# Patient Record
Sex: Male | Born: 1935 | Race: White | Hispanic: No | Marital: Married | Smoking: Never smoker
Health system: Southern US, Community
[De-identification: ages and names within clinical notes are randomized; demographics above are authoritative.]

## PROBLEM LIST (undated history)

## (undated) DIAGNOSIS — E162 Hypoglycemia, unspecified: Secondary | ICD-10-CM

## (undated) DIAGNOSIS — D649 Anemia, unspecified: Secondary | ICD-10-CM

## (undated) DIAGNOSIS — I639 Cerebral infarction, unspecified: Secondary | ICD-10-CM

## (undated) DIAGNOSIS — I313 Pericardial effusion (noninflammatory): Secondary | ICD-10-CM

## (undated) DIAGNOSIS — I739 Peripheral vascular disease, unspecified: Secondary | ICD-10-CM

## (undated) DIAGNOSIS — I10 Essential (primary) hypertension: Secondary | ICD-10-CM

## (undated) DIAGNOSIS — N183 Chronic kidney disease, stage 3 (moderate): Secondary | ICD-10-CM

## (undated) DIAGNOSIS — I3139 Other pericardial effusion (noninflammatory): Secondary | ICD-10-CM

## (undated) DIAGNOSIS — E119 Type 2 diabetes mellitus without complications: Secondary | ICD-10-CM

## (undated) HISTORY — DX: Pericardial effusion (noninflammatory): I31.3

## (undated) HISTORY — PX: LEG SURGERY: SHX1003

## (undated) HISTORY — DX: Peripheral vascular disease, unspecified: I73.9

## (undated) HISTORY — DX: Other pericardial effusion (noninflammatory): I31.39

## (undated) HISTORY — PX: FOOT SURGERY: SHX648

## (undated) SURGERY — Surgical Case
Anesthesia: *Unknown

---

## 2007-05-30 ENCOUNTER — Emergency Department (HOSPITAL_COMMUNITY): Admission: EM | Admit: 2007-05-30 | Discharge: 2007-05-30 | Payer: Self-pay | Admitting: Emergency Medicine

## 2010-02-04 ENCOUNTER — Encounter: Admission: RE | Admit: 2010-02-04 | Discharge: 2010-02-04 | Payer: Self-pay | Admitting: Cardiovascular Disease

## 2010-02-10 ENCOUNTER — Inpatient Hospital Stay (HOSPITAL_COMMUNITY): Admission: RE | Admit: 2010-02-10 | Discharge: 2010-02-11 | Payer: Self-pay | Admitting: Cardiovascular Disease

## 2010-06-14 LAB — CBC
MCHC: 33.5 g/dL (ref 30.0–36.0)
Platelets: 142 10*3/uL — ABNORMAL LOW (ref 150–400)
RDW: 14 % (ref 11.5–15.5)
WBC: 7.8 10*3/uL (ref 4.0–10.5)

## 2010-06-14 LAB — BASIC METABOLIC PANEL
BUN: 9 mg/dL (ref 6–23)
Calcium: 8.4 mg/dL (ref 8.4–10.5)
Creatinine, Ser: 1.12 mg/dL (ref 0.4–1.5)
GFR calc non Af Amer: >60 mL/min (ref >60)
Glucose, Bld: 122 mg/dL — ABNORMAL HIGH (ref 70–99)

## 2010-06-14 LAB — GLUCOSE, CAPILLARY
Glucose-Capillary: 114 mg/dL — ABNORMAL HIGH (ref 70–99)
Glucose-Capillary: 122 mg/dL — ABNORMAL HIGH (ref 70–99)
Glucose-Capillary: 130 mg/dL — ABNORMAL HIGH (ref 70–99)
Glucose-Capillary: 133 mg/dL — ABNORMAL HIGH (ref 70–99)
Glucose-Capillary: 158 mg/dL — ABNORMAL HIGH (ref 70–99)

## 2010-06-23 ENCOUNTER — Other Ambulatory Visit: Payer: Self-pay | Admitting: Podiatry

## 2010-06-23 DIAGNOSIS — M869 Osteomyelitis, unspecified: Secondary | ICD-10-CM

## 2010-06-29 ENCOUNTER — Encounter (HOSPITAL_COMMUNITY)
Admission: RE | Admit: 2010-06-29 | Discharge: 2010-06-29 | Disposition: A | Discharge status: Home / Self Care | Payer: Medicare Other | Source: Ambulatory Visit | Attending: Podiatry | Admitting: Podiatry

## 2010-06-29 ENCOUNTER — Ambulatory Visit (HOSPITAL_COMMUNITY): Payer: Medicare Other

## 2010-06-29 DIAGNOSIS — M869 Osteomyelitis, unspecified: Secondary | ICD-10-CM

## 2010-06-30 ENCOUNTER — Emergency Department (HOSPITAL_COMMUNITY): Payer: Medicare Other

## 2010-06-30 ENCOUNTER — Inpatient Hospital Stay (HOSPITAL_COMMUNITY)
Admission: EM | Admit: 2010-06-30 | Discharge: 2010-07-10 | DRG: 982 | Disposition: A | Discharge status: Home Health | Payer: Medicare Other | Attending: Family Medicine | Admitting: Family Medicine

## 2010-06-30 DIAGNOSIS — Z8673 Personal history of transient ischemic attack (TIA), and cerebral infarction without residual deficits: Secondary | ICD-10-CM

## 2010-06-30 DIAGNOSIS — Q211 Atrial septal defect: Secondary | ICD-10-CM

## 2010-06-30 DIAGNOSIS — E669 Obesity, unspecified: Secondary | ICD-10-CM | POA: Diagnosis present

## 2010-06-30 DIAGNOSIS — I728 Aneurysm of other specified arteries: Secondary | ICD-10-CM | POA: Diagnosis present

## 2010-06-30 DIAGNOSIS — I771 Stricture of artery: Secondary | ICD-10-CM | POA: Diagnosis present

## 2010-06-30 DIAGNOSIS — D509 Iron deficiency anemia, unspecified: Secondary | ICD-10-CM | POA: Diagnosis present

## 2010-06-30 DIAGNOSIS — S92009A Unspecified fracture of unspecified calcaneus, initial encounter for closed fracture: Secondary | ICD-10-CM | POA: Diagnosis present

## 2010-06-30 DIAGNOSIS — I129 Hypertensive chronic kidney disease with stage 1 through stage 4 chronic kidney disease, or unspecified chronic kidney disease: Secondary | ICD-10-CM | POA: Diagnosis present

## 2010-06-30 DIAGNOSIS — E1149 Type 2 diabetes mellitus with other diabetic neurological complication: Secondary | ICD-10-CM | POA: Diagnosis present

## 2010-06-30 DIAGNOSIS — L97409 Non-pressure chronic ulcer of unspecified heel and midfoot with unspecified severity: Secondary | ICD-10-CM | POA: Diagnosis present

## 2010-06-30 DIAGNOSIS — E785 Hyperlipidemia, unspecified: Secondary | ICD-10-CM | POA: Diagnosis present

## 2010-06-30 DIAGNOSIS — E7800 Pure hypercholesterolemia, unspecified: Secondary | ICD-10-CM | POA: Diagnosis present

## 2010-06-30 DIAGNOSIS — I6340 Cerebral infarction due to embolism of unspecified cerebral artery: Principal | ICD-10-CM | POA: Diagnosis present

## 2010-06-30 DIAGNOSIS — N181 Chronic kidney disease, stage 1: Secondary | ICD-10-CM | POA: Diagnosis present

## 2010-06-30 DIAGNOSIS — Z87891 Personal history of nicotine dependence: Secondary | ICD-10-CM

## 2010-06-30 DIAGNOSIS — Z7982 Long term (current) use of aspirin: Secondary | ICD-10-CM

## 2010-06-30 DIAGNOSIS — E1142 Type 2 diabetes mellitus with diabetic polyneuropathy: Secondary | ICD-10-CM | POA: Diagnosis present

## 2010-06-30 DIAGNOSIS — E1169 Type 2 diabetes mellitus with other specified complication: Secondary | ICD-10-CM | POA: Diagnosis not present

## 2010-06-30 DIAGNOSIS — E1159 Type 2 diabetes mellitus with other circulatory complications: Secondary | ICD-10-CM | POA: Diagnosis present

## 2010-06-30 DIAGNOSIS — L02619 Cutaneous abscess of unspecified foot: Secondary | ICD-10-CM | POA: Diagnosis present

## 2010-06-30 DIAGNOSIS — Q2111 Secundum atrial septal defect: Secondary | ICD-10-CM

## 2010-06-30 DIAGNOSIS — X58XXXA Exposure to other specified factors, initial encounter: Secondary | ICD-10-CM | POA: Diagnosis present

## 2010-06-30 DIAGNOSIS — F1011 Alcohol abuse, in remission: Secondary | ICD-10-CM | POA: Diagnosis present

## 2010-06-30 LAB — COMPREHENSIVE METABOLIC PANEL
ALT: 25 U/L (ref 0–53)
AST: 30 U/L (ref 0–37)
Albumin: 2.6 g/dL — ABNORMAL LOW (ref 3.5–5.2)
Calcium: 9.4 mg/dL (ref 8.4–10.5)
GFR calc Af Amer: 51 mL/min — ABNORMAL LOW (ref >60)
Glucose, Bld: 147 mg/dL — ABNORMAL HIGH (ref 70–99)
Potassium: 4.3 mEq/L (ref 3.5–5.1)
Sodium: 135 mEq/L (ref 135–145)
Total Protein: 7.7 g/dL (ref 6.0–8.3)

## 2010-06-30 LAB — DIFFERENTIAL
Basophils Absolute: 0 10*3/uL (ref 0.0–0.1)
Eosinophils Absolute: 0.1 10*3/uL (ref 0.0–0.7)
Eosinophils Relative: 1 % (ref 0–5)
Eosinophils Relative: 1 % (ref 0–5)
Lymphocytes Relative: 15 % (ref 12–46)
Lymphocytes Relative: 14 % (ref 12–46)
Lymphs Abs: 1.4 10*3/uL (ref 0.7–4.0)
Monocytes Absolute: 0.7 10*3/uL (ref 0.1–1.0)
Monocytes Absolute: 0.9 10*3/uL (ref 0.1–1.0)
Monocytes Relative: 7 % (ref 3–12)
Monocytes Relative: 8 % (ref 3–12)
Neutro Abs: 8.2 10*3/uL — ABNORMAL HIGH (ref 1.7–7.7)

## 2010-06-30 LAB — CBC
HCT: 32.7 % — ABNORMAL LOW (ref 39.0–52.0)
HCT: 32.6 % — ABNORMAL LOW (ref 39.0–52.0)
Hemoglobin: 11.2 g/dL — ABNORMAL LOW (ref 13.0–17.0)
MCH: 27.5 pg (ref 26.0–34.0)
MCHC: 32.4 g/dL (ref 30.0–36.0)
MCHC: 34.4 g/dL (ref 30.0–36.0)
MCV: 80.1 fL (ref 78.0–100.0)
MCV: 79.9 fL (ref 78.0–100.0)
Platelets: 280 10*3/uL (ref 150–400)
Platelets: 288 10*3/uL (ref 150–400)
RDW: 13.5 % (ref 11.5–15.5)
RDW: 13.5 % (ref 11.5–15.5)
WBC: 9.6 10*3/uL (ref 4.0–10.5)

## 2010-06-30 LAB — URINALYSIS, ROUTINE W REFLEX MICROSCOPIC
Bilirubin Urine: NEGATIVE
Hgb urine dipstick: NEGATIVE
Nitrite: NEGATIVE
Specific Gravity, Urine: 1.016 (ref 1.005–1.030)
pH: 5 (ref 5.0–8.0)

## 2010-06-30 LAB — POCT CARDIAC MARKERS: CKMB, poc: <1 ng/mL — ABNORMAL LOW (ref 1.0–8.0)

## 2010-06-30 LAB — GLUCOSE, CAPILLARY: Glucose-Capillary: 124 mg/dL — ABNORMAL HIGH (ref 70–99)

## 2010-06-30 LAB — CK TOTAL AND CKMB (NOT AT ARMC)
CK, MB: 0.8 ng/mL (ref 0.3–4.0)
Relative Index: INVALID (ref 0.0–2.5)
Total CK: 15 U/L (ref 7–232)

## 2010-06-30 LAB — TROPONIN I: Troponin I: 0.01 ng/mL (ref 0.00–0.06)

## 2010-07-01 ENCOUNTER — Inpatient Hospital Stay (HOSPITAL_COMMUNITY): Payer: Medicare Other

## 2010-07-01 DIAGNOSIS — M7989 Other specified soft tissue disorders: Secondary | ICD-10-CM

## 2010-07-01 LAB — BASIC METABOLIC PANEL
CO2: 25 mEq/L (ref 19–32)
Calcium: 8.9 mg/dL (ref 8.4–10.5)
GFR calc Af Amer: >60 mL/min (ref >60)
GFR calc non Af Amer: 50 mL/min — ABNORMAL LOW (ref >60)
Sodium: 138 mEq/L (ref 135–145)

## 2010-07-01 LAB — SEDIMENTATION RATE: Sed Rate: 109 mm/hr — ABNORMAL HIGH (ref 0–16)

## 2010-07-01 LAB — URINE CULTURE: Culture: NO GROWTH

## 2010-07-01 LAB — GLUCOSE, CAPILLARY
Glucose-Capillary: 125 mg/dL — ABNORMAL HIGH (ref 70–99)
Glucose-Capillary: 103 mg/dL — ABNORMAL HIGH (ref 70–99)
Glucose-Capillary: 116 mg/dL — ABNORMAL HIGH (ref 70–99)

## 2010-07-01 LAB — CBC
HCT: 28.7 % — ABNORMAL LOW (ref 39.0–52.0)
Platelets: 252 10*3/uL (ref 150–400)
RDW: 13.7 % (ref 11.5–15.5)
WBC: 8.5 10*3/uL (ref 4.0–10.5)

## 2010-07-01 LAB — HEMOGLOBIN A1C: Mean Plasma Glucose: 128 mg/dL — ABNORMAL HIGH (ref <117)

## 2010-07-01 LAB — SURGICAL PCR SCREEN: Staphylococcus aureus: NEGATIVE

## 2010-07-01 NOTE — H&P (Signed)
NAME:  JARRIUS, HUARACHA NO.:  [id number]  MEDICAL RECORD NO.:  [id number]           PATIENT TYPE:  E  LOCATION:  [facility]                         FACILITY:  [facility]  PHYSICIAN:  Zannie Cove, MD     DATE OF BIRTH:  1933-10-03  DATE OF ADMISSION:  06/30/2010 DATE OF DISCHARGE:                             HISTORY & PHYSICAL   PRIMARY CARE PHYSICIAN:  Unknown at this point, unable to recall.  CHIEF COMPLAINT:  Sent by podiatrist office for admission due to worsening of his foot ulcer and swelling.  HISTORY OF PRESENT ILLNESS:  Mr. Mott is a 77 year old diabetic male with peripheral vascular disease who has had a stent in his SFA in November 2011 by Dr. Allyson Sabal who was followed by his podiatrist, Dr. Wynelle Cleveland.  For his left foot heel ulcer, went to see his podiatrist today for regular followup, and he was found to have worsening of the swelling as well as erythema and edema in his foot as well as skin tears and peeling of the overlying skin and hence sent to the ER for further evaluation and management.  In the ER, he apparently got a dose of fentanyl 25 mcg, then shortly soon after was noted to be acutely confused which prompted the ER to call the code stroke.  He also had a CT head which was unremarkable and CBG which was normal as well.  He was seen by Dr. Thad Ranger from Neurology who did not feel that he was having a stroke and rather than it was more of a global confusion.  I do feel that it is most likely related to his IV fentanyl since his mentation has continued to improve with time that he has spent in the ER now.  The patient does report he is still little confused but able to provide some history at this point.  He reports that he has had increasing swelling and pain in his left foot for the last 2 days.  He denies fevers, chills.  He denies history of trauma.  He denies any other complaints at this point in time.  He says that he has been treated with  antibiotics off and on for his left foot by his podiatrist.  He was supposed to have an x-ray done couple of days ago, but due to some technical difficulty with the machine this had to be postponed.  Past medical history is significant for: 1. Type 2 diabetes. 2. Peripheral vascular disease status post two SFA stents in November     2011. 3. Hypertension. 4. History of TIA in the past. 5. History of nonischemic Myoview in October 2011. 6. History of chronic left foot ulcer.  Medications include metformin.  Rest of his medications currently unknown.  Allergies include CODEINE.  SOCIAL HISTORY:  He is married, lives at home with his wife.  Used to drink alcohol regularly up until 15 years ago and smoked one pack per day until 15 years ago.  FAMILY HISTORY:  The patient unable to recall any significant family history at this point.  REVIEW OF SYSTEMS:  Negative except per HPI.  PHYSICAL EXAMINATION:  VITAL SIGNS:  Temperature is 98.1, pulse 95, blood pressure 152/75, respirations 20, sating 100% on 2 L. GENERAL:  He is an elderly Caucasian gentleman laying in the stretcher in no acute distress. HEENT:  Pupils 4 mm bilaterally reactive to light.  Oral mucosa is moist and pink. NECK:  No JVD or lymphadenopathy. CARDIOVASCULAR:  S1 and S2.  Regular rate and rhythm. LUNGS:  Poor air entry bilaterally and decreased breath sounds at bases. ABDOMEN:  Obese, soft, nontender.  Positive bowel sounds.  No organomegaly. EXTREMITIES:  Left lower extremity 2+ edema.  There is erythema overlying the dorsum of the foot.  There is a small less than 1 cm round ulcer on his heel without overt signs of purulence and there is peeling of his skin posterior aspect of his heels.  Dorsalis pedis pulsations are decreased.  I am unable to appreciate this due to excessive overlying edema.  Posterior tibial pulses are present but decreased.  On his right foot, dorsalis pedis pulsations are decreased as  well including posterior tibials. NEUROLOGIC:  Currently, cranial nerves II through XII grossly intact. Motor strength 4/5 in all extremities.  Light touch intact.  Plantars are 2+ in biceps as well as knee, and reflexes are downgoing. Coordination and gait is not assessed.  ASSESSMENT AND PLAN:  Mr. Behney is a 77 year old gentleman with: 1. Worsening cellulitis of the left foot with swelling, rule out     osteomyelitis. 2. Altered mental status and confusion likely related to IV narcotic     administered in the emergency department, currently resolving. 3. Type 2 diabetes. 4. History of peripheral vascular disease status post superficial     femoral artery stents x2.  PLAN: 1. We will start him on IV vancomycin due to the fact that he is     diabetic.  We will also put him on Zosyn to cover for Gram-     negatives and anaerobes.  We will get an MRI of his left foot to     rule out osteomyelitis, and due to the profound edema in his left     leg I will also check Dopplers to rule out DVT. 2. For his diabetes, we will put him on a sliding scale insulin, check     hemoglobin A1c.  We would be rather conservative with his pain     medicines due to his severe reaction in the ED today.  Further     management as condition evolves.     Zannie Cove, MD     PJ/MEDQ  D:  06/30/2010  T:  06/30/2010  Job:  [id number]  cc:   Tinnie Gens A. Petrinitz, D.P.M.  Electronically Signed by Zannie Cove  on 07/01/2010 03:38:07 PM

## 2010-07-02 ENCOUNTER — Inpatient Hospital Stay (HOSPITAL_COMMUNITY): Payer: Medicare Other

## 2010-07-02 LAB — CBC
HCT: 28.6 % — ABNORMAL LOW (ref 39.0–52.0)
MCV: 80.6 fL (ref 78.0–100.0)
Platelets: 249 10*3/uL (ref 150–400)
RBC: 3.55 MIL/uL — ABNORMAL LOW (ref 4.22–5.81)
WBC: 7.4 10*3/uL (ref 4.0–10.5)

## 2010-07-02 LAB — GLUCOSE, CAPILLARY
Glucose-Capillary: 135 mg/dL — ABNORMAL HIGH (ref 70–99)
Glucose-Capillary: 60 mg/dL — ABNORMAL LOW (ref 70–99)
Glucose-Capillary: 96 mg/dL (ref 70–99)

## 2010-07-02 LAB — DIFFERENTIAL
Basophils Absolute: 0 10*3/uL (ref 0.0–0.1)
Lymphocytes Relative: 22 % (ref 12–46)
Lymphs Abs: 1.6 10*3/uL (ref 0.7–4.0)
Neutrophils Relative %: 68 % (ref 43–77)

## 2010-07-02 LAB — LIPID PANEL: VLDL: 12 mg/dL (ref 0–40)

## 2010-07-02 LAB — BASIC METABOLIC PANEL
Calcium: 8.9 mg/dL (ref 8.4–10.5)
GFR calc Af Amer: 56 mL/min — ABNORMAL LOW (ref >60)
GFR calc non Af Amer: 46 mL/min — ABNORMAL LOW (ref >60)
Sodium: 137 mEq/L (ref 135–145)

## 2010-07-02 MED ORDER — GADOBENATE DIMEGLUMINE 529 MG/ML IV SOLN
20.0000 mL | Freq: Once | INTRAVENOUS | Status: AC
  Administered 2010-07-02: 20 mL via INTRAVENOUS

## 2010-07-03 ENCOUNTER — Inpatient Hospital Stay (HOSPITAL_COMMUNITY): Payer: Medicare Other

## 2010-07-03 ENCOUNTER — Other Ambulatory Visit (HOSPITAL_COMMUNITY): Payer: Medicare Other

## 2010-07-03 LAB — DIFFERENTIAL
Eosinophils Absolute: 0.2 10*3/uL (ref 0.0–0.7)
Eosinophils Relative: 4 % (ref 0–5)
Lymphs Abs: 1.5 10*3/uL (ref 0.7–4.0)
Monocytes Relative: 8 % (ref 3–12)

## 2010-07-03 LAB — BASIC METABOLIC PANEL
BUN: 18 mg/dL (ref 6–23)
Chloride: 104 mEq/L (ref 96–112)
Creatinine, Ser: 1.48 mg/dL (ref 0.4–1.5)

## 2010-07-03 LAB — CBC
MCH: 26.7 pg (ref 26.0–34.0)
MCV: 81.1 fL (ref 78.0–100.0)
Platelets: 223 10*3/uL (ref 150–400)
RBC: 3.6 MIL/uL — ABNORMAL LOW (ref 4.22–5.81)

## 2010-07-03 LAB — GLUCOSE, CAPILLARY
Glucose-Capillary: 85 mg/dL (ref 70–99)
Glucose-Capillary: 149 mg/dL — ABNORMAL HIGH (ref 70–99)
Glucose-Capillary: 131 mg/dL — ABNORMAL HIGH (ref 70–99)
Glucose-Capillary: 109 mg/dL — ABNORMAL HIGH (ref 70–99)

## 2010-07-03 LAB — VANCOMYCIN, TROUGH: Vancomycin Tr: 18.7 ug/mL (ref 10.0–20.0)

## 2010-07-03 MED ORDER — GADOBENATE DIMEGLUMINE 529 MG/ML IV SOLN
20.0000 mL | Freq: Once | INTRAVENOUS | Status: AC
  Administered 2010-07-03: 20 mL via INTRAVENOUS

## 2010-07-04 ENCOUNTER — Inpatient Hospital Stay (HOSPITAL_COMMUNITY): Payer: Medicare Other

## 2010-07-04 DIAGNOSIS — I6350 Cerebral infarction due to unspecified occlusion or stenosis of unspecified cerebral artery: Secondary | ICD-10-CM

## 2010-07-04 LAB — GLUCOSE, CAPILLARY
Glucose-Capillary: 118 mg/dL — ABNORMAL HIGH (ref 70–99)
Glucose-Capillary: 135 mg/dL — ABNORMAL HIGH (ref 70–99)

## 2010-07-04 MED ORDER — GADOBENATE DIMEGLUMINE 529 MG/ML IV SOLN
20.0000 mL | Freq: Once | INTRAVENOUS | Status: AC
  Administered 2010-07-04: 20 mL via INTRAVENOUS

## 2010-07-05 LAB — BASIC METABOLIC PANEL
BUN: 18 mg/dL (ref 6–23)
Calcium: 8.5 mg/dL (ref 8.4–10.5)
Chloride: 104 mEq/L (ref 96–112)
Creatinine, Ser: 1.42 mg/dL (ref 0.4–1.5)
GFR calc Af Amer: 59 mL/min — ABNORMAL LOW (ref >60)

## 2010-07-05 LAB — GLUCOSE, CAPILLARY

## 2010-07-05 LAB — CBC
MCH: 27 pg (ref 26.0–34.0)
MCHC: 33.2 g/dL (ref 30.0–36.0)
MCV: 81.4 fL (ref 78.0–100.0)
Platelets: 230 10*3/uL (ref 150–400)
RBC: 3.66 MIL/uL — ABNORMAL LOW (ref 4.22–5.81)

## 2010-07-05 NOTE — Consult Note (Signed)
NAME:  [name], [name] NO.:  [id number]  MEDICAL RECORD NO.:  [id number]           PATIENT TYPE:  I  LOCATION:  3017                         FACILITY:  [facility]  PHYSICIAN:  Thana Farr, MD    DATE OF BIRTH:  [birth year]  DATE OF CONSULTATION:  06/30/2010 DATE OF DISCHARGE:                                CONSULTATION   HISTORY:  Mr. [name] is a 77 year old male with a history of stroke in the past who was triaged to the ED earlier today secondary to some wound issues.  Was last seen normal at approximately 5 o'clock.  On next evaluation in the ED, he was noted to have mental status changes.  The patient was confused.  Code stroke was called at that time.  PAST MEDICAL HISTORY:  Diabetes.  MEDICATIONS:  Metformin, questionable Plavix, and aspirin.  ALLERGIES:  CODEINE.  SOCIAL HISTORY:  Unknown.  PHYSICAL EXAMINATION:  GENERAL:  Blood pressure 152/75, heart rate 110, respiratory rate 22, temperature 98.1, O2 sat 100% on room air. NEUROLOGIC:  On mental status testing, the patient is alert.  He is lying in the bed with his eyes closed.  He does not follow commands.  He has one to two word answers to questions.  On many occasions, his answers are not appropriate to the question being asked. On cranial nerve testing II, the patient would not allow his disks to be visualized.  III, IV, VI:  Extraocular movements grossly intact.  V and VII, face symmetric.  VIII grossly intact.  IX and X, positive gag.  XI unable to test.  XII unable to test. On motor testing, the patient moves all extremities although not to command.  He is able to move all extremities against gravity. On sensory testing, the patient responds to noxious stimuli in all extremities.  Deep tendon reflexes are 1+ in the upper extremities and absent in the lower extremities.  Plantars are mute on the left and upgoing on the right.  LABORATORY TESTING:  White blood cell count of 9.6, platelet  count of 260, hemoglobin and hematocrit 10.6 and 32.7 respectively.  Glucose 156.  CT shows old lacunar changes.  No evidence of acute changes.  These films have been reviewed.  ASSESSMENT:  Mr. [name] is a 77 year old male admitted with acute mental status changes.  Although stroke is on the differential, I am not convinced at this time that this patient has had a stroke.  There is no focality on his exam.  Due to some question whether the patient has actually had a stroke, he will not be administered tissue plasminogen activator.  His NIH stroke scale is a 5.  Further workup is indicated.  PLAN: 1. MRI of the brain without contrast. 2. Echo and carotids. 3. It is unclear if the patient is on an anticoagulant.  There is some     information that the patient may be on aspirin and Plavix.  If it     is determined that he is on aspirin and Plavix, we will continue     that at this time.  If he is not on anticoagulant,  we will start     aspirin prophylaxis for now.  Unable to get any accurate     information from the patient at this time.          ______________________________ Thana Farr, MD  Patient is critically ill and requires high level decision making for patient care.     LR/MEDQ  D:  06/30/2010  T:  07/01/2010  Job:  [id number]  Electronically Signed by Thana Farr MD on 07/05/2010 07:00:31 PM

## 2010-07-06 DIAGNOSIS — M79609 Pain in unspecified limb: Secondary | ICD-10-CM

## 2010-07-06 LAB — COMPREHENSIVE METABOLIC PANEL
ALT: 34 U/L (ref 0–53)
AST: 28 U/L (ref 0–37)
Albumin: 2.5 g/dL — ABNORMAL LOW (ref 3.5–5.2)
Alkaline Phosphatase: 67 U/L (ref 39–117)
Potassium: 3.9 mEq/L (ref 3.5–5.1)
Sodium: 140 mEq/L (ref 135–145)
Total Protein: 7 g/dL (ref 6.0–8.3)

## 2010-07-06 LAB — CBC
Platelets: 236 10*3/uL (ref 150–400)
RBC: 3.83 MIL/uL — ABNORMAL LOW (ref 4.22–5.81)
RDW: 14.3 % (ref 11.5–15.5)
WBC: 6.3 10*3/uL (ref 4.0–10.5)

## 2010-07-06 LAB — POCT I-STAT 4, (NA,K, GLUC, HGB,HCT)
Glucose, Bld: 110 mg/dL — ABNORMAL HIGH (ref 70–99)
HCT: 30 % — ABNORMAL LOW (ref 39.0–52.0)
Hemoglobin: 10.2 g/dL — ABNORMAL LOW (ref 13.0–17.0)

## 2010-07-06 LAB — GLUCOSE, CAPILLARY
Glucose-Capillary: 106 mg/dL — ABNORMAL HIGH (ref 70–99)
Glucose-Capillary: 137 mg/dL — ABNORMAL HIGH (ref 70–99)

## 2010-07-06 LAB — HOMOCYSTEINE: Homocysteine: 13.8 umol/L (ref 4.0–15.4)

## 2010-07-07 LAB — CBC
HCT: 30.4 % — ABNORMAL LOW (ref 39.0–52.0)
MCHC: 32.6 g/dL (ref 30.0–36.0)
MCV: 81.1 fL (ref 78.0–100.0)
RDW: 14.4 % (ref 11.5–15.5)
WBC: 6.1 10*3/uL (ref 4.0–10.5)

## 2010-07-07 LAB — BASIC METABOLIC PANEL
BUN: 17 mg/dL (ref 6–23)
GFR calc non Af Amer: 39 mL/min — ABNORMAL LOW (ref >60)
Glucose, Bld: 110 mg/dL — ABNORMAL HIGH (ref 70–99)
Potassium: 3.8 mEq/L (ref 3.5–5.1)

## 2010-07-07 LAB — PROTEIN S ACTIVITY: Protein S Activity: 114 % (ref 69–129)

## 2010-07-07 LAB — PROTEIN C, TOTAL: Protein C, Total: 118 % (ref 72–160)

## 2010-07-07 LAB — PROTHROMBIN GENE MUTATION

## 2010-07-07 LAB — GLUCOSE, CAPILLARY: Glucose-Capillary: 162 mg/dL — ABNORMAL HIGH (ref 70–99)

## 2010-07-07 LAB — PROTEIN C ACTIVITY: Protein C Activity: 146 % — ABNORMAL HIGH (ref 75–133)

## 2010-07-07 LAB — PROTEIN S, TOTAL: Protein S Ag, Total: 139 % (ref 60–150)

## 2010-07-07 LAB — ABO/RH: ABO/RH(D): O POS

## 2010-07-07 LAB — LUPUS ANTICOAGULANT PANEL: Lupus Anticoagulant: NOT DETECTED

## 2010-07-08 LAB — COMPREHENSIVE METABOLIC PANEL
Albumin: 2.4 g/dL — ABNORMAL LOW (ref 3.5–5.2)
BUN: 20 mg/dL (ref 6–23)
Creatinine, Ser: 1.64 mg/dL — ABNORMAL HIGH (ref 0.4–1.5)
Glucose, Bld: 122 mg/dL — ABNORMAL HIGH (ref 70–99)
Total Protein: 6.2 g/dL (ref 6.0–8.3)

## 2010-07-08 LAB — BETA-2-GLYCOPROTEIN I ABS, IGG/M/A
Beta-2-Glycoprotein I IgA: 7 A Units (ref <20)
Beta-2-Glycoprotein I IgM: 5 M Units (ref <20)

## 2010-07-08 LAB — CBC
HCT: 29.9 % — ABNORMAL LOW (ref 39.0–52.0)
MCH: 26.6 pg (ref 26.0–34.0)
MCHC: 32.4 g/dL (ref 30.0–36.0)
MCV: 82.1 fL (ref 78.0–100.0)
Platelets: 217 10*3/uL (ref 150–400)
RDW: 14.6 % (ref 11.5–15.5)
WBC: 6.1 10*3/uL (ref 4.0–10.5)

## 2010-07-08 LAB — IRON AND TIBC
Iron: 45 ug/dL (ref 42–135)
Saturation Ratios: 23 % (ref 20–55)
TIBC: 193 ug/dL — ABNORMAL LOW (ref 215–435)

## 2010-07-08 LAB — GLUCOSE, CAPILLARY: Glucose-Capillary: 172 mg/dL — ABNORMAL HIGH (ref 70–99)

## 2010-07-08 LAB — CARDIOLIPIN ANTIBODIES, IGG, IGM, IGA
Anticardiolipin IgA: 12 APL U/mL — ABNORMAL LOW (ref <22)
Anticardiolipin IgM: 3 MPL U/mL — ABNORMAL LOW (ref <11)

## 2010-07-08 LAB — VITAMIN B12: Vitamin B-12: 385 pg/mL (ref 211–911)

## 2010-07-08 LAB — FERRITIN: Ferritin: 228 ng/mL (ref 22–322)

## 2010-07-09 LAB — CBC
HCT: 29.1 % — ABNORMAL LOW (ref 39.0–52.0)
Hemoglobin: 9.6 g/dL — ABNORMAL LOW (ref 13.0–17.0)
MCH: 27 pg (ref 26.0–34.0)
MCHC: 33 g/dL (ref 30.0–36.0)
RDW: 14.7 % (ref 11.5–15.5)

## 2010-07-09 LAB — GLUCOSE, CAPILLARY
Glucose-Capillary: 126 mg/dL — ABNORMAL HIGH (ref 70–99)
Glucose-Capillary: 114 mg/dL — ABNORMAL HIGH (ref 70–99)

## 2010-07-09 LAB — HEMOCCULT GUIAC POC 1CARD (OFFICE)
Fecal Occult Bld: NEGATIVE
Fecal Occult Bld: NEGATIVE

## 2010-07-10 LAB — CROSSMATCH: Unit division: 0

## 2010-07-10 LAB — CULTURE, BLOOD (ROUTINE X 2)
Culture  Setup Time: 201204022347
Culture: NO GROWTH

## 2010-07-10 LAB — GLUCOSE, CAPILLARY: Glucose-Capillary: 119 mg/dL — ABNORMAL HIGH (ref 70–99)

## 2010-07-10 NOTE — Op Note (Signed)
  NAME:  [name], [name]             ACCOUNT NO.:  [id number]  MEDICAL RECORD NO.:  [id number]           PATIENT TYPE:  I  LOCATION:  3034                         FACILITY:  [facility]  PHYSICIAN:  Nadara Mustard, MD     DATE OF BIRTH:  Jun 13, 1933  DATE OF PROCEDURE:  07/08/2010 DATE OF DISCHARGE:                              OPERATIVE REPORT   PREOPERATIVE DIAGNOSIS:  Left calcaneus fracture.  POSTOPERATIVE DIAGNOSIS:  Left calcaneus fracture.  PROCEDURE:  Open reduction and internal fixation of left calcaneus.  SURGEON:  Nadara Mustard, MD  ANESTHESIA:  Popliteal block.  ESTIMATED BLOOD LOSS:  Minimal.  ANTIBIOTICS:  Vancomycin and Zosyn preoperatively.  DRAINS:  None.  COMPLICATIONS:  None.  TOURNIQUET TIME:  None.  DISPOSITION:  To PACU in stable condition.  INDICATION FOR PROCEDURE:  The patient is a 77 year old gentleman, diabetic insensate neuropathy, status post a tongue-type fracture with avulsion of the tibial tuberosity with elevation.  The patient has redness with pending skin ulceration.  The fracture was not reduced. The patient was scheduled for surgery last week.  He was unresponsive. He has undergone prolonged medical workup and is felt to be cleared for surgery at this time by the medical team.  The patient was not allowed to be operated on prior to this time.  Risks and benefits were discussed including infection, neurovascular injury, ischemia of the skin over the os calcis, potential for amputation.  The patient states he understands and wished to proceed at this time.  DESCRIPTION OF PROCEDURE:  The patient was brought to OR room #4 and underwent a popliteal block.  After adequate level of anesthesia was obtained, the patient was placed in the right lateral decubitus position with the left side up and left lower extremity was prepped using DuraPrep and draped into sterile field.  A percutaneous hole was made superior to the os calcis using a ball-tip  reduction tool.  The elevated os calcis was reduced.  Two crossing K-wires were then used to stabilize the reduction.  C-arm fluoroscopy verified the reduction at this time. Over the K-wires, two 7.3 cannulated screws were then used to stabilize reduction.  The posterior facet was reduced.  The wounds were closed using 2-0 nylon.  There was no tension on the skin.  The wound was covered with Adaptic orthopedic sponges, Kerlix, and Coban.  The patient was then taken to PACU in stable condition.     Nadara Mustard, MD     MVD/MEDQ  D:  07/08/2010  T:  07/09/2010  Job:  [id number]  Electronically Signed by Aldean Baker MD on 07/10/2010 08:13:05 PM

## 2010-07-11 NOTE — Discharge Summary (Signed)
NAME:  [name], [name]             ACCOUNT NO.:  [id number]  MEDICAL RECORD NO.:  [id number]           PATIENT TYPE:  I  LOCATION:  3034                         FACILITY:  [facility]  PHYSICIAN:  Pleas Koch, MD        DATE OF BIRTH:  May 08, 1933  DATE OF ADMISSION:  06/30/2010 DATE OF DISCHARGE:  07/10/2010                              DISCHARGE SUMMARY   Please see admission H&P [id number] dated June 30, 2010.  DISCHARGE DIAGNOSES: 1. Calcaneal fracture. 2. 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 3. Left heel ulcer. 4. Impaired fasting glucose of A1c 6.1. 5. History of peripheral vascular disease. 6. Microcytic anemia. 7. Hyperlipidemia. 8. CKD stage I.  DISCHARGE MEDICATIONS:  Are as follows. 1. The patient is to continue Diovan/HCTZ 160/12.5 daily. 2. Metformin 500 one tablets b.i.d. 3. Amlodipine 10 mg daily as new medication. 4. Simvastatin 10 mg at bedtime new medications. 5. Hydrocodone/APAP 5/325 1 tablet q. 6 p.r.n. limited prescription 5     days given with 20 tablets. 6. Aspirin 325 mg daily lifelong. 7. Clopidogrel 75 mg 1 tablet daily with meals lifelong. 8. Amaryl 4 mg 1 tablet daily.  PERTINENT CONSULTS:  While in hospital: 1. Neurology, Dr. Thad Ranger and Dr. Pearlean Brownie 2. Dr. Lynnea Ferrier. 3. Dr. Aldean Baker of Orthopedics.  PERTINENT IMAGING STUDIES: 1. Two-view chest x-ray initially on day of admission showed no acute     cardiopulmonary process. 2. CT head, June 30, 2010, showed atrophic and chronic vascular     ischemia.  No acute abnormality. 3. CT head without contrast, July 02, 2010, showed stable exam of     atrophy and chronic small vessel ischemic change.  No acute     intracranial abnormalities found. 4. Foot x-ray, July 01, 2010, showed posterior calcaneal fractures     intraarticular extension posterior subtalar joint. 5. Dorsal mid and forefoot soft tissue swelling without evidence of     fracture or osteomyelitis in  the region. 6. MRI brain with and without contrast on July 02, 2010, showed     ectatic distal cervical segment and petrous segment of the right     vertebral artery. 7. Proximal left internal carotid artery with 48% diameter stenosis,     small outpouching representing ulcerations, left proximal internal     carotid artery, right internal carotid with long segment stenosis     with 56% narrowing with 2 areas which may represent focal     ulcerations. 8. Moderate-to-marked focal narrowing proximal aspect of vertebral     arteries bilaterally.  This is common for artifactual loss of     signal although this may be true stenosis in the region which may     be overestimated by present exam.  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 9. MRI of the brain showed spinal stenosis C3-C4 partially, empty     sella, remote hemispheric infarction with involvement of portions     of the frontal lobes bilaterally,  left parietal lobe, remote small     thalamic infarct. 10.Several remote small bilateral cerebellum infarct, one of which is     partially hemorrhagic right cerebellum.  No other intracranial     hemorrhage. 11.Scattered small acute nonhemorrhagic infarcts involving portions of     the occipital lobes bilaterally and mid-to-superior cerebellums     bilaterally. 12.MRI of foot July 03, 2010 showed calcaneal fracture with     considerable edema and enhancements through calcaneus.     Enhancements noting some geographical lack of enhancement along the     fracture joints could be due to healing response, but atypical     feature such as fluid from the fracture apparently extended the     scan, punctate low signal foci within the core enhanced portion of     calcaneus raised my suspicion for superimposed osteomyelitis and     possible open fracture. 13.MRI of tibia-fibular July 03, 2010, showed fatty replacement     muscular of the posterior compartment of left lower leg. 14.Subcutaneous edema in the calves may be incidental given the lack     of associated significant __________ and does not entirely reflects     cellulitis. 15.A 2-D echocardiogram July 04, 2010 showed EF 65% with no regional     wall motion abnormalities.  Doppler parameters consistent with     abnormal left ventricular aspiration grade 1 diastolic dysfunction.     Transesophageal ultrasound showed left ventricular systolic     function 65%.  No regional wall motion abnormalities.  Left atrium,     no evidence of thrombus and atrial cavity or appendage.  Right     atrium, no evidence of thrombus in the atrial cavity or appendage,     atrial septum.  Agitated saline contrast study showed a small right-     to-left shunt through a patent foramen ovale in the baseline study.  HOSPITAL COURSE:  According to history, please see full dictation as dictated.  Briefly this is a 77 year old diabetic male peripheral vascular disease and is SFA by Dr. Audie Pinto, followed by doctor podiatrist, Dr. Wynelle Cleveland.  Per his referral, he went to see podiatrist for regular follow up, found to have worsening swelling and erythema as well as skin tears and pealing of the overlying skin and came to the ED.  The patient was found to be confused subsequent to being in the ED although the patient had received fentanyl.  The patient was seen by Neurology who did not feel he was having stroke and thought it was more global confusion.  The patient does report he is little bit more confused, but was able to provide some history and states he had increasing swelling left foot over the past 2 days.  No fever, no chills, and no trauma.  He has been treated on and off antibiotics by his podiatrist.  He was supposed to have x-ray done couple days ago, but due to technical difficulty he had to have this postponed.  The  patient does have a TIA history in the past with peripheral vascular disease.  ADMISSION VITAL SIGNS:  Temperature 98.1, pulse 95, blood pressure 150/75, respirations 20, satting 100% on room air. HEENT:  Pupils 4-mm, bilaterally reactive to light. RESPIRATORY:  Air entry was bilaterally, decreased with decreased breath sounds. EXTREMITIES:  There is a small less than 1 cm round ulcer on heel without oversize purulence and there is peeling of skin posterior aspect of heel.  Dorsalis pedis are decreased.  It was difficult to appreciate initially pulse because of excessive overlying edema.  Right foot dorsalis pedis are decreased as well including posterior tibialis. Cranial nerves initially grossly intact.  Muscle strength 4/5 in all extremities.  Light touch was intact.  Plantars are 2+ in biceps and other deep tendon reflexes are normal.  Coordination and gait are not assessed.  HOSPITAL COURSE ACCORDING TO ISSUE: 1. Possible cellulitis.  The patient was initially placed on     vancomycin and Zosyn for empiric coverage and subsequently was     reviewed by orthopedics.  It was felt that the patient likely may     have a calcaneal fracture with overlying cellulitis and hence he     was kept on antibiotics as per imaging findings above.  The patient     went to OR on two episodes, but developed some intermittent     confusion.  This had been cancelled twice.  Subsequently on July 08, 2010, Dr. Lajoyce Corners was able to do an ORIF of left calcaneus which     the patient tolerated well.  The patient will follow up with Dr.     Lajoyce Corners in about 1 week in the outpatient setting. 2. Possible history of embolic CVA.  Dr. Pearlean Brownie was consulted regarding     this.  He recommended and various studies were done for his     intermittent confusion.  He had an EEG that showed diffuse gray     matter disturbance that is etiologically nonspecific, but may     include dementia and other possibilities.  For the  PFO, according     to his assessment and plan as the patient did not have any lower     extremity deep vein thrombosis which may explain paradoxical     emboli.  He recommended full strength aspirin and the patient will     also be on Plavix.  The patient does also carry a history of     peripheral vascular disease and this is felt to be appropriate on     discharge.  Dr. Pearlean Brownie did also state that the patient should follow     up with Dr. Allyson Sabal and may benefit from possible subclavian     angioplasty and stenting in the future secondary to subclavian     stenosis as dictated above. 3. Diabetes mellitus.  The patient's A1c was well-controlled at 6.1.     He was not kept on insulin while in hospital, the patient will     continue his Amaryl and metformin at above doses as p.r.n. on     discharge. 4. History of peripheral vascular disease.  The patient is status post     stenting, status post two SFA stents, November 2011 and will need     to follow up with Dr. Allyson Sabal once again for this. 5. Anemia.  The patient had an microcytic anemia during     hospitalization which remained stable.  Fecal occult blood was     done, which was negative.  He had an anemia panel which showed a     decreased total iron binding capacity only.  This is likely anemia,     most probably blood loss. 6. Hypercholesterolemia.  The patient was placed on a statin.  His LDL     was 82.  His HDL was 25.  The patient may benefit from an exercise  program. 7. Possible hypercoagulable state causing cerebrovascular accident.     The patient had a relatively high protein C level, but on     discussion with Dr. Pearlean Brownie, this was not something that would likely     be the causative factor for the stroke. 8. Questionable cellulitis.  I discuss the patient personally with Dr.     Lupe Carney who did not think that the patient required empiric     oral antibiotic coverage and in fact stated that this was more     likely  secondary to fracture overlying edema rather than actual     cellulitis.  As such, I have discontinued all antibiotics and the     patient will go home without antibiotic coverage as this is more     likely secondary to the fracture than cellulitis.  DISPOSITION:  The patient will be discharged home with home health PT/OT as the patient was able to ambulate and toe touch weightbearing per orthopedics.  The patient was able to ambulate well without difficulty and go to bathroom and toilet himself.  The patient will need follow up with his primary care physician probably in about 1 week and let him know what went on in the hospital.  His podiatrist is Dr. Wynelle Cleveland who sent him over here.  The patient was reviewed on day of discharge.  The patient was discharged in a stable state.  Temperature 97.7, pulse 71, respirations 16, blood pressure 99-133/61-68.  The patient was satting 98% on room air.  It was pleasure taking care of this patient in the hospital.          ______________________________ Pleas Koch, MD     JS/MEDQ  D:  07/10/2010  T:  07/10/2010  Job:  [id number]  cc:   Pramod P. Pearlean Brownie, MD Ritta Slot, MD Nadara Mustard, MD Eugenio Hoes. Petrinitz, D.P.M. Audie Pinto, M.D.  Electronically Signed by Pleas Koch MD on 07/11/2010 06:15:32 AM

## 2010-07-13 NOTE — Discharge Summary (Signed)
  NAME:  CURTEZ, BRALLIER NO.:  [id number]  MEDICAL RECORD NO.:  [id number]           PATIENT TYPE:  LOCATION:                                 FACILITY:  PHYSICIAN:  Pleas Koch, MD        DATE OF BIRTH:  Jun 22, 1933  DATE OF ADMISSION: DATE OF DISCHARGE:                              DISCHARGE SUMMARY   ADDENDUM:  The patient was discharged home in med records on Plavix 75 mg one daily with meals, and it should be noted that the patient is allergic Plavix, and did experience some itching and discomfort with the same, hence he was discontinued off, and discharged home only on 325 mg of aspirin.          ______________________________ Pleas Koch, MD     JS/MEDQ  D:  07/10/2010  T:  07/10/2010  Job:  [id number]  Electronically Signed by Pleas Koch MD on 07/13/2010 07:31:12 AM

## 2010-07-26 NOTE — Consult Note (Signed)
NAME:  [name], [name]             ACCOUNT NO.:  [id number]  MEDICAL RECORD NO.:  [id number]           PATIENT TYPE:  LOCATION:                                 FACILITY:  PHYSICIAN:  Pramod P. Pearlean Brownie, MD    DATE OF BIRTH:  18-Jul-1933  DATE OF CONSULTATION: DATE OF DISCHARGE:                                CONSULTATION   REFERRING PHYSICIAN:  [facility].  REASON FOR REFERRAL:  Stroke.  HISTORY OF PRESENT ILLNESS:  [name] is a 77 year old Caucasian gentleman who was admitted on June 30, 2010, with sudden onset of confusion while he was for a routine followup visit with his podiatrist when he was seen for left foot pain, swelling, and cellulitis following surgery.  The patient was seen in the emergency room and a code stroke by Dr. Thana Farr, neurologist on-call, who thought the patient's symptoms were nonfocal and probably related to medication like fentanyl and he was not given thrombolysis with TPA.  The patient's condition has improved somewhat.  Admission CT scan of the head showed small bilateral lacunar infarcts in cerebellum with changes of small vessel disease without acute abnormality; however, an MRI scan was obtained on July 02, 2010, which shows multiple tiny scattered nonhemorrhagic infarcts involving both occipital lobes and mid to superior cerebellum bilaterally as well as several small remote age cerebellar infarcts and old right frontal left parietal infarcts as well.  MRA of the brain shows mild ectasia of the carotid arteries without high-grade stenosis. 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  The patient has had no further recurrent confusion and near stroke like symptoms.  On inquiry today states that a week ago he had an episode of sudden onset of dizziness, mild  vertigo, as well as right upper extremity numbness and weakness which lasted only for 10 minutes and cleared completely, did not seek medical help at that time.  He himself denies any prior history of strokes even though his CT and MRI reveal multiple small lacunar infarcts bilaterally.  His past medical history is significant for severe peripheral vascular disease status post femoral stents in November 2011 by Dr. Allyson Sabal. Diabetes, hypertension, history of TIAs in the past, chronic left foot ulcer.  MEDICATIONS:  At home, metformin.  In the hospital, Lovenox, hydrochlorothiazide, Benicar, Zosyn, Zocor.  SOCIAL HISTORY:  The patient is married, lives with his wife at home. He used to drink alcohol, quit 15 years ago.  No smoking.  REVIEW OF SYSTEMS:  Documented above in history present illness.  PHYSICAL EXAMINATION:  GENERAL:  Obese middle-aged Caucasian gentleman currently not in distress who is lying comfortably in the bed.  He has swelling in the left foot with left foot brace that he is wearing. VITAL SIGNS:  He is afebrile, temperature 97.9, blood pressure 150/68, pulse rate 75 per minute and regular, respiratory rate 16 per minute, distal pulses are well felt. HEENT:  Head is nontraumatic.  Hearing appears normal. NECK:  Supple without bruit.  CARDIAC:  No murmur or gallop. LUNGS:  Clear to auscultation. ABDOMEN:  Soft, nontender. NEUROLOGIC:  He is awake, alert, oriented to time, place, and person. He has intact speech and language but diminished recall, 0/3.  He is able to name only five animals.  Speech is, however, fluent.  Eye movements are full range without nystagmus.  He blinks to threat bilaterally.  There is no facial weakness.  Tongue is midline.  Motor system exam reveals no upper or lower extremity drift, symmetric and equal strength in all four extremities.  Deep tendon reflexes are 2+ in the upper extremities, 1+ in the knees, absent in the ankles.   Both plantars are downgoing.  Touch pinprick sensation appears preserved except distally in both feet.  Coordination is accurate.  Gait was not tested.  NIH stroke scale now is zero.  MRI scan of the brain as stated above shows multiple tiny small nonhemorrhagic infarcts involving both superior cerebellum and occipital lobes.  Lipid profile shows total cholesterol of 119, triglycerides 62, HDL 25, LDL 82.  Hemoglobin A1c is 6.1.  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  Etiology to be determined, but given the fact that he has chronic nonhealing foot ulcer endocarditis is a possibility.  He also has ulcerative disease in the left subclavian artery with poor stenotic dilatation that could also possibly serve as a source of embolization to the posterior circulation and will need further evaluation.  PLAN:  The patient has a chronic nonhealing ulcer with possible osteomyelitis in the left foot for which he needs emergent surgery.  He may undergo the surgery, but there is an increased risk of having hemorrhagic transformation and extending the stroke during the surgery.I would recommend avoiding hypertension if blood pressure ranging 120- 140 systolic range and the possibly avoid general anesthesia and do surgery under spinal or local anesthesia.  Start aspirin after surgery for secondary stroke prevention.  Transesophageal echocardiogram for endocarditis.  No cardiac source of embolism and subclavian catheter angiogram by Dr. Allyson Sabal to consider subclavian angioplasty and stenting in the future.  Kindly call for questions.     Pramod P. Pearlean Brownie, MD     PPS/MEDQ  D:  07/04/2010  T:  07/05/2010  Job:  [id number]  Electronically Signed by Delia Heady MD on 07/26/2010 11:25:10 AM
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
## 2012-07-25 ENCOUNTER — Emergency Department (HOSPITAL_COMMUNITY): Payer: Medicare Other

## 2012-07-25 ENCOUNTER — Encounter (HOSPITAL_COMMUNITY): Payer: Self-pay | Admitting: Emergency Medicine

## 2012-07-25 ENCOUNTER — Inpatient Hospital Stay (HOSPITAL_COMMUNITY)
Admission: EM | Admit: 2012-07-25 | Discharge: 2012-07-27 | DRG: 639 | Disposition: A | Discharge status: Home / Self Care | Payer: Medicare Other | Attending: Family Medicine | Admitting: Family Medicine

## 2012-07-25 DIAGNOSIS — R404 Transient alteration of awareness: Secondary | ICD-10-CM | POA: Diagnosis present

## 2012-07-25 DIAGNOSIS — Z8673 Personal history of transient ischemic attack (TIA), and cerebral infarction without residual deficits: Secondary | ICD-10-CM

## 2012-07-25 DIAGNOSIS — Z7982 Long term (current) use of aspirin: Secondary | ICD-10-CM

## 2012-07-25 DIAGNOSIS — Z9181 History of falling: Secondary | ICD-10-CM

## 2012-07-25 DIAGNOSIS — E1169 Type 2 diabetes mellitus with other specified complication: Principal | ICD-10-CM | POA: Diagnosis present

## 2012-07-25 DIAGNOSIS — E119 Type 2 diabetes mellitus without complications: Secondary | ICD-10-CM | POA: Diagnosis present

## 2012-07-25 DIAGNOSIS — I129 Hypertensive chronic kidney disease with stage 1 through stage 4 chronic kidney disease, or unspecified chronic kidney disease: Secondary | ICD-10-CM | POA: Diagnosis present

## 2012-07-25 DIAGNOSIS — N179 Acute kidney failure, unspecified: Secondary | ICD-10-CM | POA: Diagnosis present

## 2012-07-25 DIAGNOSIS — R4189 Other symptoms and signs involving cognitive functions and awareness: Secondary | ICD-10-CM | POA: Diagnosis present

## 2012-07-25 DIAGNOSIS — R748 Abnormal levels of other serum enzymes: Secondary | ICD-10-CM | POA: Diagnosis present

## 2012-07-25 DIAGNOSIS — I10 Essential (primary) hypertension: Secondary | ICD-10-CM | POA: Diagnosis present

## 2012-07-25 DIAGNOSIS — Z79899 Other long term (current) drug therapy: Secondary | ICD-10-CM

## 2012-07-25 DIAGNOSIS — E162 Hypoglycemia, unspecified: Secondary | ICD-10-CM | POA: Diagnosis present

## 2012-07-25 DIAGNOSIS — N189 Chronic kidney disease, unspecified: Secondary | ICD-10-CM

## 2012-07-25 DIAGNOSIS — R7989 Other specified abnormal findings of blood chemistry: Secondary | ICD-10-CM | POA: Diagnosis present

## 2012-07-25 HISTORY — DX: Essential (primary) hypertension: I10

## 2012-07-25 HISTORY — DX: Type 2 diabetes mellitus without complications: E11.9

## 2012-07-25 LAB — POCT I-STAT TROPONIN I

## 2012-07-25 LAB — CBC WITH DIFFERENTIAL/PLATELET
Basophils Relative: 0 % (ref 0–1)
Hemoglobin: 10.7 g/dL — ABNORMAL LOW (ref 13.0–17.0)
Lymphs Abs: 0.8 10*3/uL (ref 0.7–4.0)
MCHC: 35.7 g/dL (ref 30.0–36.0)
Monocytes Relative: 6 % (ref 3–12)
Neutro Abs: 5 10*3/uL (ref 1.7–7.7)
Neutrophils Relative %: 81 % — ABNORMAL HIGH (ref 43–77)
Platelets: 134 10*3/uL — ABNORMAL LOW (ref 150–400)
RBC: 3.63 MIL/uL — ABNORMAL LOW (ref 4.22–5.81)

## 2012-07-25 LAB — COMPREHENSIVE METABOLIC PANEL
ALT: 21 U/L (ref 0–53)
Albumin: 2.9 g/dL — ABNORMAL LOW (ref 3.5–5.2)
Alkaline Phosphatase: 88 U/L (ref 39–117)
BUN: 40 mg/dL — ABNORMAL HIGH (ref 6–23)
Chloride: 106 mEq/L (ref 96–112)
Potassium: 3.9 mEq/L (ref 3.5–5.1)
Sodium: 136 mEq/L (ref 135–145)
Total Bilirubin: 0.3 mg/dL (ref 0.3–1.2)

## 2012-07-25 LAB — URINE MICROSCOPIC-ADD ON

## 2012-07-25 LAB — GLUCOSE, CAPILLARY
Glucose-Capillary: 20 mg/dL — CL (ref 70–99)
Glucose-Capillary: 56 mg/dL — ABNORMAL LOW (ref 70–99)

## 2012-07-25 LAB — URINALYSIS, ROUTINE W REFLEX MICROSCOPIC
Glucose, UA: NEGATIVE mg/dL
Specific Gravity, Urine: 1.013 (ref 1.005–1.030)
pH: 5 (ref 5.0–8.0)

## 2012-07-25 MED ORDER — DEXTROSE 50 % IV SOLN
50.0000 mL | Freq: Once | INTRAVENOUS | Status: AC
  Administered 2012-07-25: 50 mL via INTRAVENOUS

## 2012-07-25 MED ORDER — DEXTROSE 50 % IV SOLN
INTRAVENOUS | Status: AC
  Filled 2012-07-25: qty 50

## 2012-07-25 MED ORDER — DEXTROSE 10 % IV SOLN
INTRAVENOUS | Status: DC
  Administered 2012-07-25: 21:00:00 via INTRAVENOUS

## 2012-07-25 MED ORDER — DEXTROSE 50 % IV SOLN
INTRAVENOUS | Status: AC
  Administered 2012-07-25: 50 mL via INTRAVENOUS
  Filled 2012-07-25: qty 50

## 2012-07-25 MED ORDER — ONDANSETRON 4 MG PO TBDP
4.0000 mg | ORAL_TABLET | Freq: Once | ORAL | Status: AC
  Administered 2012-07-25: 4 mg via ORAL
  Filled 2012-07-25: qty 1

## 2012-07-25 MED ORDER — DEXTROSE 10 % NICU IV FLUID BOLUS: 2.0000 mL/kg | INJECTION | Freq: Once | INTRAVENOUS | Status: DC

## 2012-07-25 MED ORDER — DEXTROSE 50 % IV SOLN
1.0000 | Freq: Once | INTRAVENOUS | Status: AC
  Administered 2012-07-25: 50 mL via INTRAVENOUS

## 2012-07-25 NOTE — ED Notes (Signed)
Patient's CBG 56, amp of D50 Given, meal given.  Patient a&ox4, denies pain.  Will continue to monitor.

## 2012-07-25 NOTE — ED Notes (Signed)
Patient from home via [facility] with hypoglycemia and a fall.  Per EMS patient fell and blood sugar was initially 37.  Per paramedic family aided patient to ground and alert and oriented but lethargic on arrival. Patient has a laceration to right elbow.   EMS started D10 in the field, patient received a total of 250 mL en route and CBG is currently 108.   Patient is stable at this time and A&Ox4.  Will continue to monitor.
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
## 2012-07-25 NOTE — ED Provider Notes (Signed)
8:16 PM  Date: 07/25/2012  Rate: 105  Rhythm: sinus tachycardia  QRS Axis: normal  Intervals: normal  ST/T Wave abnormalities: ST depressions laterally  Conduction Disutrbances:none  Narrative Interpretation: Borderline EKG  Old EKG Reviewed: none available    Carleene Cooper III, MD 07/25/12 2017

## 2012-07-26 DIAGNOSIS — E162 Hypoglycemia, unspecified: Secondary | ICD-10-CM

## 2012-07-26 DIAGNOSIS — R404 Transient alteration of awareness: Secondary | ICD-10-CM | POA: Diagnosis present

## 2012-07-26 DIAGNOSIS — E119 Type 2 diabetes mellitus without complications: Secondary | ICD-10-CM

## 2012-07-26 DIAGNOSIS — R4189 Other symptoms and signs involving cognitive functions and awareness: Secondary | ICD-10-CM | POA: Diagnosis present

## 2012-07-26 DIAGNOSIS — N189 Chronic kidney disease, unspecified: Secondary | ICD-10-CM

## 2012-07-26 DIAGNOSIS — R7989 Other specified abnormal findings of blood chemistry: Secondary | ICD-10-CM

## 2012-07-26 DIAGNOSIS — Z9181 History of falling: Secondary | ICD-10-CM

## 2012-07-26 DIAGNOSIS — I10 Essential (primary) hypertension: Secondary | ICD-10-CM

## 2012-07-26 DIAGNOSIS — I319 Disease of pericardium, unspecified: Secondary | ICD-10-CM

## 2012-07-26 HISTORY — DX: Hypoglycemia, unspecified: E16.2

## 2012-07-26 LAB — CBC
HCT: 29.8 % — ABNORMAL LOW (ref 39.0–52.0)
MCHC: 34.9 g/dL (ref 30.0–36.0)
Platelets: 115 10*3/uL — ABNORMAL LOW (ref 150–400)
RDW: 13.8 % (ref 11.5–15.5)
WBC: 6.5 10*3/uL (ref 4.0–10.5)

## 2012-07-26 LAB — GLUCOSE, CAPILLARY
Glucose-Capillary: 104 mg/dL — ABNORMAL HIGH (ref 70–99)
Glucose-Capillary: 50 mg/dL — ABNORMAL LOW (ref 70–99)
Glucose-Capillary: 53 mg/dL — ABNORMAL LOW (ref 70–99)
Glucose-Capillary: 65 mg/dL — ABNORMAL LOW (ref 70–99)
Glucose-Capillary: 44 mg/dL — CL (ref 70–99)
Glucose-Capillary: 71 mg/dL (ref 70–99)
Glucose-Capillary: 62 mg/dL — ABNORMAL LOW (ref 70–99)
Glucose-Capillary: 89 mg/dL (ref 70–99)
Glucose-Capillary: 126 mg/dL — ABNORMAL HIGH (ref 70–99)

## 2012-07-26 LAB — TROPONIN I
Troponin I: 0.61 ng/mL (ref <0.30)
Troponin I: 0.6 ng/mL (ref <0.30)
Troponin I: 0.34 ng/mL (ref <0.30)

## 2012-07-26 LAB — BASIC METABOLIC PANEL
BUN: 37 mg/dL — ABNORMAL HIGH (ref 6–23)
Calcium: 8.5 mg/dL (ref 8.4–10.5)
Creatinine, Ser: 1.87 mg/dL — ABNORMAL HIGH (ref 0.50–1.35)
GFR calc Af Amer: 39 mL/min — ABNORMAL LOW (ref >90)
GFR calc non Af Amer: 33 mL/min — ABNORMAL LOW (ref >90)

## 2012-07-26 MED ORDER — ONDANSETRON HCL 4 MG/2ML IJ SOLN: 4.0000 mg | Freq: Once | INTRAMUSCULAR | Status: AC

## 2012-07-26 MED ORDER — ONDANSETRON HCL 4 MG/2ML IJ SOLN
INTRAMUSCULAR | Status: AC
  Administered 2012-07-26: 4 mg via INTRAVENOUS
  Filled 2012-07-26: qty 2

## 2012-07-26 MED ORDER — DEXTROSE 50 % IV SOLN
1.0000 | Freq: Once | INTRAVENOUS | Status: AC
  Administered 2012-07-26: 50 mL via INTRAVENOUS
  Filled 2012-07-26: qty 50

## 2012-07-26 MED ORDER — ADULT MULTIVITAMIN W/MINERALS CH
1.0000 | ORAL_TABLET | Freq: Every day | ORAL | Status: DC
  Administered 2012-07-26 – 2012-07-27 (×2): 1 via ORAL
  Filled 2012-07-26 (×2): qty 1

## 2012-07-26 MED ORDER — DEXTROSE 10 % IV SOLN
INTRAVENOUS | Status: DC
  Administered 2012-07-26 (×2): via INTRAVENOUS
  Administered 2012-07-27: 1000 mL via INTRAVENOUS

## 2012-07-26 MED ORDER — ZOLPIDEM TARTRATE 5 MG PO TABS
5.0000 mg | ORAL_TABLET | Freq: Once | ORAL | Status: AC
  Administered 2012-07-27: 5 mg via ORAL
  Filled 2012-07-26: qty 1

## 2012-07-26 MED ORDER — DEXTROSE 10 % IV SOLN: INTRAVENOUS | Status: DC

## 2012-07-26 MED ORDER — DEXTROSE 50 % IV SOLN
INTRAVENOUS | Status: AC
  Administered 2012-07-26: 50 mL via INTRAVENOUS
  Filled 2012-07-26: qty 50

## 2012-07-26 MED ORDER — HYDRALAZINE HCL 20 MG/ML IJ SOLN
10.0000 mg | Freq: Four times a day (QID) | INTRAMUSCULAR | Status: DC | PRN
  Administered 2012-07-26: 10 mg via INTRAVENOUS
  Filled 2012-07-26: qty 1

## 2012-07-26 MED ORDER — DEXTROSE 50 % IV SOLN
INTRAVENOUS | Status: AC
  Filled 2012-07-26: qty 50

## 2012-07-26 MED ORDER — ASPIRIN EC 81 MG PO TBEC
81.0000 mg | DELAYED_RELEASE_TABLET | Freq: Every day | ORAL | Status: DC
  Administered 2012-07-26 – 2012-07-27 (×2): 81 mg via ORAL
  Filled 2012-07-26 (×3): qty 1

## 2012-07-26 MED ORDER — DEXTROSE 50 % IV SOLN
1.0000 | Freq: Once | INTRAVENOUS | Status: AC
  Administered 2012-07-26: 50 mL via INTRAVENOUS

## 2012-07-26 MED ORDER — DEXTROSE 50 % IV SOLN
50.0000 mL | Freq: Once | INTRAVENOUS | Status: AC
  Administered 2012-07-26: 50 mL via INTRAVENOUS
  Filled 2012-07-26: qty 100

## 2012-07-26 MED ORDER — DEXTROSE 50 % IV SOLN
50.0000 mL | Freq: Once | INTRAVENOUS | Status: AC
  Administered 2012-07-26: 50 mL via INTRAVENOUS

## 2012-07-26 MED ORDER — DEXTROSE 10 % IV SOLN
INTRAVENOUS | Status: DC
  Administered 2012-07-26: 05:00:00 via INTRAVENOUS

## 2012-07-26 MED ORDER — HYDRALAZINE HCL 20 MG/ML IJ SOLN
INTRAMUSCULAR | Status: AC
  Filled 2012-07-26: qty 1

## 2012-07-26 MED ORDER — SIMVASTATIN 10 MG PO TABS
10.0000 mg | ORAL_TABLET | Freq: Every day | ORAL | Status: DC
  Administered 2012-07-26 – 2012-07-27 (×2): 10 mg via ORAL
  Filled 2012-07-26 (×2): qty 1

## 2012-07-26 MED ORDER — SODIUM CHLORIDE 0.9 % IJ SOLN
3.0000 mL | Freq: Two times a day (BID) | INTRAMUSCULAR | Status: DC
  Administered 2012-07-26 – 2012-07-27 (×4): 3 mL via INTRAVENOUS
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
## 2012-07-26 NOTE — Progress Notes (Signed)
Inpatient Diabetes Program Recommendations  AACE/ADA: New Consensus Statement on Inpatient Glycemic Control (2013)  Target Ranges:  Prepandial:   less than 140 mg/dL      Peak postprandial:   less than 180 mg/dL (1-2 hours)      Critically ill patients:  140 - 180 mg/dL   Reason for Visit: Hypoglycemia  Inpatient Diabetes Program Recommendations HgbA1C: Please consider ordeirng an A1C to determine glycemic control over the past 2-3 months.  Note: Patient has a history of diabetes and takes Amaryl 4mg  daily with lunch at home for diabetes management.  Currently, patient is not ordered to receive any medication for inpatient glycemic control due to initial presentation of hypoglycemia.  However, blood glucose is being monitored frequently .  Noted that patient reported having a GI illness for the past 2 days prior to admission and reports having diarrhea.  Also noted that BUN and Creatinine elevated on labs.  Question if patient needs oral diabetic medication as an outpatient or if acute episode of hypoglycemia related to combination of GI illness and renal function.  Last A1C in the chart was 6.1% on 06/30/2010.  Please consider ordering and A1C to determine glycemic control over the past 2-3 months.  Will continue to follow.  Thanks, Orlando Penner, RN, BSN, CCRN Diabetes Coordinator Inpatient Diabetes Program [phone]

## 2012-07-26 NOTE — Progress Notes (Signed)
Echocardiogram 2D Echocardiogram has been performed.  Seabron Iannello 07/26/2012, 1:48 PM

## 2012-07-26 NOTE — Progress Notes (Signed)
79 yo man brought in unresponsive with glucose of 20.  He is on glimiperide.  Lab workup showed elevated BUN and creatinine.  Troponin I was unexpectedly elevated at 0.28.  He has no chest pain.  Blood glucose up and down despite boluses of D50W and IV D10W.  Call to Dr. Onalee Hua who advised admission to a telemetry unit to [facility].

## 2012-07-26 NOTE — Progress Notes (Addendum)
[facility] Progress Note [place] TEAM 1 - Stepdown/ICU TEAM   Kendel Bessey ZOX:[id number] DOB: 04-Dec-1933 DOA: 07/25/2012 PCP: No primary provider on file.  Brief narrative: 79 yo male niddm, prev cva, htn comes in after having an episode of getting very weak and falling to the ground at home, was very confused. ems called by wife earlier today and pt was found to be with low sugar, do not know how low was given some juice and improved. Then hours later another episode occurred, ems was called again to the home his glucose was 20. Pt is now in ED, and feeling much better, son is present and he is back to his baseline. He has been having a gi illness the last 2 days with some diarrhea which has finally resolved. No n/v. Eating ok. On oral hyperglycemic agents. No fevers. No rashes. No focal neurological deficits. No cp. No sob. No trauma to his chest wall when he fell and got weak today. No pain in any extremities at this time.   Assessment/Plan: Principal Problem:   Hypoglycemia - last dose of Amaryl at 11 AM on 4/24 - cont to monitor sugars- now Q2 hrs and adjust D10 infusion as sugars rise - change from carb modified diet to regular diet  Active Problems:   Diabetes mellitus without complication - pt states once about 2 wks ago, his sugar dropped too low - he admits he did not eat well that day - I have explained that he should hold his Amaryl if he feels that he is unable to eat his meals    Hypertension - holding Diovan due to ARF - holding HCTZ due to hyponatremia    ARF on CKD (chronic kidney disease) - Cr slilghtly elevated from baseline likely due to recent vomiting and diarrhea - cont to hold Diovan and hydrate    Troponin I above reference range - no complaints of chest pain or dyspnea at rest or with exertion - suspect demand ischemia due to hypoglycemia - will obtain ECHO for wall motion abnormalities    At high risk for falls - pt states he only fell when  he was hypoglycemic - will obtain PT eval for AM    Code Status: full Family Communication: none Disposition Plan: follow on med surg  Consultants: none  Procedures: none  Antibiotics: none  DVT prophylaxis: SCDs  HPI/Subjective: Pt feels well, appetite is still poor however. States nausea/vomiting and diarrhea resolved about 2 days ago but appetite continues to be poor. No abd pain.    Objective: Blood pressure 157/70, pulse 86, temperature 98 F (36.7 C), temperature source Oral, resp. rate 20, SpO2 100.00%.  Intake/Output Summary (Last 24 hours) at 07/26/12 1400 Last data filed at 07/26/12 1300  Gross per 24 hour  Intake   1280 ml  Output   2090 ml  Net   -810 ml     Exam: General: No acute respiratory distress Lungs: Clear to auscultation bilaterally without wheezes or crackles Cardiovascular: Regular rate and rhythm without murmur gallop or rub normal S1 and S2 Abdomen: Nontender, nondistended, soft, bowel sounds positive, no rebound, no ascites, no appreciable mass Extremities: No significant cyanosis, clubbing, or edema bilateral lower extremities  Data Reviewed: Basic Metabolic Panel:  Recent Labs Lab 07/25/12 2121 07/26/12 0635  NA 136 133*  K 3.9 4.0  CL 106 105  CO2 21 18*  GLUCOSE 85 40*  BUN 40* 37*  CREATININE 1.95* 1.87*  CALCIUM 8.5 8.5  Liver Function Tests:  Recent Labs Lab 07/25/12 2121  AST 26  ALT 21  ALKPHOS 88  BILITOT 0.3  PROT 6.7  ALBUMIN 2.9*   No results found for this basename: LIPASE, AMYLASE,  in the last 168 hours No results found for this basename: AMMONIA,  in the last 168 hours CBC:  Recent Labs Lab 07/25/12 2121 07/26/12 0635  WBC 6.2 6.5  NEUTROABS 5.0  --   HGB 10.7* 10.4*  HCT 30.0* 29.8*  MCV 82.6 83.7  PLT 134* 115*   Cardiac Enzymes:  Recent Labs Lab 07/26/12 0303 07/26/12 0635 07/26/12 0837  TROPONINI 0.61* 0.60* 0.45*   BNP (last 3 results) No results found for this basename:  PROBNP,  in the last 8760 hours CBG:  Recent Labs Lab 07/26/12 0930 07/26/12 1029 07/26/12 1134 07/26/12 1205 07/26/12 1304  GLUCAP 131* 89 107* 110* 134*    Recent Results (from the past 240 hour(s))  MRSA PCR SCREENING     Status: None   Collection Time    07/26/12  6:07 AM      Result Value Range Status   MRSA by PCR NEGATIVE  NEGATIVE Final   Comment:            The GeneXpert MRSA Assay (FDA     approved for NASAL specimens     only), is one component of a     comprehensive MRSA colonization     surveillance program. It is not     intended to diagnose MRSA     infection nor to guide or     monitor treatment for     MRSA infections.     Studies:  Recent x-ray studies have been reviewed in detail by the Attending Physician  Scheduled Meds:  Scheduled Meds: . aspirin EC  81 mg Oral QHS  . multivitamin with minerals  1 tablet Oral Daily  . simvastatin  10 mg Oral Q lunch  . sodium chloride  3 mL Intravenous Q12H   Continuous Infusions: . dextrose    . dextrose 100 mL/hr at 07/26/12 0743  . dextrose 150 mL/hr at 07/26/12 1610    Time spent on care of this patient: 35 min   [facility]  [facility] Office  [phone] Pager - Text Page per Loretha Stapler as per below:  On-Call/Text Page:      [email or web address]      password [email or web address]  If 7PM-7AM, please contact night-coverage [email or web address] Password [email or web address] 07/26/2012, 2:00 PM   LOS: 1 day

## 2012-07-26 NOTE — ED Provider Notes (Signed)
Medical screening examination/treatment/procedure(s) were conducted as a shared visit with non-physician practitioner(s) and myself.  I personally evaluated the patient during the encounter 79 yo man brought in unresponsive with glucose of 20. He is on glimiperide. Lab workup showed elevated BUN and creatinine. Troponin I was unexpectedly elevated at 0.28. He has no chest pain. Blood glucose up and down despite boluses of D50W and IV D10W. Call to Dr. Onalee Hua who advised admission to a telemetry unit to [facility].    Carleene Cooper III, MD 07/26/12 [phone]

## 2012-07-26 NOTE — ED Notes (Signed)
PT son [name] [phone]

## 2012-07-27 ENCOUNTER — Encounter (HOSPITAL_COMMUNITY): Payer: Self-pay

## 2012-07-27 LAB — GLUCOSE, CAPILLARY
Glucose-Capillary: 125 mg/dL — ABNORMAL HIGH (ref 70–99)
Glucose-Capillary: 157 mg/dL — ABNORMAL HIGH (ref 70–99)
Glucose-Capillary: 159 mg/dL — ABNORMAL HIGH (ref 70–99)
Glucose-Capillary: 146 mg/dL — ABNORMAL HIGH (ref 70–99)

## 2012-07-27 LAB — BASIC METABOLIC PANEL
BUN: 35 mg/dL — ABNORMAL HIGH (ref 6–23)
CO2: 17 mEq/L — ABNORMAL LOW (ref 19–32)
CO2: 21 mEq/L (ref 19–32)
Chloride: 101 mEq/L (ref 96–112)
Chloride: 100 mEq/L (ref 96–112)
Creatinine, Ser: 2.03 mg/dL — ABNORMAL HIGH (ref 0.50–1.35)
Glucose, Bld: 147 mg/dL — ABNORMAL HIGH (ref 70–99)
Sodium: 127 mEq/L — ABNORMAL LOW (ref 135–145)

## 2012-07-27 LAB — URINE CULTURE
Colony Count: NO GROWTH
Culture: NO GROWTH

## 2012-07-27 MED ORDER — POLYETHYLENE GLYCOL 3350 17 G PO PACK
17.0000 g | PACK | Freq: Every day | ORAL | Status: DC
  Administered 2012-07-27: 17 g via ORAL
  Filled 2012-07-27: qty 1

## 2012-07-27 NOTE — Discharge Summary (Signed)
Discharge instruction given and patient verbalized understanding.  IV have been DC, sign and symptoms of hypoglycemia explain. Pt discharge via wheelchair accompanied by nurse and son.

## 2012-07-27 NOTE — Progress Notes (Signed)
Abnormal K+ 6.4, blood was hemolysed CBC have been reorder. Incoming nurse is aware, msg sent to Dr. Butler Denmark.   [name]

## 2012-07-27 NOTE — Discharge Summary (Addendum)
Physician Discharge Summary  [name]:[id number] DOB: January 06, 1934 DOA: 07/25/2012  PCP: [name]  Admit date: 07/25/2012 Discharge date: 07/27/2012  Time spent: 45 minutes  Discharge Diagnoses:  Principal Problem:   Hypoglycemia Active Problems:   Diabetes mellitus without complication   Hypertension   Unresponsive episode   CKD (chronic kidney disease)   Troponin I above reference range   At high risk for falls   Discharge Condition: stable   Diet recommendation: diabetic and heart healthy  Filed Weights   07/27/12 0417 07/27/12 1100  Weight: 89.5 kg (197 lb 5 oz) 92 kg (202 lb 13.2 oz)    History of present illness:  79 yo male niddm, prev cva, htn comes in after having an episode of getting very weak and falling to the ground at home, was very confused. ems called by wife earlier today and pt was found to be with low sugar, do not know how low was given some juice and improved. Then hours later another episode occurred, ems was called again to the home his glucose was 20. Pt is now in ED, and feeling much better, son is present and he is back to his baseline. He has been having a gi illness the last 2 days with some diarrhea which has finally resolved. No n/v. Eating ok. On oral hyperglycemic agents. No fevers. No rashes. No focal neurological deficits. No cp. No sob. No trauma to his chest wall when he fell and got weak today. No pain in any extremities at this time.   Hospital Course:  Principal Problem:  Hypoglycemia  - last dose of Amaryl at 11 AM on 4/24  - Pt advised that if his by mouth intake is poor, he should not be taking the Amaryl.  Active Problems:  Diabetes mellitus without complication  - pt states once about 2 wks ago, his sugar dropped too low - he admits he did not eat well that day  - I have explained that he should hold his Amaryl if he feels that he is unable to eat his meals  Hypertension  - holding Diovan due to ARF  - holding HCTZ due to  hyponatremia   ARF on CKD (chronic kidney disease)  - Cr slilghtly elevated from baseline likely due to recent vomiting and diarrhea - cont to hold Diovan and hydrate   Troponin I above reference range  - no complaints of chest pain or dyspnea at rest or with exertion  - suspect demand ischemia due to hypoglycemia  -  ECHO obtained for wall motion abnormalities  Study Conclusions:  - Left ventricle: The cavity size was normal. Wall thickness was normal. Systolic function was normal. The estimated ejection fraction was in the range of 55% to 60%. Wall motion was normal; there were no regional wall motion abnormalities. Doppler parameters are consistent with abnormal left ventricular relaxation (grade 1 diastolic dysfunction). - Left atrium: The atrium was mildly dilated. - Pericardium, extracardiac: A small pericardial effusion was identified anterior to the heart.   At high risk for falls  - pt states he only fell when he was hypoglycemic   Discharge Exam: Filed Vitals:   07/27/12 1100 07/27/12 1200 07/27/12 1300 07/27/12 1400  BP: 121/51 147/63 142/69 120/54  Pulse: 73 77 74 78  Temp: 98.2 F (36.8 C)     TempSrc: Oral     Resp: 18 17 18 22   Height: 5\' 7"  (1.702 m)     Weight: 92 kg (202 lb 13.2 oz)  SpO2: 96% 97% 99% 96%    General: Awake alert oriented x3, in no distress Cardiovascular: Regular rate and rhythm no murmurs rubs or gallops Respiratory: Clear to auscultation bilaterally  Discharge Instructions     Medication List    TAKE these medications       aspirin EC 81 MG tablet  Take 81 mg by mouth at bedtime.     glimepiride 4 MG tablet  Commonly known as:  AMARYL  Take 4 mg by mouth daily with lunch.     multivitamin with minerals Tabs  Take 1 tablet by mouth daily. [employer] brand eye vitamins     simvastatin 10 MG tablet  Commonly known as:  ZOCOR  Take 10 mg by mouth daily with lunch.     valsartan-hydrochlorothiazide 160-12.5 MG per  tablet  Commonly known as:  DIOVAN-HCT  Take 1 tablet by mouth daily with lunch.          The results of significant diagnostics from this hospitalization (including imaging, microbiology, ancillary and laboratory) are listed below for reference.    Significant Diagnostic Studies: Ct Head Wo Contrast  07/25/2012  *RADIOLOGY REPORT*  Clinical Data: Larey Seat.  Hit head.  CT HEAD WITHOUT CONTRAST  Technique:  Contiguous axial images were obtained from the base of the skull through the vertex without contrast.  Comparison: 07/02/2010.  Findings: Progressive age related cerebral atrophy, ventriculomegaly and fairly significant periventricular white matter disease.  No extra-axial fluid collections are identified. No CT findings for acute hemispheric infarction or intracranial hemorrhage.  No mass lesions are identified.  The brainstem and cerebellum are grossly normal and stable.  The bony structures are intact.  The paranasal sinuses and mastoid air cells are clear except for a small mucous retention cyst or polyp in the right maxillary sinus and scattered ethmoid disease. The globes are intact  IMPRESSION: Progressive age related periventricular white matter changes. No acute intracranial findings or mass lesions.   Original Report Authenticated By: Rudie Meyer, M.D.    Dg Chest Port 1 View  07/26/2012  *RADIOLOGY REPORT*  Clinical Data: Elevated troponin  PORTABLE CHEST - 1 VIEW  Comparison: 06/30/2010  Findings: Prominent cardiomediastinal contours, similar to prior. Mild lingular opacity, favor atelectasis or scarring.  Otherwise, no consolidation, pleural effusion, or pneumothorax.  No acute osseous finding.  IMPRESSION: Prominent cardiomediastinal contours, similar to prior.  Mild lingular opacity, favor atelectasis or scarring.   Original Report Authenticated By: Jearld Lesch, M.D.     Microbiology: Recent Results (from the past 240 hour(s))  URINE CULTURE     Status: None   Collection Time     07/25/12  7:44 PM      Result Value Range Status   Specimen Description URINE, CLEAN CATCH   Final   Special Requests ADDED [id number]   Final   Culture  Setup Time 07/25/2012 23:05   Final   Colony Count NO GROWTH   Final   Culture NO GROWTH   Final   Report Status 07/27/2012 FINAL   Final  MRSA PCR SCREENING     Status: None   Collection Time    07/26/12  6:07 AM      Result Value Range Status   MRSA by PCR NEGATIVE  NEGATIVE Final   Comment:            The GeneXpert MRSA Assay (FDA     approved for NASAL specimens     only), is one component of a  comprehensive MRSA colonization     surveillance program. It is not     intended to diagnose MRSA     infection nor to guide or     monitor treatment for     MRSA infections.     Labs: Basic Metabolic Panel:  Recent Labs Lab 07/25/12 2121 07/26/12 0635 07/27/12 0530  NA 136 133* 127*  K 3.9 4.0 6.4*  CL 106 105 101  CO2 21 18* 17*  GLUCOSE 85 40* 171*  BUN 40* 37* 34*  CREATININE 1.95* 1.87* 1.96*  CALCIUM 8.5 8.5 8.2*   Liver Function Tests:  Recent Labs Lab 07/25/12 2121  AST 26  ALT 21  ALKPHOS 88  BILITOT 0.3  PROT 6.7  ALBUMIN 2.9*   No results found for this basename: LIPASE, AMYLASE,  in the last 168 hours No results found for this basename: AMMONIA,  in the last 168 hours CBC:  Recent Labs Lab 07/25/12 2121 07/26/12 0635  WBC 6.2 6.5  NEUTROABS 5.0  --   HGB 10.7* 10.4*  HCT 30.0* 29.8*  MCV 82.6 83.7  PLT 134* 115*   Cardiac Enzymes:  Recent Labs Lab 07/26/12 0303 07/26/12 0635 07/26/12 0837 07/26/12 1355 07/26/12 2025  TROPONINI 0.61* 0.60* 0.45* 0.39* 0.34*   BNP: BNP (last 3 results) No results found for this basename: PROBNP,  in the last 8760 hours CBG:  Recent Labs Lab 07/27/12 0416 07/27/12 0602 07/27/12 0750 07/27/12 1001 07/27/12 1151  GLUCAP 157* 159* 166* 135* 144*       Signed:  Ezabella Teska  [facility] 07/27/2012, 4:49 PM

## 2012-07-27 NOTE — Progress Notes (Signed)
Son, Loraine Leriche is patients ride home - he was called and notified of patients discharge, stating that he cannot pick patient up until 9p.

## 2012-07-27 NOTE — Evaluation (Signed)
Physical Therapy Evaluation Patient Details Name: [name] MRN: [id number] DOB: October 19, 1933 Today's Date: 07/27/2012 Time: 4098-1191 PT Time Calculation (min): 12 min  PT Assessment / Plan / Recommendation Clinical Impression  Patient is a 79 yo male admitted following hypoglycemic events.  Patient with decreased balance impacting mobility.  Encouraged use of RW for safety.  Recommend HHPT at discharge for continued therapy.    PT Assessment  Patient needs continued PT services    Follow Up Recommendations  Home health PT;Supervision/Assistance - 24 hour    Does the patient have the potential to tolerate intense rehabilitation      Barriers to Discharge None      Equipment Recommendations  None recommended by PT    Recommendations for Other Services     Frequency Min 3X/week    Precautions / Restrictions Precautions Precautions: Fall Restrictions Weight Bearing Restrictions: No   Pertinent Vitals/Pain       Mobility  Bed Mobility Bed Mobility: Supine to Sit Supine to Sit: 6: Modified independent (Device/Increase time);HOB flat;With rails Transfers Transfers: Sit to Stand;Stand to Sit Sit to Stand: 4: Min guard;With upper extremity assist;From bed Stand to Sit: 4: Min guard;With upper extremity assist;With armrests;To chair/3-in-1 Details for Transfer Assistance: verbal cues for hand placement and to stand for several seconds prior to ambulation. Ambulation/Gait Ambulation/Gait Assistance: 4: Min assist Ambulation Distance (Feet): 84 Feet Assistive device: Rolling walker Ambulation/Gait Assistance Details: Attempted ambulation without assistive device - required +2 hand hold assist.  Used RW - improved balance.  Verbal cues for safe use of RW - to stay close to RW. Gait Pattern: Step-through pattern;Decreased stride length;Trunk flexed    Exercises     PT Diagnosis: Abnormality of gait  PT Problem List: Decreased activity tolerance;Decreased  balance;Decreased mobility;Decreased knowledge of use of DME PT Treatment Interventions: DME instruction;Gait training;Functional mobility training;Balance training;Patient/family education   PT Goals Acute Rehab PT Goals PT Goal Formulation: With patient Time For Goal Achievement: 08/03/12 Potential to Achieve Goals: Good Pt will go Sit to Stand: with modified independence;with upper extremity assist PT Goal: Sit to Stand - Progress: Goal set today Pt will Ambulate: >150 feet;with modified independence;with rolling walker PT Goal: Ambulate - Progress: Goal set today Pt will Go Up / Down Stairs: 3-5 stairs;with supervision;with rail(s);with least restrictive assistive device PT Goal: Up/Down Stairs - Progress: Goal set today  Visit Information  Last PT Received On: 07/27/12 Assistance Needed: +1    Subjective Data  Subjective: My wife takes care of meals and cleaning.  She drives. Patient Stated Goal: To go home soon   Prior Functioning  Home Living Lives With: Spouse Available Help at Discharge: Family;Available 24 hours/day Type of Home: House Home Access: Stairs to enter [employer] of Steps: 2 Entrance Stairs-Rails: None Home Layout: One level Home Adaptive Equipment: Walker - rolling;Shower chair with back Prior Function Level of Independence: Independent;Needs assistance Needs Assistance: Light Housekeeping;Meal Prep Meal Prep: Total Light Housekeeping: Total Able to Take Stairs?: Yes Driving: No Vocation: Retired Musician: No difficulties    Copywriter, advertising Arousal/Alertness: Awake/alert Behavior During Therapy: WFL for tasks assessed/performed Overall Cognitive Status: Within Functional Limits for tasks assessed    Extremity/Trunk Assessment Right Upper Extremity Assessment RUE ROM/Strength/Tone: [facility] for tasks assessed Left Upper Extremity Assessment LUE ROM/Strength/Tone: WFL for tasks assessed Right Lower Extremity  Assessment RLE ROM/Strength/Tone: [facility] for tasks assessed Left Lower Extremity Assessment LLE ROM/Strength/Tone: [facility] for tasks assessed   Balance    End of Session  PT - End of Session Equipment Utilized During Treatment: Gait belt Activity Tolerance: Patient limited by fatigue Patient left: in chair;with call bell/phone within reach Nurse Communication: Mobility status  GP     Vena Austria 07/27/2012, 5:03 PM Durenda Hurt. Renaldo Fiddler, [facility] Acute Rehab Services Pager [phone]

## 2012-08-01 ENCOUNTER — Inpatient Hospital Stay (HOSPITAL_COMMUNITY)
Admission: EM | Admit: 2012-08-01 | Discharge: 2012-08-02 | DRG: 639 | Disposition: A | Discharge status: Home Health | Payer: Medicare Other | Attending: Internal Medicine | Admitting: Internal Medicine

## 2012-08-01 ENCOUNTER — Encounter (HOSPITAL_COMMUNITY): Payer: Self-pay | Admitting: Emergency Medicine

## 2012-08-01 DIAGNOSIS — Z7982 Long term (current) use of aspirin: Secondary | ICD-10-CM

## 2012-08-01 DIAGNOSIS — E119 Type 2 diabetes mellitus without complications: Secondary | ICD-10-CM

## 2012-08-01 DIAGNOSIS — M7989 Other specified soft tissue disorders: Secondary | ICD-10-CM

## 2012-08-01 DIAGNOSIS — D638 Anemia in other chronic diseases classified elsewhere: Secondary | ICD-10-CM | POA: Diagnosis present

## 2012-08-01 DIAGNOSIS — I129 Hypertensive chronic kidney disease with stage 1 through stage 4 chronic kidney disease, or unspecified chronic kidney disease: Secondary | ICD-10-CM | POA: Diagnosis present

## 2012-08-01 DIAGNOSIS — N189 Chronic kidney disease, unspecified: Secondary | ICD-10-CM

## 2012-08-01 DIAGNOSIS — E1169 Type 2 diabetes mellitus with other specified complication: Principal | ICD-10-CM | POA: Diagnosis present

## 2012-08-01 DIAGNOSIS — E669 Obesity, unspecified: Secondary | ICD-10-CM | POA: Diagnosis present

## 2012-08-01 DIAGNOSIS — E162 Hypoglycemia, unspecified: Secondary | ICD-10-CM

## 2012-08-01 DIAGNOSIS — I10 Essential (primary) hypertension: Secondary | ICD-10-CM | POA: Diagnosis present

## 2012-08-01 DIAGNOSIS — Z9181 History of falling: Secondary | ICD-10-CM

## 2012-08-01 DIAGNOSIS — R4189 Other symptoms and signs involving cognitive functions and awareness: Secondary | ICD-10-CM

## 2012-08-01 DIAGNOSIS — Z794 Long term (current) use of insulin: Secondary | ICD-10-CM

## 2012-08-01 DIAGNOSIS — R600 Localized edema: Secondary | ICD-10-CM | POA: Diagnosis present

## 2012-08-01 DIAGNOSIS — Z79899 Other long term (current) drug therapy: Secondary | ICD-10-CM

## 2012-08-01 DIAGNOSIS — D649 Anemia, unspecified: Secondary | ICD-10-CM

## 2012-08-01 DIAGNOSIS — Z6830 Body mass index (BMI) 30.0-30.9, adult: Secondary | ICD-10-CM

## 2012-08-01 DIAGNOSIS — N183 Chronic kidney disease, stage 3 unspecified: Secondary | ICD-10-CM | POA: Diagnosis present

## 2012-08-01 HISTORY — DX: Chronic kidney disease, stage 3 (moderate): N18.3

## 2012-08-01 HISTORY — DX: Anemia, unspecified: D64.9

## 2012-08-01 HISTORY — DX: Chronic kidney disease, stage 3 unspecified: N18.30

## 2012-08-01 HISTORY — DX: Hypoglycemia, unspecified: E16.2

## 2012-08-01 LAB — GLUCOSE, CAPILLARY
Glucose-Capillary: 112 mg/dL — ABNORMAL HIGH (ref 70–99)
Glucose-Capillary: 40 mg/dL — CL (ref 70–99)
Glucose-Capillary: 86 mg/dL (ref 70–99)
Glucose-Capillary: 24 mg/dL — CL (ref 70–99)
Glucose-Capillary: 26 mg/dL — CL (ref 70–99)
Glucose-Capillary: 49 mg/dL — ABNORMAL LOW (ref 70–99)
Glucose-Capillary: 92 mg/dL (ref 70–99)
Glucose-Capillary: 57 mg/dL — ABNORMAL LOW (ref 70–99)
Glucose-Capillary: 47 mg/dL — ABNORMAL LOW (ref 70–99)
Glucose-Capillary: 50 mg/dL — ABNORMAL LOW (ref 70–99)
Glucose-Capillary: 72 mg/dL (ref 70–99)
Glucose-Capillary: 59 mg/dL — ABNORMAL LOW (ref 70–99)
Glucose-Capillary: 53 mg/dL — ABNORMAL LOW (ref 70–99)

## 2012-08-01 LAB — POCT I-STAT, CHEM 8
BUN: 45 mg/dL — ABNORMAL HIGH (ref 6–23)
Creatinine, Ser: 2.1 mg/dL — ABNORMAL HIGH (ref 0.50–1.35)
Hemoglobin: 10.5 g/dL — ABNORMAL LOW (ref 13.0–17.0)
Potassium: 4.9 mEq/L (ref 3.5–5.1)
Sodium: 137 mEq/L (ref 135–145)
TCO2: 23 mmol/L (ref 0–100)

## 2012-08-01 LAB — COMPREHENSIVE METABOLIC PANEL
ALT: 17 U/L (ref 0–53)
AST: 17 U/L (ref 0–37)
Alkaline Phosphatase: 82 U/L (ref 39–117)
CO2: 24 mEq/L (ref 19–32)
Calcium: 8.9 mg/dL (ref 8.4–10.5)
Chloride: 103 mEq/L (ref 96–112)
GFR calc non Af Amer: 27 mL/min — ABNORMAL LOW (ref >90)
Potassium: 4.9 mEq/L (ref 3.5–5.1)
Sodium: 137 mEq/L (ref 135–145)

## 2012-08-01 LAB — CBC WITH DIFFERENTIAL/PLATELET
Eosinophils Absolute: 0.1 10*3/uL (ref 0.0–0.7)
Eosinophils Relative: 1 % (ref 0–5)
Lymphs Abs: 1.2 10*3/uL (ref 0.7–4.0)
MCH: 29.1 pg (ref 26.0–34.0)
MCV: 82.9 fL (ref 78.0–100.0)
Monocytes Relative: 7 % (ref 3–12)
Platelets: 168 10*3/uL (ref 150–400)
RBC: 3.68 MIL/uL — ABNORMAL LOW (ref 4.22–5.81)

## 2012-08-01 LAB — HEMOGLOBIN A1C: Hgb A1c MFr Bld: 5.2 % (ref <5.7)

## 2012-08-01 MED ORDER — ADULT MULTIVITAMIN W/MINERALS CH
1.0000 | ORAL_TABLET | Freq: Every day | ORAL | Status: DC
  Administered 2012-08-02: 1 via ORAL
  Filled 2012-08-01 (×2): qty 1

## 2012-08-01 MED ORDER — IRBESARTAN 150 MG PO TABS
150.0000 mg | ORAL_TABLET | Freq: Every day | ORAL | Status: DC
  Administered 2012-08-02: 150 mg via ORAL
  Filled 2012-08-01 (×2): qty 1

## 2012-08-01 MED ORDER — GLUCAGON HCL (RDNA) 1 MG IJ SOLR
1.0000 mg | INTRAMUSCULAR | Status: AC
  Administered 2012-08-01: 1 mg via INTRAVENOUS
  Filled 2012-08-01 (×2): qty 1

## 2012-08-01 MED ORDER — ASPIRIN EC 81 MG PO TBEC
81.0000 mg | DELAYED_RELEASE_TABLET | Freq: Every day | ORAL | Status: DC
  Administered 2012-08-01: 81 mg via ORAL
  Filled 2012-08-01 (×3): qty 1

## 2012-08-01 MED ORDER — GLUCOSE-VITAMIN C 4-6 GM-MG PO CHEW
4.0000 | CHEWABLE_TABLET | ORAL | Status: DC | PRN
  Administered 2012-08-01 (×2): 4 via ORAL

## 2012-08-01 MED ORDER — DEXTROSE 50 % IV SOLN
25.0000 mL | Freq: Once | INTRAVENOUS | Status: AC | PRN
  Administered 2012-08-01: 25 mL via INTRAVENOUS
  Filled 2012-08-01: qty 50

## 2012-08-01 MED ORDER — DEXTROSE 50 % IV SOLN
1.0000 | Freq: Once | INTRAVENOUS | Status: AC
  Administered 2012-08-01: 50 mL via INTRAVENOUS

## 2012-08-01 MED ORDER — DEXTROSE-NACL 5-0.9 % IV SOLN
INTRAVENOUS | Status: DC
  Administered 2012-08-01: 03:00:00 via INTRAVENOUS

## 2012-08-01 MED ORDER — DEXTROSE 10 % IV SOLN
INTRAVENOUS | Status: DC
  Administered 2012-08-01: 11:00:00 via INTRAVENOUS
  Filled 2012-08-01 (×3): qty 1000

## 2012-08-01 MED ORDER — DEXTROSE 50 % IV SOLN
1.0000 | INTRAVENOUS | Status: AC
  Administered 2012-08-01: 50 mL via INTRAVENOUS
  Filled 2012-08-01: qty 50

## 2012-08-01 MED ORDER — VALSARTAN-HYDROCHLOROTHIAZIDE 160-12.5 MG PO TABS: 1.0000 | ORAL_TABLET | Freq: Every day | ORAL | Status: DC

## 2012-08-01 MED ORDER — DEXTROSE 50 % IV SOLN
INTRAVENOUS | Status: AC
  Administered 2012-08-01: 50 mL
  Filled 2012-08-01: qty 50

## 2012-08-01 MED ORDER — GLUCOSE 40 % PO GEL: 1.0000 | ORAL | Status: DC | PRN

## 2012-08-01 MED ORDER — SIMVASTATIN 10 MG PO TABS
10.0000 mg | ORAL_TABLET | Freq: Every day | ORAL | Status: DC
  Administered 2012-08-02: 10 mg via ORAL
  Filled 2012-08-01 (×2): qty 1

## 2012-08-01 MED ORDER — DEXTROSE 50 % IV SOLN
1.0000 | Freq: Once | INTRAVENOUS | Status: AC
  Administered 2012-08-01: 50 mL via INTRAVENOUS
  Filled 2012-08-01: qty 50

## 2012-08-01 MED ORDER — DEXTROSE 50 % IV SOLN: 50.0000 mL | Freq: Once | INTRAVENOUS | Status: AC | PRN

## 2012-08-01 MED ORDER — GLUCOSE-VITAMIN C 4-6 GM-MG PO CHEW
4.0000 | CHEWABLE_TABLET | ORAL | Status: DC | PRN
  Filled 2012-08-01: qty 1

## 2012-08-01 MED ORDER — HYDROCHLOROTHIAZIDE 12.5 MG PO CAPS
12.5000 mg | ORAL_CAPSULE | Freq: Every day | ORAL | Status: DC
  Administered 2012-08-02: 12.5 mg via ORAL
  Filled 2012-08-01 (×2): qty 1

## 2012-08-01 MED ORDER — LABETALOL HCL 5 MG/ML IV SOLN
10.0000 mg | INTRAVENOUS | Status: DC | PRN
  Administered 2012-08-01 – 2012-08-02 (×2): 10 mg via INTRAVENOUS
  Filled 2012-08-01: qty 4

## 2012-08-01 MED ORDER — DEXTROSE 50 % IV SOLN
50.0000 mL | Freq: Once | INTRAVENOUS | Status: AC | PRN
  Administered 2012-08-01: 50 mL via INTRAVENOUS
  Filled 2012-08-01: qty 50

## 2012-08-01 MED ORDER — GLUCAGON HCL (RDNA) 1 MG IJ SOLR
1.0000 mg | Freq: Once | INTRAMUSCULAR | Status: AC
  Administered 2012-08-01: 1 mg via INTRAVENOUS
  Filled 2012-08-01 (×2): qty 1

## 2012-08-01 MED ORDER — DEXTROSE 50 % IV SOLN
INTRAVENOUS | Status: AC
  Filled 2012-08-01: qty 50

## 2012-08-01 NOTE — Progress Notes (Signed)
Utilization review completed.  

## 2012-08-01 NOTE — ED Notes (Signed)
MD at bedside.otter  

## 2012-08-01 NOTE — ED Notes (Signed)
MD notified of patient Blood sugar

## 2012-08-01 NOTE — Progress Notes (Signed)
Hypoglycemic Event  CBG: 59  Treatment: Glucagon IV 1mg    Symptoms: None  Follow-up CBG: Time:1129 CBG Result:60  Possible Reasons for Event: Unknown  Comments/MD notified:Dr. Vernell Morgans, Wynona Meals  Remember to initiate Hypoglycemia Order Set & complete

## 2012-08-01 NOTE — Progress Notes (Signed)
Hypoglycemic Event  CBG: 50  Treatment: 3 glucose tabs and D50 IV 25 mL  Symptoms: None  Follow-up CBG: Time:0959 CBG Result:38  Possible Reasons for Event: Unknown  Comments/MD notified:Dr. Vernell Morgans, Wynona Meals  Remember to initiate Hypoglycemia Order Set & complete

## 2012-08-01 NOTE — Progress Notes (Signed)
Hypoglycemic Event  CBG: 38  Treatment: D50 IV 25 mL  Symptoms: None  Follow-up CBG: Time:1032 CBG Result:59  Possible Reasons for Event: Unknown  Comments/MD notified:Dr. Vernell Morgans, Wynona Meals  Remember to initiate Hypoglycemia Order Set & complete

## 2012-08-01 NOTE — Progress Notes (Signed)
Hypoglycemic Event  CBG: 60  Treatment: D50 IV 50 mL  Symptoms: None  Follow-up CBG: Time:1238 CBG Result:72  Possible Reasons for Event: Unknown  Comments/MD notified:Dr. Vernell Morgans, Wynona Meals  Remember to initiate Hypoglycemia Order Set & complete

## 2012-08-01 NOTE — Progress Notes (Signed)
Hypoglycemic Event  CBG: 57  Treatment: 15 GM carbohydrate snack  Symptoms: None  Follow-up CBG: ZOXW:[id number] CBG Result:47  Possible Reasons for Event: Unknown  Comments/MD notified:Dr. Vernell Morgans, Wynona Meals  Remember to initiate Hypoglycemia Order Set & complete

## 2012-08-01 NOTE — Progress Notes (Signed)
Hypoglycemic Event  CBG: 47  Treatment: 3 glucose tabs  Symptoms: None  Follow-up CBG: Time: 0936 CBG Result:50  Possible Reasons for Event: Unknown  Comments/MD notified: Dr. Vernell Morgans, Wynona Meals  Remember to initiate Hypoglycemia Order Set & complete

## 2012-08-01 NOTE — ED Notes (Signed)
Pt states he is feeling lightheaded and complaining of a headache. Pt looks pale but is alert and oriented.

## 2012-08-01 NOTE — ED Notes (Signed)
MD at bedside. Consult MD at bedside

## 2012-08-01 NOTE — ED Notes (Signed)
Pt given a cup of orange juice. Pt drinking with problems

## 2012-08-01 NOTE — Progress Notes (Addendum)
Hypoglycemic Event  CBG: 59  Treatment: 15 GM carbohydrate snack  Symptoms: None  Follow-up CBG: Time:1431 CBG Result:53  Possible Reasons for Event: Unknown  Comments/MD notified:Dr. Vernell Morgans, Wynona Meals  Remember to initiate Hypoglycemia Order Set & complete
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
## 2012-08-01 NOTE — Progress Notes (Addendum)
Hypoglycemic Event  CBG: 49  Treatment: 15 GM carbohydrate snack  Symptoms: None  Follow-up CBG: Time:0744 CBG Result:57  Possible Reasons for Event: Unknown  Comments/MD notified: Dr. Vernell Morgans, Wynona Meals  Remember to initiate Hypoglycemia Order Set & complete

## 2012-08-01 NOTE — Progress Notes (Signed)
VASCULAR LAB PRELIMINARY  PRELIMINARY  PRELIMINARY  PRELIMINARY  Left lower extremity venous duplex completed.    Preliminary report:  Left:  No evidence of DVT or superficial thrombosis.  There is an area of mixed echoes in the popliteal fossa.  Wilmont Olund, RVT 08/01/2012, 11:51 AM

## 2012-08-01 NOTE — Progress Notes (Signed)
Hypoglycemic Event  CBG: 53  Treatment: D50 IV 50 mL  Symptoms: None  Follow-up CBG: Time:1514 CBG Result:84  Possible Reasons for Event: Unknown  Comments/MD notified:Dr. Vernell Morgans, Wynona Meals  Remember to initiate Hypoglycemia Order Set & complete

## 2012-08-01 NOTE — ED Notes (Signed)
Patient from home via [facility], per EMS patient has hx of hypoglycemia was just discharged for same issue.  On arrival patient's CBG was 35, paramedic gave patient 25mL of D10 and patient's CBG went up to 148.  Patient is lethargic at this time CBG 112.  Will continue to monitor.

## 2012-08-01 NOTE — ED Notes (Signed)
Pt states he is feeling hot and nauseated. Blood sugar will check.

## 2012-08-01 NOTE — Progress Notes (Addendum)
Subjective: Patient is sitting up in bed, alert, and has no complaints.   Objective: Vital signs in last 24 hours: Filed Vitals:   08/01/12 0530 08/01/12 0600 08/01/12 0615 08/01/12 0710  BP: 137/60 132/64 184/74 165/65  Pulse: 75 77 99 84  Temp:    98 F (36.7 C)  TempSrc:    Oral  Resp: 18 17 21 17   Height:    5\' 7"  (1.702 m)  Weight:    89.3 kg (196 lb 13.9 oz)  SpO2: 100% 100% 100% 98%    Intake/Output Summary (Last 24 hours) at 08/01/12 1013 Last data filed at 08/01/12 0745  Gross per 24 hour  Intake    795 ml  Output    125 ml  Net    670 ml    Weight change:   Physical exam:  General: Obese 79 year old Caucasian man sitting up in bed, alert, in no acute distress. Lungs: Clear to auscultation bilaterally. Heart: S1, S2, with a soft systolic murmur. Abdomen: Obese, positive bowel sounds, soft, nontender, nondistended. Extremities: 1+ pitting edema on the left leg and no edema on the right. Neurologic: He is alert and oriented x2. Cranial nerves II through XII are intact. No signs of diaphoresis or tremulousness.   Lab Results: Basic Metabolic Panel:  Recent Labs  45/40/98 0249 08/01/12 0300  NA 137 137  K 4.9 4.9  CL 103 104  CO2 24  --   GLUCOSE 52* 52*  BUN 47* 45*  CREATININE 2.23* 2.10*  CALCIUM 8.9  --    Liver Function Tests:  Recent Labs  08/01/12 0249  AST 17  ALT 17  ALKPHOS 82  BILITOT 0.3  PROT 6.8  ALBUMIN 3.0*   No results found for this basename: LIPASE, AMYLASE,  in the last 72 hours No results found for this basename: AMMONIA,  in the last 72 hours CBC:  Recent Labs  08/01/12 0249 08/01/12 0300  WBC 7.8  --   NEUTROABS 6.0  --   HGB 10.7* 10.5*  HCT 30.5* 31.0*  MCV 82.9  --   PLT 168  --    Cardiac Enzymes:  Recent Labs  08/01/12 0250  TROPONINI <0.30   BNP: No results found for this basename: PROBNP,  in the last 72 hours D-Dimer: No results found for this basename: DDIMER,  in the last 72  hours CBG:  Recent Labs  08/01/12 0638 08/01/12 0706 08/01/12 0744 08/01/12 0844 08/01/12 0936 08/01/12 0959  GLUCAP 92 49* 57* 47* 50* 38*   Hemoglobin A1C: No results found for this basename: HGBA1C,  in the last 72 hours Fasting Lipid Panel: No results found for this basename: CHOL, HDL, LDLCALC, TRIG, CHOLHDL, LDLDIRECT,  in the last 72 hours Thyroid Function Tests: No results found for this basename: TSH, T4TOTAL, FREET4, T3FREE, THYROIDAB,  in the last 72 hours Anemia Panel: No results found for this basename: VITAMINB12, FOLATE, FERRITIN, TIBC, IRON, RETICCTPCT,  in the last 72 hours Coagulation: No results found for this basename: LABPROT, INR,  in the last 72 hours Urine Drug Screen: Drugs of Abuse  No results found for this basename: labopia, cocainscrnur, labbenz, amphetmu, thcu, labbarb    Alcohol Level: No results found for this basename: ETH,  in the last 72 hours Urinalysis: No results found for this basename: COLORURINE, APPERANCEUR, LABSPEC, PHURINE, GLUCOSEU, HGBUR, BILIRUBINUR, KETONESUR, PROTEINUR, UROBILINOGEN, NITRITE, LEUKOCYTESUR,  in the last 72 hours Misc. Labs:   Micro: Recent Results (from the past 240 hour(s))  URINE CULTURE     Status: None   Collection Time    07/25/12  7:44 PM      Result Value Range Status   Specimen Description URINE, CLEAN CATCH   Final   Special Requests ADDED [id number]   Final   Culture  Setup Time 07/25/2012 23:05   Final   Colony Count NO GROWTH   Final   Culture NO GROWTH   Final   Report Status 07/27/2012 FINAL   Final  MRSA PCR SCREENING     Status: None   Collection Time    07/26/12  6:07 AM      Result Value Range Status   MRSA by PCR NEGATIVE  NEGATIVE Final   Comment:            The GeneXpert MRSA Assay (FDA     approved for NASAL specimens     only), is one component of a     comprehensive MRSA colonization     surveillance program. It is not     intended to diagnose MRSA     infection nor to  guide or     monitor treatment for     MRSA infections.    Studies/Results: No results found.  Medications:  Scheduled: . aspirin EC  81 mg Oral QHS  . irbesartan  150 mg Oral Q lunch   And  . hydrochlorothiazide  12.5 mg Oral Q lunch  . multivitamin with minerals  1 tablet Oral Daily  . simvastatin  10 mg Oral Q lunch   Continuous: . dextrose 5 % and 0.9% NaCl 75 mL/hr at 08/01/12 0319   EAV:[id number], glucose-Vitamin C, glucose-Vitamin C  Assessment: Principal Problem:   Hypoglycemia Active Problems:   Diabetes mellitus without complication   Hypertension   CKD (chronic kidney disease) stage 3, GFR 30-59 ml/min   Anemia   1. Recurrent hypoglycemia in the setting of type 2 diabetes mellitus. This is likely secondary to oral hypoglycemic agent, Amaryl. Amaryl will be discontinued indefinitely. Will be a bit more aggressive in treating his hypoglycemia as his blood sugars are still far below normal.  Type 2 diabetes mellitus. We'll order hemoglobin A1c. He says that he has a "diabetes doctor" but cannot remember his name. He has been treated with insulin in the past. Insulin may be a better treatment option long-term.  Chronic kidney disease. His baseline creatinine is 1.8-2.0. He is about at baseline.  Anemia. 4 weeks ago, his total iron was 45, vitamin B12 285, and ferritin to 28. It is likely he has anemia of chronic disease.  Hypertension. Not optimal, but stable. We'll continue ARB/HCTZ.  Morbid obesity.  Left leg edema. The patient says that his left leg is always swollen secondary to previous surgery.  Plan:  1. We'll give a dose of glucagon and change IV fluids to D10. Continue to monitor his capillary blood glucose every hour. 2. We'll order hemoglobin A1c and TSH for further evaluation. 3. We'll check an ultrasound of his left leg to rule out DVT.   LOS: 0 days   [name] 08/01/2012, 10:13 AM

## 2012-08-01 NOTE — Progress Notes (Signed)
Hypoglycemic Event  CBG: 48  Treatment: D50 IV 50 mL  Symptoms: None  Follow-up CBG: Time:1653 CBG Result:104  Possible Reasons for Event: Unknown  Comments/MD notified:Dr. Vernell Morgans, Wynona Meals  Remember to initiate Hypoglycemia Order Set & complete
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
## 2012-08-02 LAB — BASIC METABOLIC PANEL
Calcium: 8.9 mg/dL (ref 8.4–10.5)
GFR calc Af Amer: 38 mL/min — ABNORMAL LOW (ref >90)
GFR calc non Af Amer: 32 mL/min — ABNORMAL LOW (ref >90)
Glucose, Bld: 161 mg/dL — ABNORMAL HIGH (ref 70–99)
Sodium: 133 mEq/L — ABNORMAL LOW (ref 135–145)

## 2012-08-02 LAB — GLUCOSE, CAPILLARY
Glucose-Capillary: 111 mg/dL — ABNORMAL HIGH (ref 70–99)
Glucose-Capillary: 119 mg/dL — ABNORMAL HIGH (ref 70–99)
Glucose-Capillary: 129 mg/dL — ABNORMAL HIGH (ref 70–99)
Glucose-Capillary: 139 mg/dL — ABNORMAL HIGH (ref 70–99)
Glucose-Capillary: 148 mg/dL — ABNORMAL HIGH (ref 70–99)
Glucose-Capillary: 175 mg/dL — ABNORMAL HIGH (ref 70–99)
Glucose-Capillary: 84 mg/dL (ref 70–99)

## 2012-08-02 LAB — CBC
Hemoglobin: 10.7 g/dL — ABNORMAL LOW (ref 13.0–17.0)
MCH: 28.8 pg (ref 26.0–34.0)
MCHC: 35.1 g/dL (ref 30.0–36.0)
Platelets: 163 10*3/uL (ref 150–400)
RDW: 13.6 % (ref 11.5–15.5)

## 2012-08-02 MED ORDER — ENOXAPARIN SODIUM 40 MG/0.4ML ~~LOC~~ SOLN
40.0000 mg | SUBCUTANEOUS | Status: DC
  Filled 2012-08-02: qty 0.4

## 2012-08-02 MED ORDER — SODIUM CHLORIDE 0.9 % IV SOLN
INTRAVENOUS | Status: DC
  Administered 2012-08-02: 09:00:00 via INTRAVENOUS

## 2012-08-02 MED ORDER — INSULIN ASPART 100 UNIT/ML ~~LOC~~ SOLN: 0.0000 [IU] | SUBCUTANEOUS | Status: DC

## 2012-08-02 MED ORDER — INSULIN ASPART 100 UNIT/ML ~~LOC~~ SOLN: SUBCUTANEOUS | Status: DC

## 2012-08-02 MED ORDER — SODIUM CHLORIDE 0.9 % IJ SOLN
INTRAMUSCULAR | Status: AC
  Filled 2012-08-02: qty 30

## 2012-08-02 NOTE — Care Management Note (Signed)
    Page 1 of 2   08/02/2012     3:06:52 PM   CARE MANAGEMENT NOTE 08/02/2012  Patient:  [name], [name]   Account Number:  [id number]  Date Initiated:  08/02/2012  Documentation initiated by:  Donn Pierini  Subjective/Objective Assessment:   Pt admitted with hypoglycemia     Action/Plan:   PTA pt lived at home with wife/daughter   Anticipated DC Date:  08/02/2012   Anticipated DC Plan:  HOME W HOME HEALTH SERVICES      DC Planning Services  CM consult      [facility] Choice  HOME HEALTH   Choice offered to / List presented to:  C-1 Patient        HH arranged  HH-1 RN      [facility] agency  [facility]   Status of service:  Completed, signed off Medicare Important Message given?   (If response is "NO", the following Medicare IM given date fields will be blank) Date Medicare IM given:   Date Additional Medicare IM given:    Discharge Disposition:  HOME W HOME HEALTH SERVICES  Per UR Regulation:  Reviewed for med. necessity/level of care/duration of stay  If discussed at Long Length of Stay Meetings, dates discussed:    Comments:  08/02/12- 1100- Donn Pierini RN, BSN [phone] Pt for possible d/c today- spoke with pt at bedside- per conversation pt states that he lives at home with wife and daughter who also has young children. Pt reports that he has a RW at home and that he has had HH in past and it was helpful. Pt is agreeale to having HH again for medication management- per pt he would like to use [facility] for [facility] services- referral call made to Lupita Leash with [facility] to - awaiting MD order for HH-RN-  services will begin 24-48 post discharge.
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
## 2012-08-02 NOTE — Progress Notes (Signed)
Discussed discharge instructions and medications with patient and daughter. Both verbalized understanding with all questions answered. VSS. Pt discharged home with daughter. Home health RN set up to visit.  [facility]

## 2012-08-02 NOTE — Progress Notes (Signed)
Pt's CBG's have been within range since 1630 yesterday.  Will continue to monitor.  Vivi Martens RN

## 2012-11-08 ENCOUNTER — Other Ambulatory Visit: Payer: Self-pay | Admitting: Internal Medicine

## 2012-11-08 DIAGNOSIS — N882 Stricture and stenosis of cervix uteri: Secondary | ICD-10-CM

## 2012-11-08 DIAGNOSIS — R0989 Other specified symptoms and signs involving the circulatory and respiratory systems: Secondary | ICD-10-CM

## 2012-11-21 ENCOUNTER — Ambulatory Visit
Admission: RE | Admit: 2012-11-21 | Discharge: 2012-11-21 | Disposition: A | Discharge status: Home / Self Care | Payer: Medicare Other | Source: Ambulatory Visit | Attending: Internal Medicine | Admitting: Internal Medicine

## 2012-11-21 DIAGNOSIS — R0989 Other specified symptoms and signs involving the circulatory and respiratory systems: Secondary | ICD-10-CM

## 2012-12-06 ENCOUNTER — Ambulatory Visit: Payer: Medicare Other | Admitting: Cardiovascular Disease

## 2013-09-23 ENCOUNTER — Emergency Department (HOSPITAL_COMMUNITY)
Admission: EM | Admit: 2013-09-23 | Discharge: 2013-09-24 | Disposition: A | Discharge status: Home / Self Care | Payer: Medicare Other | Attending: Emergency Medicine | Admitting: Emergency Medicine

## 2013-09-23 ENCOUNTER — Encounter (HOSPITAL_COMMUNITY): Payer: Self-pay | Admitting: Emergency Medicine

## 2013-09-23 DIAGNOSIS — Y939 Activity, unspecified: Secondary | ICD-10-CM | POA: Diagnosis not present

## 2013-09-23 DIAGNOSIS — IMO0002 Reserved for concepts with insufficient information to code with codable children: Secondary | ICD-10-CM | POA: Diagnosis not present

## 2013-09-23 DIAGNOSIS — Z8673 Personal history of transient ischemic attack (TIA), and cerebral infarction without residual deficits: Secondary | ICD-10-CM | POA: Insufficient documentation

## 2013-09-23 DIAGNOSIS — R55 Syncope and collapse: Secondary | ICD-10-CM | POA: Diagnosis not present

## 2013-09-23 DIAGNOSIS — Y929 Unspecified place or not applicable: Secondary | ICD-10-CM | POA: Insufficient documentation

## 2013-09-23 DIAGNOSIS — X30XXXA Exposure to excessive natural heat, initial encounter: Secondary | ICD-10-CM | POA: Insufficient documentation

## 2013-09-23 DIAGNOSIS — Z862 Personal history of diseases of the blood and blood-forming organs and certain disorders involving the immune mechanism: Secondary | ICD-10-CM | POA: Insufficient documentation

## 2013-09-23 DIAGNOSIS — N183 Chronic kidney disease, stage 3 unspecified: Secondary | ICD-10-CM | POA: Insufficient documentation

## 2013-09-23 DIAGNOSIS — T678XXA Other effects of heat and light, initial encounter: Secondary | ICD-10-CM | POA: Insufficient documentation

## 2013-09-23 DIAGNOSIS — Z794 Long term (current) use of insulin: Secondary | ICD-10-CM | POA: Diagnosis not present

## 2013-09-23 DIAGNOSIS — I129 Hypertensive chronic kidney disease with stage 1 through stage 4 chronic kidney disease, or unspecified chronic kidney disease: Secondary | ICD-10-CM | POA: Diagnosis not present

## 2013-09-23 DIAGNOSIS — E860 Dehydration: Secondary | ICD-10-CM

## 2013-09-23 DIAGNOSIS — Z79899 Other long term (current) drug therapy: Secondary | ICD-10-CM | POA: Diagnosis not present

## 2013-09-23 DIAGNOSIS — Z7982 Long term (current) use of aspirin: Secondary | ICD-10-CM | POA: Insufficient documentation

## 2013-09-23 DIAGNOSIS — T679XXA Effect of heat and light, unspecified, initial encounter: Secondary | ICD-10-CM

## 2013-09-23 DIAGNOSIS — R404 Transient alteration of awareness: Secondary | ICD-10-CM | POA: Diagnosis present

## 2013-09-23 DIAGNOSIS — E119 Type 2 diabetes mellitus without complications: Secondary | ICD-10-CM | POA: Diagnosis not present

## 2013-09-23 HISTORY — DX: Cerebral infarction, unspecified: I63.9

## 2013-09-23 LAB — COMPREHENSIVE METABOLIC PANEL
ALK PHOS: 87 U/L (ref 39–117)
ALT: 15 U/L (ref 0–53)
AST: 16 U/L (ref 0–37)
Albumin: 3.6 g/dL (ref 3.5–5.2)
BILIRUBIN TOTAL: 0.7 mg/dL (ref 0.3–1.2)
BUN: 67 mg/dL — ABNORMAL HIGH (ref 6–23)
CHLORIDE: 103 meq/L (ref 96–112)
CO2: 24 meq/L (ref 19–32)
CREATININE: 2.39 mg/dL — AB (ref 0.50–1.35)
Calcium: 9.5 mg/dL (ref 8.4–10.5)
GFR calc Af Amer: 28 mL/min — ABNORMAL LOW (ref >90)
GFR, EST NON AFRICAN AMERICAN: 24 mL/min — AB (ref >90)
Glucose, Bld: 78 mg/dL (ref 70–99)
Potassium: 4.7 mEq/L (ref 3.7–5.3)
SODIUM: 142 meq/L (ref 137–147)
Total Protein: 7.6 g/dL (ref 6.0–8.3)

## 2013-09-23 LAB — CBC
HCT: 31.8 % — ABNORMAL LOW (ref 39.0–52.0)
Hemoglobin: 10.3 g/dL — ABNORMAL LOW (ref 13.0–17.0)
MCH: 26.8 pg (ref 26.0–34.0)
MCHC: 32.4 g/dL (ref 30.0–36.0)
MCV: 82.8 fL (ref 78.0–100.0)
PLATELETS: 134 10*3/uL — AB (ref 150–400)
RBC: 3.84 MIL/uL — AB (ref 4.22–5.81)
RDW: 14.8 % (ref 11.5–15.5)
WBC: 7.5 10*3/uL (ref 4.0–10.5)

## 2013-09-23 LAB — CBG MONITORING, ED: Glucose-Capillary: 73 mg/dL (ref 70–99)

## 2013-09-23 LAB — I-STAT TROPONIN, ED: Troponin i, poc: 0.02 ng/mL (ref 0.00–0.08)
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
## 2013-09-23 NOTE — ED Notes (Addendum)
Per EMS - pt from home, pt w/ syncopal episode earlier today, per pt's family pt w/ AMS - pt slow to respond and unable to recall year. Pt admits to fall today d/t "a sugar spell." EMS reports the residence was hot in temperature and pt was diaphoretic on EMS arrival. Pt denies any complaints at present, pt admits to nausea just prior to syncopal episode. Pt alert and oriented to person, place, and situation however unable to recall today's date. Per pt's family, pt normally doesn't remember the date after "an event like today," pt w/ hx of CVA and admits to rt leg weakness x1 year. Pt's family at bedside reports the last time pt experienced symptoms similar to this pt was experiencing CVA.

## 2013-09-24 ENCOUNTER — Encounter (HOSPITAL_COMMUNITY): Payer: Self-pay | Admitting: Radiology

## 2013-09-24 ENCOUNTER — Emergency Department (HOSPITAL_COMMUNITY): Payer: Medicare Other

## 2013-09-24 DIAGNOSIS — R55 Syncope and collapse: Secondary | ICD-10-CM | POA: Diagnosis not present

## 2013-09-24 LAB — URINALYSIS, ROUTINE W REFLEX MICROSCOPIC
BILIRUBIN URINE: NEGATIVE
Glucose, UA: NEGATIVE mg/dL
HGB URINE DIPSTICK: NEGATIVE
KETONES UR: NEGATIVE mg/dL
Leukocytes, UA: NEGATIVE
Nitrite: NEGATIVE
PROTEIN: NEGATIVE mg/dL
Specific Gravity, Urine: 1.021 (ref 1.005–1.030)
UROBILINOGEN UA: 0.2 mg/dL (ref 0.0–1.0)
pH: 5 (ref 5.0–8.0)

## 2013-09-24 MED ORDER — SODIUM CHLORIDE 0.9 % IV BOLUS (SEPSIS)
500.0000 mL | Freq: Once | INTRAVENOUS | Status: AC
  Administered 2013-09-24: 500 mL via INTRAVENOUS

## 2013-09-24 NOTE — ED Notes (Signed)
D/c instructions reviewed w/ pt and family - pt and family deny any further questions or concerns at present. Pt ambulating w/ assistanced, d/c'd via wheelchair, pt's son to drive pt home. Pt is A&Ox4 and in no acute distress upon discharge.

## 2013-09-24 NOTE — Discharge Instructions (Signed)
Make appointment to followup with your primary MD. Drink plenty of fluids. Stay in a cool place during the day. Change positions slowly. Return immediately for dizziness, chest pain, weakness or for any concerns.  Dehydration, Adult Dehydration means your body does not have as much fluid as it needs. Your kidneys, brain, and heart will not work properly without the right amount of fluids and salt.  HOME CARE  Ask your doctor how to replace body fluid losses (rehydrate).  Drink enough fluids to keep your pee (urine) clear or pale yellow.  Drink small amounts of fluids often if you feel sick to your stomach (nauseous) or throw up (vomit).  Eat like you normally do.  Avoid:  Foods or drinks high in sugar.  Bubbly (carbonated) drinks.  Juice.  Very hot or cold fluids.  Drinks with caffeine.  Fatty, greasy foods.  Alcohol.  Tobacco.  Eating too much.  Gelatin desserts.  Wash your hands to avoid spreading germs (bacteria, viruses).  Only take medicine as told by your doctor.  Keep all doctor visits as told. GET HELP RIGHT AWAY IF:   You cannot drink something without throwing up.  You get worse even with treatment.  Your vomit has blood in it or looks greenish.  Your poop (stool) has blood in it or looks black and tarry.  You have not peed in 6 to 8 hours.  You pee a small amount of very dark pee.  You have a fever.  You pass out (faint).  You have belly (abdominal) pain that gets worse or stays in one spot (localizes).  You have a rash, stiff neck, or bad headache.  You get easily annoyed, sleepy, or are hard to wake up.  You feel weak, dizzy, or very thirsty. MAKE SURE YOU:   Understand these instructions.  Will watch your condition.  Will get help right away if you are not doing well or get worse. Document Released: 01/14/2009 Document Revised: 06/12/2011 Document Reviewed: 11/07/2010 [facility] Patient Information 2015 [place], [place]. This  information is not intended to replace advice given to you by your health care provider. Make sure you discuss any questions you have with your health care provider.  Heat Stress in the Elderly  Elderly people (people aged 53 years and older) are more prone to heat stress than younger people for several reasons:   Elderly people do not adjust as well as young people to sudden changes in temperature.  They are more likely to have a chronic medical condition that upsets normal body responses to heat.  They are more likely to take prescription medicines that impair the body's ability to regulate its temperature or that inhibit perspiration. HEAT STROKE  Heat stroke is the most serious heat-related illness. It occurs when the body becomes unable to control its temperature. The body temperature rises rapidly. Then the body loses its ability to sweat and is unable to cool down. The body temperature rises to 105 F (40.6 C) or higher within 10 to 15 minutes. Heat stroke can cause death or permanent disability if emergency treatment is not provided. SYMPTOMS  Warning signs vary but may include the following:  An extremely high body temperature (above 103 F (39.4 C)).  Nausea.  Red, hot, and dry skin (no sweating).  Rapid, strong pulse.  Throbbing headache.  Dizziness. HEAT EXHAUSTION  Heat exhaustion is a milder form of heat-related illness. It can develop after several days of exposure to high temperatures and not enough fluids. SYMPTOMS  Warning signs vary but may include the following:   Heavy sweating. Paleness.  Muscle cramps.  Tiredness. Weakness.  Dizziness.  Headache. Nausea or vomiting.  Fainting.  Skin: may be cool and moist.  Pulse rate: fast and weak.  Breathing: fast and shallow. WHAT YOU CAN DO TO PROTECT YOURSELF  You can follow these prevention tips to protect yourself from heat-related stress:   Drink cool, nonalcoholic, non-caffeinated beverages. If  your caregiver generally limits the amount of fluid you drink or has you on water pills, ask how much you should drink when the weather is hot. Avoid extremely cold liquids. They can cause cramps.  Rest.  Take a cool shower, bath, or sponge bath.  If possible, seek an air-conditioned environment. If you do not have air conditioning, visit an air-conditioned shopping mall or public library to cool off.  Financial risk analyst.  If possible, remain indoors in the heat of the day.  Do not engage in strenuous activities. WHAT YOU CAN DO TO HELP PROTECT ELDERLY RELATIVES AND NEIGHBORS  If you have elderly relatives or neighbors, help them protect themselves from heat-related stress.   Visit older adults at risk at least twice a day. Watch them for signs of heat exhaustion or heat stroke.  Take them to air-conditioned locations if they have transportation problems.  Make sure older adults have access to an electric fan whenever possible. WHAT YOU CAN DO FOR SOMEONE WITH HEAT STRESS   If you see any signs of severe heat stress, you may be dealing with a life-threatening emergency. Have someone call for immediate medical assistance while you begin cooling the affected person. Do the following:  Get the person to a shady area.  Cool the person rapidly, using whatever methods you can. For example, immerse the person in a tub of cool water or place the person in a cool shower. Spray the person with cool water from a garden hose or sponge the person with cool water. If the humidity is low, wrap the person in a cool, wet sheet. Fan him/her quickly.  Monitor body temperature. Continue cooling efforts until the body temperature drops to 101 - 102F (38.3  C - 38.9  C).  If emergency medical personnel are delayed, call the hospital emergency room for further instructions.  Do not give the person alcohol to drink.  Get medical care as soon as possible. Document Released: 03/08/2009 Document  Revised: 06/12/2011 Document Reviewed: 03/08/2009 [facility] Patient Information 2015 [place], [place]. This information is not intended to replace advice given to you by your health care provider. Make sure you discuss any questions you have with your health care provider.  Syncope Syncope is a medical term for fainting or passing out. This means you lose consciousness and drop to the ground. People are generally unconscious for less than 5 minutes. You may have some muscle twitches for up to 15 seconds before waking up and returning to normal. Syncope occurs more often in older adults, but it can happen to anyone. While most causes of syncope are not dangerous, syncope can be a sign of a serious medical problem. It is important to seek medical care.  CAUSES  Syncope is caused by a sudden drop in blood flow to the brain. The specific cause is often not determined. Factors that can bring on syncope include:  Taking medicines that lower blood pressure.  Sudden changes in posture, such as standing up quickly.  Taking more medicine than prescribed.  Standing in one place  for too long.  Seizure disorders.  Dehydration and excessive exposure to heat.  Low blood sugar (hypoglycemia).  Straining to have a bowel movement.  Heart disease, irregular heartbeat, or other circulatory problems.  Fear, emotional distress, seeing blood, or severe pain. SYMPTOMS  Right before fainting, you may:  Feel dizzy or light-headed.  Feel nauseous.  See all white or all black in your field of vision.  Have cold, clammy skin. DIAGNOSIS  Your health care provider will ask about your symptoms, perform a physical exam, and perform an electrocardiogram (ECG) to record the electrical activity of your heart. Your health care provider may also perform other heart or blood tests to determine the cause of your syncope which may include:  Transthoracic echocardiogram (TTE). During echocardiography, sound waves are  used to evaluate how blood flows through your heart.  Transesophageal echocardiogram (TEE).  Cardiac monitoring. This allows your health care provider to monitor your heart rate and rhythm in real time.  Holter monitor. This is a portable device that records your heartbeat and can help diagnose heart arrhythmias. It allows your health care provider to track your heart activity for several days, if needed.  Stress tests by exercise or by giving medicine that makes the heart beat faster. TREATMENT  In most cases, no treatment is needed. Depending on the cause of your syncope, your health care provider may recommend changing or stopping some of your medicines. HOME CARE INSTRUCTIONS  Have someone stay with you until you feel stable.  Do not drive, use machinery, or play sports until your health care provider says it is okay.  Keep all follow-up appointments as directed by your health care provider.  Lie down right away if you start feeling like you might faint. Breathe deeply and steadily. Wait until all the symptoms have passed.  Drink enough fluids to keep your urine clear or pale yellow.  If you are taking blood pressure or heart medicine, get up slowly and take several minutes to sit and then stand. This can reduce dizziness. SEEK IMMEDIATE MEDICAL CARE IF:   You have a severe headache.  You have unusual pain in the chest, abdomen, or back.  You are bleeding from your mouth or rectum, or you have black or tarry stool.  You have an irregular or very fast heartbeat.  You have pain with breathing.  You have repeated fainting or seizure-like jerking during an episode.  You faint when sitting or lying down.  You have confusion.  You have trouble walking.  You have severe weakness.  You have vision problems. If you fainted, call your local emergency services (911 in [place] Do not drive yourself to the hospital.  MAKE SURE YOU:  Understand these instructions.  Will watch  your condition.  Will get help right away if you are not doing well or get worse. Document Released: 03/20/2005 Document Revised: 03/25/2013 Document Reviewed: 05/19/2011 [facility] Patient Information 2015 [place]. This information is not intended to replace advice given to you by your health care provider. Make sure you discuss any questions you have with your health care provider.

## 2013-09-30 ENCOUNTER — Encounter (HOSPITAL_COMMUNITY): Payer: Self-pay | Admitting: Emergency Medicine

## 2013-09-30 ENCOUNTER — Inpatient Hospital Stay (HOSPITAL_COMMUNITY)
Admission: AD | Admit: 2013-09-30 | Discharge: 2013-10-03 | DRG: 314 | Disposition: A | Discharge status: Home Health | Payer: Medicare Other | Attending: Internal Medicine | Admitting: Internal Medicine

## 2013-09-30 DIAGNOSIS — R42 Dizziness and giddiness: Secondary | ICD-10-CM | POA: Diagnosis present

## 2013-09-30 DIAGNOSIS — Z889 Allergy status to unspecified drugs, medicaments and biological substances status: Secondary | ICD-10-CM

## 2013-09-30 DIAGNOSIS — R931 Abnormal findings on diagnostic imaging of heart and coronary circulation: Secondary | ICD-10-CM

## 2013-09-30 DIAGNOSIS — I129 Hypertensive chronic kidney disease with stage 1 through stage 4 chronic kidney disease, or unspecified chronic kidney disease: Secondary | ICD-10-CM | POA: Diagnosis present

## 2013-09-30 DIAGNOSIS — N183 Chronic kidney disease, stage 3 unspecified: Secondary | ICD-10-CM | POA: Diagnosis not present

## 2013-09-30 DIAGNOSIS — I314 Cardiac tamponade: Secondary | ICD-10-CM | POA: Diagnosis present

## 2013-09-30 DIAGNOSIS — E119 Type 2 diabetes mellitus without complications: Secondary | ICD-10-CM | POA: Diagnosis present

## 2013-09-30 DIAGNOSIS — Z9181 History of falling: Secondary | ICD-10-CM

## 2013-09-30 DIAGNOSIS — I951 Orthostatic hypotension: Secondary | ICD-10-CM | POA: Diagnosis present

## 2013-09-30 DIAGNOSIS — D638 Anemia in other chronic diseases classified elsewhere: Secondary | ICD-10-CM | POA: Diagnosis not present

## 2013-09-30 DIAGNOSIS — F0390 Unspecified dementia without behavioral disturbance: Secondary | ICD-10-CM | POA: Diagnosis present

## 2013-09-30 DIAGNOSIS — L97509 Non-pressure chronic ulcer of other part of unspecified foot with unspecified severity: Secondary | ICD-10-CM | POA: Diagnosis present

## 2013-09-30 DIAGNOSIS — Z833 Family history of diabetes mellitus: Secondary | ICD-10-CM

## 2013-09-30 DIAGNOSIS — I6350 Cerebral infarction due to unspecified occlusion or stenosis of unspecified cerebral artery: Secondary | ICD-10-CM | POA: Diagnosis present

## 2013-09-30 DIAGNOSIS — E1129 Type 2 diabetes mellitus with other diabetic kidney complication: Secondary | ICD-10-CM

## 2013-09-30 DIAGNOSIS — R55 Syncope and collapse: Secondary | ICD-10-CM | POA: Diagnosis not present

## 2013-09-30 DIAGNOSIS — I10 Essential (primary) hypertension: Secondary | ICD-10-CM

## 2013-09-30 DIAGNOSIS — Z8249 Family history of ischemic heart disease and other diseases of the circulatory system: Secondary | ICD-10-CM

## 2013-09-30 DIAGNOSIS — E860 Dehydration: Secondary | ICD-10-CM | POA: Diagnosis present

## 2013-09-30 DIAGNOSIS — K5909 Other constipation: Secondary | ICD-10-CM | POA: Diagnosis present

## 2013-09-30 DIAGNOSIS — Z8673 Personal history of transient ischemic attack (TIA), and cerebral infarction without residual deficits: Secondary | ICD-10-CM

## 2013-09-30 DIAGNOSIS — I313 Pericardial effusion (noninflammatory): Secondary | ICD-10-CM

## 2013-09-30 DIAGNOSIS — I739 Peripheral vascular disease, unspecified: Secondary | ICD-10-CM | POA: Diagnosis present

## 2013-09-30 DIAGNOSIS — Z794 Long term (current) use of insulin: Secondary | ICD-10-CM

## 2013-09-30 DIAGNOSIS — I6529 Occlusion and stenosis of unspecified carotid artery: Secondary | ICD-10-CM | POA: Diagnosis present

## 2013-09-30 DIAGNOSIS — M7989 Other specified soft tissue disorders: Secondary | ICD-10-CM | POA: Diagnosis present

## 2013-09-30 DIAGNOSIS — E785 Hyperlipidemia, unspecified: Secondary | ICD-10-CM | POA: Diagnosis present

## 2013-09-30 DIAGNOSIS — Z79899 Other long term (current) drug therapy: Secondary | ICD-10-CM

## 2013-09-30 DIAGNOSIS — I319 Disease of pericardium, unspecified: Principal | ICD-10-CM | POA: Diagnosis present

## 2013-09-30 DIAGNOSIS — Z888 Allergy status to other drugs, medicaments and biological substances status: Secondary | ICD-10-CM

## 2013-09-30 DIAGNOSIS — E1169 Type 2 diabetes mellitus with other specified complication: Secondary | ICD-10-CM | POA: Diagnosis present

## 2013-09-30 DIAGNOSIS — R4189 Other symptoms and signs involving cognitive functions and awareness: Secondary | ICD-10-CM

## 2013-09-30 DIAGNOSIS — I639 Cerebral infarction, unspecified: Secondary | ICD-10-CM | POA: Diagnosis present

## 2013-09-30 DIAGNOSIS — I3139 Other pericardial effusion (noninflammatory): Secondary | ICD-10-CM

## 2013-09-30 DIAGNOSIS — T4275XA Adverse effect of unspecified antiepileptic and sedative-hypnotic drugs, initial encounter: Secondary | ICD-10-CM | POA: Diagnosis present

## 2013-09-30 LAB — CBC WITH DIFFERENTIAL/PLATELET
BASOS PCT: 0 % (ref 0–1)
Basophils Absolute: 0 10*3/uL (ref 0.0–0.1)
EOS ABS: 0.1 10*3/uL (ref 0.0–0.7)
Eosinophils Relative: 1 % (ref 0–5)
HEMATOCRIT: 28.8 % — AB (ref 39.0–52.0)
HEMOGLOBIN: 9.6 g/dL — AB (ref 13.0–17.0)
LYMPHS ABS: 0.7 10*3/uL (ref 0.7–4.0)
Lymphocytes Relative: 12 % (ref 12–46)
MCH: 27.1 pg (ref 26.0–34.0)
MCHC: 33.3 g/dL (ref 30.0–36.0)
MCV: 81.4 fL (ref 78.0–100.0)
MONO ABS: 0.5 10*3/uL (ref 0.1–1.0)
MONOS PCT: 9 % (ref 3–12)
Neutro Abs: 4.7 10*3/uL (ref 1.7–7.7)
Neutrophils Relative %: 78 % — ABNORMAL HIGH (ref 43–77)
Platelets: 138 10*3/uL — ABNORMAL LOW (ref 150–400)
RBC: 3.54 MIL/uL — ABNORMAL LOW (ref 4.22–5.81)
RDW: 14.8 % (ref 11.5–15.5)
WBC: 6 10*3/uL (ref 4.0–10.5)

## 2013-09-30 LAB — BASIC METABOLIC PANEL
BUN: 59 mg/dL — AB (ref 6–23)
CHLORIDE: 98 meq/L (ref 96–112)
CO2: 23 mEq/L (ref 19–32)
CREATININE: 2.01 mg/dL — AB (ref 0.50–1.35)
Calcium: 8.9 mg/dL (ref 8.4–10.5)
GFR, EST AFRICAN AMERICAN: 35 mL/min — AB (ref >90)
GFR, EST NON AFRICAN AMERICAN: 30 mL/min — AB (ref >90)
GLUCOSE: 122 mg/dL — AB (ref 70–99)
Potassium: 4.7 mEq/L (ref 3.7–5.3)
Sodium: 133 mEq/L — ABNORMAL LOW (ref 137–147)

## 2013-09-30 MED ORDER — ONDANSETRON HCL 4 MG PO TABS: 4.0000 mg | ORAL_TABLET | Freq: Four times a day (QID) | ORAL | Status: DC | PRN

## 2013-09-30 MED ORDER — SODIUM CHLORIDE 0.9 % IV SOLN
INTRAVENOUS | Status: DC
  Administered 2013-09-30 – 2013-10-02 (×3): via INTRAVENOUS

## 2013-09-30 MED ORDER — INSULIN ASPART 100 UNIT/ML ~~LOC~~ SOLN
0.0000 [IU] | Freq: Three times a day (TID) | SUBCUTANEOUS | Status: DC
  Administered 2013-10-01 – 2013-10-02 (×3): 1 [IU] via SUBCUTANEOUS

## 2013-09-30 MED ORDER — SIMVASTATIN 10 MG PO TABS
10.0000 mg | ORAL_TABLET | Freq: Every day | ORAL | Status: DC
  Administered 2013-10-01 – 2013-10-03 (×3): 10 mg via ORAL
  Filled 2013-09-30 (×3): qty 1

## 2013-09-30 MED ORDER — ACETAMINOPHEN 650 MG RE SUPP: 650.0000 mg | Freq: Four times a day (QID) | RECTAL | Status: DC | PRN

## 2013-09-30 MED ORDER — SODIUM CHLORIDE 0.9 % IV BOLUS (SEPSIS)
1000.0000 mL | Freq: Once | INTRAVENOUS | Status: AC
  Administered 2013-09-30: 1000 mL via INTRAVENOUS

## 2013-09-30 MED ORDER — HEPARIN SODIUM (PORCINE) 5000 UNIT/ML IJ SOLN
5000.0000 [IU] | Freq: Three times a day (TID) | INTRAMUSCULAR | Status: DC
  Administered 2013-10-01 – 2013-10-03 (×8): 5000 [IU] via SUBCUTANEOUS
  Filled 2013-09-30 (×11): qty 1

## 2013-09-30 MED ORDER — ACETAMINOPHEN 325 MG PO TABS: 650.0000 mg | ORAL_TABLET | Freq: Four times a day (QID) | ORAL | Status: DC | PRN

## 2013-09-30 MED ORDER — HYDROCODONE-ACETAMINOPHEN 5-325 MG PO TABS: 1.0000 | ORAL_TABLET | Freq: Four times a day (QID) | ORAL | Status: DC | PRN

## 2013-09-30 MED ORDER — ONDANSETRON HCL 4 MG/2ML IJ SOLN: 4.0000 mg | Freq: Four times a day (QID) | INTRAMUSCULAR | Status: DC | PRN

## 2013-09-30 NOTE — ED Notes (Signed)
Bed: WA16 Expected date:  Expected time:  Means of arrival:  Comments: EMS-dizziness 

## 2013-09-30 NOTE — Progress Notes (Signed)
  CARE MANAGEMENT ED NOTE 09/30/2013  Patient:  [name], [name]   Account Number:  [id number]  Date Initiated:  09/30/2013  Documentation initiated by:  Radford Pax  Subjective/Objective Assessment:   Patient presents to Ed with dizziness and unsteady gait when walking     Subjective/Objective Assessment Detail:   Patient with pmhx of hypertension, chronic kidney disease stage III, diabetes mellitus type 2 (on insulin therapy), hyperlipidemia and history of CVA (with mild residual numbness/weakness on right lower extremity.     Action/Plan:   Action/Plan Detail:   Anticipated DC Date:       Status Recommendation to Physician:   Result of Recommendation:    Other ED Services  Consult Working Plan    DC Planning Services  Other  PCP issues    Choice offered to / List presented to:            Status of service:  Completed, signed off  ED Comments:   ED Comments Detail:  EDCM spoke to patient at bedside.  Patient reports he ives at home with his wife.  Patient confirms he has had [facility] in the past, but doe snot have them anymore.  Patient reports he has a walker at home, "But it doesn't work for me."  Family member at bedside reports they tried to get the patient a walker with a seat on it but patient's insurance would not approve of it.  [facility] provided patient with list of pcps who accept Medicare insurance within a ten mile radius of patient's zip code [address].  Patient reports he has been having difficulty making a follow up appointment with his pcp.  EDCM also provided patient with list of home health agencies in [facility].  Patient thankful for resources.  No further EDCM needs at this time.

## 2013-09-30 NOTE — ED Notes (Signed)
Dr. Nanavati at bedside 

## 2013-09-30 NOTE — ED Notes (Signed)
Pt c/o of increased dizziness over the past couple of days. Also c/o of unsteady gait when walking. Few falls recently. Not orthostatic. Recent hospitalization for dehydration. AAO.
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
## 2013-10-01 ENCOUNTER — Observation Stay (HOSPITAL_COMMUNITY): Payer: Medicare Other

## 2013-10-01 DIAGNOSIS — R55 Syncope and collapse: Secondary | ICD-10-CM

## 2013-10-01 DIAGNOSIS — I639 Cerebral infarction, unspecified: Secondary | ICD-10-CM | POA: Diagnosis present

## 2013-10-01 DIAGNOSIS — I6529 Occlusion and stenosis of unspecified carotid artery: Secondary | ICD-10-CM

## 2013-10-01 LAB — IRON AND TIBC
IRON: 18 ug/dL — AB (ref 42–135)
SATURATION RATIOS: 11 % — AB (ref 20–55)
TIBC: 160 ug/dL — ABNORMAL LOW (ref 215–435)
UIBC: 142 ug/dL (ref 125–400)

## 2013-10-01 LAB — FOLATE

## 2013-10-01 LAB — CREATININE, SERUM
CREATININE: 1.82 mg/dL — AB (ref 0.50–1.35)
GFR calc Af Amer: 39 mL/min — ABNORMAL LOW (ref >90)
GFR calc non Af Amer: 34 mL/min — ABNORMAL LOW (ref >90)

## 2013-10-01 LAB — CBC
HEMATOCRIT: 30.4 % — AB (ref 39.0–52.0)
HEMOGLOBIN: 10.1 g/dL — AB (ref 13.0–17.0)
MCH: 27.1 pg (ref 26.0–34.0)
MCHC: 33.2 g/dL (ref 30.0–36.0)
MCV: 81.5 fL (ref 78.0–100.0)
Platelets: 151 10*3/uL (ref 150–400)
RBC: 3.73 MIL/uL — ABNORMAL LOW (ref 4.22–5.81)
RDW: 14.9 % (ref 11.5–15.5)
WBC: 5.8 10*3/uL (ref 4.0–10.5)

## 2013-10-01 LAB — VITAMIN B12: Vitamin B-12: 208 pg/mL — ABNORMAL LOW (ref 211–911)

## 2013-10-01 LAB — TSH: TSH: 1.28 u[IU]/mL (ref 0.350–4.500)

## 2013-10-01 LAB — GLUCOSE, CAPILLARY
GLUCOSE-CAPILLARY: 144 mg/dL — AB (ref 70–99)
Glucose-Capillary: 135 mg/dL — ABNORMAL HIGH (ref 70–99)
Glucose-Capillary: 110 mg/dL — ABNORMAL HIGH (ref 70–99)
Glucose-Capillary: 114 mg/dL — ABNORMAL HIGH (ref 70–99)

## 2013-10-01 LAB — LIPID PANEL
CHOLESTEROL: 83 mg/dL (ref 0–200)
HDL: 29 mg/dL — ABNORMAL LOW (ref >39)
LDL Cholesterol: 42 mg/dL (ref 0–99)
TRIGLYCERIDES: 58 mg/dL (ref <150)
Total CHOL/HDL Ratio: 2.9 RATIO
VLDL: 12 mg/dL (ref 0–40)

## 2013-10-01 LAB — CORTISOL: Cortisol, Plasma: 11.7 ug/dL

## 2013-10-01 LAB — FERRITIN: Ferritin: 260 ng/mL (ref 22–322)

## 2013-10-01 LAB — HEMOGLOBIN A1C
Hgb A1c MFr Bld: 6 % — ABNORMAL HIGH (ref <5.7)
MEAN PLASMA GLUCOSE: 126 mg/dL — AB (ref <117)

## 2013-10-01 LAB — PHOSPHORUS: Phosphorus: 2.7 mg/dL (ref 2.3–4.6)

## 2013-10-01 LAB — MAGNESIUM: Magnesium: 2.1 mg/dL (ref 1.5–2.5)

## 2013-10-01 MED ORDER — ZOLPIDEM TARTRATE 5 MG PO TABS
5.0000 mg | ORAL_TABLET | Freq: Every evening | ORAL | Status: DC | PRN
  Administered 2013-10-01 (×2): 5 mg via ORAL
  Filled 2013-10-01 (×2): qty 1

## 2013-10-01 MED ORDER — SENNOSIDES-DOCUSATE SODIUM 8.6-50 MG PO TABS
2.0000 | ORAL_TABLET | Freq: Every day | ORAL | Status: DC
  Administered 2013-10-01 – 2013-10-02 (×2): 2 via ORAL
  Filled 2013-10-01: qty 1
  Filled 2013-10-01: qty 2
  Filled 2013-10-01: qty 1
  Filled 2013-10-01: qty 2

## 2013-10-01 MED ORDER — POLYETHYLENE GLYCOL 3350 17 G PO PACK
17.0000 g | PACK | Freq: Every day | ORAL | Status: DC | PRN
Start: 2013-10-01 — End: 2013-10-03
  Administered 2013-10-01 (×2): 17 g via ORAL
  Filled 2013-10-01 (×3): qty 1

## 2013-10-01 MED ORDER — ASPIRIN EC 81 MG PO TBEC
81.0000 mg | DELAYED_RELEASE_TABLET | Freq: Every day | ORAL | Status: DC
  Administered 2013-10-01 – 2013-10-03 (×3): 81 mg via ORAL
  Filled 2013-10-01 (×3): qty 1

## 2013-10-01 NOTE — Progress Notes (Addendum)
PROGRESS NOTE  [name] FUX:[id number] DOB: 1934/03/09 DOA: 09/30/2013 PCP: Dorrene German, MD  HPI: [name] is a 80 y.o. male with past medical history significant for hypertension, chronic kidney disease stage III, diabetes mellitus type 2 (on insulin therapy), hyperlipidemia and history of CVA (with mild residual numbness/weakness on right lower extremity); came to the hospital complaining of dizziness and near syncope.  Assessment/Plan: Orthostatic dizziness and near syncope:- improving with hydration, will d/c diuretic on discharge  - obtain carotid doppler, MRI given history and CT findings a week ago.  - cortisol, TSH within normal limits.   Addendum: CVA - MRI obtained today showed several scattered punctate acute infarctions within the left middle cerebral artery distribution in the left hemisphere. Carotid doppler done last year showed greater than 70% stenosis on right; patient thinks he was told at one point to see someone but it appears that he never saw vascular surgery. Repeat carotid here with again with likely > 70% stenosis on right per preliminary read.  - neurology and vascular consulted, appreciate input.  - order a 2D echo - restart Aspirin, he was on it up until last week when he had a fall and right forearm superficial bleeding.   Diabetes mellitus without complication: A1C 6.0  Hypertension: Currently stable.   CKD (chronic kidney disease) stage 3, GFR 30-59 ml/min: Currently at baseline.   Hyperlipidemia: Will check lipid profile. Continue Zocor   Left foot diabetic ulcer: Without signs of acute infection. Patient actively following as an outpatient with Dr. Lajoyce Corners.   Constipation: Most likely secondary to the use of chronic narcotics.  -Will start MiraLAX   Diet: heart healthy/carb modified Fluids: NS 75 cc/h DVT Prophylaxis: heparin  Code Status: Full Family Communication: d/w patient  Disposition Plan: home when ready    Consultants:  None   Procedures:  None    Antibiotics  HPI/Subjective: No complaints, feels much better this morning.   Objective: Filed Vitals:   09/30/13 1538 09/30/13 1917 09/30/13 2253 10/01/13 0504  BP: 151/64 152/58 166/75 132/54  Pulse:  72 83   Temp: 98.3 F (36.8 C) 98.3 F (36.8 C) 97.7 F (36.5 C) 98 F (36.7 C)  TempSrc: Oral Oral Oral Oral  Resp: 20 10 16 16   Height:   5\' 7"  (1.702 m)   Weight:   78.9 kg (173 lb 15.1 oz)   SpO2: 100% 100% 100% 100%    Intake/Output Summary (Last 24 hours) at 10/01/13 1124 Last data filed at 10/01/13 1035  Gross per 24 hour  Intake 921.25 ml  Output   1250 ml  Net -328.75 ml   Filed Weights   09/30/13 2253  Weight: 78.9 kg (173 lb 15.1 oz)    Exam:   General:  NAD  Cardiovascular: regular rate and rhythm, without MRG  Respiratory: good air movement, clear to auscultation throughout, no wheezing, ronchi or rales  Abdomen: soft, not tender to palpation, positive bowel sounds  MSK: no peripheral edema  Neuro: CN 2-12 grossly intact, MS 5/5 in all 4  Data Reviewed: Basic Metabolic Panel:  Recent Labs Lab 09/30/13 1641 09/30/13 2340  NA 133*  --   K 4.7  --   CL 98  --   CO2 23  --   GLUCOSE 122*  --   BUN 59*  --   CREATININE 2.01* 1.82*  CALCIUM 8.9  --   MG  --  2.1  PHOS  --  2.7   Liver Function Tests:  No results found for this basename: AST, ALT, ALKPHOS, BILITOT, PROT, ALBUMIN,  in the last 168 hours No results found for this basename: LIPASE, AMYLASE,  in the last 168 hours No results found for this basename: AMMONIA,  in the last 168 hours CBC:  Recent Labs Lab 09/30/13 1641 09/30/13 2340  WBC 6.0 5.8  NEUTROABS 4.7  --   HGB 9.6* 10.1*  HCT 28.8* 30.4*  MCV 81.4 81.5  PLT 138* 151   Cardiac Enzymes: No results found for this basename: CKTOTAL, CKMB, CKMBINDEX, TROPONINI,  in the last 168 hours BNP (last 3 results) No results found for this basename: PROBNP,  in the  last 8760 hours CBG:  Recent Labs Lab 10/01/13 0747  GLUCAP 135*    No results found for this or any previous visit (from the past 240 hour(s)).   Studies: No results found.  Scheduled Meds: . heparin  5,000 Units Subcutaneous 3 times per day  . insulin aspart  0-9 Units Subcutaneous TID WC  . simvastatin  10 mg Oral Q lunch   Continuous Infusions: . sodium chloride 75 mL/hr at 09/30/13 2331    Principal Problem:   Orthostatic dizziness Active Problems:   Diabetes mellitus without complication   Hypertension   At high risk for falls   CKD (chronic kidney disease) stage 3, GFR 30-59 ml/min   Anemia of chronic disease   Time spent: 35  This note has been created with Education officer, environmentalDragon speech recognition software and smart phrase technology. Any transcriptional errors are unintentional.   Pamella Pertostin Gherghe, MD [facility] Pager [phone]. If 7 PM - 7 AM, please contact night-coverage at [email or web address], password [facility] 10/01/2013, 11:24 AM  LOS: 1 day

## 2013-10-01 NOTE — Progress Notes (Addendum)
Bilateral carotid artery duplex completed.  Bilateral:  40-59% ICA stenosis.  Vertebral artery flow is antegrade.  Duplex imaging of the brachial artery demonstrates triphasic waveforms.  Right:  40-59% ICA stenosis by end diastolic velocities and ICA/CCA ratio of 2.33.  60-79% stenosis by peak systolic velocities.  Left:  1-39% stenosis by end diastolic velocities, 40-59% stenosis by peak systolic velocities, and 60-79% stenosis by ICA/CCA ratio of 2.52.
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
## 2013-10-01 NOTE — Evaluation (Signed)
Physical Therapy Evaluation Patient Details Name: [name] MRN: [id number] DOB: 23-Jul-1933 Today's Date: 10/01/2013   History of Present Illness  [name] is a 80 y.o. male with past medical history significant for hypertension, chronic kidney disease stage III, diabetes mellitus type 2 (on insulin therapy), hyperlipidemia and history of CVA (with mild residual numbness/weakness on right lower extremity); came to the hospital complaining of dizziness and near syncope. He reports having L foot surgery 2 weeks ago. He stated he is WBAT.  Clinical Impression  *Pt admitted with orthostatic hypotension**. Pt currently with functional limitations due to the deficits listed below (see PT Problem List).  Pt will benefit from skilled PT to increase their independence and safety with mobility to allow discharge to the venue listed below.   Pt walked 100' with RW and supervision today. At baseline he does not use an assistive device, however he has mild balance deficit and would benefit from the support of a RW for ambulation. HHPT also recommended.  He denied dizziness with position changes. BP supine 146/50, sitting 132/51, standing 108/40 **    Follow Up Recommendations Home health PT    Equipment Recommendations  Rolling walker with 5" wheels    Recommendations for Other Services       Precautions / Restrictions Precautions Precaution Comments: monitor for dizziness 2* orthostatic hypotension Restrictions Weight Bearing Restrictions: No      Mobility  Bed Mobility Overal bed mobility: Modified Independent             General bed mobility comments: HOB elevated  Transfers Overall transfer level: Needs assistance Equipment used: Rolling walker (2 wheeled) Transfers: Sit to/from Stand Sit to Stand: Min guard         General transfer comment: min/guard for balance/safety  Ambulation/Gait Ambulation/Gait assistance: Supervision Ambulation Distance (Feet): 100  Feet Assistive device: Rolling walker (2 wheeled) Gait Pattern/deviations: WFL(Within Functional Limits)   Gait velocity interpretation: at or above normal speed for age/gender General Gait Details: supervision for safety, pt denied dizziness with walking, no LOB  Stairs            Wheelchair Mobility    Modified Rankin (Stroke Patients Only)       Balance Overall balance assessment: Needs assistance   Sitting balance-Leahy Scale: Good       Standing balance-Leahy Scale: Fair                               Pertinent Vitals/Pain *BP supine 146/50; sitting 132/51; standing 108/40 See doc flowsheets for HR Pt denied dizziness with position changes 0/10 pain **    Home Living Family/patient expects to be discharged to:: Private residence Living Arrangements: Spouse/significant other Available Help at Discharge: Available 24 hours/day Type of Home: House Home Access: Stairs to enter Entrance Stairs-Rails: Right Entrance Stairs-Number of Steps: 4 Home Layout: One level Home Equipment: None Additional Comments: would like a cane or RW    Prior Function Level of Independence: Independent               Hand Dominance        Extremity/Trunk Assessment   Upper Extremity Assessment: Overall WFL for tasks assessed           Lower Extremity Assessment: Overall WFL for tasks assessed      Cervical / Trunk Assessment: Normal  Communication   Communication: HOH  Cognition Arousal/Alertness: Awake/alert Behavior During Therapy: WFL for tasks assessed/performed  Overall Cognitive Status: Within Functional Limits for tasks assessed                      General Comments      Exercises        Assessment/Plan    PT Assessment Patient needs continued PT services  PT Diagnosis Generalized weakness   PT Problem List Decreased mobility;Decreased balance;Decreased activity tolerance  PT Treatment Interventions DME  instruction;Gait training;Functional mobility training;Stair training;Therapeutic activities;Patient/family education;Therapeutic exercise   PT Goals (Current goals can be found in the Care Plan section) Acute Rehab PT Goals Patient Stated Goal: to go home PT Goal Formulation: With patient Time For Goal Achievement: 10/15/13 Potential to Achieve Goals: Good    Frequency Min 3X/week   Barriers to discharge        Co-evaluation               End of Session Equipment Utilized During Treatment: Gait belt Activity Tolerance: Patient tolerated treatment well Patient left: in chair;with call bell/phone within reach Nurse Communication: Mobility status    Functional Assessment Tool Used: clinical judgement Functional Limitation: Mobility: Walking and moving around Mobility: Walking and Moving Around Current Status [phone]([id number]): At least 1 percent but less than 20 percent impaired, limited or restricted Mobility: Walking and Moving Around Goal Status [phone](G8979): 0 percent impaired, limited or restricted    Time: 1016-1035 PT Time Calculation (min): 19 min   Charges:   PT Evaluation $Initial PT Evaluation Tier I: 1 Procedure PT Treatments $Gait Training: 8-22 mins   PT G Codes:   Functional Assessment Tool Used: clinical judgement Functional Limitation: Mobility: Walking and moving around    [place], Pilgrim's PrideJennifer Kistler 10/01/2013, 10:49 AM [phone]
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
## 2013-10-02 ENCOUNTER — Encounter (HOSPITAL_COMMUNITY): Payer: Self-pay | Admitting: Neurology

## 2013-10-02 ENCOUNTER — Inpatient Hospital Stay (HOSPITAL_COMMUNITY): Payer: Medicare Other

## 2013-10-02 DIAGNOSIS — I319 Disease of pericardium, unspecified: Principal | ICD-10-CM

## 2013-10-02 DIAGNOSIS — R55 Syncope and collapse: Secondary | ICD-10-CM | POA: Diagnosis not present

## 2013-10-02 DIAGNOSIS — Z889 Allergy status to unspecified drugs, medicaments and biological substances status: Secondary | ICD-10-CM

## 2013-10-02 DIAGNOSIS — I739 Peripheral vascular disease, unspecified: Secondary | ICD-10-CM | POA: Diagnosis present

## 2013-10-02 DIAGNOSIS — I6350 Cerebral infarction due to unspecified occlusion or stenosis of unspecified cerebral artery: Secondary | ICD-10-CM

## 2013-10-02 DIAGNOSIS — R931 Abnormal findings on diagnostic imaging of heart and coronary circulation: Secondary | ICD-10-CM

## 2013-10-02 DIAGNOSIS — I313 Pericardial effusion (noninflammatory): Secondary | ICD-10-CM

## 2013-10-02 DIAGNOSIS — I3139 Other pericardial effusion (noninflammatory): Secondary | ICD-10-CM

## 2013-10-02 LAB — GLUCOSE, CAPILLARY
GLUCOSE-CAPILLARY: 119 mg/dL — AB (ref 70–99)
GLUCOSE-CAPILLARY: 119 mg/dL — AB (ref 70–99)
Glucose-Capillary: 127 mg/dL — ABNORMAL HIGH (ref 70–99)
Glucose-Capillary: 98 mg/dL (ref 70–99)

## 2013-10-02 LAB — SEDIMENTATION RATE: Sed Rate: 52 mm/hr — ABNORMAL HIGH (ref 0–16)

## 2013-10-02 LAB — C-REACTIVE PROTEIN: CRP: 7.7 mg/dL — ABNORMAL HIGH (ref <0.60)
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
## 2013-10-02 NOTE — Progress Notes (Signed)
Echo Lab  2D Echocardiogram completed.  Afifa Truax L Tuesday Terlecki, RDCS 10/02/2013 2:31 PM

## 2013-10-02 NOTE — Progress Notes (Signed)
PROGRESS NOTE  Siddhartha Dorsey EFE:[id number] DOB: 1933/11/03 DOA: 09/30/2013 PCP: Dorrene German, MD  HPI: Kristina Schriver is a 80 y.o. male with past medical history significant for hypertension, chronic kidney disease stage III, diabetes mellitus type 2 (on insulin therapy), hyperlipidemia and history of CVA (with mild residual numbness/weakness on right lower extremity); came to the hospital complaining of dizziness and near syncope.  Assessment/Plan: Orthostatic dizziness and near syncope:- improved with IVF, now asymptomatic Large pericardial effusion with moderate tamponade physiology - patient seems to be asymptomatic now, but probably contributing to #1. Cardiology consulted today, appreciate input.  - ?etiology CVA - MRI positive for CVA.  Possible carotid stenosis - appreciate vascular surgery input.  Diabetes mellitus without complication: A1C 6.0 Hypertension: Currently stable.  CKD (chronic kidney disease) stage 3, GFR 30-59 ml/min: Currently at baseline.  Hyperlipidemia: Continue Zocor  Left foot diabetic ulcer: Without signs of acute infection. Patient actively following as an outpatient with Dr. Lajoyce Corners.  Constipation: Most likely secondary to the use of chronic narcotics.  -Will start MiraLAX   Diet: heart healthy/carb modified Fluids: NS 75 cc/h DVT Prophylaxis: heparin  Code Status: Full Family Communication: d/w patient and son Disposition Plan: home when ready   Consultants:  None   Procedures:  None    Antibiotics  HPI/Subjective: No complaints, feels much better this morning, asking to go home  Objective: Filed Vitals:   10/01/13 1353 10/01/13 2147 10/02/13 0500 10/02/13 1353  BP: 138/52 102/80  147/54  Pulse: 64 67  66  Temp: 97.5 F (36.4 C) 97.6 F (36.4 C) 97.9 F (36.6 C) 97.9 F (36.6 C)  TempSrc: Oral Oral  Oral  Resp: 18 20  20   Height:      Weight:      SpO2: 100% 100% 100% 100%    Intake/Output Summary (Last 24 hours) at  10/02/13 1546 Last data filed at 10/02/13 1320  Gross per 24 hour  Intake   1605 ml  Output   1275 ml  Net    330 ml   Filed Weights   09/30/13 2253  Weight: 78.9 kg (173 lb 15.1 oz)    Exam:   General:  NAD  Cardiovascular: regular rate and rhythm, without MRG  Respiratory: good air movement, clear to auscultation throughout, no wheezing, ronchi or rales  Abdomen: soft, not tender to palpation, positive bowel sounds  MSK: no peripheral edema  Neuro: CN 2-12 grossly intact, MS 5/5 in all 4  Data Reviewed: Basic Metabolic Panel:  Recent Labs Lab 09/30/13 1641 09/30/13 2340  NA 133*  --   K 4.7  --   CL 98  --   CO2 23  --   GLUCOSE 122*  --   BUN 59*  --   CREATININE 2.01* 1.82*  CALCIUM 8.9  --   MG  --  2.1  PHOS  --  2.7   CBC:  Recent Labs Lab 09/30/13 1641 09/30/13 2340  WBC 6.0 5.8  NEUTROABS 4.7  --   HGB 9.6* 10.1*  HCT 28.8* 30.4*  MCV 81.4 81.5  PLT 138* 151   CBG:  Recent Labs Lab 10/01/13 1213 10/01/13 1708 10/01/13 2204 10/02/13 0739 10/02/13 1201  GLUCAP 110* 144* 114* 119* 119*   Studies: Mr Maxine Glenn Head Wo Contrast  10/02/2013   CLINICAL DATA:  80 year old male with small acute left MCA territory infarcts. Altered mental status. Initial encounter.  EXAM: MRA NECK WITHOUT CONTRAST  MRA HEAD WITHOUT CONTRAST  TECHNIQUE:  Multiplanar and multiecho pulse sequences of the neck were obtained without intravenous contrast. Angiographic images of the Circle of Willis were obtained using MRA technique without intravenous contrast.  COMPARISON:  Brain MRI 10/01/2013 MRA head and neck 07/02/2010.  FINDINGS: MRA NECK FINDINGS  Time-of-flight neck MRA. Antegrade flow signal in both vertebral and carotid arteries appears similar to that in 2012. The left vertebral artery is dominant. Motion artifact at the carotid bifurcations, but no loss of antegrade flow signal at the carotid bifurcations or in the cervical ICAs.  MRA HEAD FINDINGS  Stable antegrade  flow in the posterior circulation supplied by a dominant distal left vertebral artery. The non dominant right vertebral artery terminates in PICA. Left PICA origin remains patent. Basilar artery is stable without stenosis. SCA and left PCA origins remain normal. Fetal type right PCA origin is stable and within normal limits. Bilateral PCA branches are within normal limits.  Stable antegrade flow in both ICA siphons, with a dominant dolichoectatic right ICA siphon demonstrating tortuosity and mild irregularity just below the skullbase, stable. Non dominant left ICA siphon is patent with moderate to severe irregularity in the vertical petrous segment which has mildly progressed (series 303, image 9 today versus series 905, image 7 previously). Moderate stenosis occurs at that site. Otherwise no ICA siphon stenosis identified.  Ophthalmic and posterior communicating artery origins are within normal limits. Carotid termini are patent.  New stenosis and decreased flow in the left ACA A1 segment. Stenosis at its origin appears moderate to severe (series 303, image 12). Previously the A1 segments were codominant. Anterior communicating artery Stable and within normal limits. Other visualized ACA branches remain normal.  MCA origins are stable and within normal limits. Right MCA branches are stable and within normal limits.  Left MCA M1 segment is stable and within normal limits. Visualized left MCA branches are stable and within normal limits.  IMPRESSION: 1. Grossly stable non contrast neck MRA, without evidence of hemodynamically significant stenosis in the neck. 2. Atherosclerosis and moderate stenosis of the left ICA petrous segment have progressed since 2012. 3. New hemodynamically significant stenosis at the left ACA origin with decreased left A1 segment flow. Anterior communicating artery in remaining ACA branches remain normal. 4. Remaining anterior circulation, including the left MCA branches, are stable since  2012 and within normal limits. 5. Stable and negative posterior circulation.   Electronically Signed   By: Augusto GambleLee  Hall M.D.   On: 10/02/2013 08:54   Mr Angiogram Neck Wo Contrast  10/02/2013   CLINICAL DATA:  80 year old male with small acute left MCA territory infarcts. Altered mental status. Initial encounter.  EXAM: MRA NECK WITHOUT CONTRAST  MRA HEAD WITHOUT CONTRAST  TECHNIQUE: Multiplanar and multiecho pulse sequences of the neck were obtained without intravenous contrast. Angiographic images of the Circle of Willis were obtained using MRA technique without intravenous contrast.  COMPARISON:  Brain MRI 10/01/2013 MRA head and neck 07/02/2010.  FINDINGS: MRA NECK FINDINGS  Time-of-flight neck MRA. Antegrade flow signal in both vertebral and carotid arteries appears similar to that in 2012. The left vertebral artery is dominant. Motion artifact at the carotid bifurcations, but no loss of antegrade flow signal at the carotid bifurcations or in the cervical ICAs.  MRA HEAD FINDINGS  Stable antegrade flow in the posterior circulation supplied by a dominant distal left vertebral artery. The non dominant right vertebral artery terminates in PICA. Left PICA origin remains patent. Basilar artery is stable without stenosis. SCA and left PCA origins remain  normal. Fetal type right PCA origin is stable and within normal limits. Bilateral PCA branches are within normal limits.  Stable antegrade flow in both ICA siphons, with a dominant dolichoectatic right ICA siphon demonstrating tortuosity and mild irregularity just below the skullbase, stable. Non dominant left ICA siphon is patent with moderate to severe irregularity in the vertical petrous segment which has mildly progressed (series 303, image 9 today versus series 905, image 7 previously). Moderate stenosis occurs at that site. Otherwise no ICA siphon stenosis identified.  Ophthalmic and posterior communicating artery origins are within normal limits. Carotid termini  are patent.  New stenosis and decreased flow in the left ACA A1 segment. Stenosis at its origin appears moderate to severe (series 303, image 12). Previously the A1 segments were codominant. Anterior communicating artery Stable and within normal limits. Other visualized ACA branches remain normal.  MCA origins are stable and within normal limits. Right MCA branches are stable and within normal limits.  Left MCA M1 segment is stable and within normal limits. Visualized left MCA branches are stable and within normal limits.  IMPRESSION: 1. Grossly stable non contrast neck MRA, without evidence of hemodynamically significant stenosis in the neck. 2. Atherosclerosis and moderate stenosis of the left ICA petrous segment have progressed since 2012. 3. New hemodynamically significant stenosis at the left ACA origin with decreased left A1 segment flow. Anterior communicating artery in remaining ACA branches remain normal. 4. Remaining anterior circulation, including the left MCA branches, are stable since 2012 and within normal limits. 5. Stable and negative posterior circulation.   Electronically Signed   By: Augusto Gamble M.D.   On: 10/02/2013 08:54   Mr Brain Wo Contrast  10/01/2013   CLINICAL DATA:  Altered mental status.  Loss of consciousness.  EXAM: MRI HEAD WITHOUT CONTRAST  TECHNIQUE: Multiplanar, multiecho pulse sequences of the brain and surrounding structures were obtained without intravenous contrast.  COMPARISON:  Head CT 09/24/2013, MRI 07/02/2010  FINDINGS: Diffusion imaging shows multiple punctate foci of acute infarction in the left middle cerebral artery distribution of the left hemisphere consistent with multiple micro embolic infarctions. No branch vessel or large vessel territory infarction.  There chronic small vessel ischemic changes affecting the pons. There are multiple old small vessel cerebellar infarctions bilaterally. The cerebral hemispheres show a background pattern of confluent chronic small  vessel disease throughout the brain and multiple cortical and subcortical infarcts scattered about both hemispheres. No evidence of neoplastic mass lesion, hemorrhage, hydrocephalus or extra-axial collection. No pituitary mass. No inflammatory sinus disease. No skull or skullbase lesion.  IMPRESSION: Background pattern of chronic small vessel disease throughout the brain. Multiple cortical and subcortical infarctions throughout cerebellum and both cerebral hemispheres.  Several scattered punctate acute infarctions within the left middle cerebral artery distribution in the left hemisphere consistent with micro embolic infarctions.   Electronically Signed   By: Paulina Fusi M.D.   On: 10/01/2013 16:03    Scheduled Meds: . aspirin EC  81 mg Oral Daily  . heparin  5,000 Units Subcutaneous 3 times per day  . insulin aspart  0-9 Units Subcutaneous TID WC  . senna-docusate  2 tablet Oral QHS  . simvastatin  10 mg Oral Q lunch   Continuous Infusions: . sodium chloride 75 mL/hr at 10/02/13 0254    Principal Problem:   Orthostatic dizziness Active Problems:   Diabetes mellitus without complication   Hypertension   At high risk for falls   CKD (chronic kidney disease) stage 3, GFR  30-59 ml/min   Anemia of chronic disease   CVA (cerebral infarction)  Time spent: 35  This note has been created with Education officer, environmental. Any transcriptional errors are unintentional.   Pamella Pert, MD [facility] Pager [phone]. If 7 PM - 7 AM, please contact night-coverage at [email or web address], password [facility] 10/02/2013, 3:46 PM  LOS: 2 days
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
## 2013-10-02 NOTE — Consult Note (Signed)
MRA reviewed.  No significant cervical carotid stenosis suggestive that duplex is toward lower end of findings.  Has some carotid plaque bifurcation but no real benefit to CEA with this amount of stenosis.  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  He needs office visit and repeat carotid duplex in 6 months.  I will arrange this.  Fabienne Bruns, MD [facility] Office: [phone] Pager: [phone]

## 2013-10-03 DIAGNOSIS — R42 Dizziness and giddiness: Secondary | ICD-10-CM

## 2013-10-03 DIAGNOSIS — N183 Chronic kidney disease, stage 3 unspecified: Secondary | ICD-10-CM

## 2013-10-03 DIAGNOSIS — R55 Syncope and collapse: Secondary | ICD-10-CM | POA: Diagnosis not present

## 2013-10-03 DIAGNOSIS — I319 Disease of pericardium, unspecified: Secondary | ICD-10-CM

## 2013-10-03 LAB — BASIC METABOLIC PANEL
ANION GAP: 10 (ref 5–15)
BUN: 35 mg/dL — ABNORMAL HIGH (ref 6–23)
CO2: 23 mEq/L (ref 19–32)
Calcium: 8.7 mg/dL (ref 8.4–10.5)
Chloride: 107 mEq/L (ref 96–112)
Creatinine, Ser: 1.62 mg/dL — ABNORMAL HIGH (ref 0.50–1.35)
GFR calc Af Amer: 45 mL/min — ABNORMAL LOW (ref >90)
GFR calc non Af Amer: 39 mL/min — ABNORMAL LOW (ref >90)
Glucose, Bld: 104 mg/dL — ABNORMAL HIGH (ref 70–99)
POTASSIUM: 4.8 meq/L (ref 3.7–5.3)
Sodium: 140 mEq/L (ref 137–147)

## 2013-10-03 LAB — HIV ANTIBODY (ROUTINE TESTING W REFLEX): HIV: NONREACTIVE

## 2013-10-03 LAB — CBC
HCT: 26.6 % — ABNORMAL LOW (ref 39.0–52.0)
HEMOGLOBIN: 8.9 g/dL — AB (ref 13.0–17.0)
MCH: 27.6 pg (ref 26.0–34.0)
MCHC: 33.5 g/dL (ref 30.0–36.0)
MCV: 82.4 fL (ref 78.0–100.0)
Platelets: 132 10*3/uL — ABNORMAL LOW (ref 150–400)
RBC: 3.23 MIL/uL — ABNORMAL LOW (ref 4.22–5.81)
RDW: 14.8 % (ref 11.5–15.5)
WBC: 4.1 10*3/uL (ref 4.0–10.5)

## 2013-10-03 LAB — GLUCOSE, CAPILLARY
GLUCOSE-CAPILLARY: 91 mg/dL (ref 70–99)
Glucose-Capillary: 115 mg/dL — ABNORMAL HIGH (ref 70–99)

## 2013-10-03 LAB — RHEUMATOID FACTOR: Rhuematoid fact SerPl-aCnc: <10 IU/mL (ref <=14)

## 2013-10-03 LAB — VITAMIN D 1,25 DIHYDROXY
Vitamin D 1, 25 (OH)2 Total: 27 pg/mL (ref 18–72)
Vitamin D3 1, 25 (OH)2: 27 pg/mL

## 2013-10-03 MED ORDER — ASPIRIN 81 MG PO TBEC: 81.0000 mg | DELAYED_RELEASE_TABLET | Freq: Every day | ORAL | Status: DC

## 2013-10-03 MED ORDER — FERROUS SULFATE 325 (65 FE) MG PO TABS
325.0000 mg | ORAL_TABLET | Freq: Three times a day (TID) | ORAL | Status: DC
  Administered 2013-10-03: 325 mg via ORAL
  Filled 2013-10-03 (×3): qty 1

## 2013-10-03 MED ORDER — COLCHICINE 0.6 MG PO TABS: 0.6000 mg | ORAL_TABLET | Freq: Every day | ORAL | Status: DC

## 2013-10-03 MED ORDER — VALSARTAN 80 MG PO TABS: 80.0000 mg | ORAL_TABLET | Freq: Every day | ORAL | Status: DC

## 2013-10-03 MED ORDER — COLCHICINE 0.6 MG PO TABS
0.6000 mg | ORAL_TABLET | Freq: Two times a day (BID) | ORAL | Status: DC
  Administered 2013-10-03: 0.6 mg via ORAL
  Filled 2013-10-03 (×2): qty 1

## 2013-10-03 MED ORDER — FERROUS SULFATE 325 (65 FE) MG PO TABS: 325.0000 mg | ORAL_TABLET | Freq: Three times a day (TID) | ORAL | Status: DC
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
## 2013-10-03 NOTE — Progress Notes (Signed)
Pt states that he was with [facility] and who like to continue with them.  Referral called to in house rep.

## 2013-10-03 NOTE — Progress Notes (Signed)
Physical Therapy Treatment Patient Details Name: Aashir Portwood MRN: [id number] DOB: 02-04-1934 Today's Date: 10/03/2013    History of Present Illness Kahleel Fertitta is a 80 y.o. male with past medical history significant for hypertension, chronic kidney disease stage III, diabetes mellitus type 2 (on insulin therapy), hyperlipidemia and history of CVA (with mild residual numbness/weakness on right lower extremity); came to the hospital complaining of dizziness and near syncope. He reports having L foot surgery 2 weeks ago.     PT Comments    Assisted pt OOB to amb in hallway using RW which pt preferred over SW that was in his room.  Pt tolerated session well with NO c/o's. Assisted to BR to void and wash hands all at Supervision level.  Pt performed turns and side steps with NO LOB. Assisted back to bed per pt request.    Follow Up Recommendations  Home health PT     Equipment Recommendations  Rolling walker with 5" wheels    Recommendations for Other Services       Precautions / Restrictions Precautions Precautions: None Restrictions Weight Bearing Restrictions: Yes    Mobility  Bed Mobility Overal bed mobility: Modified Independent             General bed mobility comments: HOB elevated and increased time   Transfers Overall transfer level: Needs assistance Equipment used: Rolling walker (2 wheeled) Transfers: Sit to/from Stand Sit to Stand: Supervision         General transfer comment: <25% VC's safety with turns  Ambulation/Gait Ambulation/Gait assistance: Supervision Ambulation Distance (Feet): 500 Feet (250 feet x 2) Assistive device: Rolling walker (2 wheeled) Gait Pattern/deviations: Step-through pattern Gait velocity: WFL   General Gait Details: Pt prefers using RW vs SW that was in his room.  No c/o chest pains.  No dizziness.  Avg HR 70.   Stairs            Wheelchair Mobility    Modified Rankin (Stroke Patients Only)        Balance                                    Cognition                            Exercises      General Comments        Pertinent Vitals/Pain HR 70 No c/o's    Home Living                      Prior Function            PT Goals (current goals can now be found in the care plan section) Progress towards PT goals: Progressing toward goals    Frequency  Min 3X/week    PT Plan      Co-evaluation             End of Session Equipment Utilized During Treatment: Gait belt Activity Tolerance: Patient tolerated treatment well Patient left: in bed;with call bell/phone within reach     Time: 1050-1118 PT Time Calculation (min): 28 min  Charges:  $Gait Training: 8-22 mins $Therapeutic Activity: 8-22 mins                    G Codes:      Kadeja Granada  PTA [employer]  Acute  Rehab Pager      [phone]

## 2013-10-03 NOTE — Discharge Instructions (Signed)
You were cared for by White hospitalist during your hospital stay. If you have any questions about your discharge medications or the care you received while you were in the hospital after you are discharged, you can call the unit and asked to speak with the hospitalist on call if the hospitalist that took care of you is not available. Once you are discharged, your primary care physician will handle any further medical issues. Please note that NO REFILLS for any discharge medications will be authorized once you are discharged, as it is imperative that you return to your primary care physician (or establish White relationship with White primary care physician if you do not have one) for your aftercare needs so that they can reassess your need for medications and monitor your lab values.     If you do not have White primary care physician, you can call [phone] for White physician referral.  Follow with Primary [name] Barry Contras White, [name] in 5-7 days   Get CBC, CMP checked by your doctor and again as further instructed.  Get White 2 view Chest X ray done next visit if you had Pneumonia of Lung problems at the Hospital.  Get Medicines reviewed and adjusted.  Please request your Prim.[name] to go over all Hospital Tests and Procedure/Radiological results at the follow up, please get all Hospital records sent to your Prim [name] by signing hospital release before you go home.  Activity: As tolerated with Full fall precautions use walker/cane & assistance as needed  Diet: heart healthy, diabetic  For Heart failure patients - Check your Weight same time everyday, if you gain over 2 pounds, or you develop in leg swelling, experience more shortness of breath or chest pain, call your Primary [name] immediately. Follow Cardiac Low Salt Diet and 1.8 lit/day fluid restriction.  Disposition Home  If you experience worsening of your admission symptoms, develop shortness of breath, life threatening emergency, suicidal or homicidal thoughts you must  seek medical attention immediately by calling 911 or calling your [name] immediately  if symptoms less severe.  You Must read complete instructions/literature along with all the possible adverse reactions/side effects for all the Medicines you take and that have been prescribed to you. Take any new Medicines after you have completely understood and accpet all the possible adverse reactions/side effects.   Do not drive and provide baby sitting services if your were admitted for syncope or siezures until you have seen by Primary [name] or White Neurologist and advised to do so again.  Do not drive when taking Pain medications.   Do not take more than prescribed Pain, Sleep and Anxiety Medications  Special Instructions: If you have smoked or chewed Tobacco  in the last 2 yrs please stop smoking, stop any regular Alcohol  and or any Recreational drug use.  Wear Seat belts while driving.

## 2013-10-04 LAB — QUANTIFERON TB GOLD ASSAY (BLOOD)
Interferon Gamma Release Assay: NEGATIVE
Mitogen value: 2.35 IU/mL
Quantiferon Nil Value: 0.01 IU/mL
TB AG VALUE: 0.01 [IU]/mL
TB ANTIGEN MINUS NIL VALUE: 0 [IU]/mL

## 2013-10-04 NOTE — Discharge Summary (Signed)
Physician Discharge Summary  [name] DXI:[id number] DOB: [birth year] DOA: 09/30/2013  PCP: Philis Fendt, MD  Admit date: 09/30/2013 Discharge date: 10/04/2013  Time spent: 35 minutes  Recommendations for Outpatient Follow-up:  1. Follow up with Dr. Jeanie Cooks in 1 week 2. Follow up with Dr. Ron Parker in 2 weeks 3. Follow up with Dr. Oneida Alar in 6 months 4. Follow up with Dr. Leonie Man in 2 months.    Recommendations for primary care physician for things to follow:  Follow up on autoimmune labs Repeat CBC, BMP  Discharge Diagnoses:  Principal Problem:   Orthostatic dizziness Active Problems:   Diabetes mellitus without complication   Hypertension   At high risk for falls   CKD (chronic kidney disease) stage 3, GFR 30-59 ml/min   Anemia of chronic disease   CVA (cerebral infarction)   PAD (peripheral artery disease)   Pericardial effusion   Decreased cardiac ejection fraction   Drug allergy  Discharge Condition: stable  Diet recommendation: hear healthy  Filed Weights   09/30/13 2253  Weight: 78.9 kg (173 lb 15.1 oz)   History of present illness:  [name] is a 80 y.o. male with past medical history significant for hypertension, chronic kidney disease stage III, diabetes mellitus type 2 (on insulin therapy), hyperlipidemia and history of CVA (with mild residual numbness/weakness on right lower extremity); came to the hospital complaining of dizziness and near syncope. Patient reports that he symptoms has been present for the last 2 weeks intermittently and that nothing appears to help him. Patient was seen in the ED approximately 8-9 days ago and at that time he was diagnosed with orthostatic hypotension, received fluid resuscitation and was discharged home with instructions to follow with PCP. Patient reports that he has not been able to see his PCP due to lack of appointment availability; he endorses eating and drinking as instructed at his last visit and despite this  continued to have symptoms. Patient denies shortness of breath, chills, fever, dysuria, nausea, vomiting, chest pain, palpitations, or any other acute complaints. In the ED patient was found to be orthostatic and despite administration of NS (1 L of fluid) given, he continued to be symptomatic. [facility] has been called to admit the patient for further evaluation and treatment. Of note, CT scan of the head was done and demonstrated no acute intracranial abnormalities.   Hospital Course:  Orthostatic dizziness and near syncope - patient was felt to be mildly dehydrated on admission> he was started on gentle IVF and his symptoms have completely resolved by hospital day 2.  CVA - due to CT scan findings a week prior to admission when he had a fall, MRI was ordered which showed several punctate acute infarctions within the left MCA distribution. Neurology has been consulted, and carotid ultrasound as well as 2-D echo have been ordered. Patient came off of aspirin following his fall a week prior to presentation, and his aspirin was restarted per neurology recommendations. Carotid stenosis - vascular surgery has been consulted given previous carotid ultrasound showing stenosis greater than 70%, they recommended obtaining an MRA, and per Dr. Oneida Alar evaluation there is no real benefit to CEA at this point and he will arrange a followup visit as an outpatient in about 6 months for repeat imaging . Large pericardial effusion with moderate tamponade physiology - 2-D echo obtained for evaluation for CVA incidentally found a large pericardial effusion with moderate tamponade physiology, however clinically he is without evidence of pericardial tamponade. Cardiology has been  consulted, and patient has been started on colchicine and Dr. Ron Parker will follow with him in clinic in about couple weeks. It is not clear at this point the etiology for his precordial effusion, patient underwent a CT scan of the chest to evaluate  for underlying malignancy and that was not evident for cancer (full review below). He does have elevated inflammatory markers (ESR and CRP) and several labs to investigate autoimmune conditions have been sent. Results are pending at the time of discharge, and I will call his primary care provider to ask for followup. Clinically patient has no symptoms related to his pericardial effusion, is able to walk in the halls without any chest pain, dyspnea, palpitations. He is eager to go home. CKD (chronic kidney disease) stage 3, GFR 30-59 ml/min: Currently at baseline, recommend outpatient followup for repeat BMP. Hyperlipidemia: Continue Zocor  Left foot diabetic ulcer: Without signs of acute infection. Patient actively following as an outpatient with Dr. Sharol Given.  Constipation: Most likely secondary to the use of chronic narcotics.  Probable underlying dementia - patient with intermittent cognitive problems, progressive in the past few weeks, I suspect that he is developing a degree of vascular dementia. Of note, most recent CT head noted extensive bilateral supra-and infratentorial infarct with severe white matter changes suggesting chronic small vessel ischemic disease. This can be followup and further evaluated as an outpatient.  Procedures:  2D echo   Consultations:  Neurology  Vascular Surgery   Cardiology   Discharge Exam: Filed Vitals:   10/02/13 1353 10/02/13 2205 10/03/13 0526 10/03/13 1410  BP: 147/54 157/58 146/61 175/66  Pulse: 66 65 63 70  Temp: 97.9 F (36.6 C) 97.6 F (36.4 C) 97.6 F (36.4 C) 98 F (36.7 C)  TempSrc: Oral Oral Oral Oral  Resp: 20 18 18 18   Height:      Weight:      SpO2: 100% 100% 100% 100%    General: NAD Cardiovascular: RRR Respiratory: CTA biL  Discharge Instructions     Medication List    STOP taking these medications       valsartan-hydrochlorothiazide 160-12.5 MG per tablet  Commonly known as:  DIOVAN-HCT      TAKE these medications         aspirin 81 MG EC tablet  Take 1 tablet (81 mg total) by mouth daily.     colchicine 0.6 MG tablet  Take 1 tablet (0.6 mg total) by mouth daily.     ferrous sulfate 325 (65 FE) MG tablet  Take 1 tablet (325 mg total) by mouth 3 (three) times daily with meals.     glucose 4 GM chewable tablet  Chew 1 tablet by mouth as needed for low blood sugar.     HYDROcodone-acetaminophen 5-325 MG per tablet  Commonly known as:  NORCO/VICODIN  Take 1 tablet by mouth every 4 (four) hours as needed for moderate pain (foot pain).     insulin aspart 100 UNIT/ML injection  Commonly known as:  novoLOG  Inject 10 Units into the skin 2 (two) times daily.     simvastatin 10 MG tablet  Commonly known as:  ZOCOR  Take 10 mg by mouth daily with lunch.     valsartan 80 MG tablet  Commonly known as:  DIOVAN  Take 1 tablet (80 mg total) by mouth daily.           Follow-up Information   Follow up with Nolene Ebbs A, MD. Schedule an appointment as soon as possible  for a visit in 1 week.   Specialty:  Internal Medicine   Contact information:   [address] [place] [address] [phone]       Follow up with Elam Dutch, MD. Schedule an appointment as soon as possible for a visit in 6 months.   Specialty:  Vascular Surgery   Contact information:   [address] [place] [address] [phone]       Follow up with Forbes Cellar, MD. Schedule an appointment as soon as possible for a visit in 2 months.   Specialties:  Neurology, Radiology   Contact information:   [address] [place]  [address] [phone]       Follow up with Dola Argyle, MD. Schedule an appointment as soon as possible for a visit in 2 weeks.   Specialty:  Cardiology   Contact information:   [address] [place] [address] [phone]      The results of significant diagnostics from this hospitalization (including imaging, microbiology, ancillary and  laboratory) are listed below for reference.    Significant Diagnostic Studies: Dg Chest 2 View  10/02/2013   CLINICAL DATA:  Shortness of breath  EXAM: CHEST  2 VIEW  COMPARISON:  07/25/2012  FINDINGS: Trace bilateral pleural effusions. No focal consolidation or pneumothorax. Enlarged cardiac silhouette which may reflect cardiomegaly versus pericardial effusion.  The osseous structures are unremarkable.  IMPRESSION: Enlarged cardiac silhouette which may reflect cardiomegaly versus pericardial effusion.  Trace bilateral pleural effusions.   Electronically Signed   By: Kathreen Devoid   On: 10/02/2013 17:53   Ct Head Wo Contrast  09/24/2013   CLINICAL DATA:  Altered mental status, loss of consciousness.  EXAM: CT HEAD WITHOUT CONTRAST  TECHNIQUE: Contiguous axial images were obtained from the base of the skull through the vertex without intravenous contrast.  COMPARISON:  CT of the head July 25, 2012  FINDINGS: The ventricles and sulci are normal for age. No intraparenchymal hemorrhage, mass effect nor midline shift. Confluent supratentorial white matter hypodensities are non-specific suggest sequelae of chronic small vessel ischemic disease. No acute large vascular territory infarcts. Similar left temporal parietal, right temporal and bifrontal cortical to subcortical encephalomalacia with dystrophic calcification and left temporal parietal lobe. Remote bilateral cerebellar infarcts.  No abnormal extra-axial fluid collections. Basal cisterns are patent. Moderate to severe calcific atherosclerosis of the carotid siphons.  No skull fracture. The included ocular globes and orbital contents are non-suspicious. Status post bilateral ocular lens implants. The mastoid aircells and included paranasal sinuses are well-aerated.  IMPRESSION: No acute intracranial process.  Similar extensive bilateral supra and infratentorial infarcts. Severe white matter changes suggest chronic small vessel ischemic disease.    Electronically Signed   By: Elon Alas   On: 09/24/2013 00:23   Ct Chest Wo Contrast  10/02/2013   CLINICAL DATA:  Weakness, shortness of breath, evaluate for malignancy  EXAM: CT CHEST WITHOUT CONTRAST  TECHNIQUE: Multidetector CT imaging of the chest was performed following the standard protocol without IV contrast.  COMPARISON:  Chest radiographs dated 10/02/2013  FINDINGS: Two ground-glass nodular opacities in the right middle lobe measuring 4-6 mm (series 5/images 35-36), possibly reflecting infectious bronchiolitis. 3 mm subpleural nodule in the right upper lobe (series 5/image 19), nonspecific but likely benign.  No focal consolidation. Small left and trace right pleural effusions. No pneumothorax.  Visualized thyroid is unremarkable.  Cardiomegaly. Moderate pericardial effusion. Coronary atherosclerosis. Atherosclerotic calcifications of the aortic arch.  No suspicious  mediastinal or axillary lymphadenopathy.  Visualized upper abdomen is grossly unremarkable.  Degenerative changes of the visualized thoracolumbar spine.  IMPRESSION: Two ground-glass nodular opacities in the right middle lobe measuring 4-6 mm, likely infectious/inflammatory, possibly reflecting infectious bronchiolitis.  3 mm subpleural nodule in the right upper lobe, nonspecific but likely benign. If this patient is high risk for primary bronchogenic neoplasm, a single follow-up CT chest is suggested in 12 months. If low risk, no dedicated follow-up imaging is required per Fleischner Society guidelines.  Moderate pericardial effusion. Correlate for tamponade physiology as clinically warranted.  This recommendation follows the consensus statement: Guidelines for Management of Small Pulmonary Nodules Detected on CT Scans: A Statement from the [place] as published in Radiology 2005; 237:395-400.   Electronically Signed   By: Julian Hy M.D.   On: 10/02/2013 17:59   Mr Virgel Paling Wo Contrast  10/02/2013   CLINICAL DATA:   80 year old male with small acute left MCA territory infarcts. Altered mental status. Initial encounter.  EXAM: MRA NECK WITHOUT CONTRAST  MRA HEAD WITHOUT CONTRAST  TECHNIQUE: Multiplanar and multiecho pulse sequences of the neck were obtained without intravenous contrast. Angiographic images of the Circle of Willis were obtained using MRA technique without intravenous contrast.  COMPARISON:  Brain MRI 10/01/2013 MRA head and neck 07/02/2010.  FINDINGS: MRA NECK FINDINGS  Time-of-flight neck MRA. Antegrade flow signal in both vertebral and carotid arteries appears similar to that in 2012. The left vertebral artery is dominant. Motion artifact at the carotid bifurcations, but no loss of antegrade flow signal at the carotid bifurcations or in the cervical ICAs.  MRA HEAD FINDINGS  Stable antegrade flow in the posterior circulation supplied by a dominant distal left vertebral artery. The non dominant right vertebral artery terminates in PICA. Left PICA origin remains patent. Basilar artery is stable without stenosis. SCA and left PCA origins remain normal. Fetal type right PCA origin is stable and within normal limits. Bilateral PCA branches are within normal limits.  Stable antegrade flow in both ICA siphons, with a dominant dolichoectatic right ICA siphon demonstrating tortuosity and mild irregularity just below the skullbase, stable. Non dominant left ICA siphon is patent with moderate to severe irregularity in the vertical petrous segment which has mildly progressed (series 303, image 9 today versus series 905, image 7 previously). Moderate stenosis occurs at that site. Otherwise no ICA siphon stenosis identified.  Ophthalmic and posterior communicating artery origins are within normal limits. Carotid termini are patent.  New stenosis and decreased flow in the left ACA A1 segment. Stenosis at its origin appears moderate to severe (series 303, image 12). Previously the A1 segments were codominant. Anterior  communicating artery Stable and within normal limits. Other visualized ACA branches remain normal.  MCA origins are stable and within normal limits. Right MCA branches are stable and within normal limits.  Left MCA M1 segment is stable and within normal limits. Visualized left MCA branches are stable and within normal limits.  IMPRESSION: 1. Grossly stable non contrast neck MRA, without evidence of hemodynamically significant stenosis in the neck. 2. Atherosclerosis and moderate stenosis of the left ICA petrous segment have progressed since 2012. 3. New hemodynamically significant stenosis at the left ACA origin with decreased left A1 segment flow. Anterior communicating artery in remaining ACA branches remain normal. 4. Remaining anterior circulation, including the left MCA branches, are stable since 2012 and within normal limits. 5. Stable and negative posterior circulation.   Electronically Signed   By: Lars Pinks  M.D.   On: 10/02/2013 08:54   Mr Angiogram Neck Wo Contrast  10/02/2013   CLINICAL DATA:  80 year old male with small acute left MCA territory infarcts. Altered mental status. Initial encounter.  EXAM: MRA NECK WITHOUT CONTRAST  MRA HEAD WITHOUT CONTRAST  TECHNIQUE: Multiplanar and multiecho pulse sequences of the neck were obtained without intravenous contrast. Angiographic images of the Circle of Willis were obtained using MRA technique without intravenous contrast.  COMPARISON:  Brain MRI 10/01/2013 MRA head and neck 07/02/2010.  FINDINGS: MRA NECK FINDINGS  Time-of-flight neck MRA. Antegrade flow signal in both vertebral and carotid arteries appears similar to that in 2012. The left vertebral artery is dominant. Motion artifact at the carotid bifurcations, but no loss of antegrade flow signal at the carotid bifurcations or in the cervical ICAs.  MRA HEAD FINDINGS  Stable antegrade flow in the posterior circulation supplied by a dominant distal left vertebral artery. The non dominant right vertebral  artery terminates in PICA. Left PICA origin remains patent. Basilar artery is stable without stenosis. SCA and left PCA origins remain normal. Fetal type right PCA origin is stable and within normal limits. Bilateral PCA branches are within normal limits.  Stable antegrade flow in both ICA siphons, with a dominant dolichoectatic right ICA siphon demonstrating tortuosity and mild irregularity just below the skullbase, stable. Non dominant left ICA siphon is patent with moderate to severe irregularity in the vertical petrous segment which has mildly progressed (series 303, image 9 today versus series 905, image 7 previously). Moderate stenosis occurs at that site. Otherwise no ICA siphon stenosis identified.  Ophthalmic and posterior communicating artery origins are within normal limits. Carotid termini are patent.  New stenosis and decreased flow in the left ACA A1 segment. Stenosis at its origin appears moderate to severe (series 303, image 12). Previously the A1 segments were codominant. Anterior communicating artery Stable and within normal limits. Other visualized ACA branches remain normal.  MCA origins are stable and within normal limits. Right MCA branches are stable and within normal limits.  Left MCA M1 segment is stable and within normal limits. Visualized left MCA branches are stable and within normal limits.  IMPRESSION: 1. Grossly stable non contrast neck MRA, without evidence of hemodynamically significant stenosis in the neck. 2. Atherosclerosis and moderate stenosis of the left ICA petrous segment have progressed since 2012. 3. New hemodynamically significant stenosis at the left ACA origin with decreased left A1 segment flow. Anterior communicating artery in remaining ACA branches remain normal. 4. Remaining anterior circulation, including the left MCA branches, are stable since 2012 and within normal limits. 5. Stable and negative posterior circulation.   Electronically Signed   By: Lars Pinks M.D.    On: 10/02/2013 08:54   Mr Brain Wo Contrast  10/01/2013   CLINICAL DATA:  Altered mental status.  Loss of consciousness.  EXAM: MRI HEAD WITHOUT CONTRAST  TECHNIQUE: Multiplanar, multiecho pulse sequences of the brain and surrounding structures were obtained without intravenous contrast.  COMPARISON:  Head CT 09/24/2013, MRI 07/02/2010  FINDINGS: Diffusion imaging shows multiple punctate foci of acute infarction in the left middle cerebral artery distribution of the left hemisphere consistent with multiple micro embolic infarctions. No branch vessel or large vessel territory infarction.  There chronic small vessel ischemic changes affecting the pons. There are multiple old small vessel cerebellar infarctions bilaterally. The cerebral hemispheres show a background pattern of confluent chronic small vessel disease throughout the brain and multiple cortical and subcortical infarcts scattered about both  hemispheres. No evidence of neoplastic mass lesion, hemorrhage, hydrocephalus or extra-axial collection. No pituitary mass. No inflammatory sinus disease. No skull or skullbase lesion.  IMPRESSION: Background pattern of chronic small vessel disease throughout the brain. Multiple cortical and subcortical infarctions throughout cerebellum and both cerebral hemispheres.  Several scattered punctate acute infarctions within the left middle cerebral artery distribution in the left hemisphere consistent with micro embolic infarctions.   Electronically Signed   By: Nelson Chimes M.D.   On: 10/01/2013 16:03   Labs: Basic Metabolic Panel:  Recent Labs Lab 09/30/13 1641 09/30/13 2340 10/03/13 0453  NA 133*  --  140  K 4.7  --  4.8  CL 98  --  107  CO2 23  --  23  GLUCOSE 122*  --  104*  BUN 59*  --  35*  CREATININE 2.01* 1.82* 1.62*  CALCIUM 8.9  --  8.7  MG  --  2.1  --   PHOS  --  2.7  --    CBC:  Recent Labs Lab 09/30/13 1641 09/30/13 2340 10/03/13 0453  WBC 6.0 5.8 4.1  NEUTROABS 4.7  --   --     HGB 9.6* 10.1* 8.9*  HCT 28.8* 30.4* 26.6*  MCV 81.4 81.5 82.4  PLT 138* 151 132*   CBG:  Recent Labs Lab 10/02/13 1201 10/02/13 1700 10/02/13 2216 10/03/13 0728 10/03/13 1138  GLUCAP 119* 127* 98 115* 91       Signed:  GHERGHE, COSTIN  [facility] 10/04/2013, 12:03 PM

## 2013-10-06 ENCOUNTER — Telehealth: Payer: Self-pay | Admitting: Vascular Surgery

## 2013-10-06 ENCOUNTER — Other Ambulatory Visit: Payer: Self-pay

## 2013-10-06 DIAGNOSIS — I6529 Occlusion and stenosis of unspecified carotid artery: Secondary | ICD-10-CM

## 2013-10-06 LAB — ANCA SCREEN W REFLEX TITER
ATYPICAL P-ANCA SCREEN: NEGATIVE
c-ANCA Screen: POSITIVE — AB
p-ANCA Screen: NEGATIVE

## 2013-10-06 LAB — MPO/PR-3 (ANCA) ANTIBODIES: Serine Protease 3: <1

## 2013-10-06 LAB — ANCA TITERS: C-ANCA: 1:40 {titer} — ABNORMAL HIGH

## 2013-10-06 LAB — EXTRACTABLE NUCLEAR ANTIGEN ANTIBODY
ENA SM AB SER-ACNC: NEGATIVE
SCLERODERMA (SCL-70) (ENA) ANTIBODY, IGG: NEGATIVE
SSA (RO) (ENA) ANTIBODY, IGG: NEGATIVE
SSB (LA) (ENA) ANTIBODY, IGG: NEGATIVE
Sm/rnp: <1
ds DNA Ab: <1 IU/mL

## 2013-10-06 LAB — ANA: ANA: NEGATIVE

## 2013-10-06 LAB — JO-1 ANTIBODY-IGG: Jo-1 Antibody, IgG: <1

## 2013-10-06 NOTE — Telephone Encounter (Addendum)
Message copied by Fredrich Birks on Mon Oct 06, 2013  2:26 PM ------      Message from: Phillips Odor      Created: Mon Oct 06, 2013 10:28 AM      Regarding: vasc lab and office appt. 6 mos.                   ----- Message -----         From: Sherren Kerns, MD         Sent: 10/02/2013   5:14 PM           To: [facility] Charge Pool            Pt needs office visit and carotid duplex 6 months            Leonette Most ------  10/06/13: patient could not understand me over the phone, asked that I speak with wife. She understood that he needed to be seen in 6 months. Mailed appointment letter to home address, dpm

## 2013-10-08 LAB — COMPLEMENT, TOTAL: Compl, Total (CH50): 58 U/mL (ref 31–60)

## 2013-10-13 ENCOUNTER — Telehealth: Payer: Self-pay | Admitting: Cardiovascular Disease

## 2013-10-13 MED ORDER — PREDNISONE 20 MG PO TABS: ORAL_TABLET | ORAL | Status: DC

## 2013-10-13 NOTE — Telephone Encounter (Signed)
The colchicine was for his pericardial effusion/ pericarditis. There are not alternative meds for this

## 2013-10-13 NOTE — Telephone Encounter (Signed)
Please call,wants to know if he can change his Colchicine. She will give you the details when you call.

## 2013-10-13 NOTE — Telephone Encounter (Signed)
I spoke with Dr Herbie Baltimore.  We can add Prednisone for 2 weeks in place of the colchicine.  Prednisone starting at 60mg  daily.    Patient notified and RX sent to pharmacy.  Also, [facility] notified of med change

## 2013-10-13 NOTE — Telephone Encounter (Signed)
Returned call to [employer] Social Worker  Patient was Rx'ed colchicine 0.6mg  QD post hospital by Dr. Elvera Lennox (hospitalist) - for off label use for heart issues (she mentioned fluid?). Patient has not taken as it cost $161/month and she was asking for alternatives and how detrimental it is that he has not taken this medication.   RN informed Child psychotherapist and patient that this medication is not generally prescribed by a cardiologist and since one of Dr. Hazle Coca partners did not prescribe, we would have to defer to him on alteratives and/or refilling it (or deferring to PCP)  Message routed to Dr. Rockie Neighbours, RN

## 2013-10-15 ENCOUNTER — Telehealth: Payer: Self-pay | Admitting: Cardiology

## 2013-10-15 NOTE — Telephone Encounter (Signed)
Just a FYI----Patient has been seen for the past couple of weeks for in home PT.  Patient's blood pressure has been running low and he has stopped taking his blood pressure medications on the advice of his primary care physician and he is feeling fine.  Dr. Herbie Baltimore prescribed Prednisone for this patient and he is taking that.  The patient's primary care physician has not called back with any further instructions regarding his blood pressure medication.  You may call Lupita Leash if you have any questions or advice.[name]

## 2013-10-28 ENCOUNTER — Encounter: Payer: Self-pay | Admitting: Cardiovascular Disease

## 2013-10-28 ENCOUNTER — Ambulatory Visit (INDEPENDENT_AMBULATORY_CARE_PROVIDER_SITE_OTHER): Payer: Medicare Other | Admitting: Cardiovascular Disease

## 2013-10-28 VITALS — BP 122/70 | HR 71 | Ht 67.25 in | Wt 173.1 lb

## 2013-10-28 DIAGNOSIS — I319 Disease of pericardium, unspecified: Secondary | ICD-10-CM

## 2013-10-28 DIAGNOSIS — I6529 Occlusion and stenosis of unspecified carotid artery: Secondary | ICD-10-CM

## 2013-10-28 DIAGNOSIS — I10 Essential (primary) hypertension: Secondary | ICD-10-CM

## 2013-10-28 DIAGNOSIS — I313 Pericardial effusion (noninflammatory): Secondary | ICD-10-CM

## 2013-10-28 DIAGNOSIS — I739 Peripheral vascular disease, unspecified: Secondary | ICD-10-CM

## 2013-10-28 DIAGNOSIS — I3139 Other pericardial effusion (noninflammatory): Secondary | ICD-10-CM

## 2013-10-28 NOTE — Assessment & Plan Note (Signed)
Incidentally noted at the time of recent 2-D echo performed 10/02/13. It was large and free flowing. LV function was normal. He was put on colchicine. We will recheck a 2-D echocardiogram

## 2013-10-28 NOTE — Assessment & Plan Note (Signed)
I last saw the patient back several years ago. He was written urgently sent to me by Dr. Forest Becker , his podiatrist for a nonhealing ulcer on his left heel. I performed angiography on him 02/10/10 revealing a diffusely diseased left SFA which I stented.

## 2013-10-28 NOTE — Patient Instructions (Signed)
Your physician has requested that you have an echocardiogram. Echocardiography is a painless test that uses sound waves to create images of your heart. It provides your doctor with information about the size and shape of your heart and how well your heart's chambers and valves are working. This procedure takes approximately one hour. There are no restrictions for this procedure.   Your physician recommends that you schedule a follow-up appointment in: 3 MONTHS WITH A MIDLEVEL AND 12 MONTHS WITH DR. Allyson Sabal.

## 2013-10-28 NOTE — Assessment & Plan Note (Signed)
Controlled on current medications 

## 2013-10-28 NOTE — Progress Notes (Signed)
10/28/2013 Barry LawmanRobert White   05/06/1933  [id number]  Primary Physician [name],Barry A, MD Primary Cardiologist: Barry GessJonathan J. Berry MD Barry RenoFACP,FACC,FAHA, FSCAI  HPI:  The patient is White very pleasant 80 year old mildly overweight married Caucasian male, father of 2, grandfather to 499 grandchildren, who is accompanied by his wife today. I saw him 04/14/11. He is White retired Education administratorpainter. He was initially referred to me by Dr. Geradine GirtJeffrey White for peripheral vascular evaluation because of White nonhealing heel ulcer.   His history is remarkable for White 50-pack-year history of tobacco abuse, having quit 10 years ago. He has non-insulin-requiring diabetes and hypertension as well as hyperlipidemia. Dopplers in our office revealed White right ABI of 0.6 and initially White left ABI at 0.76. I performed angiography on him, February 10, 2010, revealing White 60% right renal artery stenosis, short segment occlusion of the mid right SFA with 1-vessel runoff near the peroneal, and White long, diffusely diseased left SFA with White 90% popliteal lesion and 3-vessel runoff. I atherectomized his popliteal from 90% to 30% with White 4 x 2 mm angioscope cutting balloon and I stented his entire left SFA with 3 overlapping nitinol self-expanding stents. His followup Dopplers revealed White left ABI of greater than 1. His left leg felt better and he also ultimately healed. His most recent Dopplers, performed February 27, 2011, revealed White left ABI of 1.1 with patent stents, patent popliteal. He did have White high-frequency signal in his distal left common femoral artery, though there was really no significant disease there on angiography. He did have White normal 2D echocardiogram and Persantine Myoview stress test in October of 2011.  I have not seen him back for over 20 years. He was recently admitted to the hospital for orthostasis. White 2-D echo was performed and showed normal LV systolic function with White large pericardial effusion. He was begun on colchicine  empirically.    Current Outpatient Prescriptions  Medication Sig Dispense Refill  . aspirin EC 81 MG EC tablet Take 1 tablet (81 mg total) by mouth daily.  90 tablet  1  . BD INSULIN SYRINGE ULTRAFINE 31G X 5/16" 0.3 ML MISC       . ferrous sulfate 325 (65 FE) MG tablet Take 1 tablet (325 mg total) by mouth 3 (three) times daily with meals.  90 tablet  2  . glucose 4 GM chewable tablet Chew 1 tablet by mouth as needed for low blood sugar.      . HYDROcodone-acetaminophen (NORCO/VICODIN) 5-325 MG per tablet Take 1 tablet by mouth every 4 (four) hours as needed for moderate pain (foot pain).       . insulin aspart (NOVOLOG) 100 UNIT/ML injection Inject 10 Units into the skin 2 (two) times daily.      . predniSONE (DELTASONE) 20 MG tablet Take 60mg  (3 tablets) for 3 days by mouth. Decrease to 40mg  (2 tablets) for 3 days, then 1 tablet (20mg ) for 3 days then 1/2 tablet (10mg ) for 5 days. Total 14 days.  21 tablet  0  . simvastatin (ZOCOR) 10 MG tablet Take 10 mg by mouth daily with lunch.      . valsartan (DIOVAN) 80 MG tablet Take 1 tablet (80 mg total) by mouth daily.  30 tablet  1   No current facility-administered medications for this visit.    No Known Allergies  History   Social History  . Marital Status: Married    Spouse Name: N/White    Number of Children: N/White  .  Years of Education: N/White   Occupational History  . Not on file.   Social History Main Topics  . Smoking status: Never Smoker   . Smokeless tobacco: Never Used  . Alcohol Use: No  . Drug Use: No  . Sexual Activity: Not on file   Other Topics Concern  . Not on file   Social History Narrative  . No narrative on file     Review of Systems: General: negative for chills, fever, night sweats or weight changes.  Cardiovascular: negative for chest pain, dyspnea on exertion, edema, orthopnea, palpitations, paroxysmal nocturnal dyspnea or shortness of breath Dermatological: negative for rash Respiratory: negative for  cough or wheezing Urologic: negative for hematuria Abdominal: negative for nausea, vomiting, diarrhea, bright red blood per rectum, melena, or hematemesis Neurologic: negative for visual changes, syncope, or dizziness All other systems reviewed and are otherwise negative except as noted above.    Blood pressure 122/70, pulse 71, height 5' 7.25" (1.708 m), weight 173 lb 1.6 oz (78.518 kg).  General appearance: alert and no distress Neck: no adenopathy, no carotid bruit, no JVD, supple, symmetrical, trachea midline and thyroid not enlarged, symmetric, no tenderness/mass/nodules Lungs: clear to auscultation bilaterally Heart: regular rate and rhythm, S1, S2 normal, no murmur, click, rub or gallop Extremities: extremities normal, atraumatic, no cyanosis or edema  EKG /71 with lower limb voltage  ASSESSMENT AND PLAN:   PAD (peripheral artery disease) I last saw the patient back several years ago. He was written urgently sent to me by Dr. Forest Becker , his podiatrist for White nonhealing ulcer on his left heel. I performed angiography on him 02/10/10 revealing White diffusely diseased left SFA which I stented.  Pericardial effusion Incidentally noted at the time of recent 2-D echo performed 10/02/13. It was large and free flowing. LV function was normal. He was put on colchicine. We will recheck White 2-D echocardiogram  Hypertension Controlled on current medications      Barry Gess MD [facility] [place], [facility] 10/28/2013 3:57 PM

## 2013-10-29 ENCOUNTER — Other Ambulatory Visit: Payer: Self-pay | Admitting: *Deleted

## 2013-10-29 ENCOUNTER — Telehealth: Payer: Self-pay | Admitting: *Deleted

## 2013-10-29 DIAGNOSIS — I739 Peripheral vascular disease, unspecified: Secondary | ICD-10-CM

## 2013-10-29 NOTE — Telephone Encounter (Signed)
Left a message for patient to return a call to me.

## 2013-10-30 ENCOUNTER — Telehealth (HOSPITAL_COMMUNITY): Payer: Self-pay | Admitting: *Deleted

## 2013-11-01 ENCOUNTER — Emergency Department (HOSPITAL_COMMUNITY): Payer: Medicare Other

## 2013-11-01 ENCOUNTER — Inpatient Hospital Stay (HOSPITAL_COMMUNITY)
Admission: EM | Admit: 2013-11-01 | Discharge: 2013-11-13 | DRG: 238 | Disposition: A | Discharge status: Rehabilitation Facility | Payer: Medicare Other | Attending: Internal Medicine | Admitting: Internal Medicine

## 2013-11-01 ENCOUNTER — Encounter (HOSPITAL_COMMUNITY): Payer: Self-pay | Admitting: Emergency Medicine

## 2013-11-01 DIAGNOSIS — N183 Chronic kidney disease, stage 3 unspecified: Secondary | ICD-10-CM | POA: Diagnosis present

## 2013-11-01 DIAGNOSIS — R079 Chest pain, unspecified: Secondary | ICD-10-CM | POA: Diagnosis present

## 2013-11-01 DIAGNOSIS — Z794 Long term (current) use of insulin: Secondary | ICD-10-CM

## 2013-11-01 DIAGNOSIS — I319 Disease of pericardium, unspecified: Secondary | ICD-10-CM | POA: Diagnosis not present

## 2013-11-01 DIAGNOSIS — E1142 Type 2 diabetes mellitus with diabetic polyneuropathy: Secondary | ICD-10-CM | POA: Diagnosis present

## 2013-11-01 DIAGNOSIS — Z7982 Long term (current) use of aspirin: Secondary | ICD-10-CM

## 2013-11-01 DIAGNOSIS — E119 Type 2 diabetes mellitus without complications: Secondary | ICD-10-CM | POA: Diagnosis present

## 2013-11-01 DIAGNOSIS — N5089 Other specified disorders of the male genital organs: Secondary | ICD-10-CM | POA: Diagnosis present

## 2013-11-01 DIAGNOSIS — I4891 Unspecified atrial fibrillation: Secondary | ICD-10-CM | POA: Diagnosis present

## 2013-11-01 DIAGNOSIS — R600 Localized edema: Secondary | ICD-10-CM

## 2013-11-01 DIAGNOSIS — I314 Cardiac tamponade: Secondary | ICD-10-CM | POA: Diagnosis present

## 2013-11-01 DIAGNOSIS — IMO0002 Reserved for concepts with insufficient information to code with codable children: Secondary | ICD-10-CM | POA: Diagnosis present

## 2013-11-01 DIAGNOSIS — E1149 Type 2 diabetes mellitus with other diabetic neurological complication: Secondary | ICD-10-CM | POA: Diagnosis present

## 2013-11-01 DIAGNOSIS — L97509 Non-pressure chronic ulcer of other part of unspecified foot with unspecified severity: Secondary | ICD-10-CM | POA: Diagnosis present

## 2013-11-01 DIAGNOSIS — E1165 Type 2 diabetes mellitus with hyperglycemia: Secondary | ICD-10-CM | POA: Diagnosis present

## 2013-11-01 DIAGNOSIS — I129 Hypertensive chronic kidney disease with stage 1 through stage 4 chronic kidney disease, or unspecified chronic kidney disease: Secondary | ICD-10-CM | POA: Diagnosis present

## 2013-11-01 DIAGNOSIS — D696 Thrombocytopenia, unspecified: Secondary | ICD-10-CM | POA: Diagnosis present

## 2013-11-01 DIAGNOSIS — R55 Syncope and collapse: Secondary | ICD-10-CM | POA: Diagnosis present

## 2013-11-01 DIAGNOSIS — I739 Peripheral vascular disease, unspecified: Secondary | ICD-10-CM | POA: Diagnosis present

## 2013-11-01 DIAGNOSIS — Z7901 Long term (current) use of anticoagulants: Secondary | ICD-10-CM

## 2013-11-01 DIAGNOSIS — D638 Anemia in other chronic diseases classified elsewhere: Secondary | ICD-10-CM | POA: Diagnosis present

## 2013-11-01 DIAGNOSIS — R197 Diarrhea, unspecified: Secondary | ICD-10-CM | POA: Diagnosis present

## 2013-11-01 DIAGNOSIS — K4090 Unilateral inguinal hernia, without obstruction or gangrene, not specified as recurrent: Secondary | ICD-10-CM | POA: Diagnosis present

## 2013-11-01 DIAGNOSIS — I10 Essential (primary) hypertension: Secondary | ICD-10-CM | POA: Diagnosis present

## 2013-11-01 DIAGNOSIS — I3139 Other pericardial effusion (noninflammatory): Secondary | ICD-10-CM | POA: Diagnosis present

## 2013-11-01 DIAGNOSIS — E1169 Type 2 diabetes mellitus with other specified complication: Secondary | ICD-10-CM

## 2013-11-01 DIAGNOSIS — Z8673 Personal history of transient ischemic attack (TIA), and cerebral infarction without residual deficits: Secondary | ICD-10-CM

## 2013-11-01 DIAGNOSIS — I5189 Other ill-defined heart diseases: Secondary | ICD-10-CM | POA: Diagnosis present

## 2013-11-01 DIAGNOSIS — I313 Pericardial effusion (noninflammatory): Secondary | ICD-10-CM

## 2013-11-01 DIAGNOSIS — L97529 Non-pressure chronic ulcer of other part of left foot with unspecified severity: Secondary | ICD-10-CM

## 2013-11-01 LAB — I-STAT TROPONIN, ED: Troponin i, poc: 0.04 ng/mL (ref 0.00–0.08)

## 2013-11-01 LAB — BASIC METABOLIC PANEL
Anion gap: 15 (ref 5–15)
BUN: 43 mg/dL — ABNORMAL HIGH (ref 6–23)
CO2: 24 mEq/L (ref 19–32)
CREATININE: 1.49 mg/dL — AB (ref 0.50–1.35)
Calcium: 9 mg/dL (ref 8.4–10.5)
Chloride: 95 mEq/L — ABNORMAL LOW (ref 96–112)
GFR, EST AFRICAN AMERICAN: 50 mL/min — AB (ref >90)
GFR, EST NON AFRICAN AMERICAN: 43 mL/min — AB (ref >90)
Glucose, Bld: 146 mg/dL — ABNORMAL HIGH (ref 70–99)
Potassium: 4.3 mEq/L (ref 3.7–5.3)
Sodium: 134 mEq/L — ABNORMAL LOW (ref 137–147)

## 2013-11-01 LAB — CBC
HCT: 31.1 % — ABNORMAL LOW (ref 39.0–52.0)
Hemoglobin: 10.4 g/dL — ABNORMAL LOW (ref 13.0–17.0)
MCH: 28.4 pg (ref 26.0–34.0)
MCHC: 33.4 g/dL (ref 30.0–36.0)
MCV: 85 fL (ref 78.0–100.0)
Platelets: 126 10*3/uL — ABNORMAL LOW (ref 150–400)
RBC: 3.66 MIL/uL — ABNORMAL LOW (ref 4.22–5.81)
RDW: 15.6 % — AB (ref 11.5–15.5)
WBC: 7.7 10*3/uL (ref 4.0–10.5)

## 2013-11-01 NOTE — ED Notes (Signed)
Per EMS, pt coming from home today for intermittent CP and palpitations starting at 1700 today. Pt report short periods of CP that came and went. Pt is CP free at this time. Pt reported that he was having palpitations until EMS arrival but is not having them now. NAD at this time. EMS gave 324 of aspirin in route. 20 gauge left hand.

## 2013-11-01 NOTE — ED Notes (Signed)
MD at bedside. 

## 2013-11-02 ENCOUNTER — Observation Stay (HOSPITAL_COMMUNITY): Payer: Medicare Other

## 2013-11-02 ENCOUNTER — Encounter (HOSPITAL_COMMUNITY): Payer: Self-pay | Admitting: *Deleted

## 2013-11-02 DIAGNOSIS — R197 Diarrhea, unspecified: Secondary | ICD-10-CM

## 2013-11-02 DIAGNOSIS — N183 Chronic kidney disease, stage 3 unspecified: Secondary | ICD-10-CM | POA: Diagnosis not present

## 2013-11-02 DIAGNOSIS — I319 Disease of pericardium, unspecified: Secondary | ICD-10-CM

## 2013-11-02 DIAGNOSIS — R079 Chest pain, unspecified: Secondary | ICD-10-CM | POA: Diagnosis present

## 2013-11-02 DIAGNOSIS — R609 Edema, unspecified: Secondary | ICD-10-CM

## 2013-11-02 DIAGNOSIS — I10 Essential (primary) hypertension: Secondary | ICD-10-CM

## 2013-11-02 DIAGNOSIS — L97509 Non-pressure chronic ulcer of other part of unspecified foot with unspecified severity: Secondary | ICD-10-CM | POA: Diagnosis present

## 2013-11-02 DIAGNOSIS — D638 Anemia in other chronic diseases classified elsewhere: Secondary | ICD-10-CM | POA: Diagnosis not present

## 2013-11-02 DIAGNOSIS — E119 Type 2 diabetes mellitus without complications: Secondary | ICD-10-CM | POA: Diagnosis not present

## 2013-11-02 DIAGNOSIS — D696 Thrombocytopenia, unspecified: Secondary | ICD-10-CM | POA: Diagnosis present

## 2013-11-02 DIAGNOSIS — N5089 Other specified disorders of the male genital organs: Secondary | ICD-10-CM | POA: Diagnosis present

## 2013-11-02 LAB — URINALYSIS, ROUTINE W REFLEX MICROSCOPIC
BILIRUBIN URINE: NEGATIVE
Glucose, UA: NEGATIVE mg/dL
KETONES UR: NEGATIVE mg/dL
LEUKOCYTES UA: NEGATIVE
Nitrite: NEGATIVE
PH: 5 (ref 5.0–8.0)
Protein, ur: 100 mg/dL — AB
Specific Gravity, Urine: 1.023 (ref 1.005–1.030)
Urobilinogen, UA: 1 mg/dL (ref 0.0–1.0)

## 2013-11-02 LAB — BASIC METABOLIC PANEL
Anion gap: 11 (ref 5–15)
BUN: 42 mg/dL — AB (ref 6–23)
CHLORIDE: 100 meq/L (ref 96–112)
CO2: 24 mEq/L (ref 19–32)
Calcium: 8.6 mg/dL (ref 8.4–10.5)
Creatinine, Ser: 1.63 mg/dL — ABNORMAL HIGH (ref 0.50–1.35)
GFR calc Af Amer: 45 mL/min — ABNORMAL LOW (ref >90)
GFR calc non Af Amer: 39 mL/min — ABNORMAL LOW (ref >90)
Glucose, Bld: 180 mg/dL — ABNORMAL HIGH (ref 70–99)
POTASSIUM: 4 meq/L (ref 3.7–5.3)
Sodium: 135 mEq/L — ABNORMAL LOW (ref 137–147)

## 2013-11-02 LAB — URINE MICROSCOPIC-ADD ON

## 2013-11-02 LAB — SEDIMENTATION RATE: SED RATE: 61 mm/h — AB (ref 0–16)

## 2013-11-02 LAB — TROPONIN I
Troponin I: <0.3 ng/mL (ref <0.30)
Troponin I: <0.3 ng/mL (ref <0.30)

## 2013-11-02 LAB — GLUCOSE, CAPILLARY
GLUCOSE-CAPILLARY: 58 mg/dL — AB (ref 70–99)
GLUCOSE-CAPILLARY: 90 mg/dL (ref 70–99)
Glucose-Capillary: 200 mg/dL — ABNORMAL HIGH (ref 70–99)
Glucose-Capillary: 126 mg/dL — ABNORMAL HIGH (ref 70–99)
Glucose-Capillary: 107 mg/dL — ABNORMAL HIGH (ref 70–99)
Glucose-Capillary: 149 mg/dL — ABNORMAL HIGH (ref 70–99)

## 2013-11-02 LAB — CBC
HEMATOCRIT: 28.5 % — AB (ref 39.0–52.0)
HEMOGLOBIN: 9.4 g/dL — AB (ref 13.0–17.0)
MCH: 28.6 pg (ref 26.0–34.0)
MCHC: 33 g/dL (ref 30.0–36.0)
MCV: 86.6 fL (ref 78.0–100.0)
Platelets: DECREASED 10*3/uL (ref 150–400)
RBC: 3.29 MIL/uL — AB (ref 4.22–5.81)
RDW: 15.6 % — ABNORMAL HIGH (ref 11.5–15.5)
WBC: 7.2 10*3/uL (ref 4.0–10.5)

## 2013-11-02 LAB — HEMOGLOBIN A1C
Hgb A1c MFr Bld: 5.9 % — ABNORMAL HIGH (ref <5.7)
MEAN PLASMA GLUCOSE: 123 mg/dL — AB (ref <117)

## 2013-11-02 LAB — CLOSTRIDIUM DIFFICILE BY PCR: Toxigenic C. Difficile by PCR: NEGATIVE

## 2013-11-02 LAB — MAGNESIUM: MAGNESIUM: 2 mg/dL (ref 1.5–2.5)

## 2013-11-02 LAB — CBG MONITORING, ED: Glucose-Capillary: 148 mg/dL — ABNORMAL HIGH (ref 70–99)

## 2013-11-02 MED ORDER — ASPIRIN EC 325 MG PO TBEC
325.0000 mg | DELAYED_RELEASE_TABLET | Freq: Every day | ORAL | Status: DC
  Administered 2013-11-02 – 2013-11-07 (×6): 325 mg via ORAL
  Filled 2013-11-02 (×7): qty 1

## 2013-11-02 MED ORDER — INSULIN ASPART 100 UNIT/ML ~~LOC~~ SOLN
10.0000 [IU] | Freq: Two times a day (BID) | SUBCUTANEOUS | Status: DC
  Administered 2013-11-02: 09:00:00 via SUBCUTANEOUS

## 2013-11-02 MED ORDER — SODIUM CHLORIDE 0.9 % IV SOLN
INTRAVENOUS | Status: AC
  Administered 2013-11-02: 04:00:00 via INTRAVENOUS

## 2013-11-02 MED ORDER — SIMVASTATIN 10 MG PO TABS
10.0000 mg | ORAL_TABLET | Freq: Every day | ORAL | Status: DC
  Administered 2013-11-02 – 2013-11-13 (×10): 10 mg via ORAL
  Filled 2013-11-02 (×12): qty 1

## 2013-11-02 MED ORDER — ACETAMINOPHEN 650 MG RE SUPP: 650.0000 mg | Freq: Four times a day (QID) | RECTAL | Status: DC | PRN

## 2013-11-02 MED ORDER — OXYCODONE HCL 5 MG PO TABS: 5.0000 mg | ORAL_TABLET | ORAL | Status: DC | PRN

## 2013-11-02 MED ORDER — FERROUS SULFATE 325 (65 FE) MG PO TABS
325.0000 mg | ORAL_TABLET | Freq: Three times a day (TID) | ORAL | Status: DC
  Administered 2013-11-02 – 2013-11-13 (×29): 325 mg via ORAL
  Filled 2013-11-02 (×38): qty 1

## 2013-11-02 MED ORDER — LOPERAMIDE HCL 2 MG PO CAPS
2.0000 mg | ORAL_CAPSULE | Freq: Once | ORAL | Status: AC
  Administered 2013-11-02: 2 mg via ORAL
  Filled 2013-11-02: qty 1

## 2013-11-02 MED ORDER — HYDROMORPHONE HCL PF 1 MG/ML IJ SOLN: 0.5000 mg | INTRAMUSCULAR | Status: DC | PRN

## 2013-11-02 MED ORDER — DOXYCYCLINE HYCLATE 100 MG PO TABS
100.0000 mg | ORAL_TABLET | Freq: Two times a day (BID) | ORAL | Status: DC
  Administered 2013-11-02 – 2013-11-13 (×23): 100 mg via ORAL
  Filled 2013-11-02 (×26): qty 1

## 2013-11-02 MED ORDER — ACETAMINOPHEN 325 MG PO TABS
650.0000 mg | ORAL_TABLET | Freq: Four times a day (QID) | ORAL | Status: DC | PRN
  Administered 2013-11-06: 650 mg via ORAL
  Filled 2013-11-02: qty 2

## 2013-11-02 MED ORDER — SODIUM CHLORIDE 0.9 % IJ SOLN
3.0000 mL | Freq: Two times a day (BID) | INTRAMUSCULAR | Status: DC
  Administered 2013-11-02 – 2013-11-06 (×8): 3 mL via INTRAVENOUS

## 2013-11-02 MED ORDER — ENOXAPARIN SODIUM 30 MG/0.3ML ~~LOC~~ SOLN
30.0000 mg | SUBCUTANEOUS | Status: DC
  Filled 2013-11-02: qty 0.3

## 2013-11-02 MED ORDER — ONDANSETRON HCL 4 MG PO TABS: 4.0000 mg | ORAL_TABLET | Freq: Four times a day (QID) | ORAL | Status: DC | PRN

## 2013-11-02 MED ORDER — ONDANSETRON HCL 4 MG/2ML IJ SOLN: 4.0000 mg | Freq: Four times a day (QID) | INTRAMUSCULAR | Status: DC | PRN

## 2013-11-02 MED ORDER — INSULIN ASPART 100 UNIT/ML ~~LOC~~ SOLN
0.0000 [IU] | Freq: Three times a day (TID) | SUBCUTANEOUS | Status: DC
  Administered 2013-11-03 (×2): 1 [IU] via SUBCUTANEOUS
  Administered 2013-11-04 (×2): 2 [IU] via SUBCUTANEOUS
  Administered 2013-11-04 – 2013-11-06 (×5): 1 [IU] via SUBCUTANEOUS
  Administered 2013-11-06 – 2013-11-07 (×2): 2 [IU] via SUBCUTANEOUS
  Administered 2013-11-08: 1 [IU] via SUBCUTANEOUS
  Administered 2013-11-08: 2 [IU] via SUBCUTANEOUS
  Administered 2013-11-08 – 2013-11-13 (×8): 1 [IU] via SUBCUTANEOUS

## 2013-11-02 MED ORDER — INSULIN ASPART 100 UNIT/ML ~~LOC~~ SOLN: 5.0000 [IU] | Freq: Three times a day (TID) | SUBCUTANEOUS | Status: DC

## 2013-11-02 MED ORDER — ONDANSETRON HCL 4 MG/2ML IJ SOLN
4.0000 mg | Freq: Once | INTRAMUSCULAR | Status: DC
  Filled 2013-11-02: qty 2

## 2013-11-02 MED ORDER — INSULIN ASPART 100 UNIT/ML ~~LOC~~ SOLN: 0.0000 [IU] | Freq: Every day | SUBCUTANEOUS | Status: DC

## 2013-11-02 MED ORDER — ONDANSETRON HCL 4 MG/2ML IJ SOLN
4.0000 mg | Freq: Once | INTRAMUSCULAR | Status: AC
  Administered 2013-11-02: 4 mg via INTRAVENOUS

## 2013-11-02 MED ORDER — ALUM & MAG HYDROXIDE-SIMETH 200-200-20 MG/5ML PO SUSP
30.0000 mL | Freq: Four times a day (QID) | ORAL | Status: DC | PRN
  Filled 2013-11-02: qty 30

## 2013-11-02 NOTE — Progress Notes (Signed)
Subjective:  He came in last night with heart fluttering but does not have any shortness of breath or chest pain. Had a large pericardial effusion with early tamponade by echo criteria but no clinical tamponade noted in early July when he was in with a stroke. He was sent home on cultures and but could not afford this and was later given a course of steroids. No shortness of breath but is eating dinner at present.  Objective:  Vital Signs in the last 24 hours: BP 128/60  Pulse 78  Temp(Src) 98.2 F (36.8 C) (Oral)  Resp 16  Ht 5\' 7"  (1.702 m)  Wt 77.111 kg (170 lb)  BMI 26.62 kg/m2  SpO2 100%  Physical Exam: Pleasant elderly male in no acute distress, not short of breath or tachypnea Lungs:  Clear Cardiac:  Regular rhythm, normal S1 and S2, no S3, no rub noted, 10 mm pulsus paradox noted Abdomen:  Soft, nontender, no masses Extremities:  Left foot in cast  Intake/Output from previous day: 08/01 0701 - 08/02 0700 In: 3 [I.V.:3] Out: 101 [Urine:100; Stool:1]  Weight Filed Weights   11/01/13 2204  Weight: 77.111 kg (170 lb)    Lab Results: Basic Metabolic Panel:  Recent Labs  63/89/37 2221 11/02/13 0445  NA 134* 135*  K 4.3 4.0  CL 95* 100  CO2 24 24  GLUCOSE 146* 180*  BUN 43* 42*  CREATININE 1.49* 1.63*   CBC:  Recent Labs  11/01/13 2221 11/02/13 0445  WBC 7.7 7.2  HGB 10.4* 9.4*  HCT 31.1* 28.5*  MCV 85.0 86.6  PLT 126* PLATELET CLUMPS NOTED ON SMEAR, COUNT APPEARS DECREASED   Cardiac Enzymes:  Recent Labs  11/02/13 0100 11/02/13 1005  TROPONINI <0.30 <0.30    Telemetry: Sinus rhythm  Assessment/Plan:  1. Very large pericardial effusion with a somewhat swinging heart and moderate echo criteria for tamponade with right atrial collapse, mild right ventricular collapse, and reduction and variation of the tricuspid valve velocities 2. Stage III chronic kidney disease 3. Anemia 4. Prior fever   Recommendations:  The patient clinically looks  well and is not tachycardic. His blood pressures maintained. He has a 10 mm pulsus. The effusion is very large and although he does not have clinical tamponade does have echo criteria for tamponade. and he could be a candidate for percutaneous drainage as opposed to pericardial window. In light of his clinical status I would recommend he be kept n.p.o. after midnight for possible percutaneous drainage tomorrow by the interventional cardiologist.  Darden Palmer  MD [facility] Cardiology  11/02/2013, 1:01 PM

## 2013-11-02 NOTE — ED Notes (Signed)
Pulled patient up in bed

## 2013-11-02 NOTE — Progress Notes (Signed)
UR Completed. Camber Ninh, RN, BSN Nurse Case Manager  

## 2013-11-02 NOTE — Progress Notes (Signed)
Pts CBG 58, Dr. Isidoro Donning notified and orders received with insulin changes. Pt asymptomatic, Juice given and lunch tray. Will recheck and closely monitor

## 2013-11-02 NOTE — Progress Notes (Addendum)
Patient ID: Barry LawmanRobert White  male  FAO:[id number]    DOB: 11/30/1933    DOA: 11/01/2013  PCP: [name]  Addendum:  Echo reviewed  - EF 60-65%, grade 1diastolic dysfunction - Pericardium, extracardiac: A large pericardial effusion was identified circumferential to the heart. There was chamber collapse. There was right ventricular chamber collapse. There was right ventricular chamber collapse. Features were consistent with moderate tamponade physiology.  - Called CT surgery consult, d/w [name], will see patient  - Cardiolog following  - closely monitor, patient currently hemodynamically stable, low threshold to transfer to stepdown.    Barry M.D. [facility] 11/02/2013, 11:49 AM  Pager: [phone]        Assessment/Plan: Principal Problem:   Chest pain with pericardial effusion, no clinical evidence of tamponade - Patient ruled out for acute ACS, denies any specific chest pain, states "fluttering sensation"that brought him to ER. - Cardiology consulted, recommended 2-D echocardiogram, recently completed steroid course  Active Problems:   Diabetes mellitus without complication - Obtain hemoglobin A1c, continue sliding scale insulin, meal coverage  Chronic left foot ulcer: Follows Barry. Lajoyce White - Has Roland RackUnna White dressing, weeping, placed on doxycycline, - wound care consult     Hypertension: stable  Diarrhea - Will check C. difficile although unlikely, patient was not on any antibiotics, one dose of Imodium  Scrotal edema - Will check scrotal ultrasound to rule out any hydrocele/epididymitis or any malignancy  Thrombocytopenia - Monitor counts, DC Lovenox    CKD (chronic kidney disease) stage 3, GFR 30-59 ml/min - gentle hydration, baseline Cr 1.4-1.6  DVT Prophylaxis: SCD  Code Status: Full Code  Family Communication:  Disposition:  Consultants:  none  Procedures:  2-D  echo  Antibiotics:  Doxycycline    Subjective: Patient seen and examined, no chest pain or shortness of breath at this time, having diarrhea  Objective: Weight change:   Intake/Output Summary (Last 24 hours) at 11/02/13 0916 Last data filed at 11/02/13 0800  Gross per 24 hour  Intake    243 ml  Output    102 ml  Net    141 ml   Blood pressure 116/49, pulse 78, temperature 98.2 F (36.8 C), temperature source Oral, resp. rate 16, height 5\' 7"  (1.702 m), weight 77.111 kg (170 lb), SpO2 100.00%.  Physical Exam: General: Alert and awake, oriented x3, not in any acute distress. CVS: S1-S2 clear, no murmur rubs or gallops Chest: CTA B. Abdomen: soft nontender, nondistended, normal bowel sounds  Extremities: no cyanosis, clubbing , dressing intact on the left lower extremity Neuro: Cranial nerves II-XII intact, no focal neurological deficits  Lab Results: Basic Metabolic Panel:  Recent Labs Lab 11/01/13 2221 11/02/13 0445  NA 134* 135*  K 4.3 4.0  CL 95* 100  CO2 24 24  GLUCOSE 146* 180*  BUN 43* 42*  CREATININE 1.49* 1.63*  CALCIUM 9.0 8.6   Liver Function Tests: No results found for this basename: AST, ALT, ALKPHOS, BILITOT, PROT, ALBUMIN,  in the last 168 hours No results found for this basename: LIPASE, AMYLASE,  in the last 168 hours No results found for this basename: AMMONIA,  in the last 168 hours CBC:  Recent Labs Lab 11/01/13 2221 11/02/13 0445  WBC 7.7 7.2  HGB 10.4* 9.4*  HCT 31.1* 28.5*  MCV 85.0 86.6  PLT 126* PLATELET CLUMPS NOTED ON SMEAR, COUNT APPEARS DECREASED   Cardiac Enzymes:  Recent Labs Lab 11/02/13 0100  TROPONINI <0.30   BNP: No components  found with this basename: POCBNP,  CBG:  Recent Labs Lab 11/02/13 0106 11/02/13 0356 11/02/13 0735  GLUCAP 148* 200* 126*     Micro Results: No results found for this or any previous visit (from the past 240 hour(s)).  Studies/Results: Dg Chest Port 1 View  11/01/2013    CLINICAL DATA:  Chest pain and palpitations.  EXAM: PORTABLE CHEST - 1 VIEW  COMPARISON:  None.  FINDINGS: Shallow inspiration. Cardiac enlargement. Pulmonary vascularity is normal. No consolidation or edema. No blunting of costophrenic angles. No pneumothorax. Calcification of aorta. Degenerative changes in the shoulders.  IMPRESSION: Cardiac enlargement.  No evidence of active pulmonary disease.   Electronically Signed   By: Burman Nieves M.D.   On: 11/01/2013 22:45    Medications: Scheduled Meds: . aspirin EC  325 mg Oral Daily  . doxycycline  100 mg Oral Q12H  . enoxaparin (LOVENOX) injection  30 mg Subcutaneous Q24H  . ferrous sulfate  325 mg Oral TID WC  . insulin aspart  10 Units Subcutaneous BID WC  . loperamide  2 mg Oral Once  . simvastatin  10 mg Oral Q lunch  . sodium chloride  3 mL Intravenous Q12H      LOS: 1 day   Barry M.D. [facility] 11/02/2013, 9:16 AM Pager: [phone]  If 7PM-7AM, please contact night-coverage [email or web address] Password [email or web address]  **Disclaimer: This note was dictated with voice recognition software. Similar sounding words can inadvertently be transcribed and this note may contain transcription errors which may not have been corrected upon publication of note.**
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
## 2013-11-02 NOTE — ED Notes (Signed)
Phlebotomy at bedside.
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
## 2013-11-02 NOTE — ED Notes (Signed)
Transporting patient to new room assignment. 

## 2013-11-02 NOTE — Progress Notes (Signed)
  Echocardiogram 2D Echocardiogram has been performed.  [place] 11/02/2013, 11:30 AM

## 2013-11-03 ENCOUNTER — Encounter (HOSPITAL_COMMUNITY)
Admission: EM | Disposition: A | Discharge status: Rehabilitation Facility | Payer: Self-pay | Source: Home / Self Care | Attending: Internal Medicine

## 2013-11-03 DIAGNOSIS — R609 Edema, unspecified: Secondary | ICD-10-CM | POA: Diagnosis not present

## 2013-11-03 DIAGNOSIS — Z794 Long term (current) use of insulin: Secondary | ICD-10-CM | POA: Diagnosis not present

## 2013-11-03 DIAGNOSIS — J90 Pleural effusion, not elsewhere classified: Secondary | ICD-10-CM | POA: Diagnosis not present

## 2013-11-03 DIAGNOSIS — I319 Disease of pericardium, unspecified: Secondary | ICD-10-CM | POA: Diagnosis present

## 2013-11-03 DIAGNOSIS — R55 Syncope and collapse: Secondary | ICD-10-CM | POA: Diagnosis present

## 2013-11-03 DIAGNOSIS — E1165 Type 2 diabetes mellitus with hyperglycemia: Secondary | ICD-10-CM | POA: Diagnosis present

## 2013-11-03 DIAGNOSIS — R5381 Other malaise: Secondary | ICD-10-CM | POA: Diagnosis not present

## 2013-11-03 DIAGNOSIS — I5189 Other ill-defined heart diseases: Secondary | ICD-10-CM | POA: Diagnosis present

## 2013-11-03 DIAGNOSIS — I129 Hypertensive chronic kidney disease with stage 1 through stage 4 chronic kidney disease, or unspecified chronic kidney disease: Secondary | ICD-10-CM | POA: Diagnosis present

## 2013-11-03 DIAGNOSIS — K4090 Unilateral inguinal hernia, without obstruction or gangrene, not specified as recurrent: Secondary | ICD-10-CM | POA: Diagnosis present

## 2013-11-03 DIAGNOSIS — I314 Cardiac tamponade: Secondary | ICD-10-CM | POA: Diagnosis present

## 2013-11-03 DIAGNOSIS — N183 Chronic kidney disease, stage 3 unspecified: Secondary | ICD-10-CM

## 2013-11-03 DIAGNOSIS — D638 Anemia in other chronic diseases classified elsewhere: Secondary | ICD-10-CM | POA: Diagnosis present

## 2013-11-03 DIAGNOSIS — R079 Chest pain, unspecified: Secondary | ICD-10-CM | POA: Diagnosis not present

## 2013-11-03 DIAGNOSIS — Z5189 Encounter for other specified aftercare: Secondary | ICD-10-CM | POA: Diagnosis not present

## 2013-11-03 DIAGNOSIS — I150 Renovascular hypertension: Secondary | ICD-10-CM | POA: Diagnosis not present

## 2013-11-03 DIAGNOSIS — Z7901 Long term (current) use of anticoagulants: Secondary | ICD-10-CM | POA: Diagnosis not present

## 2013-11-03 DIAGNOSIS — N5089 Other specified disorders of the male genital organs: Secondary | ICD-10-CM | POA: Diagnosis present

## 2013-11-03 DIAGNOSIS — R197 Diarrhea, unspecified: Secondary | ICD-10-CM | POA: Diagnosis present

## 2013-11-03 DIAGNOSIS — I4891 Unspecified atrial fibrillation: Secondary | ICD-10-CM | POA: Diagnosis present

## 2013-11-03 DIAGNOSIS — E1149 Type 2 diabetes mellitus with other diabetic neurological complication: Secondary | ICD-10-CM | POA: Diagnosis present

## 2013-11-03 DIAGNOSIS — Z7982 Long term (current) use of aspirin: Secondary | ICD-10-CM | POA: Diagnosis not present

## 2013-11-03 DIAGNOSIS — D696 Thrombocytopenia, unspecified: Secondary | ICD-10-CM | POA: Diagnosis present

## 2013-11-03 DIAGNOSIS — E1142 Type 2 diabetes mellitus with diabetic polyneuropathy: Secondary | ICD-10-CM | POA: Diagnosis present

## 2013-11-03 DIAGNOSIS — E119 Type 2 diabetes mellitus without complications: Secondary | ICD-10-CM | POA: Diagnosis not present

## 2013-11-03 DIAGNOSIS — IMO0002 Reserved for concepts with insufficient information to code with codable children: Secondary | ICD-10-CM | POA: Diagnosis present

## 2013-11-03 DIAGNOSIS — I10 Essential (primary) hypertension: Secondary | ICD-10-CM | POA: Diagnosis not present

## 2013-11-03 DIAGNOSIS — Z8673 Personal history of transient ischemic attack (TIA), and cerebral infarction without residual deficits: Secondary | ICD-10-CM | POA: Diagnosis not present

## 2013-11-03 DIAGNOSIS — L97509 Non-pressure chronic ulcer of other part of unspecified foot with unspecified severity: Secondary | ICD-10-CM | POA: Diagnosis present

## 2013-11-03 DIAGNOSIS — I739 Peripheral vascular disease, unspecified: Secondary | ICD-10-CM | POA: Diagnosis present

## 2013-11-03 HISTORY — PX: PERICARDIAL TAP: SHX5486

## 2013-11-03 LAB — GLUCOSE, CAPILLARY
GLUCOSE-CAPILLARY: 161 mg/dL — AB (ref 70–99)
Glucose-Capillary: 136 mg/dL — ABNORMAL HIGH (ref 70–99)
Glucose-Capillary: 117 mg/dL — ABNORMAL HIGH (ref 70–99)
Glucose-Capillary: 131 mg/dL — ABNORMAL HIGH (ref 70–99)
Glucose-Capillary: 107 mg/dL — ABNORMAL HIGH (ref 70–99)

## 2013-11-03 LAB — CBC
HCT: 27.5 % — ABNORMAL LOW (ref 39.0–52.0)
Hemoglobin: 8.9 g/dL — ABNORMAL LOW (ref 13.0–17.0)
MCH: 27.8 pg (ref 26.0–34.0)
MCHC: 32.4 g/dL (ref 30.0–36.0)
MCV: 85.9 fL (ref 78.0–100.0)
Platelets: 92 10*3/uL — ABNORMAL LOW (ref 150–400)
RBC: 3.2 MIL/uL — ABNORMAL LOW (ref 4.22–5.81)
RDW: 15.6 % — AB (ref 11.5–15.5)
WBC: 4.9 10*3/uL (ref 4.0–10.5)

## 2013-11-03 LAB — PROTIME-INR
INR: 1.32 (ref 0.00–1.49)
PROTHROMBIN TIME: 16.4 s — AB (ref 11.6–15.2)

## 2013-11-03 LAB — BASIC METABOLIC PANEL
ANION GAP: 10 (ref 5–15)
BUN: 38 mg/dL — ABNORMAL HIGH (ref 6–23)
CO2: 24 mEq/L (ref 19–32)
Calcium: 8.4 mg/dL (ref 8.4–10.5)
Chloride: 106 mEq/L (ref 96–112)
Creatinine, Ser: 1.56 mg/dL — ABNORMAL HIGH (ref 0.50–1.35)
GFR calc non Af Amer: 41 mL/min — ABNORMAL LOW (ref >90)
GFR, EST AFRICAN AMERICAN: 47 mL/min — AB (ref >90)
Glucose, Bld: 161 mg/dL — ABNORMAL HIGH (ref 70–99)
Potassium: 4.2 mEq/L (ref 3.7–5.3)
Sodium: 140 mEq/L (ref 137–147)

## 2013-11-03 LAB — BODY FLUID CELL COUNT WITH DIFFERENTIAL
EOS FL: 0 %
Lymphs, Fluid: 3 %
Monocyte-Macrophage-Serous Fluid: 90 % (ref 50–90)
NEUTROPHIL FLUID: 7 % (ref 0–25)
WBC FLUID: 49 uL (ref 0–1000)

## 2013-11-03 LAB — GLUCOSE, SEROUS FLUID: GLUCOSE FL: 126 mg/dL

## 2013-11-03 SURGERY — PERICARDIAL TAP
Anesthesia: LOCAL

## 2013-11-03 MED ORDER — FENTANYL CITRATE 0.05 MG/ML IJ SOLN
INTRAMUSCULAR | Status: AC
  Filled 2013-11-03: qty 2

## 2013-11-03 MED ORDER — HEPARIN (PORCINE) IN NACL 2-0.9 UNIT/ML-% IJ SOLN
INTRAMUSCULAR | Status: AC
  Filled 2013-11-03: qty 1000

## 2013-11-03 MED ORDER — MIDAZOLAM HCL 2 MG/2ML IJ SOLN
INTRAMUSCULAR | Status: AC
  Filled 2013-11-03: qty 2

## 2013-11-03 MED ORDER — LIDOCAINE HCL (PF) 1 % IJ SOLN
INTRAMUSCULAR | Status: AC
  Filled 2013-11-03: qty 30

## 2013-11-03 MED ORDER — SODIUM CHLORIDE 0.9 % IV SOLN
INTRAVENOUS | Status: DC
  Administered 2013-11-03: 1000 mL via INTRAVENOUS
  Administered 2013-11-07: 14:00:00 via INTRAVENOUS

## 2013-11-03 NOTE — Progress Notes (Signed)
Case discussed with PA No role for urgent intervention Can fu as outpt  Mary Sella. Andrey Campanile, MD, FACS General, Bariatric, & Minimally Invasive Surgery [facility], [place]

## 2013-11-03 NOTE — Discharge Instructions (Addendum)
Hernia A hernia occurs when an internal organ pushes out through a weak spot in the abdominal wall. Hernias most commonly occur in the groin and around the navel. Hernias often can be pushed back into place (reduced). Most hernias tend to get worse over time. Some abdominal hernias can get stuck in the opening (irreducible or incarcerated hernia) and cannot be reduced. An irreducible abdominal hernia which is tightly squeezed into the opening is at risk for impaired blood supply (strangulated hernia). A strangulated hernia is a medical emergency. Because of the risk for an irreducible or strangulated hernia, surgery may be recommended to repair a hernia. CAUSES   Heavy lifting.  Prolonged coughing.  Straining to have a bowel movement.  A cut (incision) made during an abdominal surgery. HOME CARE INSTRUCTIONS   Bed rest is not required. You may continue your normal activities.  Avoid lifting more than 10 pounds (4.5 kg) or straining.  Cough gently. If you are a smoker it is best to stop. Even the best hernia repair can break down with the continual strain of coughing. Even if you do not have your hernia repaired, a cough will continue to aggravate the problem.  Do not wear anything tight over your hernia. Do not try to keep it in with an outside bandage or truss. These can damage abdominal contents if they are trapped within the hernia sac.  Eat a normal diet.  Avoid constipation. Straining over long periods of time will increase hernia size and encourage breakdown of repairs. If you cannot do this with diet alone, stool softeners may be used. SEEK IMMEDIATE MEDICAL CARE IF:   You have a fever.  You develop increasing abdominal pain.  You feel nauseous or vomit.  Your hernia is stuck outside the abdomen, looks discolored, feels hard, or is tender.  You have any changes in your bowel habits or in the hernia that are unusual for you.  You have increased pain or swelling around the  hernia.  You cannot push the hernia back in place by applying gentle pressure while lying down. MAKE SURE YOU:   Understand these instructions.  Will watch your condition.  Will get help right away if you are not doing well or get worse. Document Released: 03/20/2005 Document Revised: 06/12/2011 Document Reviewed: 11/07/2007 [facility] Patient Information 2015 [place], [place]. This information is not intended to replace advice given to you by your health care provider. Make sure you discuss any questions you have with your health care provider.  Warfarin: What You Need to Know Warfarin is an anticoagulant. Anticoagulants help prevent the formation of blood clots. They also help stop the growth of blood clots. Warfarin is sometimes referred to as a "blood thinner."  Normally, when body tissues are cut or damaged, the blood clots in order to prevent blood loss. Sometimes clots form inside your blood vessels and obstruct the flow of blood through your circulatory system (thrombosis). These clots may travel through your bloodstream and become lodged in smaller blood vessels in your brain, which can cause a stroke, or in your lungs (pulmonary embolism). WHO SHOULD USE WARFARIN? Warfarin is prescribed for people at risk of developing harmful blood clots:  People with surgically implanted mechanical heart valves, irregular heart rhythms called atrial fibrillation, and certain clotting disorders.  People who have developed harmful blood clotting in the past, including those who have had a stroke or a pulmonary embolism, or thrombosis in their legs (deep vein thrombosis [DVT]).  People with an existing  blood clot, such as a pulmonary embolism. WARFARIN DOSING Warfarin tablets come in different strengths. Each tablet strength is a different color, with the amount of warfarin (in milligrams) clearly printed on the tablet. If the color of your tablet is different than usual when you receive a new  prescription, report it immediately to your pharmacist or health care provider. WARFARIN MONITORING The goal of warfarin therapy is to lessen the clotting tendency of blood but not prevent clotting completely. Your health care provider will monitor the anticoagulation effect of warfarin closely and adjust your dose as needed. For your safety, blood tests called prothrombin time (PT) or international normalized ratio (INR) are used to measure the effects of warfarin. Both of these tests can be done with a finger stick or a blood draw. The longer it takes the blood to clot, the higher the PT or INR. Your health care provider will inform you of your "target" PT or INR range. If, at any time, your PT or INR is above the target range, there is a risk of bleeding. If your PT or INR is below the target range, there is a risk of clotting. Whether you are started on warfarin while you are in the hospital or in your health care provider's office, you will need to have your PT or INR checked within one week of starting the medicine. Initially, some people are asked to have their PT or INR checked as much as twice a week. Once you are on a stable maintenance dose, the PT or INR is checked less often, usually once every 2 to 4 weeks. The warfarin dose may be adjusted if the PT or INR is not within the target range. It is important to keep all laboratory and health care provider follow-up appointments. Not keeping appointments could result in a chronic or permanent injury, pain, or disability because warfarin is a medicine that requires close monitoring. WHAT ARE THE SIDE EFFECTS OF WARFARIN?  Too much warfarin can cause bleeding (hemorrhage) from any part of the body. This may include bleeding from the gums, blood in the urine, bloody or dark stools, a nosebleed that is not easily stopped, coughing up blood, or vomiting blood.  Too little warfarin can increase the risk of blood clots.  Too little or too much warfarin  can also increase the risk of a stroke.  Warfarin use may cause a skin rash or irritation, an unusual fever, continual nausea or stomach upset, or severe pain in your joints or back. SPECIAL PRECAUTIONS WHILE TAKING WARFARIN Warfarin should be taken exactly as directed. It is very important to take warfarin as directed since bleeding or blood clots could result in chronic or permanent injury, pain, or disability.  Take your medicine at the same time every day. If you forget to take your dose, you can take it if it is within 6 hours of when it was due.  Do not change the dose of warfarin on your own to make up for missed or extra doses.  If you miss more than 2 doses in a row, you should contact your health care provider for advice. Avoid situations that cause bleeding. You may have a tendency to bleed more easily than usual while taking warfarin. The following actions can limit bleeding:  Using a softer toothbrush.  Flossing with waxed floss rather than unwaxed floss.  Shaving with an Neurosurgeonelectric razor rather than a blade.  Limiting the use of sharp objects.  Avoiding potentially harmful activities, such  as contact sports. Warfarin and Pregnancy or Breastfeeding  Warfarin is not advised during the first trimester of pregnancy due to an increased risk of birth defects. In certain situations, a woman may take warfarin after her first trimester of pregnancy. A woman who becomes pregnant or plans to become pregnant while taking warfarin should notify her health care provider immediately.  Although warfarin does not pass into breast milk, a woman who wishes to breastfeed while taking warfarin should also consult with her health care provider. Alcohol, Smoking, and Illicit Drug Use  Alcohol affects how warfarin works in the body. It is best to avoid alcoholic drinks or consume very small amounts while taking warfarin. In general, alcohol intake should be limited to 1 oz (30 mL) of liquor, 6 oz  (180 mL) of wine, or 12 oz (360 mL) of beer each day. Notify your health care provider if you change your alcohol intake.  Smoking affects how warfarin works. It is best to avoid smoking while taking warfarin. Notify your health care provider if you change your smoking habits.  It is best to avoid all illicit drugs while taking warfarin since there are few studies that show how warfarin interacts with these drugs. Other Medicines and Dietary Supplements Many prescription and over-the-counter medicines can interfere with warfarin. Be sure all of your health care providers know you are taking warfarin. Notify your health care provider who prescribed warfarin for you or your pharmacist before starting or stopping any new medicines, including over-the-counter vitamins, dietary supplements, and pain medicines. Your warfarin dose may need to be adjusted. Some common over-the-counter medicines that may increase the risk of bleeding while taking warfarin include:   Acetaminophen.  Aspirin.  Nonsteroidal anti-inflammatory medicines (NSAIDs), such as ibuprofen or naproxen.  Vitamin E. Dietary Considerations  Foods that have moderate or high amounts of vitamin K can interfere with warfarin. Avoid major changes in your diet or notify your health care provider before changing your diet. Eat a consistent amount of foods that have moderate or high amounts of vitamin K. Eating less foods containing vitamin K can increase the risk of bleeding. Eating more foods containing vitamin K can increase the risk of blood clots. Additional questions about dietary considerations can be discussed with a dietitian. Foods that are very high in vitamin K:  Greens, such as Swiss chard and beet, collard, mustard, or turnip greens (fresh or frozen, cooked).  Kale (fresh or frozen, cooked).  Parsley (raw).  Spinach (cooked). Foods that are high in vitamin K:  Asparagus (frozen, cooked).  Beans, green (frozen,  cooked).  Broccoli.  Bok choy (cooked).  Brussels sprouts (fresh or frozen, cooked).  Cabbage (cooked).   Coleslaw. Foods that are moderately high in vitamin K:  Blueberries.  Black-eyed peas.  Endive (raw).  Green leaf lettuce (raw).  Green scallions (raw).  Kale (raw).  Okra (frozen, cooked).  Plantains (fried).  Romaine lettuce (raw).  Sauerkraut (canned).  Spinach (raw). CALL YOUR CLINIC OR HEALTH CARE PROVIDER IF YOU:  Plan to have any surgery or procedure.  Feel sick, especially if you have diarrhea or vomiting.  Experience or anticipate any major changes in your diet.  Start or stop a prescription or over-the-counter medicine.  Become, plan to become, or think you may be pregnant.  Are having heavier than usual menstrual periods.  Have had a fall, accident, or any symptoms of bleeding or unusual bruising.  Develop an unusual fever. CALL 911 IN THE [place] OR GO TO THE  EMERGENCY DEPARTMENT IF YOU:   Think you may be having an allergic reaction to warfarin. The signs of an allergic reaction could include itching, rash, hives, swelling, chest tightness, or trouble breathing.  See signs of blood in your urine. The signs could include reddish, pinkish, or tea-colored urine.  See signs of blood in your stools. The signs could include bright red or black stools.  Vomit or cough up blood. In these instances, the blood could have either a bright red or a "coffee-grounds" appearance.  Have bleeding that will not stop after applying pressure for 30 minutes such as cuts, nosebleeds, or other injuries.  Have severe pain in your joints or back.  Have a new and severe headache.  Have sudden weakness or numbness of your face, arm, or leg, especially on one side of your body.  Have sudden confusion or trouble understanding.  Have sudden trouble seeing in one or both eyes.  Have sudden trouble walking, dizziness, loss of balance, or coordination.  Have  trouble speaking or understanding (aphasia). Document Released: 03/20/2005 Document Revised: 08/04/2013 Document Reviewed: 09/13/2012 [facility] Patient Information 2015 [place], [place]. This information is not intended to replace advice given to you by your health care provider. Make sure you discuss any questions you have with your health care provider.

## 2013-11-03 NOTE — Progress Notes (Signed)
UR Completed Alexi Dorminey Graves-Bigelow, RN,BSN [phone]  

## 2013-11-03 NOTE — Progress Notes (Signed)
Pt son concerned that pt is confused and right eye drooping more than 8/2.  Pt alert to self and place, disoriented to year.  Pt states he is "forgetful"  at times as baseline.  Pupils PERRLA, grips equal, LE strength equal.  Pt reports tingling in right leg from diabetes.  Pt denies pain.  [facility] NP notified with new orders.  Will continue to monitor.

## 2013-11-03 NOTE — Progress Notes (Signed)
Pt had burst of sinus tach 140s now 70-80 NSR.  Pt asymptomatic with bp 139/57 with O2 saturation at 100% on room air.  RR on department rounding and updated on pt condition.  Awaiting transport to CT.  Will continue to monitor.

## 2013-11-03 NOTE — Progress Notes (Signed)
Patient ID: [name]  male  LXB:[id number]    DOB: 03-12-1934    DOA: 11/01/2013  PCP: Philis Fendt, MD    Assessment/Plan: Principal Problem:   Chest pain with pericardial effusion, no clinical evidence of tamponade - Patient ruled out for acute ACS, denies any specific chest pain, states "fluttering sensation"that brought him to ER. - Cardiology consulted, 2-D echo showed pericardial effusion with moderate tamponade - Patient undergo percutaneous drainage today in cath Lab - CT surgery also consulted, appreciate consult from Dr. Roxan Hockey - ESR 61  Active Problems:   Diabetes mellitus without complication - Obtain hemoglobin A1c, continue sliding scale insulin, meal coverage  Chronic left foot ulcer: Follows Dr. Sharol Given - Has Louretta Parma boot dressing, weeping, placed on doxycycline, - wound care consult, needs dressing change     Hypertension: stable  Diarrhea improved - C. difficile  Scrotal edema - Scrotal ultrasound showed bowel containing left inguinal hernia admitting bowel into the scrotal sac. Will d/w surgery.    Thrombocytopenia -Lovenox discontinued, patient still trending down    CKD (chronic kidney disease) stage 3, GFR 30-59 ml/min - gentle hydration, baseline Cr 1.4-1.6, cr at baseline  DVT Prophylaxis: SCD  Code Status: Full Code  Family Communication:  Disposition:  Consultants:  none  Procedures:  2-D echo    Antibiotics:  Doxycycline    Subjective: Patient seen and examined, no chest pain or SOB  Objective: Weight change:   Intake/Output Summary (Last 24 hours) at 11/03/13 0854 Last data filed at 11/03/13 0615  Gross per 24 hour  Intake      3 ml  Output   1602 ml  Net  -1599 ml   Blood pressure 144/53, pulse 69, temperature 97.9 F (36.6 C), temperature source Oral, resp. rate 16, height 5' 7"  (1.702 m), weight 77.111 kg (170 lb), SpO2 100.00%.  Physical Exam: General: Alert and awake, oriented x3, not in any acute  distress. CVS: S1-S2 clear, no murmur rubs or gallops Chest: CTA B. Abdomen: soft nontender, nondistended, normal bowel sounds  Extremities: no cyanosis, clubbing, dressing intact on the left lower extremity Neuro: Cranial nerves II-XII intact, no focal neurological deficits  Lab Results: Basic Metabolic Panel:  Recent Labs Lab 11/02/13 0445 11/03/13 0510  NA 135* 140  K 4.0 4.2  CL 100 106  CO2 24 24  GLUCOSE 180* 161*  BUN 42* 38*  CREATININE 1.63* 1.56*  CALCIUM 8.6 8.4  MG 2.0  --    Liver Function Tests: No results found for this basename: AST, ALT, ALKPHOS, BILITOT, PROT, ALBUMIN,  in the last 168 hours No results found for this basename: LIPASE, AMYLASE,  in the last 168 hours No results found for this basename: AMMONIA,  in the last 168 hours CBC:  Recent Labs Lab 11/02/13 0445 11/03/13 0510  WBC 7.2 4.9  HGB 9.4* 8.9*  HCT 28.5* 27.5*  MCV 86.6 85.9  PLT PLATELET CLUMPS NOTED ON SMEAR, COUNT APPEARS DECREASED 92*   Cardiac Enzymes:  Recent Labs Lab 11/02/13 1005 11/02/13 1504 11/02/13 2059  TROPONINI <0.30 <0.30 <0.30   BNP: No components found with this basename: POCBNP,  CBG:  Recent Labs Lab 11/02/13 1142 11/02/13 1250 11/02/13 1631 11/02/13 2053 11/03/13 0725  GLUCAP 58* 90 107* 149* 136*     Micro Results: Recent Results (from the past 240 hour(s))  CLOSTRIDIUM DIFFICILE BY PCR     Status: None   Collection Time    11/02/13  9:17 AM  Result Value Ref Range Status   C difficile by pcr NEGATIVE  NEGATIVE Final    Studies/Results: Dg Chest Port 1 View  11/01/2013   CLINICAL DATA:  Chest pain and palpitations.  EXAM: PORTABLE CHEST - 1 VIEW  COMPARISON:  None.  FINDINGS: Shallow inspiration. Cardiac enlargement. Pulmonary vascularity is normal. No consolidation or edema. No blunting of costophrenic angles. No pneumothorax. Calcification of aorta. Degenerative changes in the shoulders.  IMPRESSION: Cardiac enlargement.  No  evidence of active pulmonary disease.   Electronically Signed   By: Lucienne Capers M.D.   On: 11/01/2013 22:45    Medications: Scheduled Meds: . aspirin EC  325 mg Oral Daily  . doxycycline  100 mg Oral Q12H  . ferrous sulfate  325 mg Oral TID WC  . insulin aspart  0-5 Units Subcutaneous QHS  . insulin aspart  0-9 Units Subcutaneous TID WC  . simvastatin  10 mg Oral Q lunch  . sodium chloride  3 mL Intravenous Q12H      LOS: 2 days   Octavian Godek M.D. [facility] 11/03/2013, 8:54 AM Pager: [phone]  If 7PM-7AM, please contact night-coverage [email or web address] Password [email or web address]  **Disclaimer: This note was dictated with voice recognition software. Similar sounding words can inadvertently be transcribed and this note may contain transcription errors which may not have been corrected upon publication of note.**

## 2013-11-03 NOTE — Progress Notes (Signed)
Patient ID: [name], male   DOB: 1934/03/03, 80 y.o.   MRN: [id number] We are asked to see this patient for findings of a scrotal hernia on ultrasound. The patient is here for pericardial effusion. His scrotum is not bothering him. He has noted it to be swollen the last 2 years. It has never bothered him. He has no pain. He is eating and moving his bowels well. His hernia is soft and partially reducible but rather large in size. No surgical intervention is needed urgently. I have encouraged the patient to follow up with [place] in outpatient to discuss elective repair. The patient understands along with his granddaughter and is agreeable to followup on an as-needed basis. Please call as with any other concerns.  Jidenna Figgs E 3:08 PM 11/03/2013

## 2013-11-03 NOTE — Interval H&P Note (Signed)
History and Physical Interval Note:  11/03/2013 11:25 AM  [name]  has presented today for surgery, with the diagnosis of pericardial effusion  The various methods of treatment have been discussed with the patient and family. After consideration of risks, benefits and other options for treatment, the patient has consented to  Procedure(s): PERICARDIAL TAP (N/A) as a surgical intervention .  The patient's history has been reviewed, patient examined, no change in status, stable for surgery.  I have reviewed the patient's chart and labs.  Questions were answered to the patient's satisfaction.     Theron Arista [facility] 11/03/2013 11:25 AM

## 2013-11-03 NOTE — H&P (View-Only) (Signed)
Subjective:  No chest pain or significant dyspnea.  Objective:   Vital Signs in the last 24 hours: Temp:  [97.3 F (36.3 C)-98.5 F (36.9 C)] 97.9 F (36.6 C) (08/03 0757) Pulse Rate:  [66-74] 69 (08/03 0757) Resp:  [16-18] 16 (08/03 0757) BP: (128-158)/(53-69) 144/53 mmHg (08/03 0757) SpO2:  [98 %-100 %] 100 % (08/03 0757)  Intake/Output from previous day: 08/02 0701 - 08/03 0700 In: 243 [P.O.:240; I.V.:3] Out: 1603 [Urine:1600; Stool:3]  Medications: . aspirin EC  325 mg Oral Daily  . doxycycline  100 mg Oral Q12H  . ferrous sulfate  325 mg Oral TID WC  . insulin aspart  0-5 Units Subcutaneous QHS  . insulin aspart  0-9 Units Subcutaneous TID WC  . simvastatin  10 mg Oral Q lunch  . sodium chloride  3 mL Intravenous Q12H       Physical Exam:   General appearance: alert, cooperative and no distress Neck: no adenopathy, no carotid bruit, supple, symmetrical, trachea midline and thyroid not enlarged, symmetric, no tenderness/mass/nodules Lungs: decreased BS no wheezing Heart: regular rate and rhythm; no definitive rub ? Rare intermittent Abd: bowel sounds normal; no masses,  no organomegaly Extremities: no edema, redness or tenderness in the calves or thighs Skin: Left foot in cast Neuro: alert and orinted   Rate: 96  Rhythm: normal sinus rhythm  Lab Results:   Recent Labs  11/02/13 0445 11/03/13 0510  NA 135* 140  K 4.0 4.2  CL 100 106  CO2 24 24  GLUCOSE 180* 161*  BUN 42* 38*  CREATININE 1.63* 1.56*     Recent Labs  11/02/13 1504 11/02/13 2059  TROPONINI <0.30 <0.30    Hepatic Function Panel No results found for this basename: PROT, ALBUMIN, AST, ALT, ALKPHOS, BILITOT, BILIDIR, IBILI,  in the last 72 hours  Recent Labs  11/03/13 0615  INR 1.32   BNP (last 3 results) No results found for this basename: PROBNP,  in the last 8760 hours  Lipid Panel     Component Value Date/Time   CHOL 83 10/01/2013 0500   TRIG 58 10/01/2013 0500   HDL 29* 10/01/2013 0500   CHOLHDL 2.9 10/01/2013 0500   VLDL 12 10/01/2013 0500   LDLCALC 42 10/01/2013 0500      Imaging:  [place] Scrotum  11/02/2013   CLINICAL DATA:  Scrotal edema, swelling  EXAM: ULTRASOUND OF SCROTUM  TECHNIQUE: Complete ultrasound examination of the testicles, epididymis, and other scrotal structures was performed.  COMPARISON:  None.  FINDINGS: Right testicle  Measurements: 4.4 x 2.5 x 2.4 cm. No mass or microlithiasis visualized.  Left testicle  Measurements: 4.2 x 2.5 x 2.4 cm. No mass or microlithiasis visualized.  Right epididymis:  Normal in size and appearance.  Left epididymis:  Normal in size and appearance.  Hydrocele:  Small left hydrocele.  Varicocele:  No varicocele identified.  Loops of bowel were noted herniating into the left scrotal sac with peristalsis evident at real time imaging.  IMPRESSION: Bowel containing left inguinal hernia admitting bowel into the scrotal sac.  No intratesticular mass or other testicular abnormality.   Electronically Signed   By: Christiana PellantGretchen  Green M.D.   On: 11/02/2013 18:28   Dg Chest Port 1 View  11/01/2013   CLINICAL DATA:  Chest pain and palpitations.  EXAM: PORTABLE CHEST - 1 VIEW  COMPARISON:  None.  FINDINGS: Shallow inspiration. Cardiac enlargement. Pulmonary vascularity is normal. No consolidation or edema. No blunting of costophrenic angles. No  pneumothorax. Calcification of aorta. Degenerative changes in the shoulders.  IMPRESSION: Cardiac enlargement.  No evidence of active pulmonary disease.   Electronically Signed   By: Burman Nieves M.D.   On: 11/01/2013 22:45      Assessment/Plan:   Principal Problem:   Chest pain Active Problems:   Pericardial effusion   Diabetes mellitus without complication   Hypertension   CKD (chronic kidney disease) stage 3, GFR 30-59 ml/min   Anemia of chronic disease   PAD (peripheral artery disease)   Ulcer of foot, chronic   Thrombocytopenia   Diarrhea   Scrotal edema  Discussed with  Dr. Donnie Aho. Plan for pericardiocentesis today before noon for elective drainage of large pericardial effusion. Discussed procedure with patient. Appreciate Dr. Sunday Corn evaluation.    Lennette Bihari, MD, [facility] 11/03/2013, 8:43 AM

## 2013-11-03 NOTE — CV Procedure (Signed)
   Procedure: Percutaneous pericardiocentesis.  Indication: 80 yo WM with large pericardial effusion with Echo evidence of tamponade.  Preparation: After consent obtained, the subxiphoid area was prepped and draped in a sterile fashion. Echo was used to assess pericardial effusion prior to tap.  Procedure: The patient was sedated. 1% Lidocaine was used to anesthetize the subxiphoid space. At this point a pericardial needle was advanced into the pericardial space with return of clear yellow fluid. Needle position was confirmed by Echo with injection of agitated saline. A J wire was advanced into the pericardial space under fluoro guidance. A dilator was then used to prepare the tract. Despite this I was unable to advance the pericardial drain into the pericardial space. I was also unable to pass a pigtail catheter. I placed the dilator into the pericardial space and aspiration of the effusion was performed with removal of approximately 750 ml of fluid. Echo demonstrated complete resolution of the pericardial effusion and the dilator was removed. There was no complication and the patient was hemodynamically stable. Fluid was sent for analysis including cytology, cultures, cell count, and LDH. The patient was transported to the recovery area for further monitoring.   Impression: Successful percutaneous pericardiocentesis.   Peter Swaziland MD, [facility]  11/03/2013 12:41 PM

## 2013-11-03 NOTE — Progress Notes (Signed)
Echocardiogram 2D Echocardiogram has been performed.  Dorothey Baseman 11/03/2013, 12:48 PM

## 2013-11-03 NOTE — Consult Note (Addendum)
WOC wound consult note Reason for Consult: Compression wrap to left leg; Pt is followed by Dr Lajoyce Corners as outpatient prior to admission and Profore wraps are changed Q week. Wound type:full thickness Measurement:left plantar foot:0.2cmx.0.2cmx0.2cm; left heel: 0.1cmx0.1cmx0.1cm Wound GHW:EXHB foot:100% pink; left heel: able to palpate bone with swab to both sites; pt states Dr Lajoyce Corners removed a pin. Drainage: copious purulent serosanguinous drainage, with strong odor, soaked through 4 layers Periwound: skin intact with calloused margins  Dressing procedure/placement/frequency:  Pt tolerated the procedure fairly well. Foam dressing applied to wounds to absorb drainage, then 4 layer Profore wraps applied.  Pt can resume follow-up with Dr Lajoyce Corners after discharge or [facility] nurse will plan to change compression wraps next Monday if still in the hospital at that time. Charlesetta Garibaldi, RN, BSN, MSN- Student Please re-consult if further assistance is needed.  Thank-you,  Cammie Mcgee MSN, RN, CWOCN, [place], CNS [phone]

## 2013-11-03 NOTE — Progress Notes (Signed)
 Subjective:  No chest pain or significant dyspnea.  Objective:   Vital Signs in the last 24 hours: Temp:  [97.3 F (36.3 C)-98.5 F (36.9 C)] 97.9 F (36.6 C) (08/03 0757) Pulse Rate:  [66-74] 69 (08/03 0757) Resp:  [16-18] 16 (08/03 0757) BP: (128-158)/(53-69) 144/53 mmHg (08/03 0757) SpO2:  [98 %-100 %] 100 % (08/03 0757)  Intake/Output from previous day: 08/02 0701 - 08/03 0700 In: 243 [P.O.:240; I.V.:3] Out: 1603 [Urine:1600; Stool:3]  Medications: . aspirin EC  325 mg Oral Daily  . doxycycline  100 mg Oral Q12H  . ferrous sulfate  325 mg Oral TID WC  . insulin aspart  0-5 Units Subcutaneous QHS  . insulin aspart  0-9 Units Subcutaneous TID WC  . simvastatin  10 mg Oral Q lunch  . sodium chloride  3 mL Intravenous Q12H       Physical Exam:   General appearance: alert, cooperative and no distress Neck: no adenopathy, no carotid bruit, supple, symmetrical, trachea midline and thyroid not enlarged, symmetric, no tenderness/mass/nodules Lungs: decreased BS no wheezing Heart: regular rate and rhythm; no definitive rub ? Rare intermittent Abd: bowel sounds normal; no masses,  no organomegaly Extremities: no edema, redness or tenderness in the calves or thighs Skin: Left foot in cast Neuro: alert and orinted   Rate: 96  Rhythm: normal sinus rhythm  Lab Results:   Recent Labs  11/02/13 0445 11/03/13 0510  NA 135* 140  K 4.0 4.2  CL 100 106  CO2 24 24  GLUCOSE 180* 161*  BUN 42* 38*  CREATININE 1.63* 1.56*     Recent Labs  11/02/13 1504 11/02/13 2059  TROPONINI <0.30 <0.30    Hepatic Function Panel No results found for this basename: PROT, ALBUMIN, AST, ALT, ALKPHOS, BILITOT, BILIDIR, IBILI,  in the last 72 hours  Recent Labs  11/03/13 0615  INR 1.32   BNP (last 3 results) No results found for this basename: PROBNP,  in the last 8760 hours  Lipid Panel     Component Value Date/Time   CHOL 83 10/01/2013 0500   TRIG 58 10/01/2013 0500   HDL 29* 10/01/2013 0500   CHOLHDL 2.9 10/01/2013 0500   VLDL 12 10/01/2013 0500   LDLCALC 42 10/01/2013 0500      Imaging:  Us Scrotum  11/02/2013   CLINICAL DATA:  Scrotal edema, swelling  EXAM: ULTRASOUND OF SCROTUM  TECHNIQUE: Complete ultrasound examination of the testicles, epididymis, and other scrotal structures was performed.  COMPARISON:  None.  FINDINGS: Right testicle  Measurements: 4.4 x 2.5 x 2.4 cm. No mass or microlithiasis visualized.  Left testicle  Measurements: 4.2 x 2.5 x 2.4 cm. No mass or microlithiasis visualized.  Right epididymis:  Normal in size and appearance.  Left epididymis:  Normal in size and appearance.  Hydrocele:  Small left hydrocele.  Varicocele:  No varicocele identified.  Loops of bowel were noted herniating into the left scrotal sac with peristalsis evident at real time imaging.  IMPRESSION: Bowel containing left inguinal hernia admitting bowel into the scrotal sac.  No intratesticular mass or other testicular abnormality.   Electronically Signed   By: Gretchen  Green M.D.   On: 11/02/2013 18:28   Dg Chest Port 1 View  11/01/2013   CLINICAL DATA:  Chest pain and palpitations.  EXAM: PORTABLE CHEST - 1 VIEW  COMPARISON:  None.  FINDINGS: Shallow inspiration. Cardiac enlargement. Pulmonary vascularity is normal. No consolidation or edema. No blunting of costophrenic angles. No   pneumothorax. Calcification of aorta. Degenerative changes in the shoulders.  IMPRESSION: Cardiac enlargement.  No evidence of active pulmonary disease.   Electronically Signed   By: Burman Nieves M.D.   On: 11/01/2013 22:45      Assessment/Plan:   Principal Problem:   Chest pain Active Problems:   Pericardial effusion   Diabetes mellitus without complication   Hypertension   CKD (chronic kidney disease) stage 3, GFR 30-59 ml/min   Anemia of chronic disease   PAD (peripheral artery disease)   Ulcer of foot, chronic   Thrombocytopenia   Diarrhea   Scrotal edema  Discussed with  Dr. Donnie Aho. Plan for pericardiocentesis today before noon for elective drainage of large pericardial effusion. Discussed procedure with patient. Appreciate Dr. Sunday Corn evaluation.    Lennette Bihari, MD, [facility] 11/03/2013, 8:43 AM

## 2013-11-04 ENCOUNTER — Inpatient Hospital Stay (HOSPITAL_COMMUNITY): Payer: Medicare Other

## 2013-11-04 ENCOUNTER — Encounter (HOSPITAL_COMMUNITY): Payer: Self-pay | Admitting: Physician Assistant

## 2013-11-04 DIAGNOSIS — D696 Thrombocytopenia, unspecified: Secondary | ICD-10-CM

## 2013-11-04 DIAGNOSIS — N5089 Other specified disorders of the male genital organs: Secondary | ICD-10-CM

## 2013-11-04 DIAGNOSIS — I319 Disease of pericardium, unspecified: Secondary | ICD-10-CM

## 2013-11-04 LAB — GLUCOSE, CAPILLARY
GLUCOSE-CAPILLARY: 135 mg/dL — AB (ref 70–99)
GLUCOSE-CAPILLARY: 185 mg/dL — AB (ref 70–99)
Glucose-Capillary: 182 mg/dL — ABNORMAL HIGH (ref 70–99)
Glucose-Capillary: 164 mg/dL — ABNORMAL HIGH (ref 70–99)

## 2013-11-04 LAB — CREATININE, FLUID (PLEURAL, PERITONEAL, JP DRAINAGE): CREAT FL: 1.4 mg/dL

## 2013-11-04 LAB — LACTATE DEHYDROGENASE, PLEURAL OR PERITONEAL FLUID: LD FL: 170 U/L — AB (ref 3–23)

## 2013-11-04 LAB — PROTEIN, PERICARDIAL FLUID: PROTEIN, PERICARDIAL FLUID: 4.7 g/dL

## 2013-11-04 LAB — MISCELLANEOUS TEST

## 2013-11-04 NOTE — Care Management Note (Unsigned)
    Page 1 of 2   11/13/2013     5:04:58 PM CARE MANAGEMENT NOTE 11/13/2013  Patient:  [name], [name]   Account Number:  [id number]  Date Initiated:  11/04/2013  Documentation initiated by:  GRAVES-BIGELOW,BRENDA  Subjective/Objective Assessment:   Pt admitted for cp- pericardial effusion.     Action/Plan:   CM will continue to monitor for disposition needs.   Anticipated DC Date:  11/13/2013   Anticipated DC Plan:  IP REHAB FACILITY  In-house referral  Clinical Social Worker      DC Planning Services  CM consult      Choice offered to / List presented to:             Status of service:  Completed, signed off Medicare Important Message given?  YES (If response is "NO", the following Medicare IM given date fields will be blank) Date Medicare IM given:  11/04/2013 Medicare IM given by:  GRAVES-BIGELOW,BRENDA Date Additional Medicare IM given:  11/13/2013 Additional Medicare IM given by:  Jaramiah Bossard  Discharge Disposition:  IP REHAB FACILITY  Per UR Regulation:  Reviewed for med. necessity/level of care/duration of stay  If discussed at Long Length of Stay Meetings, dates discussed:   11/06/2013    Comments:  Contact: Tammer, Nettles Son [phone]                Boxell,Shirley Spouse [phone]  11/13/13 Sidney Ace, RN, BSN [phone] dc to [facility] IP rehab today.  11/12/13 Sidney Ace, RN, BSN [phone] Rehab c/s in progress.  Hopeful for dc to IP rehab on 8/13, pending medical stability.  11/11/13 Sidney Ace, RN, BSN [phone] PT recommending ST-SNF at dc for rehab; CSW consulted to facilitate poss dc to SNF when medically stable for dc. Will follow progress.  11-09-13 Dana Allan, RNBSN [phone] Post op pericardial effusion on 8-7  11/05/13- 1400- Donn Pierini RN, BSN [phone] Pt had afib this am and now on cardizem gtt- plan is to recheck echo in am to f/u on pericardial effusion- ? need for pericardial window.

## 2013-11-04 NOTE — Progress Notes (Signed)
Patient Name: Barry LawmanRobert White Date of Encounter: 11/04/2013  Principal Problem:   Chest pain Active Problems:   Diabetes mellitus without complication   Hypertension   CKD (chronic kidney disease) stage 3, GFR 30-59 ml/min   Anemia of chronic disease   PAD (peripheral artery disease)   Pericardial effusion   Ulcer of foot, chronic   Thrombocytopenia   Diarrhea   Scrotal edema    Patient Profile: 80 yo male w/ hx peric effusion, CVA, DM, HTN, CKD, was admitted 08/02 with chest pain. Cards saw, effusion now w/ tamponade, 750 cc drained 08/03.   SUBJECTIVE: Breathing better, no chest pain, no palpitations.  OBJECTIVE Filed Vitals:   11/03/13 2201 11/04/13 0036 11/04/13 0400 11/04/13 0731  BP: 139/57 141/59 129/50 131/66  Pulse: 78 80 74 73  Temp:   98.8 F (37.1 C) 98.3 F (36.8 C)  TempSrc:   Oral Oral  Resp:  16 18 18   Height:      Weight:   172 lb 6.4 oz (78.2 kg)   SpO2: 100% 100% 97% 98%    Intake/Output Summary (Last 24 hours) at 11/04/13 [id number] Last data filed at 11/04/13 [id number]  Gross per 24 hour  Intake  345.5 ml  Output    400 ml  Net  -54.5 ml   Filed Weights   11/01/13 2204 11/04/13 0400  Weight: 170 lb (77.111 kg) 172 lb 6.4 oz (78.2 kg)    PHYSICAL EXAM General: Well developed, well nourished, male in no acute distress. Head: Normocephalic, atraumatic.  Neck: Supple without bruits, JVD. Lungs:  Resp regular and unlabored, CTA. Heart: RRR, S1, S2, no S3, S4, or murmur; no rub. Abdomen: Soft, non-tender, non-distended, BS + x 4.  Extremities: No clubbing, cyanosis, edema.  Neuro: Alert and oriented X 3. Moves all extremities spontaneously. Psych: Normal affect.  LABS: CBC: Recent Labs  11/02/13 0445 11/03/13 0510  WBC 7.2 4.9  HGB 9.4* 8.9*  HCT 28.5* 27.5*  MCV 86.6 85.9  PLT PLATELET CLUMPS NOTED ON SMEAR, COUNT APPEARS DECREASED 92*   INR: Recent Labs  11/03/13 0615  INR 1.32   Basic Metabolic Panel: Recent Labs   54/12/8106/02/15 0445 11/03/13 0510  NA 135* 140  K 4.0 4.2  CL 100 106  CO2 24 24  GLUCOSE 180* 161*  BUN 42* 38*  CREATININE 1.63* 1.56*  CALCIUM 8.6 8.4  MG 2.0  --    Cardiac Enzymes: Recent Labs  11/02/13 1005 11/02/13 1504 11/02/13 2059  TROPONINI <0.30 <0.30 <0.30    Recent Labs  11/01/13 2227  TROPIPOC 0.04   Hemoglobin A1C: Recent Labs  11/02/13 0908  HGBA1C 5.9*    TELE:  SR, episode ST vs atrial tach, brief, no sx.    Radiology/Studies: Ct Head Wo Contrast 11/04/2013   CLINICAL DATA:  Mental status change  EXAM: CT HEAD WITHOUT CONTRAST  TECHNIQUE: Contiguous axial images were obtained from the base of the skull through the vertex without intravenous contrast.  COMPARISON:  CT 09/24/2013  FINDINGS: Advanced atrophy and advanced chronic microvascular ischemic changes. Small chronic infarct left occipital lobe  Negative for acute infarct. Negative for hemorrhage or mass. No change from the prior study.  IMPRESSION: Advanced atrophy and advanced chronic microvascular ischemia. No acute abnormality.   Electronically Signed   By: Marlan Palauharles  Clark M.D.   On: 11/04/2013 02:34   [place] Scrotum 11/02/2013   CLINICAL DATA:  Scrotal edema, swelling  EXAM: ULTRASOUND OF SCROTUM  TECHNIQUE: Complete  ultrasound examination of the testicles, epididymis, and other scrotal structures was performed.  COMPARISON:  None.  FINDINGS: Right testicle  Measurements: 4.4 x 2.5 x 2.4 cm. No mass or microlithiasis visualized.  Left testicle  Measurements: 4.2 x 2.5 x 2.4 cm. No mass or microlithiasis visualized.  Right epididymis:  Normal in size and appearance.  Left epididymis:  Normal in size and appearance.  Hydrocele:  Small left hydrocele.  Varicocele:  No varicocele identified.  Loops of bowel were noted herniating into the left scrotal sac with peristalsis evident at real time imaging.  IMPRESSION: Bowel containing left inguinal hernia admitting bowel into the scrotal sac.  No intratesticular mass or  other testicular abnormality.   Electronically Signed   By: Christiana Pellant M.D.   On: 11/02/2013 18:28     Current Medications:  . aspirin EC  325 mg Oral Daily  . doxycycline  100 mg Oral Q12H  . ferrous sulfate  325 mg Oral TID WC  . insulin aspart  0-5 Units Subcutaneous QHS  . insulin aspart  0-9 Units Subcutaneous TID WC  . simvastatin  10 mg Oral Q lunch  . sodium chloride  3 mL Intravenous Q12H   . sodium chloride 1,000 mL (11/03/13 1327)    ASSESSMENT AND PLAN: Principal Problem:   Chest pain - improved, ez negative for MI, felt secondary to peric effusion   Pericardial effusion - 750 cc drained 08/03, f/u in office arranged. Limited recheck echo pending  Active Problems:   Diabetes mellitus without complication - per IM   Hypertension - per IM   CKD (chronic kidney disease) stage 3, GFR 30-59 ml/min - per IM   Anemia of chronic disease - per IM   PAD (peripheral artery disease) - per IM   Ulcer of foot, chronic- per IM   Thrombocytopenia- per IM   Diarrhea- per IM   Scrotal edema- per IM, has hernia, f/u with [facility] recommended.   SignedTheodore Demark , PA-C 8:23 AM 11/04/2013  Patient seen and examined. Agree with assessment and plan. Breathing better; s/p pericardiocentesis of 750 cc fluid. Fluid was sent for analysis including cytology, cultures, cell count, and LDH. LDH 170, glu 126, protein 4.7, creat 1.4. For limited echo today to make certain no re-accumulation.    Lennette Bihari, MD, [facility] 11/04/2013 8:50 AM

## 2013-11-04 NOTE — Progress Notes (Signed)
  Echocardiogram 2D Echocardiogram (Limited) has been performed.  Arvil Chaco 11/04/2013, 2:12 PM

## 2013-11-04 NOTE — Clinical Documentation Improvement (Deleted)
Pt with abnormal BUN/CR/GR (see below) 43/1.19/43  Please clarify underlying diagnosis responsible for abnormal lab values and document in pn or d/c summary    Possible Clinical Conditions? ____________________  ____________________ ____________________ _______Other Condition__________________ _______Cannot Clinically Determine   Supporting Information: Risk Factors: Signs & Symptoms: Diagnostics: Component      BUN Creatinine  Latest Ref Rng      6 - 23 mg/dL 1.61 - 0.96 mg/dL  0/07/5407      43 (H) 8.11 (H)   Component      GFR calc non Af Amer  Latest Ref Rng      >90 mL/min  11/01/2013      43 (L)   Component      BUN Creatinine  Latest Ref Rng      6 - 23 mg/dL 9.14 - 7.82 mg/dL  12/07/6211     0:86 AM 42 (H) 1.63 (H)   Component      GFR calc non Af Amer  Latest Ref Rng      >90 mL/min  11/02/2013     4:45 AM 39 (L)   Component      BUN Creatinine  Latest Ref Rng      6 - 23 mg/dL 5.78 - 4.69 mg/dL  09/03/9526     4:13 AM 38 (H) 1.56 (H)   Component      GFR calc non Af Amer  Latest Ref Rng      >90 mL/min  11/03/2013     5:10 AM 41 (L)    Treatment 0.9 %  sodium chloride infusion   Monitoring    Thank You, Enis Slipper ,RN Clinical Documentation Specialist:  [phone]  [facility]- Health Information Management

## 2013-11-04 NOTE — Progress Notes (Addendum)
Patient ID: [name]  male  UEA:[id number]    DOB: [birth year]    DOA: 11/01/2013  PCP: Philis Fendt, MD    Assessment/Plan: Principal Problem:   Chest pain with pericardial effusion, no clinical evidence of tamponade: Patient was ruled out for acute ACS, denied any specific chest pain, states "fluttering sensation"that brought him to ER. - Cardiology was consulted, 2-D echo showed pericardial effusion with moderate tamponade - Status post pericardiocentesis on 8/3, studies for her pericardial fluid sent, follow studies.  - CT surgery also consulted, appreciate consult from Dr. Roxan Hockey - ESR 61 - Repeat 2-D echo today  Active Problems:   Diabetes mellitus uncontrolled complicated with diabetic neuropathy, chronic left foot ulcer  - Hemoglobin A1c 5.9, continue sliding scale insulin  Chronic diabetic left foot ulcer: Follows Dr. Sharol Given - Has Louretta Parma boot dressing, weeping, hence placed on doxycycline, - wound care consult, dressing change done on 8/3     Hypertension: stable  Diarrhea improved - C. Difficile negative  Scrotal edema with inguinal hernia - Scrotal ultrasound showed bowel containing left inguinal hernia admitting bowel into the scrotal sac.  - Surgery was consulted and recommended outpatient elective repair  Thrombocytopenia -Lovenox discontinued, patient still trending down    CKD (chronic kidney disease) stage 3, GFR 30-59 ml/min - gentle hydration, baseline Cr 1.4-1.6, cr at baseline  Anemia: Appears to be chronic, no precipitous drop, likely decrease from hemodilution  DVT Prophylaxis: SCD  Code Status: Full Code  Family Communication: called and discussed with his wife on phone. Discussed with patient's son in detail at the bedside  Disposition:  Consultants:  none  Procedures:  2-D echo  Pericardiocentesis  Antibiotics:  Doxycycline    Subjective: Patient seen and examined, no chest pain or SOB  Objective: Weight change:    Intake/Output Summary (Last 24 hours) at 11/04/13 1034 Last data filed at 11/04/13 0508  Gross per 24 hour  Intake  345.5 ml  Output    400 ml  Net  -54.5 ml   Blood pressure 131/66, pulse 73, temperature 98.3 F (36.8 C), temperature source Oral, resp. rate 18, height 5' 7"  (1.702 m), weight 78.2 kg (172 lb 6.4 oz), SpO2 98.00%.  Physical Exam: General: Alert and awake, oriented x3, NAD. CVS: S1-S2 clear, no murmur rubs or gallops Chest: CTA B. Abdomen: soft nontender, nondistended, normal bowel sounds  Extremities: no cyanosis, clubbing, dressing intact on the left lower extremity   Lab Results: Basic Metabolic Panel:  Recent Labs Lab 11/02/13 0445 11/03/13 0510  NA 135* 140  K 4.0 4.2  CL 100 106  CO2 24 24  GLUCOSE 180* 161*  BUN 42* 38*  CREATININE 1.63* 1.56*  CALCIUM 8.6 8.4  MG 2.0  --    Liver Function Tests: No results found for this basename: AST, ALT, ALKPHOS, BILITOT, PROT, ALBUMIN,  in the last 168 hours No results found for this basename: LIPASE, AMYLASE,  in the last 168 hours No results found for this basename: AMMONIA,  in the last 168 hours CBC:  Recent Labs Lab 11/02/13 0445 11/03/13 0510  WBC 7.2 4.9  HGB 9.4* 8.9*  HCT 28.5* 27.5*  MCV 86.6 85.9  PLT PLATELET CLUMPS NOTED ON SMEAR, COUNT APPEARS DECREASED 92*   Cardiac Enzymes:  Recent Labs Lab 11/02/13 1005 11/02/13 1504 11/02/13 2059  TROPONINI <0.30 <0.30 <0.30   BNP: No components found with this basename: POCBNP,  CBG:  Recent Labs Lab 11/03/13 1307 11/03/13 1710 11/03/13 1929 11/03/13  2038 11/04/13 0733  GLUCAP 117* 131* 107* 161* 135*     Micro Results: Recent Results (from the past 240 hour(s))  CLOSTRIDIUM DIFFICILE BY PCR     Status: None   Collection Time    11/02/13  9:17 AM      Result Value Ref Range Status   C difficile by pcr NEGATIVE  NEGATIVE Final  BODY FLUID CULTURE     Status: None   Collection Time    11/03/13 11:50 AM      Result  Value Ref Range Status   Specimen Description FLUID PERICARDIAL   Final   Special Requests NONE   Final   Gram Stain     Final   Value: RARE WBC PRESENT, PREDOMINANTLY MONONUCLEAR     NO ORGANISMS SEEN     Performed at [facility]   Culture PENDING   Incomplete   Report Status PENDING   Incomplete    Studies/Results: Dg Chest Port 1 View  11/01/2013   CLINICAL DATA:  Chest pain and palpitations.  EXAM: PORTABLE CHEST - 1 VIEW  COMPARISON:  None.  FINDINGS: Shallow inspiration. Cardiac enlargement. Pulmonary vascularity is normal. No consolidation or edema. No blunting of costophrenic angles. No pneumothorax. Calcification of aorta. Degenerative changes in the shoulders.  IMPRESSION: Cardiac enlargement.  No evidence of active pulmonary disease.   Electronically Signed   By: Lucienne Capers M.D.   On: 11/01/2013 22:45    Medications: Scheduled Meds: . aspirin EC  325 mg Oral Daily  . doxycycline  100 mg Oral Q12H  . ferrous sulfate  325 mg Oral TID WC  . insulin aspart  0-5 Units Subcutaneous QHS  . insulin aspart  0-9 Units Subcutaneous TID WC  . simvastatin  10 mg Oral Q lunch  . sodium chloride  3 mL Intravenous Q12H      LOS: 3 days   RAI,RIPUDEEP M.D. [facility] 11/04/2013, 10:34 AM Pager: [phone]  If 7PM-7AM, please contact night-coverage [email or web address] Password [email or web address]  **Disclaimer: This note was dictated with voice recognition software. Similar sounding words can inadvertently be transcribed and this note may contain transcription errors which may not have been corrected upon publication of note.**

## 2013-11-04 NOTE — Clinical Documentation Improvement (Signed)
Please clarify if anemia, and thrombocytopenia in setting of abnormal H/H=8.9/27.5 can be further specified as one of the diagnoses listed below and document in pn or d/c summary.   Possible Clinical Conditions?  Acute Blood Loss Anemia Aplastic anemia Precipitous drop in Hematocrit Acute on chronic blood loss anemia  Other Condition  Cannot Clinically Determine   Supporting Information: Pericardial Effusion Tamponade DM Signs and Symptoms: Diagnostics: Component      Hemoglobin HCT  Latest Ref Rng      13.0 - 17.0 g/dL 59.9 - 77.4 %  04/06/2393      10.4 (L) 31.1 (L)   Component      Hemoglobin HCT  Latest Ref Rng      13.0 - 17.0 g/dL 32.0 - 23.3 %  07/04/5684     4:45 AM 9.4 (L) 28.5 (L)   Component      Hemoglobin HCT  Latest Ref Rng      13.0 - 17.0 g/dL 16.8 - 37.2 %  9/0/2111     5:10 AM 8.9 (L) 27.5 (L)   Treatments:    ferrous sulfate tablet 325 mg  Thank You, Enis Slipper ,RN Clinical Documentation Specialist:  [phone]  [facility]- Health Information Management

## 2013-11-04 NOTE — Clinical Documentation Improvement (Signed)
Please clarify if there is a link between the following:  IDDM and foot ulcer per MD note 11/03/13  IDDM and Pt reports tingling in right leg from diabetes (please clarify underlying diagnoses) per Nurse note 11/03/13  Possible Clinical Conditions?   _______Diabetes Type   or 2 _______Controlled or uncontrolled  Manifestations:  _______DM retinopathy  _______DM PVD _______DM neuropathy   _______DM nephropathy  Associated conditions: _______DM cellulitis _______DM gangrene _______DM gastroparesis _______DM osteomyelitis _______DM skin ulcer  _______Other Condition _______Cannot Clinically determine   Supporting Information: Risk Factors: Pericardial Effusion/tamponade  Signs & Symptoms: Chronic left foot ulcer: Follows Dr. Lajoyce Corners  - Has Roland Rack boot dressing, weeping, placed on doxycycline,  - wound care consult, needs dressing change per pn 11/03/13 Diagnostics: Treatment: Monitoring  Thank You, Enis Slipper ,RN Clinical Documentation Specialist:  [phone]  [facility]- Health Information Management

## 2013-11-04 NOTE — Evaluation (Signed)
Physical Therapy Evaluation Patient Details Name: [name] Mcguinness MRN: [id number] DOB: 10/03/1933 Today's Date: 11/04/2013   History of Present Illness  [name] Perrell is a 80 y.o. male with a history of a Previous CVA without Residual Deficits,  HTN, DM2, and Stage III CKD who presents to the ED with complaints of intermittent Chest Pain and palpitations since 5 pm.    He denies having any SOB, Nausea, Vomiting or Diaphoresis associated with the pain.   He was evaluated in the ED by [facility] and referred for a cardiac rule-out.   On his last admission to the hospital he was found to have a cardiac Tamponade, and tonight he was found to have no significant pericardial effusion by cardiology.    Clinical Impression  Pt presents with decreased overall strength, activity tolerance and balance.  Tolerated ambulation in hallway, however short distance due to fatigue.  Did not attempt stairs on this visit, but will need to perform before D/C home.  PT recommends HHPT for follow up, however pt declining.  Pts son to speak with pt and motivate him to agree with HHPT.      Follow Up Recommendations Home health PT    Equipment Recommendations  None recommended by PT    Recommendations for Other Services       Precautions / Restrictions Precautions Precautions: Fall Precaution Comments: unna boot on LLE Restrictions Weight Bearing Restrictions: No Other Position/Activity Restrictions: Has boot that he wears at home      Mobility  Bed Mobility Overal bed mobility: Modified Independent Bed Mobility: Supine to Sit     Supine to sit: Modified independent (Device/Increase time)     General bed mobility comments: Requires use of bed rail to get to EOB  Transfers Overall transfer level: Needs assistance Equipment used: Rolling walker (2 wheeled) Transfers: Sit to/from Stand Sit to Stand: Supervision         General transfer comment: Pt able to transfer at S level, however does require  mod cues for hand placement.   Ambulation/Gait Ambulation/Gait assistance: Supervision;Min guard Ambulation Distance (Feet): 60 Feet Assistive device: Rolling walker (2 wheeled)       General Gait Details: Pt with forward flexed posture and short shuffled steps, however no overt LOB noted during gait or standing.  HR remainded in 70's throughout mobility, SaO2 in 90's on RA.  Note pt with decreased stance time on LLE due to unna boot, however he states no pain.   Stairs            Wheelchair Mobility    Modified Rankin (Stroke Patients Only)       Balance Overall balance assessment: Needs assistance Sitting-balance support: Feet supported Sitting balance-Leahy Scale: Good     Standing balance support: During functional activity Standing balance-Leahy Scale: Fair                               Pertinent Vitals/Pain No pain during session.     Home Living Family/patient expects to be discharged to:: Private residence Living Arrangements: Spouse/significant other;Children Available Help at Discharge: Available 24 hours/day Type of Home: House Home Access: Stairs to enter Entrance Stairs-Rails: Left Entrance Stairs-Number of Steps: 4 Home Layout: One level Home Equipment: Walker - 2 wheels Additional Comments: has shower stool     Prior Function Level of Independence: Independent with assistive device(s)  Hand Dominance        Extremity/Trunk Assessment               Lower Extremity Assessment: Generalized weakness      Cervical / Trunk Assessment: Kyphotic  Communication      Cognition Arousal/Alertness: Awake/alert Behavior During Therapy: WFL for tasks assessed/performed Overall Cognitive Status: Within Functional Limits for tasks assessed                      General Comments      Exercises        Assessment/Plan    PT Assessment Patient needs continued PT services  PT Diagnosis  Generalized weakness   PT Problem List Decreased strength;Decreased activity tolerance;Decreased balance;Decreased mobility  PT Treatment Interventions DME instruction;Gait training;Stair training;Functional mobility training;Therapeutic activities;Therapeutic exercise;Balance training;Patient/family education   PT Goals (Current goals can be found in the Care Plan section) Acute Rehab PT Goals Patient Stated Goal: to return home. PT Goal Formulation: With patient/family Time For Goal Achievement: 11/11/13 Potential to Achieve Goals: Good    Frequency Min 3X/week   Barriers to discharge        Co-evaluation               End of Session Equipment Utilized During Treatment: Gait belt Activity Tolerance: Patient tolerated treatment well Patient left: in chair;with call bell/phone within reach;with family/visitor present Nurse Communication: Mobility status         Time: 1517-1540 PT Time Calculation (min): 23 min   Charges:   PT Evaluation $Initial PT Evaluation Tier I: 1 Procedure PT Treatments $Gait Training: 8-22 mins   PT G CodesVista Deck 11/04/2013, 3:45 PM

## 2013-11-05 ENCOUNTER — Inpatient Hospital Stay (HOSPITAL_COMMUNITY): Admission: RE | Admit: 2013-11-05 | Payer: Medicare Other | Source: Ambulatory Visit

## 2013-11-05 DIAGNOSIS — I4891 Unspecified atrial fibrillation: Secondary | ICD-10-CM

## 2013-11-05 DIAGNOSIS — I319 Disease of pericardium, unspecified: Secondary | ICD-10-CM

## 2013-11-05 LAB — GLUCOSE, CAPILLARY
GLUCOSE-CAPILLARY: 140 mg/dL — AB (ref 70–99)
Glucose-Capillary: 138 mg/dL — ABNORMAL HIGH (ref 70–99)
Glucose-Capillary: 139 mg/dL — ABNORMAL HIGH (ref 70–99)
Glucose-Capillary: 157 mg/dL — ABNORMAL HIGH (ref 70–99)

## 2013-11-05 MED ORDER — SODIUM CHLORIDE 0.9 % IV SOLN
INTRAVENOUS | Status: DC
  Administered 2013-11-07: 07:00:00 via INTRAVENOUS

## 2013-11-05 MED ORDER — SODIUM CHLORIDE 0.9 % IV SOLN
Freq: Once | INTRAVENOUS | Status: AC
  Administered 2013-11-05: 250 mL via INTRAVENOUS

## 2013-11-05 MED ORDER — DILTIAZEM HCL 100 MG IV SOLR
5.0000 mg/h | INTRAVENOUS | Status: DC
  Administered 2013-11-05 (×2): 5 mg/h via INTRAVENOUS
  Filled 2013-11-05 (×2): qty 100

## 2013-11-05 NOTE — Progress Notes (Signed)
[facility] PROGRESS NOTE  [name] UQJ:[id number] DOB: 11-25-1933 DOA: 11/01/2013 PCP: Dorrene German, MD  Assessment/Plan: 80 y.o. male with a history of a Previous CVA without Residual Deficits, HTN, DM2, and Stage III CKD who presents to the ED with complaints of intermittent Chest Pain and palpitations, found to have pericardial effusion    1. Pericardial effusion -2-D echo showed pericardial effusion with moderate tamponade -s/p pericardiocentesis effusion now w/ tamponade, 750 cc drained 08/03 -per cards: recheck echo AM; if significant additional re-accumulation ? pericardial window. -CT surgery also consulted, appreciate consult from Dr. Dorris Fetch   -PAF 8/4; HR controlled with cardizem; h/o CVA likely needs [facility], defer to cardiology   2. Chest pain, Patient was ruled out for acute ACS, denied any specific chest pain 3. Diabetes mellitus uncontrolled complicated with diabetic neuropathy, chronic left foot ulcer  - Hemoglobin A1c 5.9, continue sliding scale insulin  4. Chronic diabetic left foot ulcer: Follows Dr. Lajoyce Corners  - Has Roland Rack boot dressing, weeping, hence placed on doxycycline,  - wound care consult, dressing change done on 8/3  5. Hypertension: stable  6. Diarrhea improved C. Difficile negative  7. Scrotal edema with inguinal hernia  - Scrotal ultrasound showed bowel containing left inguinal hernia admitting bowel into the scrotal sac.  - Surgery was consulted and recommended outpatient elective repair  8. Thrombocytopenia; Lovenox discontinued, patient still trending down  -recheck in AM; cont SCD 9. CKD (chronic kidney disease) stage 3, GFR 30-59 ml/min; gentle hydration, baseline Cr 1.4-1.6, cr at baseline  10. Anemia: Appears to be chronic, no precipitous drop, likely decrease from hemodilution   Code Status: full Family Communication:  D/w patient,  (indicate person spoken with, relationship, and if by phone, the number) Disposition Plan: pend clinical  improvement    Consultants:  Cardiology  Procedures:  2-D echo  Pericardiocentesis Antibiotics:  Doxycycline   HPI/Subjective: alert  Objective: Filed Vitals:   11/05/13 1100  BP: 121/50  Pulse: 91  Temp: 98.2 F (36.8 C)  Resp:     Intake/Output Summary (Last 24 hours) at 11/05/13 1216 Last data filed at 11/05/13 0911  Gross per 24 hour  Intake 310.83 ml  Output   1001 ml  Net -690.17 ml   Filed Weights   11/01/13 2204 11/04/13 0400  Weight: 77.111 kg (170 lb) 78.2 kg (172 lb 6.4 oz)    Exam:   General:  alert  Cardiovascular: s1,s2 irregular   Respiratory: CTA BL  Abdomen: soft, nt,nd   Musculoskeletal: L foot dressed   Data Reviewed: Basic Metabolic Panel:  Recent Labs Lab 11/01/13 2221 11/02/13 0445 11/03/13 0510  NA 134* 135* 140  K 4.3 4.0 4.2  CL 95* 100 106  CO2 24 24 24   GLUCOSE 146* 180* 161*  BUN 43* 42* 38*  CREATININE 1.49* 1.63* 1.56*  CALCIUM 9.0 8.6 8.4  MG  --  2.0  --    Liver Function Tests: No results found for this basename: AST, ALT, ALKPHOS, BILITOT, PROT, ALBUMIN,  in the last 168 hours No results found for this basename: LIPASE, AMYLASE,  in the last 168 hours No results found for this basename: AMMONIA,  in the last 168 hours CBC:  Recent Labs Lab 11/01/13 2221 11/02/13 0445 11/03/13 0510  WBC 7.7 7.2 4.9  HGB 10.4* 9.4* 8.9*  HCT 31.1* 28.5* 27.5*  MCV 85.0 86.6 85.9  PLT 126* PLATELET CLUMPS NOTED ON SMEAR, COUNT APPEARS DECREASED 92*   Cardiac Enzymes:  Recent Labs Lab  11/02/13 0100 11/02/13 1005 11/02/13 1504 11/02/13 2059  TROPONINI <0.30 <0.30 <0.30 <0.30   BNP (last 3 results) No results found for this basename: PROBNP,  in the last 8760 hours CBG:  Recent Labs Lab 11/04/13 1146 11/04/13 1638 11/04/13 2052 11/05/13 0729 11/05/13 1121  GLUCAP 185* 182* 164* 140* 138*    Recent Results (from the past 240 hour(s))  CLOSTRIDIUM DIFFICILE BY PCR     Status: None   Collection  Time    11/02/13  9:17 AM      Result Value Ref Range Status   C difficile by pcr NEGATIVE  NEGATIVE Final  BODY FLUID CULTURE     Status: None   Collection Time    11/03/13 11:50 AM      Result Value Ref Range Status   Specimen Description FLUID PERICARDIAL   Final   Special Requests NONE   Final   Gram Stain     Final   Value: RARE WBC PRESENT, PREDOMINANTLY MONONUCLEAR     NO ORGANISMS SEEN     Performed at [facility]   Culture     Final   Value: NO GROWTH 2 DAYS     Performed at [facility]   Report Status PENDING   Incomplete     Studies: Ct Head Wo Contrast  11/04/2013   CLINICAL DATA:  Mental status change  EXAM: CT HEAD WITHOUT CONTRAST  TECHNIQUE: Contiguous axial images were obtained from the base of the skull through the vertex without intravenous contrast.  COMPARISON:  CT 09/24/2013  FINDINGS: Advanced atrophy and advanced chronic microvascular ischemic changes. Small chronic infarct left occipital lobe  Negative for acute infarct. Negative for hemorrhage or mass. No change from the prior study.  IMPRESSION: Advanced atrophy and advanced chronic microvascular ischemia. No acute abnormality.   Electronically Signed   By: Marlan Palauharles  Clark M.D.   On: 11/04/2013 02:34    Scheduled Meds: . aspirin EC  325 mg Oral Daily  . doxycycline  100 mg Oral Q12H  . ferrous sulfate  325 mg Oral TID WC  . insulin aspart  0-5 Units Subcutaneous QHS  . insulin aspart  0-9 Units Subcutaneous TID WC  . simvastatin  10 mg Oral Q lunch  . sodium chloride  3 mL Intravenous Q12H   Continuous Infusions: . sodium chloride 1,000 mL (11/03/13 1327)  . diltiazem (CARDIZEM) infusion 5 mg/hr (11/05/13 0430)    Principal Problem:   Chest pain Active Problems:   Diabetes mellitus without complication   Hypertension   CKD (chronic kidney disease) stage 3, GFR 30-59 ml/min   Anemia of chronic disease   PAD (peripheral artery disease)   Pericardial effusion   Ulcer of foot,  chronic   Thrombocytopenia   Diarrhea   Scrotal edema   Atrial fibrillation with rapid ventricular response    Time spent: >35 minutes     Esperanza SheetsBURIEV, Shankar Silber N  [facility] Pager [phone]. If 7PM-7AM, please contact night-coverage at [email or web address], password [email or web address] 11/05/2013, 12:16 PM  LOS: 4 days

## 2013-11-05 NOTE — Progress Notes (Signed)
PT Cancellation Note  Patient Details Name: [name] MRN: [id number] DOB: September 26, 1933   Cancelled Treatment:    Reason Eval/Treat Not Completed: Medical issues which prohibited therapy. Pt having A-fib and rapid response called earlier this AM.  RN requested to hold off on PT at this time. Will continue to follow.   Linley Moxley LUBECK 11/05/2013, 9:46 AM

## 2013-11-05 NOTE — Progress Notes (Signed)
Called by Harvin Hazel, RN at [phone]  that pt had converted to Atrial Fib with RVR rate 150s.  Received order from Dr Don Broach to give cardizem bolus and drip but  SBP 90.  NS 250 mg bolus infusing per Dr Don Broach. Pt alert, oriented , denies CP or SOB. RR 22 on 2l [place].  Bilat BS clear, diminished left base.  Cardizem bolus 10 mg IV given at 0430 with  BP 115/63 and  HR 122 AFIB .Drip rate begun at 5 mg/hr after bolus.  A second NS 250 bolus was given after BP dropped to 101/60.  Afib 115. 0450  108/66  AFIB 112.  Hand off report given to [facility].  Will follow-up as needed.

## 2013-11-05 NOTE — Progress Notes (Signed)
Patient Name: Vista LawmanRobert Laible Date of Encounter: 11/05/2013  Principal Problem:   Chest pain Active Problems:   Diabetes mellitus without complication   Hypertension   CKD (chronic kidney disease) stage 3, GFR 30-59 ml/min   Anemia of chronic disease   PAD (peripheral artery disease)   Pericardial effusion   Ulcer of foot, chronic   Thrombocytopenia   Diarrhea   Scrotal edema    Patient Profile: 80 yo male w/ hx peric effusion, CVA, DM, HTN, CKD, was admitted 08/02 with chest pain. Cards saw, effusion now w/ tamponade, 750 cc drained 08/03.    SUBJECTIVE: Very weak, unaware of irreg HR. Denies chest pain, breathing better.  OBJECTIVE Filed Vitals:   11/05/13 0430 11/05/13 0439 11/05/13 0450 11/05/13 0530  BP: 115/63 101/60 108/66 101/58  Pulse: 140 108 112 106  Temp:    98.4 F (36.9 C)  TempSrc:    Oral  Resp:      Height:      Weight:      SpO2: 99%   100%    Intake/Output Summary (Last 24 hours) at 11/05/13 0903 Last data filed at 11/05/13 0640  Gross per 24 hour  Intake 310.83 ml  Output   1000 ml  Net -689.17 ml   Filed Weights   11/01/13 2204 11/04/13 0400  Weight: 170 lb (77.111 kg) 172 lb 6.4 oz (78.2 kg)    PHYSICAL EXAM General: Well developed, well nourished, male in no acute distress. Head: Normocephalic, atraumatic.  Neck: Supple without bruits, JVD not elevated. Lungs:  Resp regular and unlabored, decreased breath sounds bases, few rales. Heart: Irregular rate and rhythm, S1, S2, no S3, S4, or murmur; no rub. Abdomen: Soft, non-tender, non-distended, BS + x 4.  Extremities: No clubbing, cyanosis, no edema on right, left lower extremity bandaged and not disturbed, capillary refill delayed  Neuro: Alert and oriented X 2. Moves all extremities spontaneously. Psych: Normal affect.  LABS: CBC: Recent Labs  11/03/13 0510  WBC 4.9  HGB 8.9*  HCT 27.5*  MCV 85.9  PLT 92*   INR: Recent Labs  11/03/13 0615  INR 1.32   Basic  Metabolic Panel: Recent Labs  11/03/13 0510  NA 140  K 4.2  CL 106  CO2 24  GLUCOSE 161*  BUN 38*  CREATININE 1.56*  CALCIUM 8.4   Cardiac Enzymes: Recent Labs  11/02/13 1005 11/02/13 1504 11/02/13 2059  TROPONINI <0.30 <0.30 <0.30   Hemoglobin A1C: Recent Labs  11/02/13 0908  HGBA1C 5.9*   Lab Results  Component Value Date   TSH 1.280 09/30/2013    TELE:   Sinus rhythm, converted to atrial fibrillation RVR last p.m. rate improved on IV Cardizem    ECHO: 11/04/2013 Study Conclusions - Pericardium, extracardiac: Moderate circumferential pericardial effusion: inferiorly: 20 mm inferolaterally: 13 mm anteriorly: 33 mm. Impressions: - This was a limited study for the evaluation of the pericardial effusion post pericardiocentesis. There is decreased amount of pericardial effusion, however still moderate effusion present. There are no signs of pericardial tamponade. We would recommend to repeat limited study in 1-2 days.   Radiology/Studies: Ct Head Wo Contrast  11/04/2013   CLINICAL DATA:  Mental status change  EXAM: CT HEAD WITHOUT CONTRAST  TECHNIQUE: Contiguous axial images were obtained from the base of the skull through the vertex without intravenous contrast.  COMPARISON:  CT 09/24/2013  FINDINGS: Advanced atrophy and advanced chronic microvascular ischemic changes. Small chronic infarct left occipital lobe  Negative  for acute infarct. Negative for hemorrhage or mass. No change from the prior study.  IMPRESSION: Advanced atrophy and advanced chronic microvascular ischemia. No acute abnormality.   Electronically Signed   By: Marlan Palau M.D.   On: 11/04/2013 02:34     Current Medications:  . aspirin EC  325 mg Oral Daily  . doxycycline  100 mg Oral Q12H  . ferrous sulfate  325 mg Oral TID WC  . insulin aspart  0-5 Units Subcutaneous QHS  . insulin aspart  0-9 Units Subcutaneous TID WC  . simvastatin  10 mg Oral Q lunch  . sodium chloride  3 mL Intravenous  Q12H   . sodium chloride 1,000 mL (11/03/13 1327)  . diltiazem (CARDIZEM) infusion 5 mg/hr (11/05/13 0430)    ASSESSMENT AND PLAN: Principal Problem:   Chest pain - improved, ez negative for MI, felt secondary to peric effusion    Pericardial effusion - some reaccumulation seen on echocardiogram 08/04, repeat in one-2 days. Exudative by protein level, few WBCs and no organisms seen, reports are incomplete. Continue to follow.    Atrial fibrillation, rapid ventricular response - rate is improved on IV Cardizem. Blood pressure is stable. Can convert to by mouth Cardizem per M.D.. Time of onset is clearly documented, currently less than 24 hours.     Anticoagulation - CHADsVASC score is elevated but with pericardial effusion and low platelets, not sure he is an anticoagulation candidate so no changes for now.  Otherwise, per primary M.D. Active Problems:   Diabetes mellitus without complication   Hypertension   CKD (chronic kidney disease) stage 3, GFR 30-59 ml/min   Anemia of chronic disease   PAD (peripheral artery disease)   Ulcer of foot, chronic   Thrombocytopenia   Diarrhea   Scrotal edema   Signed, Theodore Demark , PA-C 9:03 AM 11/05/2013    Patient seen and examined. Agree with assessment and plan.Pt developed AF during the early morning; now on low dose Cardizem drip at 5 with AF rate in the 80's and BP ~95-100. Will increase fluids to 50 cc/hr. Discussed recurrent moderate effusion. Drainage pigtailcatheter was never able to be inserted into pericardium, but 750 cc fluid removed with dilator. Will recheck echo tomorrow; if significant additional re-accumulation would recommend pericardial window.   Lennette Bihari, MD, [facility] 11/05/2013 10:23 AM

## 2013-11-05 NOTE — Progress Notes (Signed)
At 0314 pt converted to Afib RVR with Hr 100's-150's. Pt asymptomatic. VSS. 12 lead EKG done confirming conversion. Dr. Donnetta Simpers made aware and orders received. Will start on drip and cont to monitor closely.

## 2013-11-05 NOTE — Progress Notes (Signed)
At 0415 Pt's BP down to 94/50 prior to starting cardizem gtt. Dr. Donnetta Simpers made aware and rapid response notified. Orders for NS bolus and if BP improved to start gtt per orders. Bolus given and SBP up to 115 so cardizem bolus given and gtt started at 5cc/hr. Will cont to monitor closely.

## 2013-11-05 NOTE — Progress Notes (Signed)
  Echocardiogram 2D Echocardiogram has been performed.  Arvil Chaco 11/05/2013, 2:13 PM

## 2013-11-06 DIAGNOSIS — I319 Disease of pericardium, unspecified: Secondary | ICD-10-CM

## 2013-11-06 LAB — GLUCOSE, CAPILLARY
GLUCOSE-CAPILLARY: 158 mg/dL — AB (ref 70–99)
GLUCOSE-CAPILLARY: 139 mg/dL — AB (ref 70–99)
Glucose-Capillary: 113 mg/dL — ABNORMAL HIGH (ref 70–99)
Glucose-Capillary: 147 mg/dL — ABNORMAL HIGH (ref 70–99)

## 2013-11-06 LAB — CBC
HCT: 26.9 % — ABNORMAL LOW (ref 39.0–52.0)
Hemoglobin: 9 g/dL — ABNORMAL LOW (ref 13.0–17.0)
MCH: 28.4 pg (ref 26.0–34.0)
MCHC: 33.5 g/dL (ref 30.0–36.0)
MCV: 84.9 fL (ref 78.0–100.0)
Platelets: 157 10*3/uL (ref 150–400)
RBC: 3.17 MIL/uL — AB (ref 4.22–5.81)
RDW: 15.4 % (ref 11.5–15.5)
WBC: 5.8 10*3/uL (ref 4.0–10.5)

## 2013-11-06 LAB — OTHER BODY FLUID CHEMISTRY

## 2013-11-06 LAB — BODY FLUID CULTURE: Culture: NO GROWTH

## 2013-11-06 MED ORDER — DOCUSATE SODIUM 100 MG PO CAPS
100.0000 mg | ORAL_CAPSULE | Freq: Two times a day (BID) | ORAL | Status: DC
  Administered 2013-11-06 – 2013-11-07 (×3): 100 mg via ORAL
  Filled 2013-11-06 (×4): qty 1

## 2013-11-06 NOTE — Progress Notes (Signed)
Patient evaluated for community based chronic disease management services with [facility] Care Management Program as a benefit of patient's [employer]. Spoke with patient at bedside to explain [facility] Care Management services.  Patient has declined services at this time but does want a prescription for a new glucometer.  Informed RNCM of request.  Left contact information and [facility] literature at bedside. Made Inpatient Case Manager aware that [facility] Care Management following. Of note, [facility] Care Management services does not replace or interfere with any services that are arranged by inpatient case management or social work.  For additional questions or referrals please contact Anibal Henderson BSN RN [facility] Liaison at [phone].

## 2013-11-06 NOTE — Progress Notes (Signed)
At 1945 pt had 6 second pause a beat 3.8 sec pause a beat and another 3 second pause and then converted to SR with HR in the 60's. Pts asymptomatic with pauses and conversion. VSS. Cardizem gtt d/ced and Dr Donnetta Simpers called and made aware of pause, conversion and gtt turned off. MD ordered for gtt to be d/ced. Will cont to monitor.

## 2013-11-06 NOTE — Progress Notes (Signed)
Physical Therapy Treatment Patient Details Name: [name] MRN: [id number] DOB: 10/08/1933 Today's Date: 11/06/2013    History of Present Illness [name] is a 80 y.o. male with a history of a Previous CVA without Residual Deficits,  HTN, DM2, and Stage III CKD who presents to the ED with complaints of intermittent Chest Pain and palpitations since 5 pm.    He denies having any SOB, Nausea, Vomiting or Diaphoresis associated with the pain.   He was evaluated in the ED by [facility] and referred for a cardiac rule-out.   On his last admission to the hospital he was found to have a cardiac Tamponade, and tonight he was found to have no significant pericardial effusion by cardiology.      PT Comments    Pt able to increase ambulation distance today.  Pt has L LE wound with wrap and noted odor in room.  No restrictions noted for L LE.  Follow Up Recommendations  Home health PT     Equipment Recommendations  None recommended by PT    Recommendations for Other Services       Precautions / Restrictions Precautions Precautions: Fall Precaution Comments: unna boot on LLE Restrictions Other Position/Activity Restrictions: Has boot that he wears at home    Mobility  Bed Mobility   Bed Mobility: Supine to Sit     Supine to sit: Min assist     General bed mobility comments: A to get trunk upright  Transfers Overall transfer level: Needs assistance Equipment used: Rolling walker (2 wheeled) Transfers: Sit to/from Stand Sit to Stand: Supervision         General transfer comment: Cues for hand placement.  Ambulation/Gait Ambulation/Gait assistance: Min guard;Supervision Ambulation Distance (Feet): 200 Feet Assistive device: Rolling walker (2 wheeled) Gait Pattern/deviations: Decreased step length - right;Decreased step length - left;Trunk flexed Gait velocity: decreased- 1 standing rest break   General Gait Details: Pt with unna boot on L LE andapplied sock to  forefoot.  No denies pain.   Stairs            Wheelchair Mobility    Modified Rankin (Stroke Patients Only)       Balance     Sitting balance-Leahy Scale: Good     Standing balance support: Bilateral upper extremity supported Standing balance-Leahy Scale: Fair Standing balance comment: at [employer]                    Cognition Arousal/Alertness: Awake/alert Behavior During Therapy: WFL for tasks assessed/performed Overall Cognitive Status: Within Functional Limits for tasks assessed                      Exercises General Exercises - Lower Extremity Hip Flexion/Marching: Strengthening;Both;10 reps;Standing Mini-Sqauts: Strengthening;Both;10 reps;Standing    General Comments General comments (skin integrity, edema, etc.): Noted odor upon entry into room from L LE wound.  It had been re-wrapped recently.  Noted during gait that it appeared drainage was starting to be visible through wrap.  Nursing notified.      Pertinent Vitals/Pain No c/o pain    Home Living                      Prior Function            PT Goals (current goals can now be found in the care plan section) Acute Rehab PT Goals Patient Stated Goal: to return home. PT Goal Formulation: With patient Time For  Goal Achievement: 11/11/13 Potential to Achieve Goals: Good Progress towards PT goals: Progressing toward goals    Frequency  Min 3X/week    PT Plan Current plan remains appropriate    Co-evaluation             End of Session Equipment Utilized During Treatment: Gait belt Activity Tolerance: Patient tolerated treatment well Patient left: in chair;with call bell/phone within reach     Time: 2706-2376 PT Time Calculation (min): 28 min  Charges:  $Gait Training: 8-22 mins $Therapeutic Exercise: 8-22 mins                    G Codes:      [name] 11/06/2013, 12:16 PM

## 2013-11-06 NOTE — Progress Notes (Addendum)
[facility] PROGRESS NOTE  Trayce Kees JDB:[id number] DOB: 01-Sep-1933 DOA: 11/01/2013 PCP: Dorrene German, MD  Assessment/Plan: 80 y.o. male with a history of a Previous CVA without Residual Deficits, HTN, DM2, and Stage III CKD who presents to the ED with complaints of intermittent Chest Pain and palpitations, found to have pericardial effusion    1. Pericardial effusion -2-D echo showed pericardial effusion with moderate tamponade -s/p pericardiocentesis effusion now w/ tamponade, 750 cc drained 08/03 -8/5: repeat echo: Moderatecircumferential effusion without tamponade. Similar to 8/4 study  -defer to cardiology ? pericardial window -CT surgery also consulted, appreciate consult from Dr. Dorris Fetch   -PAF 8/4; HR controlled with cardizem; h/o CVA likely needs [facility], defer to cardiology, appreciate th input  2. Chest pain, Patient was ruled out for acute ACS, denied any specific chest pain 3. Diabetes mellitus uncontrolled complicated with diabetic neuropathy, chronic left foot ulcer  - Hemoglobin A1c 5.9, continue sliding scale insulin  4. Chronic diabetic left foot ulcer: Follows Dr. Lajoyce Corners  - Has Roland Rack boot dressing, weeping, hence placed on doxycycline,  - wound care consult, dressing change done on 8/3  5. Hypertension: stable  6. Diarrhea improved C. Difficile negative  7. Scrotal edema with inguinal hernia  - Scrotal ultrasound showed bowel containing left inguinal hernia admitting bowel into the scrotal sac.  - Surgery was consulted and recommended outpatient elective repair  8. Thrombocytopenia; Lovenox discontinued, patient still trending down  -recheck in AM; cont SCD 9. CKD (chronic kidney disease) stage 3, GFR 30-59 ml/min; gentle hydration, baseline Cr 1.4-1.6, cr at baseline  10. Anemia: Appears to be chronic, no precipitous drop, likely decrease from hemodilution   Code Status: full Family Communication:  D/w patient, called updated Homeyer,Mark Son  [phone]   (indicate person spoken with, relationship, and if by phone, the number) Disposition Plan: pend clinical improvement    Consultants:  Cardiology  Procedures:  2-D echo  Pericardiocentesis Antibiotics:  Doxycycline   HPI/Subjective: alert  Objective: Filed Vitals:   11/06/13 0818  BP: 122/54  Pulse:   Temp: 97.9 F (36.6 C)  Resp: 18    Intake/Output Summary (Last 24 hours) at 11/06/13 0929 Last data filed at 11/06/13 0500  Gross per 24 hour  Intake   1160 ml  Output    350 ml  Net    810 ml   Filed Weights   11/01/13 2204 11/04/13 0400 11/06/13 0400  Weight: 77.111 kg (170 lb) 78.2 kg (172 lb 6.4 oz) 78 kg (171 lb 15.3 oz)    Exam:   General:  alert  Cardiovascular: s1,s2 irregular   Respiratory: CTA BL  Abdomen: soft, nt,nd   Musculoskeletal: L foot dressed   Data Reviewed: Basic Metabolic Panel:  Recent Labs Lab 11/01/13 2221 11/02/13 0445 11/03/13 0510  NA 134* 135* 140  K 4.3 4.0 4.2  CL 95* 100 106  CO2 24 24 24   GLUCOSE 146* 180* 161*  BUN 43* 42* 38*  CREATININE 1.49* 1.63* 1.56*  CALCIUM 9.0 8.6 8.4  MG  --  2.0  --    Liver Function Tests: No results found for this basename: AST, ALT, ALKPHOS, BILITOT, PROT, ALBUMIN,  in the last 168 hours No results found for this basename: LIPASE, AMYLASE,  in the last 168 hours No results found for this basename: AMMONIA,  in the last 168 hours CBC:  Recent Labs Lab 11/01/13 2221 11/02/13 0445 11/03/13 0510 11/06/13 0612  WBC 7.7 7.2 4.9 5.8  HGB 10.4* 9.4*  8.9* 9.0*  HCT 31.1* 28.5* 27.5* 26.9*  MCV 85.0 86.6 85.9 84.9  PLT 126* PLATELET CLUMPS NOTED ON SMEAR, COUNT APPEARS DECREASED 92* 157   Cardiac Enzymes:  Recent Labs Lab 11/02/13 0100 11/02/13 1005 11/02/13 1504 11/02/13 2059  TROPONINI <0.30 <0.30 <0.30 <0.30   BNP (last 3 results) No results found for this basename: PROBNP,  in the last 8760 hours CBG:  Recent Labs Lab 11/05/13 0729  11/05/13 1121 11/05/13 1626 11/05/13 2037 11/06/13 0757  GLUCAP 140* 138* 139* 157* 158*    Recent Results (from the past 240 hour(s))  CLOSTRIDIUM DIFFICILE BY PCR     Status: None   Collection Time    11/02/13  9:17 AM      Result Value Ref Range Status   C difficile by pcr NEGATIVE  NEGATIVE Final  BODY FLUID CULTURE     Status: None   Collection Time    11/03/13 11:50 AM      Result Value Ref Range Status   Specimen Description FLUID PERICARDIAL   Final   Special Requests NONE   Final   Gram Stain     Final   Value: RARE WBC PRESENT, PREDOMINANTLY MONONUCLEAR     NO ORGANISMS SEEN     Performed at [facility]   Culture     Final   Value: NO GROWTH 2 DAYS     Performed at [facility]   Report Status PENDING   Incomplete     Studies: No results found.  Scheduled Meds: . aspirin EC  325 mg Oral Daily  . doxycycline  100 mg Oral Q12H  . ferrous sulfate  325 mg Oral TID WC  . insulin aspart  0-5 Units Subcutaneous QHS  . insulin aspart  0-9 Units Subcutaneous TID WC  . simvastatin  10 mg Oral Q lunch  . sodium chloride  3 mL Intravenous Q12H   Continuous Infusions: . sodium chloride Stopped (11/05/13 1257)  . sodium chloride 500 mL (11/05/13 1300)    Principal Problem:   Chest pain Active Problems:   Diabetes mellitus without complication   Hypertension   CKD (chronic kidney disease) stage 3, GFR 30-59 ml/min   Anemia of chronic disease   PAD (peripheral artery disease)   Pericardial effusion   Ulcer of foot, chronic   Thrombocytopenia   Diarrhea   Scrotal edema   Atrial fibrillation with rapid ventricular response    Time spent: >35 minutes     Esperanza SheetsBURIEV, Royalty Domagala N  [facility] Pager [phone]. If 7PM-7AM, please contact night-coverage at [email or web address], password [email or web address] 11/06/2013, 9:29 AM  LOS: 5 days

## 2013-11-06 NOTE — Progress Notes (Signed)
  Echocardiogram 2D Echocardiogram has been performed.  Arvil Chaco 11/06/2013, 4:16 PM

## 2013-11-06 NOTE — Telephone Encounter (Signed)
Pt has been seen 

## 2013-11-06 NOTE — Consult Note (Signed)
WOC wound consult note  Reason for Consult: Drainage has leaked through compression wrap to left leg; Pt is followed by Dr Lajoyce Corners as outpatient prior to admission and Profore wraps are changed Q week.  Current wraps were applied on Monday. Refer to previous progress notes for description and measurements.  No change in appearance or size of wounds since previous admission. Wound type:full thickness in 2 locations Drainage: copious purulent serosanguinous drainage, with strong odor, soaked through 4 layers  Applied Aquacel over wounds to absorb drainage and provide antimicrobial benefits, then 4 layer Profore wraps applied. Pt can resume follow-up with Dr Lajoyce Corners after discharge or [facility] nurse will plan to change compression wraps next Thursday if still in the hospital at that time.  Cammie Mcgee MSN, RN, CWOCN, [place], CNS  [phone]

## 2013-11-06 NOTE — Progress Notes (Signed)
Patient Profile: 80 yo male w/ hx peric effusion, CVA, DM, HTN, CKD, was admitted 08/02 with chest pain. Cards saw, effusion now w/ tamponade, 750 cc drained 08/03.   Subjective: Walking in hallway with PT, no complaints  Objective: Vital signs in last 24 hours: Temp:  [97.9 F (36.6 C)-99 F (37.2 C)] 97.9 F (36.6 C) (08/06 0818) Pulse Rate:  [65-91] 74 (08/06 0400) Resp:  [18] 18 (08/06 0818) BP: (86-138)/(41-62) 122/54 mmHg (08/06 0818) SpO2:  [98 %-100 %] 100 % (08/06 0818) Weight:  [171 lb 15.3 oz (78 kg)] 171 lb 15.3 oz (78 kg) (08/06 0400) Weight change:  Last BM Date: 11/06/13 Intake/Output from previous day: +809 08/05 0701 - 08/06 0700 In: 1160 [P.O.:360; I.V.:800] Out: 351 [Urine:350; Stool:1] Intake/Output this shift: Total I/O In: 240 [P.O.:240] Out: -   PE: exam per MD, pt up in hall on my visit.   Lab Results:  Recent Labs  11/06/13 0612  WBC 5.8  HGB 9.0*  HCT 26.9*  PLT 157   Lab Results  Component Value Date   CHOL 83 10/01/2013   HDL 29* 10/01/2013   LDLCALC 42 10/01/2013   TRIG 58 10/01/2013   CHOLHDL 2.9 10/01/2013   Lab Results  Component Value Date   HGBA1C 5.9* 11/02/2013     Lab Results  Component Value Date   TSH 1.280 09/30/2013      Studies/Results: Echo 11/05/13 Left ventricle: Systolic function was mildly reduced. The estimated ejection fraction was in the range of 45% to 50%. Diffuse hypokinesis. - Mitral valve: < 25% respirophasic variation of mitral E inflow velocity. - Left atrium: The atrium was mildly dilated. - Right ventricle: The cavity size was normal. Systolic function was normal. - Systemic veins: IVC measured 2.2 cm with < 50% respirophasic variation, suggesting RA pressure 15 mmHg. - Pericardium, extracardiac: There is a moderate circumferential pericardial effusion without tamponade.  Impressions:  - Limited study to reassess pericardial effusion. Moderate   circumferential effusion without  tamponade. Similar to 8/4 study.     Medications: I have reviewed the patient's current medications. Scheduled Meds: . aspirin EC  325 mg Oral Daily  . doxycycline  100 mg Oral Q12H  . ferrous sulfate  325 mg Oral TID WC  . insulin aspart  0-5 Units Subcutaneous QHS  . insulin aspart  0-9 Units Subcutaneous TID WC  . simvastatin  10 mg Oral Q lunch  . sodium chloride  3 mL Intravenous Q12H   Continuous Infusions: . sodium chloride Stopped (11/05/13 1257)  . sodium chloride 500 mL (11/05/13 1300)   PRN Meds:.acetaminophen, acetaminophen, alum & mag hydroxide-simeth, HYDROmorphone (DILAUDID) injection, ondansetron (ZOFRAN) IV, ondansetron, oxyCODONE  Assessment/Plan: Chest pain - improved, ez negative for MI, felt secondary to peric effusion   Pericardial effusion - s/p /p pericardiocentesis effusion now w/ tamponade, 750 cc drained 08/03 some reaccumulation seen on echocardiogram 08/04, repeat in one-2 days Exudative by protein level, few WBCs and no organisms seen, reports are incomplete. Continue to follow. Echo 11/05/13 Pericardium, extracardiac: There is a moderate circumferential pericardial effusion without tamponade similar to echo 11/04/13-- if significant additional re-accumulation today would recommend pericardial window.     Atrial fibrillation, rapid ventricular response - rate is improved on IV Cardizem. Blood pressure is stable. Can convert to by mouth Cardizem per M.D.. Time of onset is clearly documented, currently less than 24 hours. --Pt converted to SR at 1945 last evening with 6 sec pause.  dilt drip stopped Maintaining SR at 74.  Anticoagulation - CHADsVASC score is elevated but with pericardial effusion and low platelets, not sure he is an anticoagulation candidate so no changes for now.  Otherwise, per primary M.D.  Active Problems:  Diabetes mellitus without complication  Hypertension  CKD (chronic kidney disease) stage 3, GFR 30-59 ml/min  Anemia of chronic  disease - stable at 9.0 PAD (peripheral artery disease)  Ulcer of foot, chronic  Thrombocytopenia  Diarrhea  Scrotal edema     LOS: 5 days   Time spent with pt. : 15 minutes. [facility] HospitalNGOLD,LAURA R  Nurse Practitioner Certified Pager [phone] or after 5pm and on weekends call [phone] 11/06/2013, 10:51 AM    Patient seen and examined. Agree with assessment and plan. Clinically pt feels better. No pleuritic cp. Converted to sinus rhythm. Reactive mesothelial cells noted on pericardial fluid cytology. Moderate re-accumulation with tamponade findings. ? Pericardial window if further accumulation.   Lennette Biharihomas A. Kelly, MD, [facility]FACC 11/06/2013 11:56 AM

## 2013-11-06 NOTE — Progress Notes (Signed)
Utilization review completed.  

## 2013-11-07 ENCOUNTER — Encounter (HOSPITAL_COMMUNITY)
Admission: EM | Disposition: A | Discharge status: Rehabilitation Facility | Payer: Self-pay | Source: Home / Self Care | Attending: Internal Medicine

## 2013-11-07 ENCOUNTER — Inpatient Hospital Stay (HOSPITAL_COMMUNITY): Payer: Medicare Other

## 2013-11-07 ENCOUNTER — Encounter (HOSPITAL_COMMUNITY): Payer: Medicare Other | Admitting: Anesthesiology

## 2013-11-07 ENCOUNTER — Telehealth (HOSPITAL_COMMUNITY): Payer: Self-pay | Admitting: *Deleted

## 2013-11-07 ENCOUNTER — Inpatient Hospital Stay (HOSPITAL_COMMUNITY): Payer: Medicare Other | Admitting: Anesthesiology

## 2013-11-07 ENCOUNTER — Encounter (HOSPITAL_COMMUNITY): Payer: Self-pay | Admitting: Anesthesiology

## 2013-11-07 DIAGNOSIS — J90 Pleural effusion, not elsewhere classified: Secondary | ICD-10-CM

## 2013-11-07 HISTORY — PX: SUBXYPHOID PERICARDIAL WINDOW: SHX5075

## 2013-11-07 LAB — POCT I-STAT, CHEM 8
BUN: 27 mg/dL — AB (ref 6–23)
CALCIUM ION: 1.29 mmol/L (ref 1.13–1.30)
Chloride: 103 mEq/L (ref 96–112)
Creatinine, Ser: 1.4 mg/dL — ABNORMAL HIGH (ref 0.50–1.35)
Glucose, Bld: 129 mg/dL — ABNORMAL HIGH (ref 70–99)
HCT: 27 % — ABNORMAL LOW (ref 39.0–52.0)
HEMOGLOBIN: 9.2 g/dL — AB (ref 13.0–17.0)
Potassium: 4.1 mEq/L (ref 3.7–5.3)
SODIUM: 139 meq/L (ref 137–147)
TCO2: 22 mmol/L (ref 0–100)

## 2013-11-07 LAB — BODY FLUID CELL COUNT WITH DIFFERENTIAL
Eos, Fluid: 0 %
LYMPHS FL: 1 %
Monocyte-Macrophage-Serous Fluid: 16 % — ABNORMAL LOW (ref 50–90)
Neutrophil Count, Fluid: 83 % — ABNORMAL HIGH (ref 0–25)
OTHER CELLS FL: 0 %
Total Nucleated Cell Count, Fluid: 247 cu mm (ref 0–1000)

## 2013-11-07 LAB — GLUCOSE, CAPILLARY
GLUCOSE-CAPILLARY: 162 mg/dL — AB (ref 70–99)
Glucose-Capillary: 118 mg/dL — ABNORMAL HIGH (ref 70–99)
Glucose-Capillary: 103 mg/dL — ABNORMAL HIGH (ref 70–99)
Glucose-Capillary: 126 mg/dL — ABNORMAL HIGH (ref 70–99)

## 2013-11-07 LAB — CBC
HCT: 26.7 % — ABNORMAL LOW (ref 39.0–52.0)
Hemoglobin: 8.9 g/dL — ABNORMAL LOW (ref 13.0–17.0)
MCH: 27.8 pg (ref 26.0–34.0)
MCHC: 33.3 g/dL (ref 30.0–36.0)
MCV: 83.4 fL (ref 78.0–100.0)
Platelets: 183 10*3/uL (ref 150–400)
RBC: 3.2 MIL/uL — AB (ref 4.22–5.81)
RDW: 15 % (ref 11.5–15.5)
WBC: 4.7 10*3/uL (ref 4.0–10.5)

## 2013-11-07 LAB — PREPARE RBC (CROSSMATCH)

## 2013-11-07 LAB — LACTATE DEHYDROGENASE, PLEURAL OR PERITONEAL FLUID: LD, Fluid: 505 U/L — ABNORMAL HIGH (ref 3–23)

## 2013-11-07 LAB — PROTEIN, BODY FLUID: Total protein, fluid: 3.7 g/dL

## 2013-11-07 SURGERY — CREATION, PERICARDIAL WINDOW, SUBXIPHOID APPROACH
Anesthesia: General | Site: Chest

## 2013-11-07 MED ORDER — FENTANYL CITRATE 0.05 MG/ML IJ SOLN
25.0000 ug | INTRAMUSCULAR | Status: DC | PRN
  Administered 2013-11-07 (×4): 25 ug via INTRAVENOUS

## 2013-11-07 MED ORDER — DEXMEDETOMIDINE HCL IN NACL 400 MCG/100ML IV SOLN
0.1000 ug/kg/h | INTRAVENOUS | Status: DC
  Filled 2013-11-07: qty 100

## 2013-11-07 MED ORDER — LABETALOL HCL 5 MG/ML IV SOLN
INTRAVENOUS | Status: AC
  Filled 2013-11-07: qty 4

## 2013-11-07 MED ORDER — OXYCODONE HCL 5 MG PO TABS: 5.0000 mg | ORAL_TABLET | Freq: Once | ORAL | Status: DC | PRN

## 2013-11-07 MED ORDER — PLASMA-LYTE 148 IV SOLN
INTRAVENOUS | Status: DC
  Filled 2013-11-07: qty 2.5

## 2013-11-07 MED ORDER — DEXTROSE 5 % IV SOLN
30.0000 ug/min | INTRAVENOUS | Status: DC
  Filled 2013-11-07: qty 2

## 2013-11-07 MED ORDER — ONDANSETRON HCL 4 MG/2ML IJ SOLN
INTRAMUSCULAR | Status: DC | PRN
  Administered 2013-11-07: 4 mg via INTRAVENOUS

## 2013-11-07 MED ORDER — FENTANYL CITRATE 0.05 MG/ML IJ SOLN
INTRAMUSCULAR | Status: AC
  Filled 2013-11-07: qty 2

## 2013-11-07 MED ORDER — OXYCODONE HCL 5 MG PO TABS
5.0000 mg | ORAL_TABLET | ORAL | Status: DC | PRN
  Administered 2013-11-09: 5 mg via ORAL
  Filled 2013-11-07 (×2): qty 1

## 2013-11-07 MED ORDER — LIDOCAINE HCL (CARDIAC) 20 MG/ML IV SOLN
INTRAVENOUS | Status: DC | PRN
  Administered 2013-11-07: 20 mg via INTRAVENOUS

## 2013-11-07 MED ORDER — LEVALBUTEROL HCL 0.63 MG/3ML IN NEBU
0.6300 mg | INHALATION_SOLUTION | Freq: Three times a day (TID) | RESPIRATORY_TRACT | Status: DC
  Administered 2013-11-07 – 2013-11-08 (×2): 0.63 mg via RESPIRATORY_TRACT
  Filled 2013-11-07 (×6): qty 3

## 2013-11-07 MED ORDER — 0.9 % SODIUM CHLORIDE (POUR BTL) OPTIME
TOPICAL | Status: DC | PRN
  Administered 2013-11-07: 2000 mL

## 2013-11-07 MED ORDER — MIDAZOLAM HCL 5 MG/5ML IJ SOLN
INTRAMUSCULAR | Status: DC | PRN
  Administered 2013-11-07: .5 mg via INTRAVENOUS

## 2013-11-07 MED ORDER — GLYCOPYRROLATE 0.2 MG/ML IJ SOLN
INTRAMUSCULAR | Status: DC | PRN
  Administered 2013-11-07: 0.6 mg via INTRAVENOUS

## 2013-11-07 MED ORDER — VANCOMYCIN HCL 10 G IV SOLR
1250.0000 mg | INTRAVENOUS | Status: DC
  Filled 2013-11-07: qty 1250

## 2013-11-07 MED ORDER — TRAMADOL HCL 50 MG PO TABS
50.0000 mg | ORAL_TABLET | Freq: Four times a day (QID) | ORAL | Status: DC | PRN
  Administered 2013-11-08 – 2013-11-09 (×2): 50 mg via ORAL
  Filled 2013-11-07 (×2): qty 1

## 2013-11-07 MED ORDER — ONDANSETRON HCL 4 MG/2ML IJ SOLN
INTRAMUSCULAR | Status: AC
  Filled 2013-11-07: qty 2

## 2013-11-07 MED ORDER — HEPARIN SODIUM (PORCINE) 1000 UNIT/ML IJ SOLN
INTRAMUSCULAR | Status: DC
  Filled 2013-11-07: qty 30

## 2013-11-07 MED ORDER — SODIUM CHLORIDE 0.9 % IV SOLN
INTRAVENOUS | Status: DC
  Filled 2013-11-07: qty 30

## 2013-11-07 MED ORDER — SODIUM CHLORIDE 0.9 % IV SOLN
INTRAVENOUS | Status: DC
  Filled 2013-11-07: qty 40

## 2013-11-07 MED ORDER — DEXTROSE 5 % IV SOLN
0.5000 ug/min | INTRAVENOUS | Status: DC
  Filled 2013-11-07: qty 4

## 2013-11-07 MED ORDER — BISACODYL 5 MG PO TBEC
10.0000 mg | DELAYED_RELEASE_TABLET | Freq: Every day | ORAL | Status: DC
  Administered 2013-11-08 – 2013-11-13 (×4): 10 mg via ORAL
  Filled 2013-11-07 (×3): qty 2

## 2013-11-07 MED ORDER — SENNOSIDES-DOCUSATE SODIUM 8.6-50 MG PO TABS
1.0000 | ORAL_TABLET | Freq: Every day | ORAL | Status: DC
  Administered 2013-11-08: 1 via ORAL
  Filled 2013-11-07 (×3): qty 1

## 2013-11-07 MED ORDER — DILTIAZEM LOAD VIA INFUSION
10.0000 mg | Freq: Once | INTRAVENOUS | Status: AC
  Administered 2013-11-07: 10 mg via INTRAVENOUS
  Filled 2013-11-07: qty 10

## 2013-11-07 MED ORDER — EPINEPHRINE HCL 1 MG/ML IJ SOLN
0.5000 ug/min | INTRAVENOUS | Status: DC
  Filled 2013-11-07: qty 4

## 2013-11-07 MED ORDER — DEXTROSE 5 % IV SOLN
750.0000 mg | INTRAVENOUS | Status: DC
  Filled 2013-11-07: qty 750

## 2013-11-07 MED ORDER — SUCCINYLCHOLINE CHLORIDE 20 MG/ML IJ SOLN
INTRAMUSCULAR | Status: AC
  Filled 2013-11-07: qty 1

## 2013-11-07 MED ORDER — MAGNESIUM SULFATE 50 % IJ SOLN
40.0000 meq | INTRAMUSCULAR | Status: DC
  Filled 2013-11-07: qty 10

## 2013-11-07 MED ORDER — SODIUM CHLORIDE 0.9 % IV SOLN
INTRAVENOUS | Status: DC
  Administered 2013-11-07 – 2013-11-09 (×3): via INTRAVENOUS

## 2013-11-07 MED ORDER — ETOMIDATE 2 MG/ML IV SOLN
INTRAVENOUS | Status: AC
  Filled 2013-11-07: qty 10

## 2013-11-07 MED ORDER — FENTANYL CITRATE 0.05 MG/ML IJ SOLN
INTRAMUSCULAR | Status: AC
  Filled 2013-11-07: qty 5

## 2013-11-07 MED ORDER — ROCURONIUM BROMIDE 50 MG/5ML IV SOLN
INTRAVENOUS | Status: AC
  Filled 2013-11-07: qty 1

## 2013-11-07 MED ORDER — NITROGLYCERIN IN D5W 200-5 MCG/ML-% IV SOLN: 2.0000 ug/min | INTRAVENOUS | Status: DC

## 2013-11-07 MED ORDER — OXYCODONE HCL 5 MG/5ML PO SOLN: 5.0000 mg | Freq: Once | ORAL | Status: DC | PRN

## 2013-11-07 MED ORDER — GLYCOPYRROLATE 0.2 MG/ML IJ SOLN
INTRAMUSCULAR | Status: AC
  Filled 2013-11-07: qty 3

## 2013-11-07 MED ORDER — NITROGLYCERIN IN D5W 200-5 MCG/ML-% IV SOLN
2.0000 ug/min | INTRAVENOUS | Status: DC
  Filled 2013-11-07: qty 250

## 2013-11-07 MED ORDER — DOPAMINE-DEXTROSE 3.2-5 MG/ML-% IV SOLN: 2.0000 ug/kg/min | INTRAVENOUS | Status: DC

## 2013-11-07 MED ORDER — CHLORHEXIDINE GLUCONATE CLOTH 2 % EX PADS
6.0000 | MEDICATED_PAD | Freq: Once | CUTANEOUS | Status: AC
  Administered 2013-11-07: 6 via TOPICAL

## 2013-11-07 MED ORDER — ROCURONIUM BROMIDE 100 MG/10ML IV SOLN
INTRAVENOUS | Status: DC | PRN
  Administered 2013-11-07: 30 mg via INTRAVENOUS

## 2013-11-07 MED ORDER — FENTANYL CITRATE 0.05 MG/ML IJ SOLN
INTRAMUSCULAR | Status: DC | PRN
  Administered 2013-11-07 (×2): 25 ug via INTRAVENOUS
  Administered 2013-11-07: 100 ug via INTRAVENOUS

## 2013-11-07 MED ORDER — DEXTROSE 5 % IV SOLN: 1.5000 g | INTRAVENOUS | Status: DC

## 2013-11-07 MED ORDER — LABETALOL HCL 5 MG/ML IV SOLN
5.0000 mg | Freq: Once | INTRAVENOUS | Status: AC
  Administered 2013-11-07: 5 mg via INTRAVENOUS

## 2013-11-07 MED ORDER — STERILE WATER FOR INJECTION IJ SOLN
INTRAMUSCULAR | Status: AC
  Filled 2013-11-07: qty 10

## 2013-11-07 MED ORDER — POTASSIUM CHLORIDE 10 MEQ/50ML IV SOLN
10.0000 meq | Freq: Every day | INTRAVENOUS | Status: DC | PRN
  Filled 2013-11-07: qty 50

## 2013-11-07 MED ORDER — ARTIFICIAL TEARS OP OINT
TOPICAL_OINTMENT | OPHTHALMIC | Status: DC | PRN
  Administered 2013-11-07: 1 via OPHTHALMIC

## 2013-11-07 MED ORDER — PHENYLEPHRINE HCL 10 MG/ML IJ SOLN
10.0000 mg | INTRAVENOUS | Status: DC | PRN
  Administered 2013-11-07: 15 ug/min via INTRAVENOUS

## 2013-11-07 MED ORDER — POTASSIUM CHLORIDE 2 MEQ/ML IV SOLN
80.0000 meq | INTRAVENOUS | Status: DC
  Filled 2013-11-07: qty 40

## 2013-11-07 MED ORDER — SODIUM CHLORIDE 0.9 % IV SOLN
500.0000 mg | INTRAVENOUS | Status: DC
  Filled 2013-11-07: qty 500

## 2013-11-07 MED ORDER — ACETAMINOPHEN 500 MG PO TABS
1000.0000 mg | ORAL_TABLET | Freq: Four times a day (QID) | ORAL | Status: AC
  Administered 2013-11-08 – 2013-11-12 (×14): 1000 mg via ORAL
  Filled 2013-11-07 (×21): qty 2

## 2013-11-07 MED ORDER — SODIUM CHLORIDE 0.9 % IV SOLN
INTRAVENOUS | Status: DC
  Filled 2013-11-07: qty 1

## 2013-11-07 MED ORDER — FENTANYL CITRATE 0.05 MG/ML IJ SOLN
25.0000 ug | INTRAMUSCULAR | Status: DC | PRN
  Administered 2013-11-07 – 2013-11-13 (×2): 25 ug via INTRAVENOUS
  Filled 2013-11-07 (×2): qty 2

## 2013-11-07 MED ORDER — DEXTROSE 5 % IV SOLN
1.5000 g | INTRAVENOUS | Status: DC
  Filled 2013-11-07: qty 1.5

## 2013-11-07 MED ORDER — CISATRACURIUM BESYLATE 10 MG/ML IV SOLN
INTRAVENOUS | Status: AC
  Filled 2013-11-07: qty 20

## 2013-11-07 MED ORDER — DOPAMINE-DEXTROSE 3.2-5 MG/ML-% IV SOLN
2.0000 ug/kg/min | INTRAVENOUS | Status: DC
  Filled 2013-11-07: qty 250

## 2013-11-07 MED ORDER — ONDANSETRON HCL 4 MG/2ML IJ SOLN
4.0000 mg | Freq: Once | INTRAMUSCULAR | Status: AC | PRN
  Administered 2013-11-07: 4 mg via INTRAVENOUS

## 2013-11-07 MED ORDER — ETOMIDATE 2 MG/ML IV SOLN
INTRAVENOUS | Status: DC | PRN
  Administered 2013-11-07: 12 mg via INTRAVENOUS

## 2013-11-07 MED ORDER — LACTATED RINGERS IV SOLN
INTRAVENOUS | Status: DC | PRN
  Administered 2013-11-07: 14:00:00 via INTRAVENOUS

## 2013-11-07 MED ORDER — DILTIAZEM HCL 100 MG IV SOLR
5.0000 mg/h | INTRAVENOUS | Status: DC
  Administered 2013-11-07: 5 mg/h via INTRAVENOUS
  Filled 2013-11-07 (×2): qty 100

## 2013-11-07 MED ORDER — FENTANYL CITRATE 0.05 MG/ML IJ SOLN
INTRAMUSCULAR | Status: AC
  Administered 2013-11-07: 25 ug via INTRAVENOUS
  Filled 2013-11-07: qty 2

## 2013-11-07 MED ORDER — ONDANSETRON HCL 4 MG/2ML IJ SOLN
4.0000 mg | Freq: Four times a day (QID) | INTRAMUSCULAR | Status: DC | PRN
  Administered 2013-11-09 – 2013-11-10 (×3): 4 mg via INTRAVENOUS
  Filled 2013-11-07 (×3): qty 2

## 2013-11-07 MED ORDER — ACETAMINOPHEN 160 MG/5ML PO SOLN
1000.0000 mg | Freq: Four times a day (QID) | ORAL | Status: AC
  Filled 2013-11-07: qty 40

## 2013-11-07 MED ORDER — EPHEDRINE SULFATE 50 MG/ML IJ SOLN
INTRAMUSCULAR | Status: AC
  Filled 2013-11-07: qty 1

## 2013-11-07 MED ORDER — DEXTROSE 5 % IV SOLN
750.0000 mg | INTRAVENOUS | Status: DC
  Filled 2013-11-07 (×2): qty 750

## 2013-11-07 MED ORDER — NEOSTIGMINE METHYLSULFATE 10 MG/10ML IV SOLN
INTRAVENOUS | Status: DC | PRN
  Administered 2013-11-07: 4 mg via INTRAVENOUS

## 2013-11-07 MED ORDER — PHENYLEPHRINE HCL 10 MG/ML IJ SOLN
30.0000 ug/min | INTRAVENOUS | Status: DC
  Filled 2013-11-07: qty 2

## 2013-11-07 MED ORDER — NEOSTIGMINE METHYLSULFATE 10 MG/10ML IV SOLN
INTRAVENOUS | Status: AC
  Filled 2013-11-07: qty 1

## 2013-11-07 MED ORDER — SUCCINYLCHOLINE CHLORIDE 20 MG/ML IJ SOLN
INTRAMUSCULAR | Status: DC | PRN
  Administered 2013-11-07: 80 mg via INTRAVENOUS

## 2013-11-07 MED ORDER — MIDAZOLAM HCL 2 MG/2ML IJ SOLN
INTRAMUSCULAR | Status: AC
  Filled 2013-11-07: qty 2

## 2013-11-07 SURGICAL SUPPLY — 43 items
BENZOIN TINCTURE PRP APPL 2/3 (GAUZE/BANDAGES/DRESSINGS) IMPLANT
CANISTER SUCTION 2500CC (MISCELLANEOUS) ×2 IMPLANT
CATH THORACIC 28FR (CATHETERS) IMPLANT
CATH THORACIC 28FR RT ANG (CATHETERS) IMPLANT
CATH THORACIC 36FR (CATHETERS) IMPLANT
CATH THORACIC 36FR RT ANG (CATHETERS) IMPLANT
CONN ST 1/4X3/8  BEN (MISCELLANEOUS) ×1
CONN ST 1/4X3/8 BEN (MISCELLANEOUS) ×1 IMPLANT
CONT SPEC 4OZ CLIKSEAL STRL BL (MISCELLANEOUS) ×4 IMPLANT
COVER SURGICAL LIGHT HANDLE (MISCELLANEOUS) ×2 IMPLANT
DERMABOND ADVANCED (GAUZE/BANDAGES/DRESSINGS) ×1
DERMABOND ADVANCED .7 DNX12 (GAUZE/BANDAGES/DRESSINGS) ×1 IMPLANT
DRAIN CHANNEL 28F RND 3/8 FF (WOUND CARE) IMPLANT
DRAPE LAPAROSCOPIC ABDOMINAL (DRAPES) ×2 IMPLANT
ELECT REM PT RETURN 9FT ADLT (ELECTROSURGICAL) ×2
ELECTRODE REM PT RTRN 9FT ADLT (ELECTROSURGICAL) ×1 IMPLANT
GAUZE SPONGE 4X4 12PLY STRL (GAUZE/BANDAGES/DRESSINGS) IMPLANT
GLOVE BIO SURGEON STRL SZ 6.5 (GLOVE) ×4 IMPLANT
HEMOSTAT POWDER SURGIFOAM 1G (HEMOSTASIS) IMPLANT
KIT BASIN OR (CUSTOM PROCEDURE TRAY) ×2 IMPLANT
KIT ROOM TURNOVER OR (KITS) ×2 IMPLANT
NS IRRIG 1000ML POUR BTL (IV SOLUTION) ×4 IMPLANT
PACK CHEST (CUSTOM PROCEDURE TRAY) ×2 IMPLANT
PAD ARMBOARD 7.5X6 YLW CONV (MISCELLANEOUS) ×4 IMPLANT
PAD ELECT DEFIB RADIOL ZOLL (MISCELLANEOUS) ×2 IMPLANT
SPONGE GAUZE 4X4 12PLY STER LF (GAUZE/BANDAGES/DRESSINGS) ×2 IMPLANT
STRIP CLOSURE SKIN 1/2X4 (GAUZE/BANDAGES/DRESSINGS) IMPLANT
SUT SILK  1 MH (SUTURE) ×1
SUT SILK 1 MH (SUTURE) ×1 IMPLANT
SUT VIC AB 1 CTX 18 (SUTURE) ×2 IMPLANT
SUT VIC AB 2-0 CTX 27 (SUTURE) ×2 IMPLANT
SUT VIC AB 3-0 X1 27 (SUTURE) ×2 IMPLANT
SWAB COLLECTION DEVICE MRSA (MISCELLANEOUS) IMPLANT
SYR 50ML SLIP (SYRINGE) IMPLANT
SYRINGE 10CC LL (SYRINGE) IMPLANT
SYSTEM SAHARA CHEST DRAIN ATS (WOUND CARE) ×2 IMPLANT
TAPE CLOTH SURG 4X10 WHT LF (GAUZE/BANDAGES/DRESSINGS) ×2 IMPLANT
TOWEL OR 17X24 6PK STRL BLUE (TOWEL DISPOSABLE) ×2 IMPLANT
TOWEL OR 17X26 10 PK STRL BLUE (TOWEL DISPOSABLE) ×2 IMPLANT
TRAP SPECIMEN MUCOUS 40CC (MISCELLANEOUS) ×6 IMPLANT
TRAY FOLEY CATH 14FRSI W/METER (CATHETERS) ×2 IMPLANT
TUBE ANAEROBIC SPECIMEN COL (MISCELLANEOUS) IMPLANT
WATER STERILE IRR 1000ML POUR (IV SOLUTION) ×4 IMPLANT

## 2013-11-07 NOTE — Addendum Note (Signed)
Addendum created 11/07/13 1917 by Kipp Brood, MD   Modules edited: Clinical Notes   Clinical Notes:  File: [id number]; Pend: [id number]; Pend: [id number]; Beverely Low: [id number]; Pend: [id number]

## 2013-11-07 NOTE — CV Procedure (Signed)
Intra-operative Transesophageal Echocardiography Report:  Mr. [name] is a 80 year old male a history of pericarditis and pericardial effusion. He was admitted to the hospital on 11/01/2013 and was found to have a large pericardial effusion without tamponade. This was drained percutaneously and 750 cc of fluid was removed. He subsequently developed rapid atrial fibrillation and a repeat echocardiogram 2 days later revealed reaccumulation of pericardial fluid. He is now scheduled to undergo subxiphoid pericardial window by Dr. Tyrone Sage. Intraoperative transesophageal echocardiography was indicated to evaluate the pericardial effusion to assess for adequacy of drainage and to serve as a monitor for light right and left ventricular function.  The patient was brought to the operating room at [facility] and general anesthesia was induced without difficulty. Following endotracheal intubation and orogastric suctioning, the transesophageal echocardiography probe was inserted into the esophagus without difficulty.  Impression:   1. Pericardial space: There was a large pericardial effusion noted. This appeared primarily posterior to the heart. The effusion measured 2.56 cm in diameter and the posterior lateral region and 2.0 cm in the posterior basilar area. There was some and diastolic invagination of the right atrium and right ventricle but no obvious diastolic collapse. There  were fibrinous strands noted within the pericardial effusion. Following intra-operative drainage of the pericardial effusion, no residual pericardial fluid could be appreciated.  2. Aortic valve: The aortic valve was moderately thickened but opened normally and there was trace aortic insufficiency.  3. Mitral valve: Mitral leaflets appeared to coapt normally and there was trace mitral insufficiency. There was no restriction of opening of the mitral valve and no prolapse or flail segments noted.   4. Left ventricle:  Prior to drainage of the effusion, the left ventricular cavity was underfilled and it was difficult to evaluate LV function. Following drainage of the effusion there appeared to be mild to moderate left ventricular dysfunction with global LV hypokinesis with an ejection fraction estimated at 40-45%. Left ventricular end-diastolic diameter measured 5.2 cm.  5. Right ventricle: The right ventricular cavity was underfilled with some diastolic invagination prior to drainage of the effusion. Following drainage of the effusion the right ventricular function appeared normal with normal right ventricular size and adequate contractility the right ventricular free wall.  6. Tricuspid valve: The tricuspid valve appeared structurally normal with trace tricuspid insufficiency following drainage of the effusion.  7. Interatrial septum: The interatrial septum appeared intact without evidence of patent foramen ovale or atrial septal defect by color Doppler or bubble study.  8. Left atrium: With careful evaluation of the left atrial appendage, there appeared to be a possible thrombus or mass within the left atrial appendage. The patient's cardiology team was then notified of this finding.  9. Ascending aorta: The  ascending aorta appeared normal in diameter and there was a well-defined aortic root and sinotubular ridge without effacement or dilatation. There was no significant atheromatous disease appreciated within the ascending aorta.  Kipp Brood, MD

## 2013-11-07 NOTE — Anesthesia Preprocedure Evaluation (Addendum)
Anesthesia Evaluation  Patient identified by MRN, date of birth, ID band Patient awake    Reviewed: Allergy & Precautions, H&P , NPO status , Patient's Chart, lab work & pertinent test results  Airway Mallampati: II TM Distance: >3 FB Neck ROM: Full    Dental  (+) Edentulous Upper, Edentulous Lower, Dental Advisory Given   Pulmonary          Cardiovascular hypertension, Rhythm:Regular Rate:Tachycardia     Neuro/Psych    GI/Hepatic   Endo/Other  diabetes  Renal/GU      Musculoskeletal   Abdominal   Peds  Hematology   Anesthesia Other Findings   Reproductive/Obstetrics                         Anesthesia Physical Anesthesia Plan  ASA: III  Anesthesia Plan: General   Post-op Pain Management:    Induction: Intravenous  Airway Management Planned: Oral ETT  Additional Equipment: TEE, Arterial line, CVP and 3D TEE  Intra-op Plan:   Post-operative Plan: Extubation in OR  Informed Consent: I have reviewed the patients History and Physical, chart, labs and discussed the procedure including the risks, benefits and alternatives for the proposed anesthesia with the patient or authorized representative who has indicated his/her understanding and acceptance.     Plan Discussed with: CRNA and Anesthesiologist  Anesthesia Plan Comments:         Anesthesia Quick Evaluation

## 2013-11-07 NOTE — Progress Notes (Signed)
EVENING ROUNDS NOTE :     [address]       [name] [address]             [phone]                 Day of Surgery Procedure(s) (LRB): SUBXYPHOID PERICARDIAL WINDOW (N/A)  Total Length of Stay:  LOS: 6 days  BP 132/58  Pulse 81  Temp(Src) 98.2 F (36.8 C) (Oral)  Resp 19  Ht 5\' 7"  (1.702 m)  Wt 170 lb 11.2 oz (77.429 kg)  BMI 26.73 kg/m2  SpO2 100%  .Intake/Output     08/06 0701 - 08/07 0700 08/07 0701 - 08/08 0700   P.O. 480 240   I.V. (mL/kg)  850 (11)   Total Intake(mL/kg) 480 (6.2) 1090 (14.1)   Urine (mL/kg/hr) 1150 (0.6) 550 (0.6)   Other  500 (0.5)   Stool     Chest Tube  30 (0)   Total Output 1150 1080   Net -670 +10          . sodium chloride 0  (11/05/13 1257)  . sodium chloride 50 mL/hr at 11/07/13 0644  . diltiazem (CARDIZEM) infusion 5 mg/hr (11/07/13 1838)     Lab Results  Component Value Date   WBC 4.7 11/07/2013   HGB 9.2* 11/07/2013   HCT 27.0* 11/07/2013   PLT 183 11/07/2013   GLUCOSE 129* 11/07/2013   CHOL 83 10/01/2013   TRIG 58 10/01/2013   HDL 29* 10/01/2013   LDLCALC 42 10/01/2013   ALT 15 09/23/2013   AST 16 09/23/2013   NA 139 11/07/2013   K 4.1 11/07/2013   CL 103 11/07/2013   CREATININE 1.40* 11/07/2013   BUN 27* 11/07/2013   CO2 24 11/03/2013   TSH 1.280 09/30/2013   INR 1.32 11/03/2013   HGBA1C 5.9* 11/02/2013   Developed afib in rr Now in ICU Awake alert   Delight Ovens MD  Beeper [phone] Office [phone] 11/07/2013 6:55 PM

## 2013-11-07 NOTE — Anesthesia Postprocedure Evaluation (Signed)
  Anesthesia Post-op Note  Patient: [name]  Procedure(s) Performed: Procedure(s): SUBXYPHOID PERICARDIAL WINDOW (N/A)  Patient Location: PACU  Anesthesia Type:General  Level of Consciousness: awake, alert  and oriented  Airway and Oxygen Therapy: Patient Spontanous Breathing and Patient connected to nasal cannula oxygen  Post-op Pain: mild  Post-op Assessment: Post-op Vital signs reviewed, Patient's Cardiovascular Status Stable, Respiratory Function Stable, Patent Airway and Pain level controlled  Post-op Vital Signs: stable  Last Vitals:  Filed Vitals:   11/07/13 1700  BP: 156/63  Pulse: 81  Temp:   Resp: 21    Complications: No apparent anesthesia complications

## 2013-11-07 NOTE — Anesthesia Procedure Notes (Signed)
Procedure Name: Intubation Date/Time: 11/07/2013 2:16 PM Performed by: Delia Chimes E Pre-anesthesia Checklist: Patient identified, Timeout performed, Emergency Drugs available, Suction available and Patient being monitored Patient Re-evaluated:Patient Re-evaluated prior to inductionOxygen Delivery Method: Circle system utilized Preoxygenation: Pre-oxygenation with 100% oxygen Intubation Type: IV induction Ventilation: Mask ventilation without difficulty and Oral airway inserted - appropriate to patient size Laryngoscope Size: Mac and 3 Grade View: Grade I Tube type: Oral Tube size: 7.5 mm Number of attempts: 1 Airway Equipment and Method: Stylet and Oral airway Placement Confirmation: ETT inserted through vocal cords under direct vision,  positive ETCO2 and breath sounds checked- equal and bilateral Secured at: 21 cm Tube secured with: Tape Dental Injury: Teeth and Oropharynx as per pre-operative assessment

## 2013-11-07 NOTE — Progress Notes (Signed)
Subjective: No chest pain, no SOB  Objective: Vital signs in last 24 hours: Temp:  [97.9 F (36.6 C)-99.1 F (37.3 C)] 98.7 F (37.1 C) (08/07 0801) Pulse Rate:  [71-83] 71 (08/07 0801) Resp:  [18] 18 (08/07 0801) BP: (151-163)/(56-75) 163/65 mmHg (08/07 0801) SpO2:  [98 %-100 %] 99 % (08/07 0801) Weight:  [170 lb 11.2 oz (77.429 kg)] 170 lb 11.2 oz (77.429 kg) (08/07 0424) Weight change: -1 lb 4.1 oz (-0.571 kg) Last BM Date: 11/06/13 Intake/Output from previous day: -670 08/06 0701 - 08/07 0700 In: 480 [P.O.:480] Out: 1150 [Urine:1150] Intake/Output this shift: Total I/O In: 240 [P.O.:240] Out: 350 [Urine:350]  PE: General:Pleasant affect, NAD Skin:Warm and dry, brisk capillary refill HEENT:normocephalic, sclera clear, mucus membranes moist Heart:S1S2 RRR without murmur, gallup, rub or click Lungs:clear without rales, rhonchi, or wheezes ZOX:WRUEAbd:soft, non tender, + BS, do not palpate liver spleen or masses Ext:no lower ext edema, lt foot wrapped Neuro:alert and oriented, MAE, follows commands, + facial symmetry  Tele SR   Lab Results:  Recent Labs  11/06/13 0612  WBC 5.8  HGB 9.0*  HCT 26.9*  PLT 157   BMET No results found for this basename: NA, K, CL, CO2, GLUCOSE, BUN, CREATININE, CALCIUM, MAGNESIUM,  in the last 72 hours No results found for this basename: TROPONINI, CK, MB,  in the last 72 hours  Lab Results  Component Value Date   CHOL 83 10/01/2013   HDL 29* 10/01/2013   LDLCALC 42 10/01/2013   TRIG 58 10/01/2013   CHOLHDL 2.9 10/01/2013   Lab Results  Component Value Date   HGBA1C 5.9* 11/02/2013     Lab Results  Component Value Date   TSH 1.280 09/30/2013     Studies/Results: ECHO:  11/06/13 Procedure narrative: Limited study to assess for pericardial effusion. - Pericardium, extracardiac: Moderate circumferential pericardial effusion, worse inferiorly. There is no significant inflow variation in the mitral and tricuspid valves. There  is diastolic collapse of the RA and RV with a plethoric IVC. This could be consistent with tamponade physiology. Clinical correlation is advised. Impressions: - Compared to the prior echo yesterday, there does not appear to be significant change in the effusion. Some features of tamponade physiology are present. Clinical correlation is advised.  Pericardium: Moderate circumferential pericardial effusion, worse inferiorly. There is no significant inflow variation in the mitral and tricuspid valves. There is diastolic collapse of the RA and RV with a plethoric IVC. This could be consistent with tamponade physiology. Clinical correlation is advised   Medications: I have reviewed the patient's current medications. Scheduled Meds: . aspirin EC  325 mg Oral Daily  . docusate sodium  100 mg Oral BID  . doxycycline  100 mg Oral Q12H  . ferrous sulfate  325 mg Oral TID WC  . insulin aspart  0-5 Units Subcutaneous QHS  . insulin aspart  0-9 Units Subcutaneous TID WC  . simvastatin  10 mg Oral Q lunch  . sodium chloride  3 mL Intravenous Q12H   Continuous Infusions: . sodium chloride Stopped (11/05/13 1257)  . sodium chloride 50 mL/hr at 11/07/13 0644   PRN Meds:.acetaminophen, acetaminophen, alum & mag hydroxide-simeth, HYDROmorphone (DILAUDID) injection, ondansetron (ZOFRAN) IV, ondansetron, oxyCODONE  Assessment/Plan: Chest pain - improved, ez negative for MI, felt secondary to peric effusion   Pericardial effusion - s/p /p pericardiocentesis effusion now w/ tamponade, 750 cc drained 08/03  some reaccumulation seen on echocardiogram 08/06  See results  Exudative by protein level, few WBCs and no organisms seen, reports are incomplete. Continue to follow. Echo 11/05/13 Pericardium, extracardiac: There is a moderate circumferential pericardial effusion without tamponade similar to echo 11/04/13-- if significant additional re-accumulation today would recommend pericardial window.  Will have Dr.  Dorris Fetch see back.  Atrial fibrillation, rapid ventricular response - rate is improved on IV Cardizem. Blood pressure is stable. Can convert to by mouth Cardizem per M.D.. Time of onset is clearly documented, currently less than 24 hours. --Pt converted to SR at 1945 last evening with 6 sec pause. dilt drip stopped Maintaining SR at 74.   Anticoagulation - CHADsVASC score is elevated but with pericardial effusion and low platelets, not sure he is an anticoagulation candidate so no changes for now.      LOS: 6 days   Time spent with pt. : 15 minutes. [facility]  Nurse Practitioner Certified Pager [phone] or after 5pm and on weekends call [phone] 11/07/2013, 10:38 AM    Patient seen and examined. Agree with assessment and plan. Continues to be asymptomatic. F/U echo yesterday now suggests some features of tamponade physiology. No pleuritic chest pain. Will check for pulsus paradox. Dr. Dorris Fetch had seen patient prior to pericardiocentesis; will ask to re-evaluate for pericardial window.   Lennette Bihari, MD, [facility] 11/07/2013 11:05 AM
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
## 2013-11-07 NOTE — Brief Op Note (Addendum)
11/01/2013 - 11/07/2013  3:03 PM  PATIENT:  [name]  80 y.o. male  PRE-OPERATIVE DIAGNOSIS:  Pericardial Effusion  POST-OPERATIVE DIAGNOSIS:  Pericardial Effusion  PROCEDURE: SUBXYPHOID PERICARDIAL WINDOW   FINDINGS:Approximately 500 cc of yellowish fluid removed. Also, upon TEE, a left atrial appendage  thrombus was also seen. Dr. Tresa Endo was notified  SURGEON:  Surgeon(s) and Role:    * Delight Ovens, MD - Primary  PHYSICIAN ASSISTANT: Doree Fudge PA-C  ANESTHESIA:   general  EBL:  Total I/O In: 240 [P.O.:240] Out: 450 [Urine:450]   DRAINS: One Blake Drain in the pericardial space   SPECIMEN:  Source of Specimen:  Pericardial biopsy;Cultures and cytology also sent  DISPOSITION OF SPECIMEN:  PATHOLOGY  COUNTS CORRECT:  YES  DICTATION: .Dragon Dictation  PLAN OF CARE: Admit to inpatient   PATIENT DISPOSITION:  ICU - extubated and stable.   Delay start of Pharmacological VTE agent (>24hrs) due to surgical blood loss or risk of bleeding: yes

## 2013-11-07 NOTE — Transfer of Care (Signed)
Immediate Anesthesia Transfer of Care Note  Patient: [name]  Procedure(s) Performed: Procedure(s): SUBXYPHOID PERICARDIAL WINDOW (N/A)  Patient Location: PACU  Anesthesia Type:General  Level of Consciousness: awake, alert  and oriented  Airway & Oxygen Therapy: Patient Spontanous Breathing and Patient connected to nasal cannula oxygen  Post-op Assessment: Report given to PACU RN and Post -op Vital signs reviewed and stable  Post vital signs: Reviewed and stable  Complications: No apparent anesthesia complications

## 2013-11-07 NOTE — Progress Notes (Addendum)
[facility] PROGRESS NOTE  Vista LawmanRobert White ZOX:[id number] DOB: 01/08/1934 DOA: 11/01/2013 PCP: Dorrene GermanAVBUERE,EDWIN A, MD  Assessment/Plan: 80 y.o. male with a history of a Previous CVA without Residual Deficits, HTN, DM2, and Stage III CKD who presents to the ED with complaints of intermittent Chest Pain and palpitations, found to have pericardial effusion    1. Pericardial effusion -2-D echo showed pericardial effusion with moderate tamponade -s/p pericardiocentesis effusion now w/ tamponade, 750 cc drained 08/03 (no organisms, mesothelial cells) -8/4-->8/5-->8/6: repeat echo: Moderatecircumferential effusion signs of tamponade. Per cardiology ? pericardial window -CT surgery also consulted, appreciate consult from Dr. Dorris FetchHendrickson   -PAF 8/4; off cardizem; on ASA;  [facility] to [facility], appreciate th input  2. Chest pain, Patient was ruled out for acute ACS, denied any specific chest pain 3. Diabetes mellitus uncontrolled complicated with diabetic neuropathy, chronic left foot ulcer  - Hemoglobin A1c 5.9, continue sliding scale insulin  4. Chronic diabetic left foot ulcer: Follows Dr. Lajoyce Cornersuda  - Has Roland RackUnna boot dressing, weeping, hence placed on doxycycline,  - wound care consult, dressing change done on 8/3  5. Hypertension: borderline high; start BB if tolerated;  6. Diarrhea improved C. Difficile negative  7. Scrotal edema with inguinal hernia  - Scrotal ultrasound showed bowel containing left inguinal hernia admitting bowel into the scrotal sac.  - Surgery was consulted and recommended outpatient elective repair  8. Thrombocytopenia; Lovenox discontinued -resolved; cont SCD 9. CKD (chronic kidney disease) stage 3, GFR 30-59 ml/min; gentle hydration, baseline Cr 1.4-1.6, cr at baseline  10. Anemia: Appears to be chronic, no precipitous drop, likely decrease from hemodilution   DVT prophylaxis: SCD; holding heparin due to thrombocytopenia, pericardial effusion    -recheck platelets in AM; if stable; ? Resume heparin   Code Status: full Family Communication:  D/w patient, called updated Chestnut,Mark Son [phone]   (indicate person spoken with, relationship, and if by phone, the number) Disposition Plan: pend clinical improvement    Consultants:  Cardiology  Procedures:  2-D echo  Pericardiocentesis Antibiotics:  Doxycycline   HPI/Subjective: alert  Objective: Filed Vitals:   11/07/13 0801  BP: 163/65  Pulse: 71  Temp: 98.7 F (37.1 C)  Resp: 18    Intake/Output Summary (Last 24 hours) at 11/07/13 0930 Last data filed at 11/07/13 0431  Gross per 24 hour  Intake    240 ml  Output   1150 ml  Net   -910 ml   Filed Weights   11/04/13 0400 11/06/13 0400 11/07/13 0424  Weight: 78.2 kg (172 lb 6.4 oz) 78 kg (171 lb 15.3 oz) 77.429 kg (170 lb 11.2 oz)    Exam:   General:  alert  Cardiovascular: s1,s2 irregular   Respiratory: CTA BL  Abdomen: soft, nt,nd   Musculoskeletal: L foot dressed   Data Reviewed: Basic Metabolic Panel:  Recent Labs Lab 11/01/13 2221 11/02/13 0445 11/03/13 0510  NA 134* 135* 140  K 4.3 4.0 4.2  CL 95* 100 106  CO2 24 24 24   GLUCOSE 146* 180* 161*  BUN 43* 42* 38*  CREATININE 1.49* 1.63* 1.56*  CALCIUM 9.0 8.6 8.4  MG  --  2.0  --    Liver Function Tests: No results found for this basename: AST, ALT, ALKPHOS, BILITOT, PROT, ALBUMIN,  in the last 168 hours No results found for this basename: LIPASE, AMYLASE,  in the last 168 hours No results found for this basename: AMMONIA,  in the last 168 hours CBC:  Recent Labs Lab 11/01/13 2221 11/02/13 0445 11/03/13 0510 11/06/13 0612  WBC 7.7 7.2 4.9 5.8  HGB 10.4* 9.4* 8.9* 9.0*  HCT 31.1* 28.5* 27.5* 26.9*  MCV 85.0 86.6 85.9 84.9  PLT 126* PLATELET CLUMPS NOTED ON SMEAR, COUNT APPEARS DECREASED 92* 157   Cardiac Enzymes:  Recent Labs Lab 11/02/13 0100 11/02/13 1005 11/02/13 1504 11/02/13 2059  TROPONINI <0.30 <0.30  <0.30 <0.30   BNP (last 3 results) No results found for this basename: PROBNP,  in the last 8760 hours CBG:  Recent Labs Lab 11/06/13 0757 11/06/13 1140 11/06/13 1717 11/06/13 2040 11/07/13 0751  GLUCAP 158* 113* 147* 139* 118*    Recent Results (from the past 240 hour(s))  CLOSTRIDIUM DIFFICILE BY PCR     Status: None   Collection Time    11/02/13  9:17 AM      Result Value Ref Range Status   C difficile by pcr NEGATIVE  NEGATIVE Final  BODY FLUID CULTURE     Status: None   Collection Time    11/03/13 11:50 AM      Result Value Ref Range Status   Specimen Description FLUID PERICARDIAL   Final   Special Requests NONE   Final   Gram Stain     Final   Value: RARE WBC PRESENT, PREDOMINANTLY MONONUCLEAR     NO ORGANISMS SEEN     Performed at [facility]   Culture     Final   Value: NO GROWTH 3 DAYS     Performed at [facility]   Report Status 11/06/2013 FINAL   Final     Studies: No results found.  Scheduled Meds: . aspirin EC  325 mg Oral Daily  . docusate sodium  100 mg Oral BID  . doxycycline  100 mg Oral Q12H  . ferrous sulfate  325 mg Oral TID WC  . insulin aspart  0-5 Units Subcutaneous QHS  . insulin aspart  0-9 Units Subcutaneous TID WC  . simvastatin  10 mg Oral Q lunch  . sodium chloride  3 mL Intravenous Q12H   Continuous Infusions: . sodium chloride Stopped (11/05/13 1257)  . sodium chloride 50 mL/hr at 11/07/13 6063    Principal Problem:   Chest pain Active Problems:   Diabetes mellitus without complication   Hypertension   CKD (chronic kidney disease) stage 3, GFR 30-59 ml/min   Anemia of chronic disease   PAD (peripheral artery disease)   Pericardial effusion   Ulcer of foot, chronic   Thrombocytopenia   Diarrhea   Scrotal edema   Atrial fibrillation with rapid ventricular response    Time spent: >35 minutes     Esperanza Sheets  [facility] Pager [phone]. If 7PM-7AM, please contact night-coverage at  [email or web address], password [email or web address] 11/07/2013, 9:30 AM  LOS: 6 days

## 2013-11-07 NOTE — Progress Notes (Signed)
Received pt from pacu .placed on monitor in afib rate 120 cardizem infusing at 5cc/hr

## 2013-11-08 LAB — CBC
HEMATOCRIT: 26.6 % — AB (ref 39.0–52.0)
Hemoglobin: 8.7 g/dL — ABNORMAL LOW (ref 13.0–17.0)
MCH: 27.6 pg (ref 26.0–34.0)
MCHC: 32.7 g/dL (ref 30.0–36.0)
MCV: 84.4 fL (ref 78.0–100.0)
Platelets: 189 10*3/uL (ref 150–400)
RBC: 3.15 MIL/uL — ABNORMAL LOW (ref 4.22–5.81)
RDW: 15.4 % (ref 11.5–15.5)
WBC: 5.2 10*3/uL (ref 4.0–10.5)

## 2013-11-08 LAB — BASIC METABOLIC PANEL
Anion gap: 13 (ref 5–15)
BUN: 27 mg/dL — AB (ref 6–23)
CO2: 20 meq/L (ref 19–32)
CREATININE: 1.24 mg/dL (ref 0.50–1.35)
Calcium: 8 mg/dL — ABNORMAL LOW (ref 8.4–10.5)
Chloride: 105 mEq/L (ref 96–112)
GFR calc non Af Amer: 54 mL/min — ABNORMAL LOW (ref >90)
GFR, EST AFRICAN AMERICAN: 62 mL/min — AB (ref >90)
Glucose, Bld: 125 mg/dL — ABNORMAL HIGH (ref 70–99)
Potassium: 5 mEq/L (ref 3.7–5.3)
Sodium: 138 mEq/L (ref 137–147)

## 2013-11-08 LAB — POCT I-STAT 3, ART BLOOD GAS (G3+)
Acid-base deficit: 3 mmol/L — ABNORMAL HIGH (ref 0.0–2.0)
BICARBONATE: 21.8 meq/L (ref 20.0–24.0)
O2 Saturation: 95 %
PO2 ART: 81 mmHg (ref 80.0–100.0)
Patient temperature: 38
TCO2: 23 mmol/L (ref 0–100)
pCO2 arterial: 37.3 mmHg (ref 35.0–45.0)
pH, Arterial: 7.378 (ref 7.350–7.450)

## 2013-11-08 LAB — GLUCOSE, CAPILLARY
GLUCOSE-CAPILLARY: 128 mg/dL — AB (ref 70–99)
GLUCOSE-CAPILLARY: 124 mg/dL — AB (ref 70–99)
GLUCOSE-CAPILLARY: 134 mg/dL — AB (ref 70–99)
Glucose-Capillary: 133 mg/dL — ABNORMAL HIGH (ref 70–99)

## 2013-11-08 LAB — MAGNESIUM: MAGNESIUM: 1.9 mg/dL (ref 1.5–2.5)

## 2013-11-08 MED ORDER — AMIODARONE HCL IN DEXTROSE 360-4.14 MG/200ML-% IV SOLN
60.0000 mg/h | INTRAVENOUS | Status: AC
  Administered 2013-11-08: 60 mg/h via INTRAVENOUS
  Filled 2013-11-08: qty 200

## 2013-11-08 MED ORDER — WARFARIN - PHARMACIST DOSING INPATIENT
Freq: Every day | Status: DC
  Administered 2013-11-08 – 2013-11-13 (×3)

## 2013-11-08 MED ORDER — AMIODARONE HCL IN DEXTROSE 360-4.14 MG/200ML-% IV SOLN
30.0000 mg/h | INTRAVENOUS | Status: DC
  Administered 2013-11-08 – 2013-11-10 (×4): 30 mg/h via INTRAVENOUS
  Filled 2013-11-08 (×9): qty 200

## 2013-11-08 MED ORDER — SODIUM CHLORIDE 0.9 % IV BOLUS (SEPSIS)
250.0000 mL | Freq: Once | INTRAVENOUS | Status: AC
  Administered 2013-11-08: 250 mL via INTRAVENOUS

## 2013-11-08 MED ORDER — WARFARIN SODIUM 5 MG PO TABS
5.0000 mg | ORAL_TABLET | Freq: Every day | ORAL | Status: DC
  Administered 2013-11-08 – 2013-11-09 (×2): 5 mg via ORAL
  Filled 2013-11-08 (×3): qty 1

## 2013-11-08 MED ORDER — ASPIRIN EC 81 MG PO TBEC
81.0000 mg | DELAYED_RELEASE_TABLET | Freq: Every day | ORAL | Status: DC
  Administered 2013-11-09 – 2013-11-13 (×5): 81 mg via ORAL
  Filled 2013-11-08 (×5): qty 1

## 2013-11-08 NOTE — Progress Notes (Signed)
ANTICOAGULATION CONSULT NOTE - Initial Consult  Pharmacy Consult for Coumadin Indication: atrial fibrillation, left atrial thrombus  No Known Allergies  Patient Measurements: Height: 5\' 7"  (170.2 cm) Weight: 174 lb 9.7 oz (79.2 kg) IBW/kg (Calculated) : 66.1  Vital Signs: Temp: 97.8 F (36.6 C) (08/08 0725) Temp src: Oral (08/08 0725) BP: 120/66 mmHg (08/08 1000) Pulse Rate: 135 (08/08 1000)  Labs:  Recent Labs  11/06/13 0612 11/07/13 1300 11/07/13 1340 11/08/13 0430  HGB 9.0* 8.9* 9.2* 8.7*  HCT 26.9* 26.7* 27.0* 26.6*  PLT 157 183  --  189  CREATININE  --   --  1.40* 1.24    Estimated Creatinine Clearance: 45.9 ml/min (by C-G formula based on Cr of 1.24).   Medical History: Past Medical History  Diagnosis Date  . Diabetes mellitus without complication   . Hypertension   . CKD (chronic kidney disease) stage 3, GFR 30-59 ml/min 08/01/2012  . Hypoglycemia 07/26/2012  . Anemia 08/01/2012  . CVA (cerebral vascular accident)   . Pericardial effusion     750 cc drained 11/03/2013  . Peripheral arterial disease     status post left SFA stenting by myself for critical limb ischemia 02/10/10    Assessment: 80 year old male to begin Coumadin for Afib and left atrial thrombus.  S/p pericardial window 8/7  Goal of Therapy:  INR 2-3 Monitor platelets by anticoagulation protocol: Yes   Plan:  1) Coumadin 5 mg po daily at 1800 pm 2) Daily INR 3) Coumadin education.  Thank you. Okey Regal, PharmD [phone]  11/08/2013,10:40 AM

## 2013-11-08 NOTE — Progress Notes (Signed)
11/07/13 ~2015: Cardizem at 5mg /hr.  Pt converted from a-fib to NSR with little ectopy.    11/08/13 0300: Cardizem turned off d/t increased amounts of ectopy.  Pt having PACs, multifocal PVCs, as well as bigeminy and trigeminy.    0400: Increased amounts of ectopy still occurring.  Will add a mag level to labs and send ASAP.  Will continue to monitor closely.

## 2013-11-08 NOTE — Progress Notes (Signed)
SUBJECTIVE:  No chest pain.  Breathing OK.  Feels his heart racing.   PHYSICAL EXAM Filed Vitals:   11/08/13 0616 11/08/13 0700 11/08/13 0725 11/08/13 0800  BP:  145/57  108/59  Pulse: 71 62  95  Temp:   97.8 F (36.6 C)   TempSrc:   Oral   Resp: 21 21  19   Height:      Weight:      SpO2: 94% 97%  94%   General:  No distress Lungs:  Clear Heart:  Tachycardic without rub Abdomen:  Positive bowel sounds, no rebound no guarding Extremities:  No edema Chest:  Drains in place  Neuro:  Nonfocal  LABS:  Results for orders placed during the hospital encounter of 11/01/13 (from the past 24 hour(s))  GLUCOSE, CAPILLARY     Status: Abnormal   Collection Time    11/07/13 11:23 AM      Result Value Ref Range   Glucose-Capillary 162 (*) 70 - 99 mg/dL   Comment 1 Notify RN    CBC     Status: Abnormal   Collection Time    11/07/13  1:00 PM      Result Value Ref Range   WBC 4.7  4.0 - 10.5 K/uL   RBC 3.20 (*) 4.22 - 5.81 MIL/uL   Hemoglobin 8.9 (*) 13.0 - 17.0 g/dL   HCT 10.2 (*) 72.5 - 36.6 %   MCV 83.4  78.0 - 100.0 fL   MCH 27.8  26.0 - 34.0 pg   MCHC 33.3  30.0 - 36.0 g/dL   RDW 44.0  34.7 - 42.5 %   Platelets 183  150 - 400 K/uL  TYPE AND SCREEN     Status: None   Collection Time    11/07/13  1:15 PM      Result Value Ref Range   ABO/RH(D) O POS     Antibody Screen NEG     Sample Expiration 11/10/2013     Unit Number [id number]     Blood Component Type RED CELLS,LR     Unit division 00     Status of Unit ALLOCATED     Transfusion Status OK TO TRANSFUSE     Crossmatch Result Compatible     Unit Number [id number]     Blood Component Type RED CELLS,LR     Unit division 00     Status of Unit ALLOCATED     Transfusion Status OK TO TRANSFUSE     Crossmatch Result Compatible    PREPARE RBC (CROSSMATCH)     Status: None   Collection Time    11/07/13  1:33 PM      Result Value Ref Range   Order Confirmation ORDER PROCESSED BY BLOOD BANK    POCT I-STAT,  CHEM 8     Status: Abnormal   Collection Time    11/07/13  1:40 PM      Result Value Ref Range   Sodium 139  137 - 147 mEq/L   Potassium 4.1  3.7 - 5.3 mEq/L   Chloride 103  96 - 112 mEq/L   BUN 27 (*) 6 - 23 mg/dL   Creatinine, Ser 6.01 (*) 0.50 - 1.35 mg/dL   Glucose, Bld 093 (*) 70 - 99 mg/dL   Calcium, Ion 2.35  5.73 - 1.30 mmol/L   TCO2 22  0 - 100 mmol/L   Hemoglobin 9.2 (*) 13.0 - 17.0 g/dL   HCT 22.0 (*) 25.4 - 27.0 %  PROTEIN, BODY FLUID     Status: None   Collection Time    11/07/13  2:45 PM      Result Value Ref Range   Total protein, fluid 3.7     Fluid Type-FTP PERICARDIAL    LACTATE DEHYDROGENASE, BODY FLUID     Status: Abnormal   Collection Time    11/07/13  2:45 PM      Result Value Ref Range   LD, Fluid 505 (*) 3 - 23 U/L   Fluid Type-FLDH PERICARDIAL    BODY FLUID CELL COUNT WITH DIFFERENTIAL     Status: Abnormal   Collection Time    11/07/13  2:53 PM      Result Value Ref Range   Fluid Type-FCT PERICARDIAL     Color, Fluid AMBER (*) YELLOW   Appearance, Fluid HAZY (*) CLEAR   WBC, Fluid 247  0 - 1000 cu mm   Neutrophil Count, Fluid 83 (*) 0 - 25 %   Lymphs, Fluid 1     Monocyte-Macrophage-Serous Fluid 16 (*) 50 - 90 %   Eos, Fluid 0     Other Cells, Fluid 0    GLUCOSE, CAPILLARY     Status: Abnormal   Collection Time    11/07/13  3:30 PM      Result Value Ref Range   Glucose-Capillary 103 (*) 70 - 99 mg/dL   Comment 1 Documented in Chart     Comment 2 Notify RN    GLUCOSE, CAPILLARY     Status: Abnormal   Collection Time    11/07/13 10:12 PM      Result Value Ref Range   Glucose-Capillary 126 (*) 70 - 99 mg/dL   Comment 1 Notify RN     Comment 2 Documented in Chart    CBC     Status: Abnormal   Collection Time    11/08/13  4:30 AM      Result Value Ref Range   WBC 5.2  4.0 - 10.5 K/uL   RBC 3.15 (*) 4.22 - 5.81 MIL/uL   Hemoglobin 8.7 (*) 13.0 - 17.0 g/dL   HCT 78.2 (*) 95.6 - 21.3 %   MCV 84.4  78.0 - 100.0 fL   MCH 27.6  26.0 - 34.0  pg   MCHC 32.7  30.0 - 36.0 g/dL   RDW 08.6  57.8 - 46.9 %   Platelets 189  150 - 400 K/uL  BASIC METABOLIC PANEL     Status: Abnormal   Collection Time    11/08/13  4:30 AM      Result Value Ref Range   Sodium 138  137 - 147 mEq/L   Potassium 5.0  3.7 - 5.3 mEq/L   Chloride 105  96 - 112 mEq/L   CO2 20  19 - 32 mEq/L   Glucose, Bld 125 (*) 70 - 99 mg/dL   BUN 27 (*) 6 - 23 mg/dL   Creatinine, Ser 6.29  0.50 - 1.35 mg/dL   Calcium 8.0 (*) 8.4 - 10.5 mg/dL   GFR calc non Af Amer 54 (*) >90 mL/min   GFR calc Af Amer 62 (*) >90 mL/min   Anion gap 13  5 - 15  MAGNESIUM     Status: None   Collection Time    11/08/13  4:30 AM      Result Value Ref Range   Magnesium 1.9  1.5 - 2.5 mg/dL  POCT I-STAT 3, ART BLOOD GAS (G3+)  Status: Abnormal   Collection Time    11/08/13  4:55 AM      Result Value Ref Range   pH, Arterial 7.378  7.350 - 7.450   pCO2 arterial 37.3  35.0 - 45.0 mmHg   pO2, Arterial 81.0  80.0 - 100.0 mmHg   Bicarbonate 21.8  20.0 - 24.0 mEq/L   TCO2 23  0 - 100 mmol/L   O2 Saturation 95.0     Acid-base deficit 3.0 (*) 0.0 - 2.0 mmol/L   Patient temperature 38.0 C     Collection site ARTERIAL LINE     Drawn by Operator     Sample type ARTERIAL    GLUCOSE, CAPILLARY     Status: Abnormal   Collection Time    11/08/13  7:23 AM      Result Value Ref Range   Glucose-Capillary 128 (*) 70 - 99 mg/dL   Comment 1 Notify RN      Intake/Output Summary (Last 24 hours) at 11/08/13 0953 Last data filed at 11/08/13 0800  Gross per 24 hour  Intake 3707.75 ml  Output   1410 ml  Net 2297.75 ml    ASSESSMENT AND PLAN:  ATRIAL FIB:  He is having paroxysms of atrial fib with rapid rate and intermittent sinus bradycardia.  I will stop the IV Cardizem and start IV amiodarone.  He will likely need at least short term oral amio as an out patient.   PERICARDIAL EFFUSION:  Status post pericardial window.    LEFT ATRIAL THROMBUS:    He is a high risk candidate for embolism  given the entire picture.  He will need long term anticoagulation.  I think the lowest risk choice is to start warfarin today without heparin bridge.    Fayrene FearingJames [facility] 11/08/2013 9:53 AM

## 2013-11-08 NOTE — Progress Notes (Signed)
[facility] PROGRESS NOTE  Vista LawmanRobert Bunton ZOX:[id number] DOB: 11/03/1933 DOA: 11/01/2013 PCP: Dorrene GermanAVBUERE,EDWIN A, MD  Assessment/Plan: 80 y.o. male with a history of a Previous CVA without Residual Deficits, HTN, DM2, and Stage III CKD who presents to the ED with complaints of intermittent Chest Pain and palpitations, found to have pericardial effusion    1. Pericardial effusion -8/3: s/p pericardiocentesis 750 cc drained 08/03 (no organisms, mesothelial cells) -8/4-->8/5-->8/6: repeat echo: Moderatecircumferential effusion signs of tamponade. -8/7: s/p pericardial window: 500 cc of yellowish fluid removed; TEE, a left atrial appendage thrombus  -d/w cardiology [facility]appreciate CT surgery, cardiology input   2. PAF likely underlying chronic afib with h/o CVA; now  left atrial appendage thrombus  -on cardizem; on ASA;  [facility]  3. Chest pain, resolved Patient was ruled out for acute ACS, denied any specific chest pain 3. Diabetes mellitus uncontrolled complicated with diabetic neuropathy, chronic left foot ulcer  - Hemoglobin A1c 5.9, continue sliding scale insulin  4. Chronic diabetic left foot ulcer: Follows Dr. Lajoyce Cornersuda  - Has Roland RackUnna boot dressing, weeping, hence placed on doxycycline, cont wound care  5. Hypertension: borderline high; start BB if tolerated;  6. Diarrhea improved C. Difficile negative  7. Scrotal edema with inguinal hernia  - Scrotal ultrasound showed bowel containing left inguinal hernia admitting bowel into the scrotal sac.  - Surgery was consulted and recommended outpatient elective repair  8. Thrombocytopenia; Lovenox was on hold; platelets improved cont SCD; plan to start AC 9. CKD (chronic kidney disease) stage 3, GFR 30-59 ml/min; gentle hydration, baseline Cr 1.4-1.6, cr at baseline  10. Anemia: Appears to be chronic, no precipitous drop, likely decrease from hemodilution   DVT prophylaxis: SCD; [facility]  per [facility]  Code Status: full Family Communication:  D/w patient, recently called updated Oswaldo DoneSizemore,Mark Son [phone]   (indicate person spoken with, relationship, and if by phone, the number) Disposition Plan: pend clinical improvement    Consultants:  Cardiology  Procedures:  2-D echo  Pericardiocentesis Antibiotics:  Doxycycline   HPI/Subjective: alert  Objective: Filed Vitals:   11/08/13 0800  BP: 108/59  Pulse: 95  Temp:   Resp: 19    Intake/Output Summary (Last 24 hours) at 11/08/13 0921 Last data filed at 11/08/13 0800  Gross per 24 hour  Intake 3707.75 ml  Output   1760 ml  Net 1947.75 ml   Filed Weights   11/06/13 0400 11/07/13 0424 11/08/13 0540  Weight: 78 kg (171 lb 15.3 oz) 77.429 kg (170 lb 11.2 oz) 79.2 kg (174 lb 9.7 oz)    Exam:   General:  alert  Cardiovascular: s1,s2 irregular   Respiratory: CTA BL  Abdomen: soft, nt,nd   Musculoskeletal: L foot dressed   Data Reviewed: Basic Metabolic Panel:  Recent Labs Lab 11/01/13 2221 11/02/13 0445 11/03/13 0510 11/07/13 1340 11/08/13 0430  NA 134* 135* 140 139 138  K 4.3 4.0 4.2 4.1 5.0  CL 95* 100 106 103 105  CO2 24 24 24   --  20  GLUCOSE 146* 180* 161* 129* 125*  BUN 43* 42* 38* 27* 27*  CREATININE 1.49* 1.63* 1.56* 1.40* 1.24  CALCIUM 9.0 8.6 8.4  --  8.0*  MG  --  2.0  --   --  1.9   Liver Function Tests: No results found for this basename: AST, ALT, ALKPHOS, BILITOT, PROT, ALBUMIN,  in the last 168 hours No results found for  this basename: LIPASE, AMYLASE,  in the last 168 hours No results found for this basename: AMMONIA,  in the last 168 hours CBC:  Recent Labs Lab 11/02/13 0445 11/03/13 0510 11/06/13 0612 11/07/13 1300 11/07/13 1340 11/08/13 0430  WBC 7.2 4.9 5.8 4.7  --  5.2  HGB 9.4* 8.9* 9.0* 8.9* 9.2* 8.7*  HCT 28.5* 27.5* 26.9* 26.7* 27.0* 26.6*  MCV 86.6 85.9 84.9 83.4  --  84.4  PLT PLATELET CLUMPS NOTED ON SMEAR, COUNT APPEARS DECREASED 92* 157 183   --  189   Cardiac Enzymes:  Recent Labs Lab 11/02/13 0100 11/02/13 1005 11/02/13 1504 11/02/13 2059  TROPONINI <0.30 <0.30 <0.30 <0.30   BNP (last 3 results) No results found for this basename: PROBNP,  in the last 8760 hours CBG:  Recent Labs Lab 11/07/13 0751 11/07/13 1123 11/07/13 1530 11/07/13 2212 11/08/13 0723  GLUCAP 118* 162* 103* 126* 128*    Recent Results (from the past 240 hour(s))  CLOSTRIDIUM DIFFICILE BY PCR     Status: None   Collection Time    11/02/13  9:17 AM      Result Value Ref Range Status   C difficile by pcr NEGATIVE  NEGATIVE Final  BODY FLUID CULTURE     Status: None   Collection Time    11/03/13 11:50 AM      Result Value Ref Range Status   Specimen Description FLUID PERICARDIAL   Final   Special Requests NONE   Final   Gram Stain     Final   Value: RARE WBC PRESENT, PREDOMINANTLY MONONUCLEAR     NO ORGANISMS SEEN     Performed at [facility]   Culture     Final   Value: NO GROWTH 3 DAYS     Performed at [facility]   Report Status 11/06/2013 FINAL   Final     Studies: Dg Chest Port 1 View  11/07/2013   CLINICAL DATA:  Status post pericardial window creation  EXAM: PORTABLE CHEST - 1 VIEW  COMPARISON:  November 01, 2013  FINDINGS: Central catheter tip is in the superior vena cava. No pneumothorax. There is no appreciable edema or consolidation. Heart is prominent but stable. The pulmonary vascularity is normal. No adenopathy.  IMPRESSION: Heart prominent but stable. No edema or consolidation. Central catheter tip in superior vena cava without pneumothorax.   Electronically Signed   By: Bretta Bang M.D.   On: 11/07/2013 15:57    Scheduled Meds: . acetaminophen  1,000 mg Oral 4 times per day   Or  . acetaminophen (TYLENOL) oral liquid 160 mg/5 mL  1,000 mg Oral 4 times per day  . aspirin EC  325 mg Oral Daily  . bisacodyl  10 mg Oral Daily  . doxycycline  100 mg Oral Q12H  . ferrous sulfate  325 mg Oral TID WC   . insulin aspart  0-5 Units Subcutaneous QHS  . insulin aspart  0-9 Units Subcutaneous TID WC  . levalbuterol  0.63 mg Nebulization Q8H  . senna-docusate  1 tablet Oral QHS  . simvastatin  10 mg Oral Q lunch   Continuous Infusions: . sodium chloride 75 mL/hr at 11/08/13 0917  . diltiazem (CARDIZEM) infusion 5 mg/hr (11/08/13 0800)    Principal Problem:   Chest pain Active Problems:   Diabetes mellitus without complication   Hypertension   CKD (chronic kidney disease) stage 3, GFR 30-59 ml/min   Anemia of chronic disease   PAD (  peripheral artery disease)   Pericardial effusion   Ulcer of foot, chronic   Thrombocytopenia   Diarrhea   Scrotal edema   Atrial fibrillation with rapid ventricular response    Time spent: >35 minutes     Esperanza Sheets  [facility] Pager [phone]. If 7PM-7AM, please contact night-coverage at [email or web address], password [facility] 11/08/2013, 9:21 AM  LOS: 7 days

## 2013-11-08 NOTE — Progress Notes (Signed)
Patient ID: [name], male   DOB: 1933-07-16, 80 y.o.   MRN: [id number] T[facility] DAILY ICU PROGRESS NOTE                   [address]            [name] [id number]          [phone]   1 Day Post-Op Procedure(s) (LRB): SUBXYPHOID PERICARDIAL WINDOW (N/A)  Total Length of Stay:  LOS: 7 days   Subjective: Awake and alert, in and out of afib Objective: Vital signs in last 24 hours: Temp:  [97.5 F (36.4 C)-98.6 F (37 C)] 97.8 F (36.6 C) (08/08 0725) Pulse Rate:  [37-136] 135 (08/08 1000) Cardiac Rhythm:  [-] Atrial fibrillation (08/08 0800) Resp:  [16-26] 17 (08/08 1000) BP: (108-165)/(42-72) 120/66 mmHg (08/08 1000) SpO2:  [92 %-100 %] 92 % (08/08 1000) Arterial Line BP: (111-199)/(43-75) 140/54 mmHg (08/08 1000) Weight:  [174 lb 9.7 oz (79.2 kg)] 174 lb 9.7 oz (79.2 kg) (08/08 0540)  Filed Weights   11/06/13 0400 11/07/13 0424 11/08/13 0540  Weight: 171 lb 15.3 oz (78 kg) 170 lb 11.2 oz (77.429 kg) 174 lb 9.7 oz (79.2 kg)    Weight change: 3 lb 14.5 oz (1.771 kg)   Hemodynamic parameters for last 24 hours:    Intake/Output from previous day: 08/07 0701 - 08/08 0700 In: 3868.8 [P.O.:240; I.V.:3628.8] Out: 1690 [Urine:1010; Chest Tube:180]  Intake/Output this shift: Total I/O In: 79 [I.V.:79] Out: 175 [Urine:145; Chest Tube:30]  Current Meds: Scheduled Meds: . acetaminophen  1,000 mg Oral 4 times per day   Or  . acetaminophen (TYLENOL) oral liquid 160 mg/5 mL  1,000 mg Oral 4 times per day  . [START ON 11/09/2013] aspirin EC  81 mg Oral Daily  . bisacodyl  10 mg Oral Daily  . doxycycline  100 mg Oral Q12H  . ferrous sulfate  325 mg Oral TID WC  . insulin aspart  0-5 Units Subcutaneous QHS  . insulin aspart  0-9 Units Subcutaneous TID WC  . levalbuterol  0.63 mg Nebulization Q8H  . senna-docusate  1 tablet Oral QHS  . simvastatin  10 mg Oral Q lunch  . warfarin  5 mg Oral q1800  . Warfarin - Pharmacist Dosing Inpatient   Does not apply  q1800   Continuous Infusions: . sodium chloride 75 mL/hr at 11/08/13 0917  . amiodarone    . amiodarone     PRN Meds:.fentaNYL, ondansetron (ZOFRAN) IV, oxyCODONE, potassium chloride, traMADol  General appearance: alert, cooperative, appears older than stated age and no distress Neurologic: intact Heart: irregularly irregular rhythm Lungs: diminished breath sounds bibasilar Abdomen: soft, non-tender; bowel sounds normal; no masses,  no organomegaly Extremities: extremities normal, atraumatic, no cyanosis or edema and Homans sign is negative, no sign of DVT Wound: intact, pericardial tube in place  Lab Results: CBC: Recent Labs  11/07/13 1300 11/07/13 1340 11/08/13 0430  WBC 4.7  --  5.2  HGB 8.9* 9.2* 8.7*  HCT 26.7* 27.0* 26.6*  PLT 183  --  189   BMET:  Recent Labs  11/07/13 1340 11/08/13 0430  NA 139 138  K 4.1 5.0  CL 103 105  CO2  --  20  GLUCOSE 129* 125*  BUN 27* 27*  CREATININE 1.40* 1.24  CALCIUM  --  8.0*    PT/INR: No results found for this basename: LABPROT, INR,  in the last 72 hours Radiology: Dg Chest [facility]  1 View  11/07/2013   CLINICAL DATA:  Status post pericardial window creation  EXAM: PORTABLE CHEST - 1 VIEW  COMPARISON:  November 01, 2013  FINDINGS: Central catheter tip is in the superior vena cava. No pneumothorax. There is no appreciable edema or consolidation. Heart is prominent but stable. The pulmonary vascularity is normal. No adenopathy.  IMPRESSION: Heart prominent but stable. No edema or consolidation. Central catheter tip in superior vena cava without pneumothorax.   Electronically Signed   By: Bretta BangWilliam  Woodruff M.D.   On: 11/07/2013 15:57   Results of plaural fliud pending   Assessment/Plan: S/P Procedure(s) (LRB): SUBXYPHOID PERICARDIAL WINDOW (N/A) Mobilize discussed with cardiology use of coumadin, will start today Will leave pericardial tube today and remove in am     [name] B 11/08/2013 10:53 AM

## 2013-11-09 ENCOUNTER — Inpatient Hospital Stay (HOSPITAL_COMMUNITY): Payer: Medicare Other

## 2013-11-09 LAB — COMPREHENSIVE METABOLIC PANEL
ALBUMIN: 1.7 g/dL — AB (ref 3.5–5.2)
ALK PHOS: 80 U/L (ref 39–117)
ALT: 7 U/L (ref 0–53)
ANION GAP: 11 (ref 5–15)
AST: 9 U/L (ref 0–37)
BUN: 25 mg/dL — ABNORMAL HIGH (ref 6–23)
CHLORIDE: 104 meq/L (ref 96–112)
CO2: 20 mEq/L (ref 19–32)
Calcium: 7.6 mg/dL — ABNORMAL LOW (ref 8.4–10.5)
Creatinine, Ser: 1.42 mg/dL — ABNORMAL HIGH (ref 0.50–1.35)
GFR calc Af Amer: 53 mL/min — ABNORMAL LOW (ref >90)
GFR calc non Af Amer: 46 mL/min — ABNORMAL LOW (ref >90)
Glucose, Bld: 125 mg/dL — ABNORMAL HIGH (ref 70–99)
POTASSIUM: 4.3 meq/L (ref 3.7–5.3)
SODIUM: 135 meq/L — AB (ref 137–147)
TOTAL PROTEIN: 5.1 g/dL — AB (ref 6.0–8.3)
Total Bilirubin: 0.8 mg/dL (ref 0.3–1.2)

## 2013-11-09 LAB — PROTIME-INR
INR: 1.25 (ref 0.00–1.49)
Prothrombin Time: 15.7 seconds — ABNORMAL HIGH (ref 11.6–15.2)

## 2013-11-09 LAB — CBC
HCT: 26.5 % — ABNORMAL LOW (ref 39.0–52.0)
HEMOGLOBIN: 8.7 g/dL — AB (ref 13.0–17.0)
MCH: 28.2 pg (ref 26.0–34.0)
MCHC: 32.8 g/dL (ref 30.0–36.0)
MCV: 85.8 fL (ref 78.0–100.0)
Platelets: 183 10*3/uL (ref 150–400)
RBC: 3.09 MIL/uL — ABNORMAL LOW (ref 4.22–5.81)
RDW: 15.4 % (ref 11.5–15.5)
WBC: 5.7 10*3/uL (ref 4.0–10.5)

## 2013-11-09 LAB — GLUCOSE, CAPILLARY
GLUCOSE-CAPILLARY: 131 mg/dL — AB (ref 70–99)
GLUCOSE-CAPILLARY: 146 mg/dL — AB (ref 70–99)
GLUCOSE-CAPILLARY: 121 mg/dL — AB (ref 70–99)
Glucose-Capillary: 149 mg/dL — ABNORMAL HIGH (ref 70–99)

## 2013-11-09 MED ORDER — SENNOSIDES-DOCUSATE SODIUM 8.6-50 MG PO TABS
1.0000 | ORAL_TABLET | Freq: Two times a day (BID) | ORAL | Status: DC
  Administered 2013-11-09 – 2013-11-13 (×8): 1 via ORAL
  Filled 2013-11-09 (×11): qty 1

## 2013-11-09 MED ORDER — HYDRALAZINE HCL 20 MG/ML IJ SOLN
10.0000 mg | Freq: Once | INTRAMUSCULAR | Status: AC
  Administered 2013-11-09: 10 mg via INTRAVENOUS
  Filled 2013-11-09: qty 1

## 2013-11-09 MED ORDER — HYDRALAZINE HCL 20 MG/ML IJ SOLN
10.0000 mg | Freq: Four times a day (QID) | INTRAMUSCULAR | Status: DC | PRN
  Administered 2013-11-09: 10 mg via INTRAVENOUS
  Filled 2013-11-09: qty 1

## 2013-11-09 MED ORDER — SODIUM CHLORIDE 0.9 % IJ SOLN
10.0000 mL | INTRAMUSCULAR | Status: DC | PRN
  Administered 2013-11-09 – 2013-11-13 (×4): 10 mL

## 2013-11-09 MED ORDER — AMLODIPINE BESYLATE 5 MG PO TABS
5.0000 mg | ORAL_TABLET | Freq: Every day | ORAL | Status: DC
  Administered 2013-11-09 – 2013-11-13 (×5): 5 mg via ORAL
  Filled 2013-11-09 (×5): qty 1

## 2013-11-09 NOTE — Progress Notes (Signed)
SUBJECTIVE:  No chest pain.  Breathing OK.     PHYSICAL EXAM Filed Vitals:   11/09/13 0600 11/09/13 0645 11/09/13 0700 11/09/13 0738  BP: 162/66  158/71   Pulse: 66  69   Temp:    98.2 F (36.8 C)  TempSrc:    Oral  Resp: 23  14   Height:      Weight:  180 lb 12.4 oz (82 kg)    SpO2: 95%  97%    General:  No distress Lungs:  Clear Heart:  No rub Abdomen:  Positive bowel sounds, no rebound no guarding Extremities:  No edema   LABS:  Results for orders placed during the hospital encounter of 11/01/13 (from the past 24 hour(s))  GLUCOSE, CAPILLARY     Status: Abnormal   Collection Time    11/08/13 12:12 PM      Result Value Ref Range   Glucose-Capillary 124 (*) 70 - 99 mg/dL   Comment 1 Notify RN    GLUCOSE, CAPILLARY     Status: Abnormal   Collection Time    11/08/13  4:21 PM      Result Value Ref Range   Glucose-Capillary 133 (*) 70 - 99 mg/dL   Comment 1 Notify RN    GLUCOSE, CAPILLARY     Status: Abnormal   Collection Time    11/08/13 10:08 PM      Result Value Ref Range   Glucose-Capillary 134 (*) 70 - 99 mg/dL   Comment 1 Documented in Chart     Comment 2 Notify RN    CBC     Status: Abnormal   Collection Time    11/09/13  3:40 AM      Result Value Ref Range   WBC 5.7  4.0 - 10.5 K/uL   RBC 3.09 (*) 4.22 - 5.81 MIL/uL   Hemoglobin 8.7 (*) 13.0 - 17.0 g/dL   HCT 43.7 (*) 35.7 - 89.7 %   MCV 85.8  78.0 - 100.0 fL   MCH 28.2  26.0 - 34.0 pg   MCHC 32.8  30.0 - 36.0 g/dL   RDW 84.7  84.1 - 28.2 %   Platelets 183  150 - 400 K/uL  COMPREHENSIVE METABOLIC PANEL     Status: Abnormal   Collection Time    11/09/13  3:40 AM      Result Value Ref Range   Sodium 135 (*) 137 - 147 mEq/L   Potassium 4.3  3.7 - 5.3 mEq/L   Chloride 104  96 - 112 mEq/L   CO2 20  19 - 32 mEq/L   Glucose, Bld 125 (*) 70 - 99 mg/dL   BUN 25 (*) 6 - 23 mg/dL   Creatinine, Ser 0.81 (*) 0.50 - 1.35 mg/dL   Calcium 7.6 (*) 8.4 - 10.5 mg/dL   Total Protein 5.1 (*) 6.0 - 8.3 g/dL     Albumin 1.7 (*) 3.5 - 5.2 g/dL   AST 9  0 - 37 U/L   ALT 7  0 - 53 U/L   Alkaline Phosphatase 80  39 - 117 U/L   Total Bilirubin 0.8  0.3 - 1.2 mg/dL   GFR calc non Af Amer 46 (*) >90 mL/min   GFR calc Af Amer 53 (*) >90 mL/min   Anion gap 11  5 - 15  PROTIME-INR     Status: Abnormal   Collection Time    11/09/13  3:40 AM  Result Value Ref Range   Prothrombin Time 15.7 (*) 11.6 - 15.2 seconds   INR 1.25  0.00 - 1.49  GLUCOSE, CAPILLARY     Status: Abnormal   Collection Time    11/09/13  7:36 AM      Result Value Ref Range   Glucose-Capillary 131 (*) 70 - 99 mg/dL   Comment 1 Notify RN      Intake/Output Summary (Last 24 hours) at 11/09/13 [id number] Last data filed at 11/09/13 0700  Gross per 24 hour  Intake 2347.7 ml  Output    755 ml  Net 1592.7 ml    ASSESSMENT AND PLAN:  ATRIAL FIB:  Started on IV amio yesterday with Cardizem discontinued.  I would like to leave the IV amio today and change to PO in the AM.    BP is up slightly.  No further bradycardia.   PERICARDIAL EFFUSION:  Status post pericardial window.    LEFT ATRIAL THROMBUS:    He is a high risk candidate for embolism given the entire picture.  Started on Warfarin without heparin bridge.    HTN:  Norvasc added today.    Barry FearingJames [facility] Area Community Hospitalochrein 11/09/2013 9:37 AM

## 2013-11-09 NOTE — Progress Notes (Signed)
UR Completed.  [name] [phone] 11/09/2013  

## 2013-11-09 NOTE — Progress Notes (Signed)
Patient ID: [name], male   DOB: [birth year], 80 y.o.   MRN: [id number] TCTS DAILY ICU PROGRESS NOTE                   [address]            [name] [address]          [phone]   2 Days Post-Op Procedure(s) (LRB): SUBXYPHOID PERICARDIAL WINDOW (N/A)  Total Length of Stay:  LOS: 8 days   Subjective: Feels better , some nausea, in sinus   Objective: Vital signs in last 24 hours: Temp:  [97.9 F (36.6 C)-98.4 F (36.9 C)] 98.2 F (36.8 C) (08/09 0738) Pulse Rate:  [60-135] 69 (08/09 0700) Cardiac Rhythm:  [-] Normal sinus rhythm (08/09 0400) Resp:  [14-26] 14 (08/09 0700) BP: (109-163)/(50-85) 158/71 mmHg (08/09 0700) SpO2:  [92 %-100 %] 97 % (08/09 0700) Arterial Line BP: (130-176)/(47-74) 157/58 mmHg (08/09 0700) Weight:  [180 lb 12.4 oz (82 kg)] 180 lb 12.4 oz (82 kg) (08/09 0645)  Filed Weights   11/07/13 0424 11/08/13 0540 11/09/13 0645  Weight: 170 lb 11.2 oz (77.429 kg) 174 lb 9.7 oz (79.2 kg) 180 lb 12.4 oz (82 kg)    Weight change: 6 lb 2.8 oz (2.8 kg)   Hemodynamic parameters for last 24 hours:    Intake/Output from previous day: 08/08 0701 - 08/09 0700 In: 2501.7 [I.V.:2251.7; IV Piggyback:250] Out: 910 [Urine:800; Chest Tube:110]  Intake/Output this shift:    Current Meds: Scheduled Meds: . acetaminophen  1,000 mg Oral 4 times per day   Or  . acetaminophen (TYLENOL) oral liquid 160 mg/5 mL  1,000 mg Oral 4 times per day  . amLODipine  5 mg Oral Daily  . aspirin EC  81 mg Oral Daily  . bisacodyl  10 mg Oral Daily  . doxycycline  100 mg Oral Q12H  . ferrous sulfate  325 mg Oral TID WC  . insulin aspart  0-5 Units Subcutaneous QHS  . insulin aspart  0-9 Units Subcutaneous TID WC  . senna-docusate  1 tablet Oral BID  . simvastatin  10 mg Oral Q lunch  . warfarin  5 mg Oral q1800  . Warfarin - Pharmacist Dosing Inpatient   Does not apply q1800   Continuous Infusions: . sodium chloride 75 mL/hr at 11/09/13 0535  .  amiodarone 30 mg/hr (11/09/13 0400)   PRN Meds:.fentaNYL, hydrALAZINE, ondansetron (ZOFRAN) IV, oxyCODONE, potassium chloride, traMADol  General appearance: alert, cooperative and appears older than stated age Neurologic: intact Heart: regular rate and rhythm, S1, S2 normal, no murmur, click, rub or gallop Lungs: clear to auscultation bilaterally Abdomen: soft, non-tender; bowel sounds normal; no masses,  no organomegaly Extremities: left foot ulcer foul smelling  Wound: intact, d/c drain today  Lab Results: CBC: Recent Labs  11/08/13 0430 11/09/13 0340  WBC 5.2 5.7  HGB 8.7* 8.7*  HCT 26.6* 26.5*  PLT 189 183   BMET:  Recent Labs  11/08/13 0430 11/09/13 0340  NA 138 135*  K 5.0 4.3  CL 105 104  CO2 20 20  GLUCOSE 125* 125*  BUN 27* 25*  CREATININE 1.24 1.42*  CALCIUM 8.0* 7.6*    PT/INR:  Recent Labs  11/09/13 0340  LABPROT 15.7*  INR 1.25   Radiology: Dg Chest Port 1 View  11/09/2013   CLINICAL DATA:  80 year old male -status post pericardial window for large pericardial effusions/tamponade.  EXAM: PORTABLE CHEST - 1 VIEW  COMPARISON:  11/07/2013 and prior chest radiographs  FINDINGS: This is a low volume film.  Bibasilar opacities probably represent atelectasis.  Decreased pulmonary vascular congestion noted.  A right IJ central venous catheter is identified with tip overlying the mid -lower SVC  There is no evidence of pneumothorax.  Enlargement of the cardiopericardial silhouette is again noted but appears smaller since compared to 11/01/2013.  A pericardial drain is again noted.  IMPRESSION: Low volume film with increasing bibasilar opacities -suspect atelectasis.  No evidence of pneumothorax.   Electronically Signed   By: Laveda AbbeJeff  Hu M.D.   On: 11/09/2013 08:22   Dg Chest Port 1 View  11/07/2013   CLINICAL DATA:  Status post pericardial window creation  EXAM: PORTABLE CHEST - 1 VIEW  COMPARISON:  November 01, 2013  FINDINGS: Central catheter tip is in the superior vena  cava. No pneumothorax. There is no appreciable edema or consolidation. Heart is prominent but stable. The pulmonary vascularity is normal. No adenopathy.  IMPRESSION: Heart prominent but stable. No edema or consolidation. Central catheter tip in superior vena cava without pneumothorax.   Electronically Signed   By: Bretta BangWilliam  Woodruff M.D.   On: 11/07/2013 15:57     Assessment/Plan: S/P Procedure(s) (LRB): SUBXYPHOID PERICARDIAL WINDOW (N/A) will d/c pericardial drain today   Adrean Heitz B 11/09/2013 9:47 AM

## 2013-11-09 NOTE — Progress Notes (Signed)
Dr. Audrie Lia office called; answering service paged on-call MD Dr. Magnus Ivan.  Dr. Magnus Ivan returned page and ordered to change dressing with xeroform, gauze, and compression wrap.

## 2013-11-09 NOTE — Progress Notes (Signed)
[facility] PROGRESS NOTE  [name] JYN:[id number] DOB: [birth year] DOA: 11/01/2013 PCP: Dorrene German, MD  Assessment/Plan: 80 y.o. male with a history of a Previous CVA without Residual Deficits, HTN, DM2, and Stage III CKD who presents to the ED with complaints of intermittent Chest Pain and palpitations, found to have pericardial effusion    1. Pericardial effusion -8/3: s/p pericardiocentesis 750 cc drained 08/03 (no organisms, mesothelial cells) -8/4-->8/5-->8/6: repeat echo: Moderatecircumferential effusion signs of tamponade. -8/7: s/p pericardial window: 500 cc of yellowish fluid removed; TEE, a left atrial appendage thrombus  -started on warfarin/AC; appreciate CT surgery, cardiology input   2. PAF likely underlying chronic afib with h/o CVA; now  left atrial appendage thrombus  -off Cardizem changed to amio; ? Need  BB; on warfarin   3. Chest pain, resolved Patient was ruled out for acute ACS, denied any specific chest pain 3. Diabetes mellitus uncontrolled complicated with diabetic neuropathy, chronic left foot ulcer  - Hemoglobin A1c 5.9, continue sliding scale insulin  4. Chronic diabetic left foot ulcer: Follows Dr. Lajoyce Corners  - Has Roland Rack boot dressing, weeping, hence placed on doxycycline, cont wound care  5. Hypertension: borderline high; started amlodipine, add BB if tolerated; prn hydralazine   6. Diarrhea improved C. Difficile negative  7. Scrotal edema with inguinal hernia  - Scrotal ultrasound showed bowel containing left inguinal hernia admitting bowel into the scrotal sac.  - Surgery was consulted and recommended outpatient elective repair  8. Thrombocytopenia; Lovenox was on hold; platelets improved cont SCD; started warfarin;'  9. CKD (chronic kidney disease) stage 3, GFR 30-59 ml/min; gentle hydration, baseline Cr 1.4-1.6, cr at baseline  10. Anemia: Appears to be chronic, no precipitous drop, likely decrease from hemodilution     DVT prophylaxis: SCD;  warfarin   Code Status: full Family Communication:  D/w patient, recently called updated Kasson,Mark Son [phone]   (indicate person spoken with, relationship, and if by phone, the number) Disposition Plan: pend clinical improvement    Consultants:  Cardiology  Procedures:  2-D echo  Pericardiocentesis Antibiotics:  Doxycycline   HPI/Subjective: alert  Objective: Filed Vitals:   11/09/13 0738  BP:   Pulse:   Temp: 98.2 F (36.8 C)  Resp:     Intake/Output Summary (Last 24 hours) at 11/09/13 0829 Last data filed at 11/09/13 0700  Gross per 24 hour  Intake 2422.7 ml  Output    840 ml  Net 1582.7 ml   Filed Weights   11/07/13 0424 11/08/13 0540 11/09/13 0645  Weight: 77.429 kg (170 lb 11.2 oz) 79.2 kg (174 lb 9.7 oz) 82 kg (180 lb 12.4 oz)    Exam:   General:  alert  Cardiovascular: s1,s2 irregular   Respiratory: CTA BL  Abdomen: soft, nt,nd   Musculoskeletal: L foot dressed   Data Reviewed: Basic Metabolic Panel:  Recent Labs Lab 11/03/13 0510 11/07/13 1340 11/08/13 0430 11/09/13 0340  NA 140 139 138 135*  K 4.2 4.1 5.0 4.3  CL 106 103 105 104  CO2 24  --  20 20  GLUCOSE 161* 129* 125* 125*  BUN 38* 27* 27* 25*  CREATININE 1.56* 1.40* 1.24 1.42*  CALCIUM 8.4  --  8.0* 7.6*  MG  --   --  1.9  --    Liver Function Tests:  Recent Labs Lab 11/09/13 0340  AST 9  ALT 7  ALKPHOS 80  BILITOT 0.8  PROT 5.1*  ALBUMIN 1.7*   No results found for this basename:  LIPASE, AMYLASE,  in the last 168 hours No results found for this basename: AMMONIA,  in the last 168 hours CBC:  Recent Labs Lab 11/03/13 0510 11/06/13 0612 11/07/13 1300 11/07/13 1340 11/08/13 0430 11/09/13 0340  WBC 4.9 5.8 4.7  --  5.2 5.7  HGB 8.9* 9.0* 8.9* 9.2* 8.7* 8.7*  HCT 27.5* 26.9* 26.7* 27.0* 26.6* 26.5*  MCV 85.9 84.9 83.4  --  84.4 85.8  PLT 92* 157 183  --  189 183   Cardiac Enzymes:  Recent Labs Lab 11/02/13 1005 11/02/13 1504 11/02/13 2059   TROPONINI <0.30 <0.30 <0.30   BNP (last 3 results) No results found for this basename: PROBNP,  in the last 8760 hours CBG:  Recent Labs Lab 11/07/13 2212 11/08/13 0723 11/08/13 1212 11/08/13 1621 11/08/13 2208  GLUCAP 126* 128* 124* 133* 134*    Recent Results (from the past 240 hour(s))  CLOSTRIDIUM DIFFICILE BY PCR     Status: None   Collection Time    11/02/13  9:17 AM      Result Value Ref Range Status   C difficile by pcr NEGATIVE  NEGATIVE Final  BODY FLUID CULTURE     Status: None   Collection Time    11/03/13 11:50 AM      Result Value Ref Range Status   Specimen Description FLUID PERICARDIAL   Final   Special Requests NONE   Final   Gram Stain     Final   Value: RARE WBC PRESENT, PREDOMINANTLY MONONUCLEAR     NO ORGANISMS SEEN     Performed at [facility]   Culture     Final   Value: NO GROWTH 3 DAYS     Performed at [facility]   Report Status 11/06/2013 FINAL   Final  BODY FLUID CULTURE     Status: None   Collection Time    11/07/13  2:55 PM      Result Value Ref Range Status   Specimen Description PERICARDIAL FLUID   Final   Special Requests PATIENT ON FOLLOWING DOXYCYCLIN   Final   Gram Stain     Final   Value: NO WBC SEEN     NO ORGANISMS SEEN     Performed at [facility]   Culture     Final   Value: NO GROWTH 1 DAY     Performed at [facility]   Report Status PENDING   Incomplete  AFB CULTURE WITH SMEAR     Status: None   Collection Time    11/07/13  2:55 PM      Result Value Ref Range Status   Specimen Description PERICARDIAL FLUID   Final   Special Requests PATIENT ON FOLLOWING DOXYCYCLIN   Final   Acid Fast Smear     Final   Value: NO ACID FAST BACILLI SEEN     Performed at [facility]   Culture     Final   Value: CULTURE WILL BE EXAMINED FOR 6 WEEKS BEFORE ISSUING A FINAL REPORT     Performed at [facility]   Report Status PENDING   Incomplete  FUNGUS CULTURE W SMEAR     Status:  None   Collection Time    11/07/13  2:55 PM      Result Value Ref Range Status   Specimen Description PERICARDIAL FLUID   Final   Special Requests PATIENT ON FOLLOWING DOXYCYCLIN   Final   Fungal Smear  Final   Value: NO YEAST OR FUNGAL ELEMENTS SEEN     [facility]   Culture     Final   Value: CULTURE IN PROGRESS FOR FOUR WEEKS     [facility]   Report Status PENDING   Incomplete  ANAEROBIC CULTURE     Status: None   Collection Time    11/07/13  2:55 PM      Result Value Ref Range Status   Specimen Description PERICARDIAL FLUID   Final   Special Requests PATIENT ON FOLLOWING DOXYCYCLIN   Final   Gram Stain PENDING   Incomplete   Culture     Final   Value: NO ANAEROBES ISOLATED; CULTURE IN PROGRESS FOR 5 DAYS     [facility]   Report Status PENDING   Incomplete     Studies: Dg Chest Port 1 View  11/09/2013   CLINICAL DATA:  80 year old male -status post pericardial window for large pericardial effusions/tamponade.  EXAM: PORTABLE CHEST - 1 VIEW  COMPARISON:  11/07/2013 and prior chest radiographs  FINDINGS: This is a low volume film.  Bibasilar opacities probably represent atelectasis.  Decreased pulmonary vascular congestion noted.  A right IJ central venous catheter is identified with tip overlying the mid -lower SVC  There is no evidence of pneumothorax.  Enlargement of the cardiopericardial silhouette is again noted but appears smaller since compared to 11/01/2013.  A pericardial drain is again noted.  IMPRESSION: Low volume film with increasing bibasilar opacities -suspect atelectasis.  No evidence of pneumothorax.   Electronically Signed   By: Laveda AbbeJeff  Hu M.D.   On: 11/09/2013 08:22   Dg Chest Port 1 View  11/07/2013   CLINICAL DATA:  Status post pericardial window creation  EXAM: PORTABLE CHEST - 1 VIEW  COMPARISON:  November 01, 2013  FINDINGS: Central catheter tip is in the superior vena cava. No pneumothorax. There  is no appreciable edema or consolidation. Heart is prominent but stable. The pulmonary vascularity is normal. No adenopathy.  IMPRESSION: Heart prominent but stable. No edema or consolidation. Central catheter tip in superior vena cava without pneumothorax.   Electronically Signed   By: Bretta BangWilliam  Woodruff M.D.   On: 11/07/2013 15:57    Scheduled Meds: . acetaminophen  1,000 mg Oral 4 times per day   Or  . acetaminophen (TYLENOL) oral liquid 160 mg/5 mL  1,000 mg Oral 4 times per day  . amLODipine  5 mg Oral Daily  . aspirin EC  81 mg Oral Daily  . bisacodyl  10 mg Oral Daily  . doxycycline  100 mg Oral Q12H  . ferrous sulfate  325 mg Oral TID WC  . insulin aspart  0-5 Units Subcutaneous QHS  . insulin aspart  0-9 Units Subcutaneous TID WC  . senna-docusate  1 tablet Oral QHS  . simvastatin  10 mg Oral Q lunch  . warfarin  5 mg Oral q1800  . Warfarin - Pharmacist Dosing Inpatient   Does not apply q1800   Continuous Infusions: . sodium chloride 75 mL/hr at 11/09/13 0535  . amiodarone 30 mg/hr (11/09/13 0400)    Principal Problem:   Chest pain Active Problems:   Diabetes mellitus without complication   Hypertension   CKD (chronic kidney disease) stage 3, GFR 30-59 ml/min   Anemia of chronic disease   PAD (peripheral artery disease)   Pericardial effusion   Ulcer of foot, chronic   Thrombocytopenia  Diarrhea   Scrotal edema   Atrial fibrillation with rapid ventricular response    Time spent: >35 minutes     Esperanza Sheets  [facility] Pager [phone]. If 7PM-7AM, please contact night-coverage at [email or web address], password [facility] 11/09/2013, 8:29 AM  LOS: 8 days

## 2013-11-09 NOTE — Progress Notes (Signed)
ANTICOAGULATION CONSULT NOTE   Pharmacy Consult for Coumadin Indication: atrial fibrillation, left atrial thrombus  No Known Allergies  Patient Measurements: Height: 5\' 7"  (170.2 cm) Weight: 180 lb 12.4 oz (82 kg) IBW/kg (Calculated) : 66.1  Vital Signs: Temp: 98.2 F (36.8 C) (08/09 0738) Temp src: Oral (08/09 0738) BP: 155/66 mmHg (08/09 1000) Pulse Rate: 84 (08/09 1000)  Labs:  Recent Labs  11/07/13 1300 11/07/13 1340 11/08/13 0430 11/09/13 0340  HGB 8.9* 9.2* 8.7* 8.7*  HCT 26.7* 27.0* 26.6* 26.5*  PLT 183  --  189 183  LABPROT  --   --   --  15.7*  INR  --   --   --  1.25  CREATININE  --  1.40* 1.24 1.42*    Estimated Creatinine Clearance: 44 ml/min (by C-G formula based on Cr of 1.42).  Assessment: 80 year old male continues Coumadin for Afib and left atrial thrombus.  S/p pericardial window 8/7  INR = 1.25  Goal of Therapy:  INR 2-3 Monitor platelets by anticoagulation protocol: Yes   Plan:  1) Coumadin 5 mg po daily at 1800 pm 2) Daily INR 3) Coumadin education.  Thank you. Okey Regal, PharmD [phone]  11/09/2013,1:13 PM

## 2013-11-09 NOTE — Op Note (Signed)
NAME:  [name], [name] NO.:  [id number]  MEDICAL RECORD NO.:  [id number]  LOCATION:  2S14C                        FACILITY:  [facility]  PHYSICIAN:  Sheliah Plane, MD    DATE OF BIRTH:  01-20-1934  DATE OF PROCEDURE:  11/07/2013 DATE OF DISCHARGE:                              OPERATIVE REPORT   PREOPERATIVE DIAGNOSIS:  Persistent pericardial effusion with echo evidence of tamponade.  POSTOPERATIVE DIAGNOSES:  Persistent pericardial effusion with echo evidence of tamponade with additional diagnosis of left atrial clot based on transesophageal echo.  PROCEDURE PERFORMED: 1. Subxiphoid pericardial window with drainage of pericardial     effusion. 2. Transesophageal echo.  SURGEON:  Sheliah Plane, MD  FIRST ASSISTANT:  Doree Fudge, PA  BRIEF HISTORY:  The patient is a 80 year old male who has been followed by the Cardiology Service since admission 1 week previously with evidence of pericardial effusion, at least moderate in size.  An attempt at percutaneous pericardial drainage was resulted in removal approximately 600 mL of fluid, but persistent effusion remained and increased because of the persistent confusion, Cardiac Surgery consultation was obtained.  We proceeded with urgent drainage of the pericardial effusion with subxiphoid window.  Risks and options were discussed with the patient and his son in detail.  DESCRIPTION OF PROCEDURE:  With appropriate monitoring in place, the patient underwent general endotracheal anesthesia without incident. Time-out procedure was performed.  Dr. Arita Miss performed a transesophageal echo which showed a moderate to large pericardial effusion.  In addition, there was evidence of left atrial clot present. The chest was prepped with Betadine and draped in usual sterile manner. A small sub xiphoid pericardial incision was made over the xiphoid process and dissection was carried down to the pericardium.   The pericardium was opened and approximately 500 mL of straw-colored pericardial fluid was removed.  The pericardial window was created in the anterior pericardium as segment of pericardium submitted to pathology.  Appropriate cultures and cytology were also sent.  A 28 Blake drain was left in the inferior portion of the pericardium, brought out through separate site in the upper abdominal wall.  The fascia was then closed with interrupted 0 Vicryl, running 2-0 Vicryl, subcutaneous tissue, and 4-0 subcuticular stitch in skin edges.  Dry dressing and Dermabond was placed. The patient was awakened and extubated in the operating room.  The sponge and needle count was reported as correct at completion of the procedure.  He tolerated the procedure without obvious complication and was transferred to the recovery room for further postoperative care.     Sheliah Plane, MD     EG/MEDQ  D:  11/09/2013  T:  11/09/2013  Job:  [id number]

## 2013-11-10 ENCOUNTER — Encounter (HOSPITAL_COMMUNITY): Payer: Self-pay | Admitting: Cardiothoracic Surgery

## 2013-11-10 DIAGNOSIS — I739 Peripheral vascular disease, unspecified: Secondary | ICD-10-CM

## 2013-11-10 LAB — PROTIME-INR
INR: 2.04 — ABNORMAL HIGH (ref 0.00–1.49)
Prothrombin Time: 23 seconds — ABNORMAL HIGH (ref 11.6–15.2)

## 2013-11-10 LAB — GLUCOSE, CAPILLARY
GLUCOSE-CAPILLARY: 132 mg/dL — AB (ref 70–99)
Glucose-Capillary: 111 mg/dL — ABNORMAL HIGH (ref 70–99)
Glucose-Capillary: 142 mg/dL — ABNORMAL HIGH (ref 70–99)
Glucose-Capillary: 122 mg/dL — ABNORMAL HIGH (ref 70–99)

## 2013-11-10 LAB — PATHOLOGIST SMEAR REVIEW

## 2013-11-10 MED ORDER — FLEET ENEMA 7-19 GM/118ML RE ENEM
1.0000 | ENEMA | Freq: Once | RECTAL | Status: AC
  Administered 2013-11-10: 1 via RECTAL
  Filled 2013-11-10: qty 1

## 2013-11-10 MED ORDER — METOPROLOL TARTRATE 12.5 MG HALF TABLET
12.5000 mg | ORAL_TABLET | Freq: Two times a day (BID) | ORAL | Status: DC
  Administered 2013-11-10 – 2013-11-13 (×6): 12.5 mg via ORAL
  Filled 2013-11-10 (×8): qty 1

## 2013-11-10 MED ORDER — AMIODARONE HCL 200 MG PO TABS
400.0000 mg | ORAL_TABLET | Freq: Every day | ORAL | Status: DC
  Administered 2013-11-10 – 2013-11-13 (×3): 400 mg via ORAL
  Filled 2013-11-10 (×4): qty 2

## 2013-11-10 MED ORDER — COUMADIN BOOK
Freq: Once | Status: AC
  Administered 2013-11-10: 13:00:00
  Filled 2013-11-10: qty 1

## 2013-11-10 MED ORDER — WARFARIN VIDEO
Freq: Once | Status: AC
  Administered 2013-11-10: 1

## 2013-11-10 NOTE — Progress Notes (Signed)
PT Cancellation Note  Patient Details Name: [name] MRN: [id number] DOB: [birth year]   Cancelled Treatment:    Reason Eval/Treat Not Completed: Medical issues which prohibited therapy.  Pt c/o nausea and declining mobility at this time.     Orena Cavazos, Alison Murray 11/10/2013, 11:19 AM

## 2013-11-10 NOTE — Progress Notes (Addendum)
Pt. Seen and examined. Agree with the NP/PA-C note as written.  Improved after pericardial window. Incidental finding of LAA thrombus, now on warfarin. Will likely need rehabilitation due to deconditioning, PT to see today. Maintaining sinus rhythm. On amiodarone gtts - will change to 400 mg po amiodarone daily.  Little else to add at this point. Will eventually need follow-up with Dr. Allyson Sabal for cardiology. Will sign-off for now, call with questions.  Chrystie Nose, MD, [facility] Attending Cardiologist [facility]

## 2013-11-10 NOTE — Progress Notes (Signed)
ANTICOAGULATION CONSULT NOTE   Pharmacy Consult for Coumadin Indication: atrial fibrillation, left atrial thrombus  No Known Allergies  Patient Measurements: Height: 5\' 7"  (170.2 cm) Weight: 179 lb 14.3 oz (81.6 kg) IBW/kg (Calculated) : 66.1  Vital Signs: Temp: 98.4 F (36.9 C) (08/10 0418) Temp src: Oral (08/10 0418) BP: 161/55 mmHg (08/10 1003) Pulse Rate: 70 (08/10 1003)  Labs:  Recent Labs  11/07/13 1300 11/07/13 1340 11/08/13 0430 11/09/13 0340 11/10/13 0500  HGB 8.9* 9.2* 8.7* 8.7*  --   HCT 26.7* 27.0* 26.6* 26.5*  --   PLT 183  --  189 183  --   LABPROT  --   --   --  15.7* 23.0*  INR  --   --   --  1.25 2.04*  CREATININE  --  1.40* 1.24 1.42*  --     Estimated Creatinine Clearance: 43.8 ml/min (by C-G formula based on Cr of 1.42).  Assessment: 80 year old male continues Coumadin for Afib and left atrial thrombus.  S/p pericardial window 8/7  Protime increased by 7.3 sec overnight  No bleeding noted  Goal of Therapy:  INR 2-3 Monitor platelets by anticoagulation protocol: Yes   Plan:  1) No coumadin today 2) Daily INR 3) Coumadin education.  Thank you. Talbert Cage, PharmD [phone] 11/10/2013,11:24 AM

## 2013-11-10 NOTE — Progress Notes (Signed)
[facility] PROGRESS NOTE  Vista LawmanRobert Brophy WUJ:[id number] DOB: 05/15/1933 DOA: 11/01/2013 PCP: Dorrene GermanAVBUERE,EDWIN A, MD  Assessment/Plan: 80 y.o. male with a history of a Previous CVA without Residual Deficits, HTN, DM2, and Stage III CKD who presents to the ED with complaints of intermittent Chest Pain and palpitations, found to have pericardial effusion    1. Pericardial effusion -8/3: s/p pericardiocentesis 750 cc drained 08/03 (no organisms, mesothelial cells) -8/4-->8/5-->8/6: repeat echo: Moderatecircumferential effusion signs of tamponade. -8/7: s/p pericardial window: 500 cc of yellowish fluid removed; TEE, a left atrial appendage thrombus  -started on warfarin/AC; appreciate CT surgery, cardiology input   2. PAF likely underlying chronic afib with h/o CVA; now  left atrial appendage thrombus  -off Cardizem changed to PO amio ? Today; BB; on warfarin   3. Chest pain, resolved Patient was ruled out for acute ACS, denied any specific chest pain 3. Diabetes mellitus uncontrolled complicated with diabetic neuropathy, chronic left foot ulcer  - Hemoglobin A1c 5.9, continue sliding scale insulin  4. Chronic diabetic left foot ulcer: Follows Dr. Lajoyce Cornersuda  - Has Roland RackUnna boot dressing, weeping, hence placed on doxycycline, cont wound care  5. Hypertension: improving; started amlodipine, add BB if tolerated; prn hydralazine   6. Diarrhea improved C. Difficile negative;   7. Scrotal edema with inguinal hernia  - Scrotal ultrasound showed bowel containing left inguinal hernia admitting bowel into the scrotal sac.  - Surgery was consulted and recommended outpatient elective repair  8. Thrombocytopenia; Lovenox was on hold; platelets improved cont SCD; started warfarin;'  9. CKD (chronic kidney disease) stage 3, GFR 30-59 ml/min; gentle hydration, baseline Cr 1.4-1.6, cr at baseline  10. Anemia: Appears to be chronic, no precipitous drop, likely decrease from hemodilution  11. Constipation; already on  bowel regimen; try enema; abd exam unremarkable;   D/c plans : obtain PT eval   DVT prophylaxis: SCD; warfarin   Code Status: full Family Communication:  D/w patient, recently called updated Kreis,Mark Son [phone]   (indicate person spoken with, relationship, and if by phone, the number) Disposition Plan: pend clinical improvement    Consultants:  Cardiology  Procedures:  2-D echo  Pericardiocentesis Antibiotics:  Doxycycline   HPI/Subjective: alert  Objective: Filed Vitals:   11/10/13 0418  BP: 146/57  Pulse: 65  Temp: 98.4 F (36.9 C)  Resp: 18    Intake/Output Summary (Last 24 hours) at 11/10/13 0845 Last data filed at 11/10/13 [id number]  Gross per 24 hour  Intake 1775.48 ml  Output    550 ml  Net 1225.48 ml   Filed Weights   11/08/13 0540 11/09/13 0645 11/09/13 2006  Weight: 79.2 kg (174 lb 9.7 oz) 82 kg (180 lb 12.4 oz) 81.6 kg (179 lb 14.3 oz)    Exam:   General:  alert  Cardiovascular: s1,s2 irregular   Respiratory: CTA BL  Abdomen: soft, nt,nd   Musculoskeletal: L foot dressed   Data Reviewed: Basic Metabolic Panel:  Recent Labs Lab 11/07/13 1340 11/08/13 0430 11/09/13 0340  NA 139 138 135*  K 4.1 5.0 4.3  CL 103 105 104  CO2  --  20 20  GLUCOSE 129* 125* 125*  BUN 27* 27* 25*  CREATININE 1.40* 1.24 1.42*  CALCIUM  --  8.0* 7.6*  MG  --  1.9  --    Liver Function Tests:  Recent Labs Lab 11/09/13 0340  AST 9  ALT 7  ALKPHOS 80  BILITOT 0.8  PROT 5.1*  ALBUMIN 1.7*   No  results found for this basename: LIPASE, AMYLASE,  in the last 168 hours No results found for this basename: AMMONIA,  in the last 168 hours CBC:  Recent Labs Lab 11/06/13 0612 11/07/13 1300 11/07/13 1340 11/08/13 0430 11/09/13 0340  WBC 5.8 4.7  --  5.2 5.7  HGB 9.0* 8.9* 9.2* 8.7* 8.7*  HCT 26.9* 26.7* 27.0* 26.6* 26.5*  MCV 84.9 83.4  --  84.4 85.8  PLT 157 183  --  189 183   Cardiac Enzymes: No results found for this basename:  CKTOTAL, CKMB, CKMBINDEX, TROPONINI,  in the last 168 hours BNP (last 3 results) No results found for this basename: PROBNP,  in the last 8760 hours CBG:  Recent Labs Lab 11/09/13 0736 11/09/13 1312 11/09/13 1642 11/09/13 2054 11/10/13 0600  GLUCAP 131* 146* 149* 121* 111*    Recent Results (from the past 240 hour(s))  CLOSTRIDIUM DIFFICILE BY PCR     Status: None   Collection Time    11/02/13  9:17 AM      Result Value Ref Range Status   C difficile by pcr NEGATIVE  NEGATIVE Final  BODY FLUID CULTURE     Status: None   Collection Time    11/03/13 11:50 AM      Result Value Ref Range Status   Specimen Description FLUID PERICARDIAL   Final   Special Requests NONE   Final   Gram Stain     Final   Value: RARE WBC PRESENT, PREDOMINANTLY MONONUCLEAR     NO ORGANISMS SEEN     Performed at [facility]   Culture     Final   Value: NO GROWTH 3 DAYS     Performed at [facility]   Report Status 11/06/2013 FINAL   Final  BODY FLUID CULTURE     Status: None   Collection Time    11/07/13  2:55 PM      Result Value Ref Range Status   Specimen Description PERICARDIAL FLUID   Final   Special Requests PATIENT ON FOLLOWING DOXYCYCLIN   Final   Gram Stain     Final   Value: NO WBC SEEN     NO ORGANISMS SEEN     Performed at [facility]   Culture     Final   Value: NO GROWTH 2 DAYS     Performed at [facility]   Report Status PENDING   Incomplete  AFB CULTURE WITH SMEAR     Status: None   Collection Time    11/07/13  2:55 PM      Result Value Ref Range Status   Specimen Description PERICARDIAL FLUID   Final   Special Requests PATIENT ON FOLLOWING DOXYCYCLIN   Final   Acid Fast Smear     Final   Value: NO ACID FAST BACILLI SEEN     Performed at [facility]   Culture     Final   Value: CULTURE WILL BE EXAMINED FOR 6 WEEKS BEFORE ISSUING A FINAL REPORT     Performed at [facility]   Report Status PENDING   Incomplete  FUNGUS  CULTURE W SMEAR     Status: None   Collection Time    11/07/13  2:55 PM      Result Value Ref Range Status   Specimen Description PERICARDIAL FLUID   Final   Special Requests PATIENT ON FOLLOWING DOXYCYCLIN   Final   Fungal Smear  Final   Value: NO YEAST OR FUNGAL ELEMENTS SEEN     Performed at [facility]   Culture     Final   Value: CULTURE IN PROGRESS FOR FOUR WEEKS     Performed at [facility]   Report Status PENDING   Incomplete  ANAEROBIC CULTURE     Status: None   Collection Time    11/07/13  2:55 PM      Result Value Ref Range Status   Specimen Description PERICARDIAL FLUID   Final   Special Requests PATIENT ON FOLLOWING DOXYCYCLIN   Final   Gram Stain PENDING   Incomplete   Culture     Final   Value: NO ANAEROBES ISOLATED; CULTURE IN PROGRESS FOR 5 DAYS     Performed at [facility]   Report Status PENDING   Incomplete     Studies: Dg Chest Port 1 View  11/09/2013   CLINICAL DATA:  80 year old male -status post pericardial window for large pericardial effusions/tamponade.  EXAM: PORTABLE CHEST - 1 VIEW  COMPARISON:  11/07/2013 and prior chest radiographs  FINDINGS: This is a low volume film.  Bibasilar opacities probably represent atelectasis.  Decreased pulmonary vascular congestion noted.  A right IJ central venous catheter is identified with tip overlying the mid -lower SVC  There is no evidence of pneumothorax.  Enlargement of the cardiopericardial silhouette is again noted but appears smaller since compared to 11/01/2013.  A pericardial drain is again noted.  IMPRESSION: Low volume film with increasing bibasilar opacities -suspect atelectasis.  No evidence of pneumothorax.   Electronically Signed   By: Laveda Abbe M.D.   On: 11/09/2013 08:22    Scheduled Meds: . acetaminophen  1,000 mg Oral 4 times per day   Or  . acetaminophen (TYLENOL) oral liquid 160 mg/5 mL  1,000 mg Oral 4 times per day  . amLODipine  5 mg Oral Daily  . aspirin EC  81 mg  Oral Daily  . bisacodyl  10 mg Oral Daily  . doxycycline  100 mg Oral Q12H  . ferrous sulfate  325 mg Oral TID WC  . insulin aspart  0-5 Units Subcutaneous QHS  . insulin aspart  0-9 Units Subcutaneous TID WC  . senna-docusate  1 tablet Oral BID  . simvastatin  10 mg Oral Q lunch  . warfarin  5 mg Oral q1800  . Warfarin - Pharmacist Dosing Inpatient   Does not apply q1800   Continuous Infusions: . sodium chloride 75 mL/hr at 11/09/13 0535  . amiodarone 30 mg/hr (11/10/13 0516)    Principal Problem:   Chest pain Active Problems:   Diabetes mellitus without complication   Hypertension   CKD (chronic kidney disease) stage 3, GFR 30-59 ml/min   Anemia of chronic disease   PAD (peripheral artery disease)   Pericardial effusion   Ulcer of foot, chronic   Thrombocytopenia   Diarrhea   Scrotal edema   Atrial fibrillation with rapid ventricular response    Time spent: >35 minutes     Esperanza Sheets  [facility] Pager [phone]. If 7PM-7AM, please contact night-coverage at [email or web address], password [facility] 11/10/2013, 8:45 AM  LOS: 9 days

## 2013-11-10 NOTE — Progress Notes (Addendum)
Patient Name: [name] Date of Encounter: 11/10/2013  Principal Problem:   Chest pain Active Problems:   Diabetes mellitus without complication   Hypertension   CKD (chronic kidney disease) stage 3, GFR 30-59 ml/min   Anemia of chronic disease   PAD (peripheral artery disease)   Pericardial effusion   Ulcer of foot, chronic   Thrombocytopenia   Diarrhea   Scrotal edema   Atrial fibrillation with rapid ventricular response    Patient Profile: 80 yo male w/ hx peric effusion, CVA, DM, HTN, CKD, was admitted 08/02 with chest pain. Cards saw, effusion now w/ tamponade, 750 cc drained 08/03. Pt developed rapid afib. Fluid re-accumulated and TCTS did peric window 08/07. On coumadin for LV thrombus and atrial fib.  SUBJECTIVE: Very weak, says nauseated after breakfast yesterday. Otherwise says doing better.   OBJECTIVE Filed Vitals:   11/09/13 1900 11/09/13 2006 11/10/13 0011 11/10/13 0418  BP: 149/58 155/63 141/61 146/57  Pulse:  64 66 65  Temp:  98.4 F (36.9 C) 97.4 F (36.3 C) 98.4 F (36.9 C)  TempSrc:  Oral Oral Oral  Resp: 19 18 18 18   Height:  5\' 7"  (1.702 m)    Weight:  179 lb 14.3 oz (81.6 kg)    SpO2:  99% 98% 98%    Intake/Output Summary (Last 24 hours) at 11/10/13 [id number] Last data filed at 11/10/13 0502  Gross per 24 hour  Intake 1625.7 ml  Output    680 ml  Net  945.7 ml   Filed Weights   11/08/13 0540 11/09/13 0645 11/09/13 2006  Weight: 174 lb 9.7 oz (79.2 kg) 180 lb 12.4 oz (82 kg) 179 lb 14.3 oz (81.6 kg)    PHYSICAL EXAM General: Well developed, well nourished, male in no acute distress. Head: Normocephalic, atraumatic.  Neck: Supple without bruits, JVD 8 cm. Lungs:  Resp regular and unlabored, decreased BS bases, few rales. Heart: RRR, S1, S2, no S3, S4, soft murmur; no rub. Abdomen: Soft, non-tender, non-distended, BS + x 4.  Extremities: No clubbing, cyanosis, no edema. LLE in ACE wrap Neuro: Alert and oriented X 2. Moves all  extremities spontaneously. Psych: Normal affect.  LABS: CBC: Recent Labs  11/08/13 0430 11/09/13 0340  WBC 5.2 5.7  HGB 8.7* 8.7*  HCT 26.6* 26.5*  MCV 84.4 85.8  PLT 189 183   INR: Recent Labs  11/10/13 0500  INR 2.04*   Basic Metabolic Panel: Recent Labs  11/08/13 0430 11/09/13 0340  NA 138 135*  K 5.0 4.3  CL 105 104  CO2 20 20  GLUCOSE 125* 125*  BUN 27* 25*  CREATININE 1.24 1.42*  CALCIUM 8.0* 7.6*  MG 1.9  --    Liver Function Tests: Recent Labs  11/09/13 0340  AST 9  ALT 7  ALKPHOS 80  BILITOT 0.8  PROT 5.1*  ALBUMIN 1.7*   TELE:       Radiology/Studies: Dg Chest Port 1 View 11/09/2013   CLINICAL DATA:  80 year old male -status post pericardial window for large pericardial effusions/tamponade.  EXAM: PORTABLE CHEST - 1 VIEW  COMPARISON:  11/07/2013 and prior chest radiographs  FINDINGS: This is a low volume film.  Bibasilar opacities probably represent atelectasis.  Decreased pulmonary vascular congestion noted.  A right IJ central venous catheter is identified with tip overlying the mid -lower SVC  There is no evidence of pneumothorax.  Enlargement of the cardiopericardial silhouette is again noted but appears smaller since compared to 11/01/2013.  A pericardial drain is again noted.  IMPRESSION: Low volume film with increasing bibasilar opacities -suspect atelectasis.  No evidence of pneumothorax.   Electronically Signed   By: Laveda Abbe M.D.   On: 11/09/2013 08:22     Current Medications:  . acetaminophen  1,000 mg Oral 4 times per day   Or  . acetaminophen (TYLENOL) oral liquid 160 mg/5 mL  1,000 mg Oral 4 times per day  . amLODipine  5 mg Oral Daily  . aspirin EC  81 mg Oral Daily  . bisacodyl  10 mg Oral Daily  . doxycycline  100 mg Oral Q12H  . ferrous sulfate  325 mg Oral TID WC  . insulin aspart  0-5 Units Subcutaneous QHS  . insulin aspart  0-9 Units Subcutaneous TID WC  . senna-docusate  1 tablet Oral BID  . simvastatin  10 mg Oral Q  lunch  . warfarin  5 mg Oral q1800  . Warfarin - Pharmacist Dosing Inpatient   Does not apply q1800   . sodium chloride 75 mL/hr at 11/09/13 0535  . amiodarone 30 mg/hr (11/10/13 0516)    ASSESSMENT AND PLAN: Chest pain - improved, ez negative for MI, felt secondary to peric effusion   Pericardial effusion - s/p /p pericardiocentesis effusion for tamponade, 750 cc drained 08/03, window for re-accumulation. Exudative by protein level, few WBCs and no organisms seen, reports are incomplete. Continue to follow.   Atrial fibrillation, rapid ventricular response - Initially on IV Cardizem. Pt converted to SR with 6 sec pause. Also had sinus brady. Dilt drip stopped. Maintaining SR   Anticoagulation - for afib, LA thrombus, CHADsVASC score is elevated, coumadin is therapeutic  DM - on SSI, per IM  HTN - SBP 130s-160s on current rx, per IM, can increase amlodipine to 7.5 mg daily, then go to 10 mg if tolerated well.  CKD (chronic kidney disease) stage 3, GFR 30-59 ml/min -  per IM, values 08/09 actually below baseline    Anemia of chronic disease -   per IM, H&H a little lower than normal for him  PAD (peripheral artery disease)/Ulcer of foot, chronic -  per IM, Profore wrap to be changed weekly on Thursday  Thrombocytopenia - per IM,  Improved, No bleeding issues, follow.    Diarrhea -  per IM, Improved     Scrotal edema -  per IM  Signed, Theodore Demark , PA-C 6:27 AM 11/10/2013

## 2013-11-10 NOTE — Progress Notes (Addendum)
      [address]       [name] [address]             [phone]      3 Days Post-Op Procedure(s) (LRB): SUBXYPHOID PERICARDIAL WINDOW (N/A)  Subjective:  Mr. Louvier has no complaints.  Pericardial drain removed yesterday  Objective: Vital signs in last 24 hours: Temp:  [97.4 F (36.3 C)-98.4 F (36.9 C)] 98.4 F (36.9 C) (08/10 0418) Pulse Rate:  [46-81] 70 (08/10 1003) Cardiac Rhythm:  [-] Normal sinus rhythm (08/10 0855) Resp:  [14-22] 18 (08/10 0418) BP: (133-161)/(52-63) 161/55 mmHg (08/10 1003) SpO2:  [96 %-99 %] 98 % (08/10 0418) Arterial Line BP: (150-167)/(52-61) 167/57 mmHg (08/09 1700) Weight:  [179 lb 14.3 oz (81.6 kg)] 179 lb 14.3 oz (81.6 kg) (08/09 2006)  Intake/Output from previous day: 08/09 0701 - 08/10 0700 In: 1867.2 [I.V.:1867.2] Out: 580 [Urine:580] Intake/Output this shift: Total I/O In: 240 [P.O.:240] Out: 150 [Urine:150]  General appearance: alert, cooperative and no distress Heart: irregularly irregular rhythm Wound: clean and dry   Lab Results:  Recent Labs  11/08/13 0430 11/09/13 0340  WBC 5.2 5.7  HGB 8.7* 8.7*  HCT 26.6* 26.5*  PLT 189 183   BMET:  Recent Labs  11/08/13 0430 11/09/13 0340  NA 138 135*  K 5.0 4.3  CL 105 104  CO2 20 20  GLUCOSE 125* 125*  BUN 27* 25*  CREATININE 1.24 1.42*  CALCIUM 8.0* 7.6*    PT/INR:  Recent Labs  11/10/13 0500  LABPROT 23.0*  INR 2.04*   ABG    Component Value Date/Time   PHART 7.378 11/08/2013 0455   HCO3 21.8 11/08/2013 0455   TCO2 23 11/08/2013 0455   ACIDBASEDEF 3.0* 11/08/2013 0455   O2SAT 95.0 11/08/2013 0455   CBG (last 3)   Recent Labs  11/09/13 2054 11/10/13 0600 11/10/13 1122  GLUCAP 121* 111* 132*    Assessment/Plan: S/P Procedure(s) (LRB): SUBXYPHOID PERICARDIAL WINDOW (N/A)  1. Pericardial drain has been removed, needs follow up CXR will place order 2. Will follow intermittently, will make follow up appointment for 2 weeks from  hospital discharge   LOS: 9 days    BARRETT, ERIN 11/10/2013  I have seen and examined [name] and agree with the above assessment  and plan.  Delight Ovens MD Beeper [phone] Office [phone] 11/10/2013 1:51 PM

## 2013-11-11 ENCOUNTER — Telehealth: Payer: Self-pay | Admitting: *Deleted

## 2013-11-11 ENCOUNTER — Ambulatory Visit: Payer: Self-pay | Admitting: Neurology

## 2013-11-11 LAB — BODY FLUID CULTURE
Culture: NO GROWTH
Gram Stain: NONE SEEN

## 2013-11-11 LAB — TYPE AND SCREEN
ABO/RH(D): O POS
ANTIBODY SCREEN: NEGATIVE
UNIT DIVISION: 0
UNIT DIVISION: 0

## 2013-11-11 LAB — GLUCOSE, CAPILLARY
GLUCOSE-CAPILLARY: 126 mg/dL — AB (ref 70–99)
Glucose-Capillary: 110 mg/dL — ABNORMAL HIGH (ref 70–99)
Glucose-Capillary: 118 mg/dL — ABNORMAL HIGH (ref 70–99)
Glucose-Capillary: 123 mg/dL — ABNORMAL HIGH (ref 70–99)

## 2013-11-11 LAB — PROTIME-INR
INR: 3.17 — AB (ref 0.00–1.49)
Prothrombin Time: 32.5 seconds — ABNORMAL HIGH (ref 11.6–15.2)

## 2013-11-11 NOTE — Telephone Encounter (Signed)
Patient no showed on 11-11-2013.Marland KitchenRR

## 2013-11-11 NOTE — Evaluation (Signed)
Occupational Therapy Evaluation Patient Details Name: Barry LawmanRobert Schueller MRN: [id number] DOB: 12/03/1933 Today's Date: 11/11/2013    History of Present Illness Presented to ED with c/o intermittent chest pain and palpations.  Found to have pericardial effusion.  Pericardiocentesis performed 11/03/13 and pericardial drain removed 11/09/13.   Clinical Impression   Patient reports that he uses a walker at home and has his wife who can assist him if needed as well as 2 local adult children.  After further assessment, wife uses a walker and has h/o CVA and both children work.  Patient reports that his wife assists with donning his left boot and occasionally assists with his right shoe "because I can't feel my foot from the diabetes".  Unsure that patient is best historian of his prior level of function and his wife's ability to assist at home therefore recommend further therapy at SNF or CIR level before patient returns home.  Patient reports that he will have to check with his wife and children first before he can agree to this.  Patient present with need for cues for safety, assist for sit><stand especially from low surface and decreased activity tolerance.    Follow Up Recommendations  CIR;SNF;Home health OT (recommend therapy before return home, patient reports need to discuss with wife and children)    Equipment Recommendations  3 in 1 bedside comode    Recommendations for Other Services Rehab consult     Precautions / Restrictions Precautions Precautions: Fall Precaution Comments: unna boot on LLE Restrictions Other Position/Activity Restrictions: Has boot that he wears at home      Mobility Bed Mobility Overal bed mobility: Needs Assistance Bed Mobility: Supine to Sit     Supine to sit: Min assist     General bed mobility comments: A to get trunk upright  Transfers Overall transfer level: Needs assistance Equipment used: Rolling walker (2 wheeled) Transfers: Sit to/from Stand  (especially fro low surfaces) Sit to Stand: Mod assist         General transfer comment: Cues for hand placement. Lift assistance to get off standard commode without use of grab bar secondary to he does not have one at home.    Balance Overall balance assessment: Needs assistance Sitting-balance support: Feet supported Sitting balance-Leahy Scale: Good     Standing balance support: Single extremity supported;Bilateral upper extremity supported;No upper extremity supported;During functional activity Standing balance-Leahy Scale: Fair Standing balance comment: using RW        ADL Overall ADL's : Needs assistance/impaired     Grooming: Wash/dry hands;Standing   Upper Body Dressing : Total assistance;Sitting Upper Body Dressing Details (indicate cue type and reason): socks, right shoe and left walking boot with velcro     Toilet Transfer: Moderate assistance;Stand-pivot;Ambulation;Regular Toilet;RW Toilet Transfer Details (indicate cue type and reason): required lift assistance off the commode.  PAtient reports that he does not have a grab bar at home and he uses his walker to stand up-was unable to stand up without assistance. Toileting- ArchitectClothing Manipulation and Hygiene: Min guard;Sit to/from stand            Pertinent Vitals/Pain  Denies pain     Hand Dominance Right   Extremity/Trunk Assessment Upper Extremity Assessment Upper Extremity Assessment: Generalized weakness   Lower Extremity Assessment Lower Extremity Assessment: Defer to PT evaluation   Cervical / Trunk Assessment Cervical / Trunk Assessment: Kyphotic   Communication Communication Communication: No difficulties   Cognition Arousal/Alertness: Awake/alert Behavior During Therapy: WFL for tasks assessed/performed Overall Cognitive  Status: Within Functional Limits for tasks assessed       Home Living Family/patient expects to be discharged to:: Private residence Living Arrangements:  Spouse/significant other;Children Available Help at Discharge: Available 24 hours/day;Family (children work days and wife with h/o CVA and uses a walker) Type of Home: House Home Access: Stairs to enter Secretary/administrator of Steps: 4 Entrance Stairs-Rails: Left Home Layout: One level         Firefighter: Standard     Home Equipment: Walker - 2 wheels   Additional Comments: PTA: patient took sponge bathes. Recommend grab bar at toilet and unclear if patient has 3 in 1 commode      Prior Functioning/Environment Level of Independence: Independent with assistive device(s);Needs assistance    ADL's / Homemaking Assistance Needed: patient reports that his wife can and occasionally does assist with LB dressing.  Unclear if patient is good historian.        OT Diagnosis: Generalized weakness   OT Problem List: Decreased strength;Decreased activity tolerance;Impaired balance (sitting and/or standing);Decreased knowledge of use of DME or AE   OT Treatment/Interventions: Self-care/ADL training;Therapeutic exercise;Energy conservation;DME and/or AE instruction;Therapeutic activities;Patient/family education;Balance training    OT Goals(Current goals can be found in the care plan section) Acute Rehab OT Goals Patient Stated Goal: to return home. OT Goal Formulation: With patient Time For Goal Achievement: 11/25/13 Potential to Achieve Goals: Good  OT Frequency: Min 2X/week   Barriers to D/C: Decreased caregiver support  Wife with h/o CVA and uses walker (per patient) and the 2 adult children who are local-both work.       End of Session Equipment Utilized During Treatment: Rolling walker  Activity Tolerance: Patient tolerated treatment well Patient left: with call bell/phone within reach;in chair (Physical Therapy on her way in )   Time: 7169-6789 OT Time Calculation (min): 24 min Charges:  OT General Charges $OT Visit: 1 Procedure OT Evaluation $Initial OT  Evaluation Tier I: 1 Procedure OT Treatments $Self Care/Home Management : 8-22 mins G-Codes:    Daysha Ashmore 11-20-2013, 3:05 PM

## 2013-11-11 NOTE — Consult Note (Addendum)
WOC wound follow-up consult note  Reason for Consult: Drainage has leaked through compression wrap to left leg; Pt is followed by Dr Lajoyce Corners as outpatient prior to admission and Profore wraps are changed Q week. Current wraps were applied on Thursday.  No change in appearance or size of wounds since previous assessment. left plantar foot:0.2cmx.0.2cmx0.2cm; left heel: 0.1cmx0.1cmx0.1cm, fissure to left outer foot 4X.2X.1cm, red and moist. Wound QZR:AQTM foot:100% pink; left heel: able to palpate bone with swab to both sites; pt states Dr Lajoyce Corners removed a pin. Drainage: copious purulent serosanguinous drainage, with strong odor, soaked through 4 layers Periwound: skin intact with calloused margins  Wound type:full thickness Drainage: copious purulent serosanguinous drainage, with strong odor, soaked through 4 layers  Applied Aquacel over wounds to absorb drainage and provide antimicrobial benefits, then 4 layer Profore wraps applied. Pt can resume follow-up with Dr Lajoyce Corners after discharge or [facility] nurse will plan to change compression wraps next Tuesday if still in the hospital at that time.  Cammie Mcgee MSN, RN, CWOCN, [place], CNS  [phone]

## 2013-11-11 NOTE — Progress Notes (Signed)
Rehab Admissions Coordinator Note:  Patient was screened by Trish Mage for appropriateness for an Inpatient Acute Rehab Consult.  At this time, we are recommending Inpatient Rehab consult.  Trish Mage 11/11/2013, 4:14 PM  I can be reached at [phone].

## 2013-11-11 NOTE — Progress Notes (Signed)
ANTICOAGULATION CONSULT NOTE   Pharmacy Consult for Coumadin Indication: atrial fibrillation, left atrial thrombus  No Known Allergies  Patient Measurements: Height: 5\' 7"  (170.2 cm) Weight: 179 lb 14.3 oz (81.6 kg) IBW/kg (Calculated) : 66.1  Vital Signs: Temp: 98.1 F (36.7 C) (08/11 0436) Temp src: Oral (08/11 0436) BP: 146/93 mmHg (08/11 0436) Pulse Rate: 60 (08/11 0436)  Labs:  Recent Labs  11/09/13 0340 11/10/13 0500 11/11/13 0350  HGB 8.7*  --   --   HCT 26.5*  --   --   PLT 183  --   --   LABPROT 15.7* 23.0* 32.5*  INR 1.25 2.04* 3.17*  CREATININE 1.42*  --   --     Estimated Creatinine Clearance: 43.8 ml/min (by C-G formula based on Cr of 1.42).  Assessment: 80 year old male continues Coumadin for Afib and left atrial thrombus.  S/p pericardial window 8/7  Protime increased again by 9.5 sec  No bleeding noted  Goal of Therapy:  INR 2-3 Monitor platelets by anticoagulation protocol: Yes   Plan:  1) No coumadin today 2) Daily INR 3) Coumadin education.  Thank you. Talbert Cage, PharmD [phone] 11/11/2013,9:04 AM

## 2013-11-11 NOTE — Progress Notes (Signed)
Physical Therapy Treatment Patient Details Name: Barry LawmanRobert White MRN: [id number] DOB: 11/18/1933 Today's Date: 11/11/2013    History of Present Illness Presented to ED with c/o intermittent chest pain and palpations.  Found to have pericardial effusion.  Pericardiocentesis performed 11/03/13 and pericardial drain removed 11/09/13.    PT Comments    Pt continues to demonstrate decreased balance, decreased sensation in BLE, and decreased activity tolerance.  Note continues to have increased drainage from LLE and WOC RN in room to change dressing.  Had discussion with pt regarding pts ability to manage at home with wife who also uses RW.  Discussed going to CIR vs SNF prior to D/C home for increased safety, independence and medical management.  Pt states he would discuss with family.  CSW notified.   Follow Up Recommendations  SNF;Supervision/Assistance - 24 hour;CIR     Equipment Recommendations  None recommended by PT    Recommendations for Other Services       Precautions / Restrictions Precautions Precautions: Fall Precaution Comments: unna boot on LLE Restrictions Weight Bearing Restrictions: Yes LLE Weight Bearing: Weight bearing as tolerated Other Position/Activity Restrictions: Has boot that he wears at home    Mobility  Bed Mobility Overal bed mobility: Needs Assistance Bed Mobility: Sit to Supine     Supine to sit: Min assist Sit to supine: Min assist   General bed mobility comments: Assist for LLE to get back into bed.   Transfers Overall transfer level: Needs assistance Equipment used: Rolling walker (2 wheeled) Transfers: Sit to/from Stand Sit to Stand: Min assist         General transfer comment: Pt requires mod cues for hand placement and safety to keep RW with him until all the way at seating surface. Also requires assist for increased forward weight shift.   Ambulation/Gait Ambulation/Gait assistance: Min guard Ambulation Distance (Feet): 70 Feet (x 2  reps) Assistive device: Rolling walker (2 wheeled) Gait Pattern/deviations: Step-through pattern;Decreased stride length;Trunk flexed;Narrow base of support     General Gait Details: Pt continues to demonstrate forward flexed posture with balance deficits esp due to decreased sensation in R and LLE.  Does better with use of RW, however does require intermittent standing breaks due to fatigue.  Pts sats remained in 90's throughout on RA and HR to 76.    Stairs            Wheelchair Mobility    Modified Rankin (Stroke Patients Only)       Balance Overall balance assessment: Needs assistance Sitting-balance support: Feet supported Sitting balance-Leahy Scale: Good     Standing balance support: During functional activity;Bilateral upper extremity supported Standing balance-Leahy Scale: Fair Standing balance comment: using RW                    Cognition Arousal/Alertness: Awake/alert Behavior During Therapy: WFL for tasks assessed/performed Overall Cognitive Status: Within Functional Limits for tasks assessed                      Exercises General Exercises - Lower Extremity Ankle Circles/Pumps: AROM;Both;10 reps Quad Sets: Strengthening;Both;10 reps Hip ABduction/ADduction: Strengthening;Both;10 reps Straight Leg Raises: Strengthening;Both;10 reps    General Comments General comments (skin integrity, edema, etc.): Noted odor upon entry of the room from LLE wound. WOC RN present to re-dress draining wound however reported she would come back after session.      Pertinent Vitals/Pain      Home Living Family/patient expects to be discharged  to:: Private residence Living Arrangements: Spouse/significant other;Children Available Help at Discharge: Available 24 hours/day;Family (children work days and wife with h/o CVA and uses a walker) Type of Home: House Home Access: Stairs to enter Entrance Stairs-Rails: Left Home Layout: One level Home  Equipment: Walker - 2 wheels Additional Comments: PTA: patient took sponge bathes. Recommend grab bar at toilet and unclear if patient has 3 in 1 commode    Prior Function Level of Independence: Independent with assistive device(s);Needs assistance    ADL's / Homemaking Assistance Needed: patient reports that his wife can and occasionally does assist with LB dressing.  Unclear if patient is good historian.     PT Goals (current goals can now be found in the care plan section) Acute Rehab PT Goals Patient Stated Goal: to return home. PT Goal Formulation: With patient Time For Goal Achievement: 11/15/13 Potential to Achieve Goals: Good Progress towards PT goals: Progressing toward goals    Frequency  Min 3X/week    PT Plan Discharge plan needs to be updated    Co-evaluation             End of Session   Activity Tolerance: Patient tolerated treatment well;Patient limited by fatigue Patient left: in bed;with call bell/phone within reach;with nursing/sitter in room     Time: 1696-7893 PT Time Calculation (min): 30 min  Charges:  $Gait Training: 8-22 mins $Therapeutic Exercise: 8-22 mins                    G Codes:      [name] 11/11/2013, 3:13 PM

## 2013-11-11 NOTE — Progress Notes (Signed)
[facility] PROGRESS NOTE  Barry LawmanRobert White UEA:[id number] DOB: 08/03/1933 DOA: 11/01/2013 PCP: Dorrene GermanAVBUERE,EDWIN A, MD  Assessment/Plan: 80 y.o. male with a history of a Previous CVA without Residual Deficits, HTN, DM2, and Stage III CKD who presents to the ED with complaints of intermittent Chest Pain and palpitations, found to have pericardial effusion    1. Pericardial effusion -8/3: s/p pericardiocentesis 750 cc drained 08/03 (no organisms, mesothelial cells) -8/4-->8/5-->8/6: repeat echo: Moderatecircumferential effusion signs of tamponade. -8/7: s/p pericardial window: 500 cc of yellowish fluid removed; TEE, a left atrial appendage thrombus  -started on warfarin/AC; appreciate CT surgery, cardiology input   2. PAF likely underlying chronic afib with h/o CVA; now  left atrial appendage thrombus  -off Cardizem changed to PO amio; BB; on warfarin   3. Chest pain, resolved Patient was ruled out for acute ACS, denied any specific chest pain 3. Diabetes mellitus uncontrolled complicated with diabetic neuropathy, chronic left foot ulcer - Hemoglobin A1c 5.9, continue sliding scale insulin  4. Chronic diabetic left foot ulcer: Follows Dr. Lajoyce Cornersuda  - Has Roland RackUnna boot dressing, weeping, hence placed on doxycycline, cont wound care  5. Hypertension: improving; started amlodipine, add BB if tolerated; prn hydralazine   6. Diarrhea improved C. Difficile negative;   7. Scrotal edema with inguinal hernia  - Scrotal ultrasound showed bowel containing left inguinal hernia admitting bowel into the scrotal sac.  - Surgery was consulted and recommended outpatient elective repair  8. Thrombocytopenia; Lovenox was on hold; platelets improved cont SCD; started warfarin;'  9. CKD (chronic kidney disease) stage 3, GFR 30-59 ml/min; gentle hydration, baseline Cr 1.4-1.6, cr at baseline  10. Anemia: Appears to be chronic, no precipitous drop, likely decrease from hemodilution 11. Constipation; already on bowel  regimen; try enema; abd exam unremarkable;  -resolved   D/c plans : obtain PT eval deconditioned; likely needs rehab    DVT prophylaxis: SCD; warfarin   Code Status: full Family Communication:  D/w patient, recently called updated Pohle,Mark Son [phone]   (indicate person spoken with, relationship, and if by phone, the number) Disposition Plan: pend clinical improvement    Consultants:  Cardiology  Procedures:  2-D echo  Pericardiocentesis Antibiotics:  Doxycycline   HPI/Subjective: alert  Objective: Filed Vitals:   11/11/13 0436  BP: 146/93  Pulse: 60  Temp: 98.1 F (36.7 C)  Resp: 16    Intake/Output Summary (Last 24 hours) at 11/11/13 0923 Last data filed at 11/11/13 0436  Gross per 24 hour  Intake    600 ml  Output    350 ml  Net    250 ml   Filed Weights   11/08/13 0540 11/09/13 0645 11/09/13 2006  Weight: 79.2 kg (174 lb 9.7 oz) 82 kg (180 lb 12.4 oz) 81.6 kg (179 lb 14.3 oz)    Exam:   General:  alert  Cardiovascular: s1,s2 irregular   Respiratory: CTA BL  Abdomen: soft, nt,nd   Musculoskeletal: L foot dressed   Data Reviewed: Basic Metabolic Panel:  Recent Labs Lab 11/07/13 1340 11/08/13 0430 11/09/13 0340  NA 139 138 135*  K 4.1 5.0 4.3  CL 103 105 104  CO2  --  20 20  GLUCOSE 129* 125* 125*  BUN 27* 27* 25*  CREATININE 1.40* 1.24 1.42*  CALCIUM  --  8.0* 7.6*  MG  --  1.9  --    Liver Function Tests:  Recent Labs Lab 11/09/13 0340  AST 9  ALT 7  ALKPHOS 80  BILITOT 0.8  PROT 5.1*  ALBUMIN 1.7*   No results found for this basename: LIPASE, AMYLASE,  in the last 168 hours No results found for this basename: AMMONIA,  in the last 168 hours CBC:  Recent Labs Lab 11/06/13 0612 11/07/13 1300 11/07/13 1340 11/08/13 0430 11/09/13 0340  WBC 5.8 4.7  --  5.2 5.7  HGB 9.0* 8.9* 9.2* 8.7* 8.7*  HCT 26.9* 26.7* 27.0* 26.6* 26.5*  MCV 84.9 83.4  --  84.4 85.8  PLT 157 183  --  189 183   Cardiac  Enzymes: No results found for this basename: CKTOTAL, CKMB, CKMBINDEX, TROPONINI,  in the last 168 hours BNP (last 3 results) No results found for this basename: PROBNP,  in the last 8760 hours CBG:  Recent Labs Lab 11/10/13 0600 11/10/13 1122 11/10/13 1624 11/10/13 2111 11/11/13 0611  GLUCAP 111* 132* 142* 122* 110*    Recent Results (from the past 240 hour(s))  CLOSTRIDIUM DIFFICILE BY PCR     Status: None   Collection Time    11/02/13  9:17 AM      Result Value Ref Range Status   C difficile by pcr NEGATIVE  NEGATIVE Final  BODY FLUID CULTURE     Status: None   Collection Time    11/03/13 11:50 AM      Result Value Ref Range Status   Specimen Description FLUID PERICARDIAL   Final   Special Requests NONE   Final   Gram Stain     Final   Value: RARE WBC PRESENT, PREDOMINANTLY MONONUCLEAR     NO ORGANISMS SEEN     Performed at [facility]   Culture     Final   Value: NO GROWTH 3 DAYS     Performed at [facility]   Report Status 11/06/2013 FINAL   Final  BODY FLUID CULTURE     Status: None   Collection Time    11/07/13  2:55 PM      Result Value Ref Range Status   Specimen Description PERICARDIAL FLUID   Final   Special Requests PATIENT ON FOLLOWING DOXYCYCLIN   Final   Gram Stain     Final   Value: NO WBC SEEN     NO ORGANISMS SEEN     Performed at [facility]   Culture     Final   Value: NO GROWTH 3 DAYS     Performed at [facility]   Report Status PENDING   Incomplete  AFB CULTURE WITH SMEAR     Status: None   Collection Time    11/07/13  2:55 PM      Result Value Ref Range Status   Specimen Description PERICARDIAL FLUID   Final   Special Requests PATIENT ON FOLLOWING DOXYCYCLIN   Final   Acid Fast Smear     Final   Value: NO ACID FAST BACILLI SEEN     Performed at [facility]   Culture     Final   Value: CULTURE WILL BE EXAMINED FOR 6 WEEKS BEFORE ISSUING A FINAL REPORT     Performed at [facility]    Report Status PENDING   Incomplete  FUNGUS CULTURE W SMEAR     Status: None   Collection Time    11/07/13  2:55 PM      Result Value Ref Range Status   Specimen Description PERICARDIAL FLUID   Final   Special Requests PATIENT ON FOLLOWING DOXYCYCLIN  Final   Fungal Smear     Final   Value: NO YEAST OR FUNGAL ELEMENTS SEEN     Performed at [facility]   Culture     Final   Value: CULTURE IN PROGRESS FOR FOUR WEEKS     Performed at [facility]   Report Status PENDING   Incomplete  ANAEROBIC CULTURE     Status: None   Collection Time    11/07/13  2:55 PM      Result Value Ref Range Status   Specimen Description PERICARDIAL FLUID   Final   Special Requests PATIENT ON FOLLOWING DOXYCYCLIN   Final   Gram Stain PENDING   Incomplete   Culture     Final   Value: NO ANAEROBES ISOLATED; CULTURE IN PROGRESS FOR 5 DAYS     Performed at [facility]   Report Status PENDING   Incomplete     Studies: No results found.  Scheduled Meds: . acetaminophen  1,000 mg Oral 4 times per day   Or  . acetaminophen (TYLENOL) oral liquid 160 mg/5 mL  1,000 mg Oral 4 times per day  . amiodarone  400 mg Oral Daily  . amLODipine  5 mg Oral Daily  . aspirin EC  81 mg Oral Daily  . bisacodyl  10 mg Oral Daily  . doxycycline  100 mg Oral Q12H  . ferrous sulfate  325 mg Oral TID WC  . insulin aspart  0-5 Units Subcutaneous QHS  . insulin aspart  0-9 Units Subcutaneous TID WC  . metoprolol tartrate  12.5 mg Oral BID  . senna-docusate  1 tablet Oral BID  . simvastatin  10 mg Oral Q lunch  . Warfarin - Pharmacist Dosing Inpatient   Does not apply q1800   Continuous Infusions:    Principal Problem:   Chest pain Active Problems:   Diabetes mellitus without complication   Hypertension   CKD (chronic kidney disease) stage 3, GFR 30-59 ml/min   Anemia of chronic disease   PAD (peripheral artery disease)   Pericardial effusion   Ulcer of foot, chronic   Thrombocytopenia    Diarrhea   Scrotal edema   Atrial fibrillation with rapid ventricular response    Time spent: >35 minutes     Esperanza Sheets  [facility] Pager [phone]. If 7PM-7AM, please contact night-coverage at [email or web address], password [facility] 11/11/2013, 9:23 AM  LOS: 10 days

## 2013-11-12 ENCOUNTER — Inpatient Hospital Stay (HOSPITAL_COMMUNITY): Payer: Medicare Other

## 2013-11-12 DIAGNOSIS — R5381 Other malaise: Secondary | ICD-10-CM

## 2013-11-12 LAB — GLUCOSE, CAPILLARY
GLUCOSE-CAPILLARY: 127 mg/dL — AB (ref 70–99)
Glucose-Capillary: 113 mg/dL — ABNORMAL HIGH (ref 70–99)
Glucose-Capillary: 125 mg/dL — ABNORMAL HIGH (ref 70–99)
Glucose-Capillary: 130 mg/dL — ABNORMAL HIGH (ref 70–99)

## 2013-11-12 LAB — PROTIME-INR
INR: 3.27 — AB (ref 0.00–1.49)
Prothrombin Time: 33.3 seconds — ABNORMAL HIGH (ref 11.6–15.2)

## 2013-11-12 LAB — ANAEROBIC CULTURE: GRAM STAIN: NONE SEEN

## 2013-11-12 NOTE — Progress Notes (Signed)
Clinical Social Work Department BRIEF PSYCHOSOCIAL ASSESSMENT 11/12/2013  Patient:  [name], [name]     Account Number:  [id number]     Admit date:  11/01/2013  Clinical Social Worker:  Harless Nakayama  Date/Time:  11/12/2013 11:00 AM  Referred by:  Physician  Date Referred:  11/12/2013 Referred for  SNF Placement   Other Referral:   Interview type:  Patient Other interview type:   Spoke with pt and per pt request called son Loraine Leriche as well    PSYCHOSOCIAL DATA Living Status:  WIFE Admitted from facility:   Level of care:   Primary support name:  Loraine Leriche [phone] Primary support relationship to patient:  CHILD, ADULT Degree of support available:   Pt reports having strong support    CURRENT CONCERNS Current Concerns  Post-Acute Placement   Other Concerns:    SOCIAL WORK ASSESSMENT / PLAN CSW aware of recommendation for SNF and that CIR is also looking at patient. CSW visited pt room and spoke with pt. Pt confused at first because he was under the impression he would definitely be going upstairs. CSW explained that we would look at SNF as a backup incase CIR was unable to accept pt for any number of reasons. Pt was understanding of this. Pt did have a negative reaction to SNF at first but once CSW explained this would not be for long term care and instead for ST rehab and then pt would return home, pt was much more accepting. CSW explained SNF referral process and pt was agreeable to referral being sent to [facility]. Pt did express mutliple times that he would rather dc to CIR. CSW expressed understanding and confirmed SNF would just be a backup plan. CSW asked if any other family needed to be updated. Pt asked for CSW to call his son and wife. CSW spoke with pt son and he was very understanding. Pt son also hoping pt can dc to CIR because he is aware that pt and pt wife have a hard time accepting the idea of a SNF because of fear that is the end. CSW informed that pt  was agreeable once CSW helped explain ST rehab. Pt son informed CSW he would speak with pt wife and update on plan as well.   Assessment/plan status:  Psychosocial Support/Ongoing Assessment of Needs Other assessment/ plan:   Information/referral to community resources:   SNF list to be provided with bed offers (if needed)    PATIENT'S/FAMILY'S RESPONSE TO PLAN OF CARE: Pt and pt family very pleasant and coopeartive. They are agreeable to pursing SNF as backup to CIR.       Farhaan Mabee, LCSWA [phone]

## 2013-11-12 NOTE — Progress Notes (Addendum)
      [address]       [name] [address]             [phone]      5 Days Post-Op Procedure(s) (LRB): SUBXYPHOID PERICARDIAL WINDOW (N/A)  Subjective:  Mr. Sennett has no complaints this morning.  Objective: Vital signs in last 24 hours: Temp:  [97.3 F (36.3 C)-97.9 F (36.6 C)] 97.9 F (36.6 C) (08/12 0439) Pulse Rate:  [53-55] 53 (08/12 0439) Cardiac Rhythm:  [-] Sinus bradycardia (08/11 2041) Resp:  [16-18] 16 (08/12 0439) BP: (137-152)/(50-58) 142/55 mmHg (08/12 0439) SpO2:  [97 %-99 %] 97 % (08/12 0439)  Intake/Output from previous day: 08/11 0701 - 08/12 0700 In: 460 [P.O.:460] Out: 562 [Urine:560; Stool:2]  General appearance: alert, cooperative and no distress Heart: irregularly irregular rhythm Lungs: clear to auscultation bilaterally Wound: clean and dry  Lab Results: No results found for this basename: WBC, HGB, HCT, PLT,  in the last 72 hours BMET: No results found for this basename: NA, K, CL, CO2, GLUCOSE, BUN, CREATININE, CALCIUM,  in the last 72 hours  PT/INR:  Recent Labs  11/12/13 0529  LABPROT 33.3*  INR 3.27*   ABG    Component Value Date/Time   PHART 7.378 11/08/2013 0455   HCO3 21.8 11/08/2013 0455   TCO2 23 11/08/2013 0455   ACIDBASEDEF 3.0* 11/08/2013 0455   O2SAT 95.0 11/08/2013 0455   CBG (last 3)   Recent Labs  11/11/13 1703 11/11/13 2145 11/12/13 0609  GLUCAP 118* 123* 113*    Assessment/Plan: S/P Procedure(s) (LRB): SUBXYPHOID PERICARDIAL WINDOW (N/A)  1. Wound- clean and dry, will obtain baseline CXR post pericardial drain removal 2. Dispo- plan per primary, follow up appointment has been placed in chart   LOS: 11 days    BARRETT, ERIN 11/12/2013  Free air lily related to air getting into abdomen with pericardial window I have seen and examined [name] and agree with the above assessment  and plan.  Delight Ovens MD Beeper [phone] Office [phone] 11/12/2013 7:05 PM

## 2013-11-12 NOTE — Progress Notes (Signed)
Pt's son called to request that his stepsister (The pt's stepdaughter) is harassing the pt by stating, "you are going to lay there and die in a nursing home" and that she kept calling multiple times.  I spoke with pt and pt said that his stepdaughter is in no way harassing him and that she is very nice to him.  He said that his stepdaughter fights with his son and wife, but that his stepdaughter and himself have no problems between them.  He stated he would like stepdaughter to come visit and call as much as possible. He also stated he wanted his stepdaughter to have medical information about him if she called.  I informed the pt we will allow her to visit and get information on him, as long as he is agreeable to this.

## 2013-11-12 NOTE — Progress Notes (Addendum)
Clinical Social Work Department CLINICAL SOCIAL WORK PLACEMENT NOTE 11/12/2013  Patient:  [name], [name]  Account Number:  [id number] Admit date:  11/01/2013  Clinical Social Worker:  Sharol Harness, Theresia Majors  Date/time:  11/12/2013 11:45 AM  Clinical Social Work is seeking post-discharge placement for this patient at the following level of care:   SKILLED NURSING   (*CSW will update this form in Epic as items are completed)   11/12/2013  Patient/family provided with [facility] Department of Clinical Social Work's list of facilities offering this level of care within the geographic area requested by the patient (or if unable, by the patient's family).  11/12/2013  Patient/family informed of their freedom to choose among providers that offer the needed level of care, that participate in Medicare, Medicaid or managed care program needed by the patient, have an available bed and are willing to accept the patient.  11/12/2013  Patient/family informed of [facility]' ownership interest in [facility], as well as of the fact that they are under no obligation to receive care at this facility.  PASARR submitted to EDS on 11/12/2013 PASARR number received on 11/12/2013  FL2 transmitted to all facilities in geographic area requested by pt/family on  11/12/2013 FL2 transmitted to all facilities within larger geographic area on   Patient informed that his/her managed care company has contracts with or will negotiate with  certain facilities, including the following:     Patient/family informed of bed offers received:   ---- Patient chooses bed at [facility] Physician recommends and patient chooses bed at    Patient to be transferred to  on  ---- Patient to be transferred to facility by  ---- Patient and family notified of transfer on  ---- Name of family member notified:   ----  The following physician request were entered in Epic: Physician Request  Please sign FL2.    Additional  CommentsSharol Harness, LCSWA [phone]

## 2013-11-12 NOTE — Progress Notes (Signed)
ANTICOAGULATION CONSULT NOTE   Pharmacy Consult for Coumadin Indication: atrial fibrillation, left atrial thrombus  No Known Allergies  Patient Measurements: Height: 5\' 7"  (170.2 cm) Weight: 179 lb 14.3 oz (81.6 kg) IBW/kg (Calculated) : 66.1  Vital Signs: Temp: 97.9 F (36.6 C) (08/12 0439) Temp src: Oral (08/12 0439) BP: 142/55 mmHg (08/12 0439) Pulse Rate: 53 (08/12 0439)  Labs:  Recent Labs  11/10/13 0500 11/11/13 0350 11/12/13 0529  LABPROT 23.0* 32.5* 33.3*  INR 2.04* 3.17* 3.27*    Estimated Creatinine Clearance: 43.8 ml/min (by C-G formula based on Cr of 1.42).  Assessment: 80 year old male continues Coumadin for Afib and left atrial thrombus.  S/p pericardial window 8/7  Protime increased again today but upward trend is slowing  No bleeding noted  Goal of Therapy:  INR 2-3 Monitor platelets by anticoagulation protocol: Yes   Plan:  1) No coumadin today 2) Daily INR   Thank you. Talbert Cage, PharmD [phone] 11/12/2013,10:05 AM

## 2013-11-12 NOTE — Progress Notes (Signed)
[facility] PROGRESS NOTE  [name] WUJ:[id number] DOB: 1933-06-08 DOA: 11/01/2013 PCP: Dorrene German, MD  Assessment/Plan: 80 y.o. 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  Will get 2 view x ray and consider CT scan of abd---   Pericardial effusion -8/3: s/p pericardiocentesis 750 cc drained 08/03 (no organisms, mesothelial cells) -8/4-->8/5-->8/6: repeat echo: Moderatecircumferential effusion signs of tamponade. -8/7: s/p pericardial window: 500 cc of yellowish fluid removed; TEE, a left atrial appendage thrombus  -started on warfarin/AC; appreciate CT surgery, cardiology input    PAF likely underlying chronic afib with h/o CVA; now  left atrial appendage thrombus  -off Cardizem changed to PO amio; BB; on warfarin     Chest pain, resolved Patient was ruled out for acute ACS, denied any specific chest pain   Diabetes mellitus uncontrolled complicated with diabetic neuropathy, chronic left foot ulcer - Hemoglobin A1c 5.9, continue sliding scale insulin   Chronic diabetic left foot ulcer: Follows Dr. Lajoyce Corners  - Has Roland Rack boot dressing, weeping, hence placed on doxycycline, cont wound care  Needs to see Dr. Lajoyce Corners after d/c   Hypertension: improving  Diarrhea improved C. Difficile negative;     Scrotal edema with inguinal hernia  - Scrotal ultrasound showed bowel containing left inguinal hernia admitting bowel into the scrotal sac.  - Surgery was consulted and recommended outpatient elective repair  Thrombocytopenia; Lovenox was on hold; platelets improved cont SCD; started warfarin;'    CKD (chronic kidney disease) stage 3, GFR 30-59 ml/min; gentle hydration, baseline Cr 1.4-1.6, cr at baseline    Anemia: Appears to be  chronic, no precipitous drop, likely decrease from hemodilution   Constipation; already on bowel regimen; try enema; abd exam unremarkable;  -resolved   D/c plans : CIR?  DVT prophylaxis: SCD; warfarin   Code Status: full Family Communication:   Disposition Plan: CIR??   Consultants:  Cardiology  Procedures:  2-D echo  Pericardiocentesis Antibiotics:  Doxycycline   HPI/Subjective: Feeling well today  Objective: Filed Vitals:   11/12/13 1415  BP: 133/56  Pulse: 56  Temp: 97.2 F (36.2 C)  Resp: 18    Intake/Output Summary (Last 24 hours) at 11/12/13 1457 Last data filed at 11/12/13 1431  Gross per 24 hour  Intake    720 ml  Output    560 ml  Net    160 ml   Filed Weights   11/08/13 0540 11/09/13 0645 11/09/13 2006  Weight: 79.2 kg (174 lb 9.7 oz) 82 kg (180 lb 12.4 oz) 81.6 kg (179 lb 14.3 oz)    Exam:   General:  alert  Cardiovascular: s1,s2 irregular   Respiratory: CTA BL  Abdomen: soft, nt,nd   Musculoskeletal: L foot dressed   Data Reviewed: Basic Metabolic Panel:  Recent Labs Lab 11/07/13 1340 11/08/13 0430 11/09/13 0340  NA 139 138 135*  K 4.1 5.0 4.3  CL 103 105 104  CO2  --  20 20  GLUCOSE 129* 125* 125*  BUN 27* 27* 25*  CREATININE 1.40* 1.24 1.42*  CALCIUM  --  8.0* 7.6*  MG  --  1.9  --    Liver Function Tests:  Recent Labs Lab 11/09/13 0340  AST 9  ALT 7  ALKPHOS 80  BILITOT 0.8  PROT 5.1*  ALBUMIN 1.7*   No results found for this basename: LIPASE, AMYLASE,  in the last 168 hours No results found for this basename: AMMONIA,  in the last 168 hours CBC:  Recent Labs Lab 11/06/13 0612 11/07/13 1300 11/07/13 1340 11/08/13 0430 11/09/13 0340  WBC 5.8 4.7  --  5.2 5.7  HGB 9.0* 8.9* 9.2* 8.7* 8.7*  HCT 26.9* 26.7* 27.0* 26.6* 26.5*  MCV 84.9 83.4  --  84.4 85.8  PLT 157 183  --  189 183   Cardiac Enzymes: No results found for this basename: CKTOTAL, CKMB, CKMBINDEX, TROPONINI,  in the last 168  hours BNP (last 3 results) No results found for this basename: PROBNP,  in the last 8760 hours CBG:  Recent Labs Lab 11/11/13 1114 11/11/13 1703 11/11/13 2145 11/12/13 0609 11/12/13 1115  GLUCAP 126* 118* 123* 113* 125*    Recent Results (from the past 240 hour(s))  BODY FLUID CULTURE     Status: None   Collection Time    11/03/13 11:50 AM      Result Value Ref Range Status   Specimen Description FLUID PERICARDIAL   Final   Special Requests NONE   Final   Gram Stain     Final   Value: RARE WBC PRESENT, PREDOMINANTLY MONONUCLEAR     NO ORGANISMS SEEN     Performed at [facility]   Culture     Final   Value: NO GROWTH 3 DAYS     Performed at [facility]   Report Status 11/06/2013 FINAL   Final  BODY FLUID CULTURE     Status: None   Collection Time    11/07/13  2:55 PM      Result Value Ref Range Status   Specimen Description PERICARDIAL FLUID   Final   Special Requests PATIENT ON FOLLOWING DOXYCYCLIN   Final   Gram Stain     Final   Value: NO WBC SEEN     NO ORGANISMS SEEN     Performed at [facility]   Culture     Final   Value: NO GROWTH 3 DAYS     Performed at [facility]   Report Status 11/11/2013 FINAL   Final  AFB CULTURE WITH SMEAR     Status: None   Collection Time    11/07/13  2:55 PM      Result Value Ref Range Status   Specimen Description PERICARDIAL FLUID   Final   Special Requests PATIENT ON FOLLOWING DOXYCYCLIN   Final   Acid Fast Smear     Final   Value: NO ACID FAST BACILLI SEEN     Performed at [facility]   Culture     Final   Value: CULTURE WILL BE EXAMINED FOR 6 WEEKS BEFORE ISSUING A FINAL REPORT     Performed at [facility]   Report Status PENDING   Incomplete  FUNGUS CULTURE W SMEAR     Status: None   Collection Time    11/07/13  2:55 PM      Result Value Ref Range Status   Specimen Description PERICARDIAL FLUID   Final   Special Requests PATIENT ON FOLLOWING DOXYCYCLIN   Final    Fungal Smear     Final   Value: NO YEAST OR FUNGAL ELEMENTS SEEN     Performed at [facility]   Culture     Final  Value: CULTURE IN PROGRESS FOR FOUR WEEKS     [facility]   Report Status PENDING   Incomplete  ANAEROBIC CULTURE     Status: None   Collection Time    11/07/13  2:55 PM      Result Value Ref Range Status   Specimen Description PERICARDIAL FLUID   Final   Special Requests PATIENT ON FOLLOWING DOXYCYCLIN   Final   Gram Stain     Final   Value: NO WBC SEEN     NO ORGANISMS SEEN     [facility]   Culture     Final   Value: NO ANAEROBES ISOLATED     [facility]   Report Status 11/12/2013 FINAL   Final     Studies: Dg Chest 2 View  11/12/2013   CLINICAL DATA:  Weakness, status post pericardial drain removal  EXAM: CHEST  2 VIEW  COMPARISON:  11/09/2013  FINDINGS: Cardiac shadow is stable. A right-sided jugular line is again noted. A right pleural effusion is now noted. A small posterior left effusion is noted as well. Bibasilar atelectatic changes are noted on the lateral projection. The pericardial drain is been removed. Beneath the diaphragm, there is apparent free air identified. Further evaluation is recommended.  IMPRESSION: Bilateral pleural effusions which were not well appreciated on the prior exam.  New free intraperitoneal air suspicious for perforation. Further evaluation is recommended.  These results were called by telephone at the time of interpretation on 11/12/2013 at 10:30 am to Dr. Marlin CanaryJessica Melisse Caetano, who verbally acknowledged these results.   Electronically Signed   By: Alcide CleverMark  Lukens M.D.   On: 11/12/2013 10:55    Scheduled Meds: . acetaminophen  1,000 mg Oral 4 times per day   Or  . acetaminophen (TYLENOL) oral liquid 160 mg/5 mL  1,000 mg Oral 4 times per day  . amiodarone  400 mg Oral Daily  . amLODipine  5 mg Oral Daily  . aspirin EC  81 mg Oral Daily  . bisacodyl  10 mg Oral Daily   . doxycycline  100 mg Oral Q12H  . ferrous sulfate  325 mg Oral TID WC  . insulin aspart  0-5 Units Subcutaneous QHS  . insulin aspart  0-9 Units Subcutaneous TID WC  . metoprolol tartrate  12.5 mg Oral BID  . senna-docusate  1 tablet Oral BID  . simvastatin  10 mg Oral Q lunch  . Warfarin - Pharmacist Dosing Inpatient   Does not apply q1800   Continuous Infusions:    Principal Problem:   Chest pain Active Problems:   Diabetes mellitus without complication   Hypertension   CKD (chronic kidney disease) stage 3, GFR 30-59 ml/min   Anemia of chronic disease   PAD (peripheral artery disease)   Pericardial effusion   Ulcer of foot, chronic   Thrombocytopenia   Diarrhea   Scrotal edema   Atrial fibrillation with rapid ventricular response    Time spent: 35 minutes     [name]  [facility] Pager [phone]. If 7PM-7AM, please contact night-coverage at [email or web address], password [email or web address] 11/12/2013, 2:57 PM  LOS: 11 days

## 2013-11-12 NOTE — Progress Notes (Signed)
Rehab admissions - I met with pt in follow up to rehab MD consult and explained the possibility of inpatient rehab. Pt was very pleasant but hard of hearing. Questions were answered and informational brochures were given about our rehab program. Pt is interested in pursuing inpatient rehab. He asked that I call his son as his son is the primary contact for information and not his wife.  I then called and spoke with pt's son, Jamier Urbas. Elta Guadeloupe stated that he does prefer to be the primary contact as his mother has had a CVA and has some difficulty understanding medical details. Son is also interested in pursuing inpatient rehab for his dad. Son will relay information to his mother about this.  I explained that we will follow pt's case and seek MD clearance for a possible inpatient rehab tomorrow. I will check on pt's status in the am.   Please call me with any questions. Thanks.  Nanetta Batty, PT Rehabilitation Admissions Coordinator [phone]
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
## 2013-11-13 ENCOUNTER — Inpatient Hospital Stay (HOSPITAL_COMMUNITY)
Admission: RE | Admit: 2013-11-13 | Discharge: 2013-11-27 | DRG: 945 | Disposition: A | Discharge status: Skilled Nursing Facility | Payer: Medicare Other | Source: Intra-hospital | Attending: Physical Medicine & Rehabilitation | Admitting: Physical Medicine & Rehabilitation

## 2013-11-13 ENCOUNTER — Encounter (HOSPITAL_COMMUNITY): Payer: Self-pay | Admitting: *Deleted

## 2013-11-13 DIAGNOSIS — I509 Heart failure, unspecified: Secondary | ICD-10-CM | POA: Diagnosis present

## 2013-11-13 DIAGNOSIS — R5381 Other malaise: Secondary | ICD-10-CM | POA: Diagnosis not present

## 2013-11-13 DIAGNOSIS — Z5189 Encounter for other specified aftercare: Secondary | ICD-10-CM | POA: Diagnosis not present

## 2013-11-13 DIAGNOSIS — E1169 Type 2 diabetes mellitus with other specified complication: Secondary | ICD-10-CM | POA: Diagnosis present

## 2013-11-13 DIAGNOSIS — K4090 Unilateral inguinal hernia, without obstruction or gangrene, not specified as recurrent: Secondary | ICD-10-CM | POA: Diagnosis present

## 2013-11-13 DIAGNOSIS — I4891 Unspecified atrial fibrillation: Secondary | ICD-10-CM | POA: Diagnosis present

## 2013-11-13 DIAGNOSIS — L97509 Non-pressure chronic ulcer of other part of unspecified foot with unspecified severity: Secondary | ICD-10-CM | POA: Diagnosis present

## 2013-11-13 DIAGNOSIS — Z8673 Personal history of transient ischemic attack (TIA), and cerebral infarction without residual deficits: Secondary | ICD-10-CM | POA: Diagnosis not present

## 2013-11-13 DIAGNOSIS — Z7982 Long term (current) use of aspirin: Secondary | ICD-10-CM

## 2013-11-13 DIAGNOSIS — L97524 Non-pressure chronic ulcer of other part of left foot with necrosis of bone: Secondary | ICD-10-CM

## 2013-11-13 DIAGNOSIS — E1142 Type 2 diabetes mellitus with diabetic polyneuropathy: Secondary | ICD-10-CM | POA: Diagnosis present

## 2013-11-13 DIAGNOSIS — R931 Abnormal findings on diagnostic imaging of heart and coronary circulation: Secondary | ICD-10-CM

## 2013-11-13 DIAGNOSIS — N183 Chronic kidney disease, stage 3 unspecified: Secondary | ICD-10-CM | POA: Diagnosis present

## 2013-11-13 DIAGNOSIS — R350 Frequency of micturition: Secondary | ICD-10-CM | POA: Diagnosis not present

## 2013-11-13 DIAGNOSIS — R609 Edema, unspecified: Secondary | ICD-10-CM | POA: Diagnosis not present

## 2013-11-13 DIAGNOSIS — I313 Pericardial effusion (noninflammatory): Secondary | ICD-10-CM

## 2013-11-13 DIAGNOSIS — R9439 Abnormal result of other cardiovascular function study: Secondary | ICD-10-CM | POA: Diagnosis not present

## 2013-11-13 DIAGNOSIS — K5900 Constipation, unspecified: Secondary | ICD-10-CM | POA: Diagnosis present

## 2013-11-13 DIAGNOSIS — I5189 Other ill-defined heart diseases: Secondary | ICD-10-CM | POA: Diagnosis present

## 2013-11-13 DIAGNOSIS — Z794 Long term (current) use of insulin: Secondary | ICD-10-CM

## 2013-11-13 DIAGNOSIS — I129 Hypertensive chronic kidney disease with stage 1 through stage 4 chronic kidney disease, or unspecified chronic kidney disease: Secondary | ICD-10-CM | POA: Diagnosis present

## 2013-11-13 DIAGNOSIS — Z7901 Long term (current) use of anticoagulants: Secondary | ICD-10-CM

## 2013-11-13 DIAGNOSIS — I3139 Other pericardial effusion (noninflammatory): Secondary | ICD-10-CM

## 2013-11-13 DIAGNOSIS — E785 Hyperlipidemia, unspecified: Secondary | ICD-10-CM | POA: Diagnosis present

## 2013-11-13 DIAGNOSIS — N5089 Other specified disorders of the male genital organs: Secondary | ICD-10-CM | POA: Diagnosis present

## 2013-11-13 DIAGNOSIS — I498 Other specified cardiac arrhythmias: Secondary | ICD-10-CM | POA: Diagnosis present

## 2013-11-13 DIAGNOSIS — I319 Disease of pericardium, unspecified: Secondary | ICD-10-CM | POA: Diagnosis present

## 2013-11-13 DIAGNOSIS — E1149 Type 2 diabetes mellitus with other diabetic neurological complication: Secondary | ICD-10-CM | POA: Diagnosis present

## 2013-11-13 DIAGNOSIS — D638 Anemia in other chronic diseases classified elsewhere: Secondary | ICD-10-CM | POA: Diagnosis not present

## 2013-11-13 DIAGNOSIS — Z79899 Other long term (current) drug therapy: Secondary | ICD-10-CM | POA: Diagnosis not present

## 2013-11-13 DIAGNOSIS — R600 Localized edema: Secondary | ICD-10-CM

## 2013-11-13 DIAGNOSIS — Z8249 Family history of ischemic heart disease and other diseases of the circulatory system: Secondary | ICD-10-CM

## 2013-11-13 DIAGNOSIS — D62 Acute posthemorrhagic anemia: Secondary | ICD-10-CM | POA: Diagnosis present

## 2013-11-13 DIAGNOSIS — I150 Renovascular hypertension: Secondary | ICD-10-CM | POA: Diagnosis not present

## 2013-11-13 LAB — GLUCOSE, CAPILLARY
Glucose-Capillary: 112 mg/dL — ABNORMAL HIGH (ref 70–99)
Glucose-Capillary: 141 mg/dL — ABNORMAL HIGH (ref 70–99)
Glucose-Capillary: 127 mg/dL — ABNORMAL HIGH (ref 70–99)
Glucose-Capillary: 124 mg/dL — ABNORMAL HIGH (ref 70–99)

## 2013-11-13 LAB — PROTIME-INR
INR: 2.77 — ABNORMAL HIGH (ref 0.00–1.49)
Prothrombin Time: 29.3 seconds — ABNORMAL HIGH (ref 11.6–15.2)

## 2013-11-13 MED ORDER — SENNOSIDES-DOCUSATE SODIUM 8.6-50 MG PO TABS
1.0000 | ORAL_TABLET | Freq: Two times a day (BID) | ORAL | Status: DC
  Administered 2013-11-13 – 2013-11-22 (×14): 1 via ORAL
  Filled 2013-11-13 (×15): qty 1

## 2013-11-13 MED ORDER — INSULIN ASPART 100 UNIT/ML ~~LOC~~ SOLN: 0.0000 [IU] | Freq: Every day | SUBCUTANEOUS | Status: DC

## 2013-11-13 MED ORDER — DOXYCYCLINE HYCLATE 100 MG PO TABS
100.0000 mg | ORAL_TABLET | Freq: Two times a day (BID) | ORAL | Status: DC
  Administered 2013-11-13 – 2013-11-27 (×28): 100 mg via ORAL
  Filled 2013-11-13 (×30): qty 1

## 2013-11-13 MED ORDER — ONDANSETRON HCL 4 MG PO TABS
4.0000 mg | ORAL_TABLET | Freq: Four times a day (QID) | ORAL | Status: DC | PRN
  Administered 2013-11-22 – 2013-11-27 (×3): 4 mg via ORAL
  Filled 2013-11-13 (×4): qty 1

## 2013-11-13 MED ORDER — SIMVASTATIN 10 MG PO TABS
10.0000 mg | ORAL_TABLET | Freq: Every day | ORAL | Status: DC
  Administered 2013-11-14 – 2013-11-26 (×13): 10 mg via ORAL
  Filled 2013-11-13 (×14): qty 1

## 2013-11-13 MED ORDER — AMLODIPINE BESYLATE 5 MG PO TABS: 5.0000 mg | ORAL_TABLET | Freq: Every day | ORAL | Status: DC

## 2013-11-13 MED ORDER — OXYCODONE HCL 5 MG PO TABS
5.0000 mg | ORAL_TABLET | ORAL | Status: DC | PRN
  Administered 2013-11-22: 5 mg via ORAL
  Filled 2013-11-13 (×2): qty 1

## 2013-11-13 MED ORDER — ASPIRIN EC 81 MG PO TBEC
81.0000 mg | DELAYED_RELEASE_TABLET | Freq: Every day | ORAL | Status: DC
  Administered 2013-11-14 – 2013-11-27 (×14): 81 mg via ORAL
  Filled 2013-11-13 (×15): qty 1

## 2013-11-13 MED ORDER — INSULIN ASPART 100 UNIT/ML ~~LOC~~ SOLN: 0.0000 [IU] | Freq: Three times a day (TID) | SUBCUTANEOUS | Status: DC

## 2013-11-13 MED ORDER — ONDANSETRON HCL 4 MG/2ML IJ SOLN
4.0000 mg | Freq: Four times a day (QID) | INTRAMUSCULAR | Status: DC | PRN
  Administered 2013-11-19: 4 mg via INTRAVENOUS
  Filled 2013-11-13 (×2): qty 2

## 2013-11-13 MED ORDER — ACETAMINOPHEN 325 MG PO TABS
325.0000 mg | ORAL_TABLET | ORAL | Status: DC | PRN
  Administered 2013-11-18 – 2013-11-21 (×2): 650 mg via ORAL
  Filled 2013-11-13 (×3): qty 2

## 2013-11-13 MED ORDER — WARFARIN SODIUM 2.5 MG PO TABS
2.5000 mg | ORAL_TABLET | Freq: Once | ORAL | Status: AC
  Administered 2013-11-13: 2.5 mg via ORAL
  Filled 2013-11-13: qty 1

## 2013-11-13 MED ORDER — FERROUS SULFATE 325 (65 FE) MG PO TABS
325.0000 mg | ORAL_TABLET | Freq: Three times a day (TID) | ORAL | Status: DC
  Administered 2013-11-14 – 2013-11-27 (×36): 325 mg via ORAL
  Filled 2013-11-13 (×43): qty 1

## 2013-11-13 MED ORDER — AMLODIPINE BESYLATE 5 MG PO TABS
5.0000 mg | ORAL_TABLET | Freq: Every day | ORAL | Status: DC
  Administered 2013-11-14: 5 mg via ORAL
  Filled 2013-11-13 (×3): qty 1

## 2013-11-13 MED ORDER — DOXYCYCLINE HYCLATE 100 MG PO TABS: 100.0000 mg | ORAL_TABLET | Freq: Two times a day (BID) | ORAL | Status: DC

## 2013-11-13 MED ORDER — METOPROLOL TARTRATE 12.5 MG HALF TABLET
12.5000 mg | ORAL_TABLET | Freq: Two times a day (BID) | ORAL | Status: DC
  Administered 2013-11-14 – 2013-11-24 (×9): 12.5 mg via ORAL
  Filled 2013-11-13 (×24): qty 1

## 2013-11-13 MED ORDER — ONDANSETRON HCL 4 MG/2ML IJ SOLN: 4.0000 mg | Freq: Four times a day (QID) | INTRAMUSCULAR | Status: DC | PRN

## 2013-11-13 MED ORDER — AMIODARONE HCL 200 MG PO TABS
400.0000 mg | ORAL_TABLET | Freq: Every day | ORAL | Status: DC
  Administered 2013-11-14 – 2013-11-20 (×7): 400 mg via ORAL
  Filled 2013-11-13 (×8): qty 2

## 2013-11-13 MED ORDER — TRAMADOL HCL 50 MG PO TABS: 50.0000 mg | ORAL_TABLET | Freq: Four times a day (QID) | ORAL | Status: DC | PRN

## 2013-11-13 MED ORDER — OXYCODONE HCL 5 MG PO TABS: 5.0000 mg | ORAL_TABLET | ORAL | Status: DC | PRN

## 2013-11-13 MED ORDER — WARFARIN - PHARMACIST DOSING INPATIENT
Freq: Every day | Status: DC
Start: 2013-11-14 — End: 2013-11-27
  Administered 2013-11-23: 20:00:00

## 2013-11-13 MED ORDER — SORBITOL 70 % SOLN
30.0000 mL | Freq: Every day | Status: DC | PRN
  Administered 2013-11-14 – 2013-11-15 (×2): 30 mL via ORAL
  Filled 2013-11-13 (×2): qty 30

## 2013-11-13 MED ORDER — METOPROLOL TARTRATE 12.5 MG HALF TABLET: 12.5000 mg | ORAL_TABLET | Freq: Two times a day (BID) | ORAL | Status: DC

## 2013-11-13 MED ORDER — AMIODARONE HCL 400 MG PO TABS: 400.0000 mg | ORAL_TABLET | Freq: Every day | ORAL | Status: DC

## 2013-11-13 NOTE — Consult Note (Signed)
WOC follow-up: Please consult Dr Lajoyce Corners for patient's left leg wound.  Pt states he was due to follow up with ortho as an outpatient 2 weeks ago and has had a much longer than anticipated stay in the hospital; now he will be heading to inpatient rehab even longer.  Pt could also benefit from an X-ray to R/O osteomyelitis to left foot R/T persistent odor and drainage despite topical treatment.  Thank-you, Kairav Russomanno [place], CWOCN

## 2013-11-13 NOTE — PMR Pre-admission (Signed)
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 Date: eligible as of 11-13-13       Emergency Contact Information Contact Information   Name Relation Home Work Mobile   DelhiSizemore,Barry Son [phone]     [name],Barry Spouse [phone]       Current Medical History  Patient Admitting Diagnosis: Deconditioning multi-medical  History of Present Illness: [name] is a 80 y.o. right-handed male with history of previous CVA with little residual, hypertension, diabetes mellitus peripheral neuropathy with chronic nonhealing foot ulcers, chronic renal insufficiency as well as pericarditis associated with pericardial effusion which was identified during workup of acute CVA. Patient used a walker prior to admission. Wife also with history of CVA and she also uses a walker. Presented 11/02/2013 with chest tightness and palpitations. Echocardiogram with ejection fraction of 50% large pericardial effusion identified features consistent with a moderate tamponade as well as incidental findings of LAA thrombus. Underwent percutaneous pericardiocentesis 11/03/2013 per Dr. SwazilandJordan with removal proximally 750 mL of fluid. Followup echocardiogram showed resolution of pericardial effusion with echocardiogram again later completed showing some  reaccumulation of fluid.. Cardiothoracic surgery Dr. Tyrone SageGerhardt consulted and underwent subxiphoid pericardial window with drainage of pericardial effusion 11/09/2013.Marland Kitchen. Patient placed on Coumadin therapy for LAA thrombus as well as noted intermittent bouts of atrial fibrillation and monitored. Wound care nurse followup for chronic foot ulcers with skin care as directed. Physical and occupational therapy evaluations completed with recommendations of physical medicine rehabilitation consult.  Past Medical History  Past Medical History  Diagnosis Date  . Diabetes mellitus without complication   . Hypertension   . CKD (chronic kidney disease) stage 3, GFR 30-59 ml/min 08/01/2012  . Hypoglycemia 07/26/2012  . Anemia 08/01/2012  . CVA (cerebral vascular accident)   . Pericardial effusion     750 cc drained 11/03/2013  . Peripheral arterial disease     status post left SFA stenting by myself for critical limb ischemia 02/10/10    Family History  family history includes Hypertension in his father and mother.  Prior Rehab/Hospitalizations: had recent hospitalization on 7-4 following a fall ("they say I had a light stroke but I didn't. It was this fluid around my heart.") Pt had no follow up rehab.   Current Medications  Current facility-administered medications:amiodarone (PACERONE) tablet 400 mg, 400 mg, Oral, Daily, Chrystie NoseKenneth C. Hilty, MD, 400 mg at 11/11/13 1005;  amLODipine (NORVASC) tablet 5 mg, 5 mg, Oral, Daily, Esperanza SheetsUlugbek N Buriev, MD, 5 mg at 11/12/13 1025;  aspirin EC tablet 81 mg, 81 mg, Oral, Daily, Ulugbek N  York Spaniel, MD, 81 mg at 11/12/13 1025;  bisacodyl (DULCOLAX) EC tablet 10 mg, 10 mg, Oral, Daily, Donielle M Zimmerman, PA-C, 10 mg at 11/10/13 1003 doxycycline (VIBRA-TABS) tablet 100 mg, 100 mg, Oral, Q12H, Ripudeep K Rai, MD, 100 mg at 11/12/13 2225;  fentaNYL (SUBLIMAZE) injection 25 mcg, 25 mcg, Intravenous, Q2H PRN, Ardelle Balls, PA-C, 25 mcg at 11/13/13 4627;  ferrous sulfate tablet  325 mg, 325 mg, Oral, TID WC, Ron Parker, MD, 325 mg at 11/12/13 1025;  hydrALAZINE (APRESOLINE) injection 10 mg, 10 mg, Intravenous, Q6H PRN, Esperanza Sheets, MD, 10 mg at 11/09/13 0912 insulin aspart (novoLOG) injection 0-5 Units, 0-5 Units, Subcutaneous, QHS, Ripudeep K Rai, MD;  insulin aspart (novoLOG) injection 0-9 Units, 0-9 Units, Subcutaneous, TID WC, Ripudeep K Rai, MD, 1 Units at 11/11/13 1200;  metoprolol tartrate (LOPRESSOR) tablet 12.5 mg, 12.5 mg, Oral, BID, Esperanza Sheets, MD, 12.5 mg at 11/12/13 2225 ondansetron (ZOFRAN) injection 4 mg, 4 mg, Intravenous, Q6H PRN, Ardelle Balls, PA-C, 4 mg at 11/10/13 0350;  oxyCODONE (Oxy IR/ROXICODONE) immediate release tablet 5-10 mg, 5-10 mg, Oral, Q4H PRN, Ardelle Balls, PA-C, 5 mg at 11/09/13 0808;  potassium chloride 10 mEq in 50 mL *CENTRAL LINE* IVPB, 10 mEq, Intravenous, Daily PRN, Ardelle Balls, PA-C senna-docusate (Senokot-S) tablet 1 tablet, 1 tablet, Oral, BID, Esperanza Sheets, MD, 1 tablet at 11/12/13 2225;  simvastatin (ZOCOR) tablet 10 mg, 10 mg, Oral, Q lunch, Ron Parker, MD, 10 mg at 11/12/13 1025;  sodium chloride 0.9 % injection 10-40 mL, 10-40 mL, Intracatheter, PRN, Esperanza Sheets, MD, 10 mL at 11/13/13 0530;  traMADol (ULTRAM) tablet 50 mg, 50 mg, Oral, Q6H PRN, Ardelle Balls, PA-C, 50 mg at 11/09/13 0938 Warfarin - Pharmacist Dosing Inpatient, , Does not apply, q1800, Esperanza Sheets, MD  Patients Current Diet:  Heart healthy, carb modified  Precautions / Restrictions Precautions Precautions: Fall Precaution Comments: unna boot on LLE Restrictions Weight Bearing Restrictions: Yes LLE Weight Bearing: Weight bearing as tolerated Other Position/Activity Restrictions: Has boot that he wears at home   Note: pt is blind in right eye  Prior Activity Level Limited Community (1-2x/wk): pt got out on occasional errands with his wife (wife does the driving). He takes sponge baths  because he is afraid of falling. Pt had a fall around 7-4 and was hospitalized briefly then.  Home Assistive Devices / Equipment Home Assistive Devices/Equipment: Dan Humphreys (specify type);Cane (specify quad or straight);Eyeglasses Home Equipment: Walker - 2 wheels  Prior Functional Level Prior Function Level of Independence: Independent with assistive device(s);Needs assistance ADL's / Homemaking Assistance Needed: patient reports that his wife can and occasionally does assist with LB dressing.  Unclear if patient is good historian.  Current Functional Level Cognition  Overall Cognitive Status: Within Functional Limits for tasks assessed Orientation Level: Oriented X4    Extremity Assessment (includes Sensation/Coordination)          ADLs  Overall ADL's : Needs assistance/impaired Grooming: Wash/dry hands;Standing Upper Body Dressing : Total assistance;Sitting Upper Body Dressing Details (indicate cue type and reason): socks, right shoe and left walking boot with velcro Toilet Transfer: Moderate assistance;Stand-pivot;Ambulation;Regular Toilet;RW Toilet Transfer Details (indicate cue type and reason): required lift assistance off the commode.  PAtient reports that he does not have a grab bar at home and he uses his walker to stand up-was unable to stand up without assistance. Toileting- Architect and Hygiene: Min guard;Sit to/from stand    Mobility  Overal bed mobility: Needs Assistance Bed Mobility: Sit to Supine Supine to sit: Min assist Sit to supine: Min assist General bed mobility comments: Assist for LLE to get back into bed.     Transfers  Overall transfer level: Needs assistance Equipment used: Rolling walker (2 wheeled) Transfers: Sit to/from Stand Sit to Stand: Min assist General transfer comment: Pt requires mod cues for hand placement and safety to keep RW with him until all the way at seating surface. Also requires assist for increased forward weight  shift.     Ambulation / Gait / Stairs / Wheelchair Mobility  Ambulation/Gait Ambulation/Gait assistance: Hydrographic surveyor (Feet): 70 Feet (x 2 reps) Assistive device: Rolling walker (2 wheeled) Gait Pattern/deviations: Step-through pattern;Decreased stride length;Trunk flexed;Narrow base of support Gait velocity: decreased- 1 standing rest break General Gait Details: Pt continues to demonstrate forward flexed posture with balance deficits esp due to decreased sensation in R and LLE.  Does better with use of RW, however does require intermittent standing breaks due to fatigue.  Pts sats remained in 90's throughout on RA and HR to 76.     Posture / Balance      Special needs/care consideration BiPAP/CPAP no   CPM no  Continuous Drip IV no  Dialysis no          Life Vest no  Oxygen no  Special Bed no  Trach Size no  Wound Vac (area) no       Skin - current wound on L foot from existing diabetic sore, currently wrapped and followed by wound care. Forearms and hands with multiple bruises ("from that blood thinner" per pt)                               Bowel mgmt: last BM on 11-11-13 Bladder mgmt: currently using urinal Diabetic mgmt - yes, managed at home with insulin  Note: pt with R IJ line Note: pt is blind in R eye   Previous Home Environment Living Arrangements: Spouse/significant other;Children Available Help at Discharge: Available 24 hours/day;Family (children work days and wife with h/o CVA and uses a walker) Type of Home: House Home Layout: One level Home Access: Stairs to enter Entrance Stairs-Rails: Left Entrance Stairs-Number of Steps: 4 Bathroom Toilet: Standard Home Care Services: No Additional Comments: PTA: patient took sponge bathes. Recommend grab bar at toilet and unclear if patient has 3 in 1 commode  Discharge Living Setting Plans for Discharge Living Setting: Patient's home Type of Home at Discharge: House Discharge Home Layout: One  level Discharge Home Access: Stairs to enter Entrance Stairs-Rails: Right;Left Entrance Stairs-Number of Steps: 3 Does the patient have any problems obtaining your medications?: No  Social/Family/Support Systems Patient Roles: Spouse Contact Information: son Loraine Leriche is primary conact. Both pt/son state that pt's wife has had a CVA and has trouble with comprehension of medical issues at times. Anticipated Caregiver: wife and son (note that pt's goals are for Mod. Ind) Anticipated Caregiver's Contact Information: see above Ability/Limitations of Caregiver: wife has some cognitive issues following a CVA but is functional on a walker Caregiver Availability: Other (Comment) (son will also be checking on pt around his work schedule. Son lives in [place].) Discharge Plan Discussed with Primary Caregiver: Yes Is Caregiver In Agreement with Plan?: Yes Does Caregiver/Family have Issues with Lodging/Transportation while Pt is in Rehab?: No  Goals/Additional Needs Patient/Family Goal for Rehab: Mod indep with PT/OT, NA for  SLP Expected length of stay: 7 days Cultural Considerations: none Dietary Needs: heart healthy, carb modified Equipment Needs: to be determined Pt/Family Agrees to Admission and willing to participate: Yes (spoke with pt's son by phone on 8-12 and 8-13) Program Orientation Provided & Reviewed with Pt/Caregiver Including Roles  & Responsibilities: Yes   Decrease burden of Care through IP rehab admission: NA  Possible need for SNF placement upon discharge: not anticipated   Patient Condition: This patient's condition remains as documented in the consult dated 11-12-13, in which the Rehabilitation Physician determined and documented that the patient's condition is appropriate for intensive rehabilitative care in an inpatient rehabilitation facility. Will admit to inpatient rehab today.  Preadmission Screen Completed By:  Juliann Mule, PT 11/13/2013 10:25  AM ______________________________________________________________________   Discussed status with Dr. Wynn Banker on 11-13-13 at 1025 and received telephone approval for admission today.  Admission Coordinator:  Juliann Mule, PT time1025/Date 11/13/2013

## 2013-11-13 NOTE — Progress Notes (Signed)
Physical Therapy Treatment Patient Details Name: Barry LawmanRobert White MRN: [id number] DOB: 12/29/1933 Today's Date: 11/13/2013    History of Present Illness Presented to ED with c/o intermittent chest pain and palpations.  Found to have pericardial effusion.  Pericardiocentesis performed 11/03/13 and pericardial drain removed 11/09/13.    PT Comments    Pt making steady progress and eager for transfer to rehab.  Follow Up Recommendations  CIR     Equipment Recommendations  None recommended by PT    Recommendations for Other Services       Precautions / Restrictions Precautions Precautions: Fall Precaution Comments:  wears hard sole splint on lt foot when amb    Mobility  Bed Mobility Overal bed mobility: Needs Assistance Bed Mobility: Supine to Sit;Sit to Supine     Supine to sit: Mod assist Sit to supine: Min assist   General bed mobility comments: Assist to bring trunk up into sitting. Assist to bring LLE back up into bed on return to supine.  Transfers Overall transfer level: Needs assistance Equipment used: Rolling walker (2 wheeled) Transfers: Sit to/from Stand Sit to Stand: Min assist         General transfer comment: Verbal/tactile cues for hand placement. Assist to bring hips up and for balance.  Ambulation/Gait Ambulation/Gait assistance: Min assist Ambulation Distance (Feet): 150 Feet Assistive device: Rolling walker (2 wheeled) Gait Pattern/deviations: Step-through pattern;Decreased step length - right;Decreased step length - left;Trunk flexed Gait velocity: decr Gait velocity interpretation: Below normal speed for age/gender General Gait Details: Verbal cues to stand more upright and stay closer to the walker.   Stairs            Wheelchair Mobility    Modified Rankin (Stroke Patients Only)       Balance   Sitting-balance support: No upper extremity supported;Feet supported Sitting balance-Leahy Scale: Good       Standing balance-Leahy  Scale: Poor Standing balance comment: Support of walker for standing.                    Cognition Arousal/Alertness: Awake/alert Behavior During Therapy: WFL for tasks assessed/performed Overall Cognitive Status: Within Functional Limits for tasks assessed                      Exercises General Exercises - Upper Extremity Shoulder Flexion: AROM;Both;10 reps;Supine General Exercises - Lower Extremity Short Arc Quad: AROM;Both;10 reps;Supine Long Arc Quad: AROM;Both;10 reps;Seated Hip ABduction/ADduction: AAROM;Both;10 reps;Supine Hip Flexion/Marching: AAROM;Both;10 reps;Seated    General Comments        Pertinent Vitals/Pain Pain Assessment: No/denies pain    Home Living                      Prior Function            PT Goals (current goals can now be found in the care plan section) Progress towards PT goals: Progressing toward goals    Frequency  Min 3X/week    PT Plan Discharge plan needs to be updated    Co-evaluation             End of Session Equipment Utilized During Treatment: Gait belt Activity Tolerance: Patient tolerated treatment well Patient left: in bed;with call bell/phone within reach     Time: 1341-1404 PT Time Calculation (min): 23 min  Charges:  $Gait Training: 8-22 mins $Therapeutic Exercise: 8-22 mins  G Codes:      Anorah Trias 11/13/2013, 2:16 PM  [facility] PT [phone]

## 2013-11-13 NOTE — Progress Notes (Signed)
ANTICOAGULATION CONSULT NOTE - Follow up  Pharmacy Consult for Coumadin Indication: atrial fibrillation, left atrial thrombus  No Known Allergies  Patient Measurements: Height: 5\' 7"  (170.2 cm) Weight: 179 lb 14.3 oz (81.6 kg) IBW/kg (Calculated) : 66.1  Vital Signs: Temp: 98.8 F (37.1 C) (08/13 0846) Temp src: Oral (08/13 0846) BP: 134/64 mmHg (08/13 0846) Pulse Rate: 89 (08/13 0846)  Labs:  Recent Labs  11/11/13 0350 11/12/13 0529 11/13/13 0530  LABPROT 32.5* 33.3* 29.3*  INR 3.17* 3.27* 2.77*    Estimated Creatinine Clearance: 43.8 ml/min (by C-G formula based on Cr of 1.42).  Assessment: 80 year old male continues Coumadin for Afib and left atrial thrombus.  S/p pericardial window 8/7.  Last Coumadin dose was 8/9, then held x 3 days for elevated INR.  Noted possible drug interactions with Amiodarone and Doxycycline.  Goal of Therapy:  INR 2-3 Monitor platelets by anticoagulation protocol: Yes   Plan:  1) Coumadin 2.5mg  PO x 1 tonight. 2) Daily INR   [employer], Pharm.D., BCPS Clinical Pharmacist Pager [phone] 11/13/2013 11:26 AM
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
## 2013-11-13 NOTE — Progress Notes (Signed)
Rehab admissions - I followed up with Dr. Benjamine Mola, who gave medical clearance for inpatient rehab. Bed is available and will admit to inpatient rehab later today.  I completed admission paperwork with pt and called and shared update with pt's son. Both are pleased with the plan for inpatient rehab for later today.  I updated Raynelle Fanning, case Production designer, theatre/television/film and [place] with social work as well as Estate manager/land agent.  Thanks.  Juliann Mule, PT Rehabilitation Admissions Coordinator [phone]

## 2013-11-13 NOTE — Discharge Summary (Signed)
Physician Discharge Summary  Ahmed Vreeland TUU:[id number] DOB: 03-31-1934 DOA: 11/01/2013  PCP: Dorrene German, MD  Admit date: 11/01/2013 Discharge date: 11/13/2013  Time spent: 35 minutes  Recommendations for Outpatient Follow-up:  1. CIR 2. Warfarin per pharmacy 3. -monitor PT/INR 4. Monitor blood sugars 5. Follow with Dr. Lajoyce Corners for wound care to LLE  Discharge Diagnoses:  Principal Problem:   Chest pain Active Problems:   Diabetes mellitus without complication   Hypertension   CKD (chronic kidney disease) stage 3, GFR 30-59 ml/min   Anemia of chronic disease   PAD (peripheral artery disease)   Pericardial effusion   Ulcer of foot, chronic   Thrombocytopenia   Diarrhea   Scrotal edema   Atrial fibrillation with rapid ventricular response   Discharge Condition: improved  Diet recommendation: cardiac/diabetic  Filed Weights   11/08/13 0540 11/09/13 0645 11/09/13 2006  Weight: 79.2 kg (174 lb 9.7 oz) 82 kg (180 lb 12.4 oz) 81.6 kg (179 lb 14.3 oz)    History of present illness:  [name] is a 80 y.o. 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 He denies having any SOB, Nausea, Vomiting or Diaphoresis associated with the pain. He was evaluated in the ED by [facility] and referred for a cardiac rule-out. On his last admission to the hospital he was found to have a cardiac Tamponade, and tonight he was found to have no significant pericardial effusion by cardiology.    Hospital Course:  Chest x ray with free air-- patient is marginally tender at site of procedure. Free air likey related to air getting into abdomen with pericardial window  Pericardial effusion  -8/3: s/p pericardiocentesis 750 cc drained 08/03 (no organisms, mesothelial cells)  -8/4-->8/5-->8/6: repeat echo: Moderatecircumferential effusion signs of tamponade.   -8/7: s/p pericardial window: 500 cc of yellowish fluid removed; TEE, a left atrial appendage thrombus  -started on warfarin/AC; appreciate CT surgery, cardiology input   PAF likely underlying chronic afib with h/o CVA; now left atrial appendage thrombus  -off Cardizem changed to PO amio; BB; on warfarin   Chest pain, resolved Patient was ruled out for acute ACS, denied any specific chest pain   Diabetes mellitus uncontrolled complicated with diabetic neuropathy, chronic left foot ulcer  - Hemoglobin A1c 5.9, continue sliding scale insulin   Chronic diabetic left foot ulcer: Follows Dr. Lajoyce Corners  - Has Roland Rack boot dressing, weeping, hence placed on doxycycline, cont wound care  Needs to see Dr. Lajoyce Corners after d/c   Hypertension: improving  Diarrhea improved C. Difficile negative;   Scrotal edema with inguinal hernia  - Scrotal ultrasound showed bowel containing left inguinal hernia admitting bowel into the scrotal sac.  - Surgery was consulted and recommended outpatient elective repair  Thrombocytopenia; Lovenox was on hold; platelets improved cont SCD; started warfarin;'  CKD (chronic kidney disease) stage 3, GFR 30-59 ml/min; gentle hydration, baseline Cr 1.4-1.6, cr at baseline  Anemia: Appears to be chronic, no precipitous drop, likely decrease from hemodilution  Constipation; already on bowel regimen; try enema; abd exam unremarkable;  -resolved    Procedures:  pericardiocentesis  Consultations:  CV surgery- [facility]  Discharge Exam: Filed Vitals:   11/13/13 0846  BP: 134/64  Pulse: 89  Temp: 98.8 F (37.1 C)  Resp: 16    General: pleasant/cooperative, NAD- VERY hard of hearing Cardiovascular: rrr Respiratory: clear  Discharge Instructions You were cared for by a hospitalist during your hospital stay. If you have any questions about your discharge medications or the care you received while you were in the hospital after you are discharged, you can call the unit and  asked to speak with the hospitalist on call if the hospitalist that took care of you is not available. Once you are discharged, your primary care physician will handle any further medical issues. Please note that NO REFILLS for any discharge medications will be authorized once you are discharged, as it is imperative that you return to your primary care physician (or establish a relationship with a primary care physician if you do not have one) for your aftercare needs so that they can reassess your need for medications and monitor your lab values.     Medication List         amiodarone 400 MG tablet  Commonly known as:  PACERONE  Take 1 tablet (400 mg total) by mouth daily.     amLODipine 5 MG tablet  Commonly known as:  NORVASC  Take 1 tablet (5 mg total) by mouth daily.     aspirin 81 MG EC tablet  Take 1 tablet (81 mg total) by mouth daily.     BD INSULIN SYRINGE ULTRAFINE 31G X 5/16" 0.3 ML Misc  Generic drug:  Insulin Syringe-Needle U-100     doxycycline 100 MG tablet  Commonly known as:  VIBRA-TABS  Take 1 tablet (100 mg total) by mouth every 12 (twelve) hours.     ferrous sulfate 325 (65 FE) MG tablet  Take 1 tablet (325 mg total) by mouth 3 (three) times daily with meals.     glucose 4 GM chewable tablet  Chew 1 tablet by mouth as needed for low blood sugar.     insulin aspart 100 UNIT/ML injection  Commonly known as:  novoLOG  Inject 0-5 Units into the skin at bedtime.     insulin aspart 100 UNIT/ML injection  Commonly known as:  novoLOG  Inject 0-9 Units into the skin 3 (three) times daily with meals.     metoprolol tartrate 12.5 mg Tabs tablet  Commonly known as:  LOPRESSOR  Take 0.5 tablets (12.5 mg total) by mouth 2 (two) times daily.     ondansetron 4 MG/2ML Soln injection  Commonly known as:  ZOFRAN  Inject 2 mLs (4 mg total) into the vein every 6 (six) hours as needed for nausea or vomiting.     oxyCODONE 5 MG immediate release tablet  Commonly known  as:  Oxy IR/ROXICODONE  Take 1-2 tablets (5-10 mg total) by mouth every 4 (four) hours as needed for severe pain.     simvastatin 10 MG tablet  Commonly known as:  ZOCOR  Take 10 mg by mouth daily with lunch.     traMADol 50 MG tablet  Commonly known as:  ULTRAM  Take 1 tablet (50 mg total) by mouth every 6 (six) hours as needed for moderate pain (mild pain).       No Known Allergies Follow-up Information   Schedule an appointment as soon as possible for a visit with [facility]. (For new patient evaluation, call to set up an appointment)    Contact information:   [address] [place] [address] [phone]      Follow up with Wilburt Finlay, PA-C On 11/26/2013. (See for Dr. Allyson Sabal at 10:30 am)    Specialty:  Physician Assistant  Contact information:   [address] [place] [address] [phone]       Follow up with Delight Ovens, MD On 11/27/2013. (Appointment is at 10:00)    Specialty:  Cardiothoracic Surgery   Contact information:   [address] [place] [address] [place] [address] [phone]       Follow up with [facility] On 11/27/2013. (Please get CXR at 9:00, located on 1st floor of [facility])    Contact information:   [place]        The results of significant diagnostics from this hospitalization (including imaging, microbiology, ancillary and laboratory) are listed below for reference.    Significant Diagnostic Studies: Dg Chest 2 View  11/12/2013   CLINICAL DATA:  Weakness, status post pericardial drain removal  EXAM: CHEST  2 VIEW  COMPARISON:  11/09/2013  FINDINGS: Cardiac shadow is stable. A right-sided jugular line is again noted. A right pleural effusion is now noted. A small posterior left effusion is noted as well. Bibasilar atelectatic changes are noted on the lateral projection. The pericardial drain is been removed. Beneath the diaphragm, there is apparent free air identified.  Further evaluation is recommended.  IMPRESSION: Bilateral pleural effusions which were not well appreciated on the prior exam.  New free intraperitoneal air suspicious for perforation. Further evaluation is recommended.  These results were called by telephone at the time of interpretation on 11/12/2013 at 10:30 am to Dr. Marlin Canary, who verbally acknowledged these results.   Electronically Signed   By: Alcide Clever M.D.   On: 11/12/2013 10:55   Ct Head Wo Contrast  11/04/2013   CLINICAL DATA:  Mental status change  EXAM: CT HEAD WITHOUT CONTRAST  TECHNIQUE: Contiguous axial images were obtained from the base of the skull through the vertex without intravenous contrast.  COMPARISON:  CT 09/24/2013  FINDINGS: Advanced atrophy and advanced chronic microvascular ischemic changes. Small chronic infarct left occipital lobe  Negative for acute infarct. Negative for hemorrhage or mass. No change from the prior study.  IMPRESSION: Advanced atrophy and advanced chronic microvascular ischemia. No acute abnormality.   Electronically Signed   By: Marlan Palau M.D.   On: 11/04/2013 02:34   US Scrotum  11/02/2013   CLINICAL DATA:  Scrotal edema, swelling  EXAM: ULTRASOUND OF SCROTUM  TECHNIQUE: Complete ultrasound examination of the testicles, epididymis, and other scrotal structures was performed.  COMPARISON:  None.  FINDINGS: Right testicle  Measurements: 4.4 x 2.5 x 2.4 cm. No mass or microlithiasis visualized.  Left testicle  Measurements: 4.2 x 2.5 x 2.4 cm. No mass or microlithiasis visualized.  Right epididymis:  Normal in size and appearance.  Left epididymis:  Normal in size and appearance.  Hydrocele:  Small left hydrocele.  Varicocele:  No varicocele identified.  Loops of bowel were noted herniating into the left scrotal sac with peristalsis evident at real time imaging.  IMPRESSION: Bowel containing left inguinal hernia admitting bowel into the scrotal sac.  No intratesticular mass or other testicular  abnormality.   Electronically Signed   By: Christiana Pellant M.D.   On: 11/02/2013 18:28   Dg Chest Port 1 View  11/09/2013   CLINICAL DATA:  80 year old male -status post pericardial window for large pericardial effusions/tamponade.  EXAM: PORTABLE CHEST - 1 VIEW  COMPARISON:  11/07/2013 and prior chest radiographs  FINDINGS: This is a low volume film.  Bibasilar opacities probably represent atelectasis.  Decreased pulmonary vascular congestion noted.  A right IJ  central venous catheter is identified with tip overlying the mid -lower SVC  There is no evidence of pneumothorax.  Enlargement of the cardiopericardial silhouette is again noted but appears smaller since compared to 11/01/2013.  A pericardial drain is again noted.  IMPRESSION: Low volume film with increasing bibasilar opacities -suspect atelectasis.  No evidence of pneumothorax.   Electronically Signed   By: Laveda Abbe M.D.   On: 11/09/2013 08:22   Dg Chest Port 1 View  11/07/2013   CLINICAL DATA:  Status post pericardial window creation  EXAM: PORTABLE CHEST - 1 VIEW  COMPARISON:  November 01, 2013  FINDINGS: Central catheter tip is in the superior vena cava. No pneumothorax. There is no appreciable edema or consolidation. Heart is prominent but stable. The pulmonary vascularity is normal. No adenopathy.  IMPRESSION: Heart prominent but stable. No edema or consolidation. Central catheter tip in superior vena cava without pneumothorax.   Electronically Signed   By: Bretta Bang M.D.   On: 11/07/2013 15:57   Dg Chest Port 1 View  11/01/2013   CLINICAL DATA:  Chest pain and palpitations.  EXAM: PORTABLE CHEST - 1 VIEW  COMPARISON:  None.  FINDINGS: Shallow inspiration. Cardiac enlargement. Pulmonary vascularity is normal. No consolidation or edema. No blunting of costophrenic angles. No pneumothorax. Calcification of aorta. Degenerative changes in the shoulders.  IMPRESSION: Cardiac enlargement.  No evidence of active pulmonary disease.   Electronically  Signed   By: Burman Nieves M.D.   On: 11/01/2013 22:45   Dg Abd 2 Views  11/12/2013   CLINICAL DATA:  Free air suggested in the upper abdomen on the current chest radiograph.  EXAM: ABDOMEN - 2 VIEW  COMPARISON:  None.  FINDINGS: There are small areas of extraluminal air which project in the right upper abdomen non dependently on this left lateral decubitus view.  Normal bowel gas pattern.  There are vascular calcifications. Soft tissues are otherwise unremarkable.  IMPRESSION: Small amount of extraluminal intra abdominal air is seen on the left lateral decubitus view in the non dependent right upper abdomen. Origin of this air is unclear. The bowel gas pattern is normal.   Electronically Signed   By: Amie Portland M.D.   On: 11/12/2013 19:39    Microbiology: Recent Results (from the past 240 hour(s))  BODY FLUID CULTURE     Status: None   Collection Time    11/03/13 11:50 AM      Result Value Ref Range Status   Specimen Description FLUID PERICARDIAL   Final   Special Requests NONE   Final   Gram Stain     Final   Value: RARE WBC PRESENT, PREDOMINANTLY MONONUCLEAR     NO ORGANISMS SEEN     Performed at [facility]   Culture     Final   Value: NO GROWTH 3 DAYS     Performed at [facility]   Report Status 11/06/2013 FINAL   Final  BODY FLUID CULTURE     Status: None   Collection Time    11/07/13  2:55 PM      Result Value Ref Range Status   Specimen Description PERICARDIAL FLUID   Final   Special Requests PATIENT ON FOLLOWING DOXYCYCLIN   Final   Gram Stain     Final   Value: NO WBC SEEN     NO ORGANISMS SEEN     Performed at [facility]     Final  Value: NO GROWTH 3 DAYS     Performed at [facility]   Report Status 11/11/2013 FINAL   Final  AFB CULTURE WITH SMEAR     Status: None   Collection Time    11/07/13  2:55 PM      Result Value Ref Range Status   Specimen Description PERICARDIAL FLUID   Final   Special Requests PATIENT  ON FOLLOWING DOXYCYCLIN   Final   Acid Fast Smear     Final   Value: NO ACID FAST BACILLI SEEN     Performed at [facility]   Culture     Final   Value: CULTURE WILL BE EXAMINED FOR 6 WEEKS BEFORE ISSUING A FINAL REPORT     Performed at [facility]   Report Status PENDING   Incomplete  FUNGUS CULTURE W SMEAR     Status: None   Collection Time    11/07/13  2:55 PM      Result Value Ref Range Status   Specimen Description PERICARDIAL FLUID   Final   Special Requests PATIENT ON FOLLOWING DOXYCYCLIN   Final   Fungal Smear     Final   Value: NO YEAST OR FUNGAL ELEMENTS SEEN     Performed at [facility]   Culture     Final   Value: CULTURE IN PROGRESS FOR FOUR WEEKS     Performed at [facility]   Report Status PENDING   Incomplete  ANAEROBIC CULTURE     Status: None   Collection Time    11/07/13  2:55 PM      Result Value Ref Range Status   Specimen Description PERICARDIAL FLUID   Final   Special Requests PATIENT ON FOLLOWING DOXYCYCLIN   Final   Gram Stain     Final   Value: NO WBC SEEN     NO ORGANISMS SEEN     Performed at [facility]   Culture     Final   Value: NO ANAEROBES ISOLATED     Performed at [facility]   Report Status 11/12/2013 FINAL   Final     Labs: Basic Metabolic Panel:  Recent Labs Lab 11/07/13 1340 11/08/13 0430 11/09/13 0340  NA 139 138 135*  K 4.1 5.0 4.3  CL 103 105 104  CO2  --  20 20  GLUCOSE 129* 125* 125*  BUN 27* 27* 25*  CREATININE 1.40* 1.24 1.42*  CALCIUM  --  8.0* 7.6*  MG  --  1.9  --    Liver Function Tests:  Recent Labs Lab 11/09/13 0340  AST 9  ALT 7  ALKPHOS 80  BILITOT 0.8  PROT 5.1*  ALBUMIN 1.7*   No results found for this basename: LIPASE, AMYLASE,  in the last 168 hours No results found for this basename: AMMONIA,  in the last 168 hours CBC:  Recent Labs Lab 11/07/13 1300 11/07/13 1340 11/08/13 0430 11/09/13 0340  WBC 4.7  --  5.2 5.7  HGB 8.9* 9.2*  8.7* 8.7*  HCT 26.7* 27.0* 26.6* 26.5*  MCV 83.4  --  84.4 85.8  PLT 183  --  189 183   Cardiac Enzymes: No results found for this basename: CKTOTAL, CKMB, CKMBINDEX, TROPONINI,  in the last 168 hours BNP: BNP (last 3 results) No results found for this basename: PROBNP,  in the last 8760 hours CBG:  Recent Labs Lab 11/12/13 0609 11/12/13 1115 11/12/13 1648 11/12/13 2119 11/13/13  [id number]  GLUCAP 113* 125* 127* 130* 112*       Signed:  Tvisha Schwoerer  [facility] 11/13/2013, 9:16 AM

## 2013-11-13 NOTE — Progress Notes (Signed)
CSW (Clinical Child psychotherapist) notified by Tax inspector that they will be accepting pt. CSW signing off at this time.  Yehudit Fulginiti, LCSWA [phone]

## 2013-11-14 ENCOUNTER — Inpatient Hospital Stay (HOSPITAL_COMMUNITY): Payer: Medicare Other | Admitting: Physical Therapy

## 2013-11-14 ENCOUNTER — Inpatient Hospital Stay (HOSPITAL_COMMUNITY): Payer: Medicare Other | Admitting: Occupational Therapy

## 2013-11-14 LAB — CBC WITH DIFFERENTIAL/PLATELET
Basophils Absolute: 0 10*3/uL (ref 0.0–0.1)
Basophils Relative: 0 % (ref 0–1)
Eosinophils Absolute: 0.1 10*3/uL (ref 0.0–0.7)
Eosinophils Relative: 1 % (ref 0–5)
HEMATOCRIT: 28.4 % — AB (ref 39.0–52.0)
HEMOGLOBIN: 9.2 g/dL — AB (ref 13.0–17.0)
LYMPHS ABS: 1.3 10*3/uL (ref 0.7–4.0)
Lymphocytes Relative: 20 % (ref 12–46)
MCH: 28 pg (ref 26.0–34.0)
MCHC: 32.4 g/dL (ref 30.0–36.0)
MCV: 86.3 fL (ref 78.0–100.0)
MONO ABS: 0.4 10*3/uL (ref 0.1–1.0)
MONOS PCT: 6 % (ref 3–12)
NEUTROS ABS: 4.7 10*3/uL (ref 1.7–7.7)
NEUTROS PCT: 73 % (ref 43–77)
Platelets: 219 10*3/uL (ref 150–400)
RBC: 3.29 MIL/uL — AB (ref 4.22–5.81)
RDW: 16.3 % — ABNORMAL HIGH (ref 11.5–15.5)
WBC: 6.5 10*3/uL (ref 4.0–10.5)

## 2013-11-14 LAB — COMPREHENSIVE METABOLIC PANEL
ALBUMIN: 2.1 g/dL — AB (ref 3.5–5.2)
ALK PHOS: 84 U/L (ref 39–117)
ALT: 7 U/L (ref 0–53)
ANION GAP: 9 (ref 5–15)
AST: 9 U/L (ref 0–37)
BUN: 25 mg/dL — AB (ref 6–23)
CHLORIDE: 107 meq/L (ref 96–112)
CO2: 23 mEq/L (ref 19–32)
Calcium: 8.5 mg/dL (ref 8.4–10.5)
Creatinine, Ser: 1.78 mg/dL — ABNORMAL HIGH (ref 0.50–1.35)
GFR calc Af Amer: 40 mL/min — ABNORMAL LOW (ref >90)
GFR calc non Af Amer: 35 mL/min — ABNORMAL LOW (ref >90)
Glucose, Bld: 130 mg/dL — ABNORMAL HIGH (ref 70–99)
Potassium: 4.8 mEq/L (ref 3.7–5.3)
Sodium: 139 mEq/L (ref 137–147)
Total Bilirubin: 0.5 mg/dL (ref 0.3–1.2)
Total Protein: 5.4 g/dL — ABNORMAL LOW (ref 6.0–8.3)

## 2013-11-14 LAB — PROTIME-INR
INR: 2.56 — ABNORMAL HIGH (ref 0.00–1.49)
Prothrombin Time: 27.5 seconds — ABNORMAL HIGH (ref 11.6–15.2)

## 2013-11-14 LAB — GLUCOSE, CAPILLARY
GLUCOSE-CAPILLARY: 115 mg/dL — AB (ref 70–99)
GLUCOSE-CAPILLARY: 127 mg/dL — AB (ref 70–99)
Glucose-Capillary: 119 mg/dL — ABNORMAL HIGH (ref 70–99)
Glucose-Capillary: 142 mg/dL — ABNORMAL HIGH (ref 70–99)

## 2013-11-14 MED ORDER — GLUCERNA SHAKE PO LIQD
237.0000 mL | Freq: Two times a day (BID) | ORAL | Status: DC
  Administered 2013-11-14 – 2013-11-27 (×22): 237 mL via ORAL

## 2013-11-14 MED ORDER — WARFARIN SODIUM 2.5 MG PO TABS
2.5000 mg | ORAL_TABLET | Freq: Once | ORAL | Status: AC
  Administered 2013-11-14: 2.5 mg via ORAL
  Filled 2013-11-14: qty 1
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
## 2013-11-14 NOTE — Progress Notes (Signed)
Physical Therapy Session Note  Patient Details  Name: [name] MRN: [id number] Date of Birth: [birth year]  Today's Date: 11/14/2013 Time: 1530-1630 Time Calculation (min): 60 min  Short Term Goals: Week 1:     Skilled Therapeutic Interventions/Progress Updates:  Therapeutic Activity: PT instructs pt in rolling L in bed req CGA and verbal cues for hand/leg placement, L side lie to sit req mod A and verbal cues for hand placement, sit to stand with RW req min A and verbal cues for hand placement. PT instructs pt in toilet transfer with RW req CGA and verbal cues to use grab bar. PT instructs pt in sit to supine on bed, req mod A for B LE into bed. PT instructs pt in sit to stand multiple times throughout session req min A for balance and verbal cues for hand placement.   Gait Training: PT instructs pt in ambulation with RW x 50' + 60' req CGA for safety (min A for initial standing balance). PT instructs pt in ascending/descending 3 low stairs with B rails req CGA for safety. PT instructs pt in ascending/descending 2 stairs with B rails req min A for safety.   W/C Management: PT instructs pt in w/c propulsion with B UEs x 50' x 2 reps req verbal cues for steering and rest breaks due to fatigue. PT instructs pt in legrest management: donning, doffing, and flipping footplates up and pt continues to req manual assist.   Pt demonstrates low activity tolerance and dynamic standing balance impairment. Pt desaturates to 89% on room air with activity, but recovers within a minute to >= 90%. Pt will benefit from continued PT to address above impairment and work on gait endurance and stair training safely.    Therapy Documentation Precautions:  Precautions Precautions: Fall Precaution Comments: wears Unna boot when ambulating Restrictions Weight Bearing Restrictions: Yes LLE Weight Bearing: Weight bearing as tolerated    Pain: Pain Assessment Pain Assessment: 0-10 Pain Score: 5  Pain  Type: Other (Comment) (constipated) Pain Location: Abdomen Pain Descriptors / Indicators: Aching Pain Onset: Gradual Pain Intervention(s): Other (Comment) (assisted to toilet to have a BM)  See FIM for current functional status  Therapy/Group: Individual Therapy  Ranisha Allaire M 11/14/2013, 3:44 PM

## 2013-11-14 NOTE — Consult Note (Signed)
  Patient is a 80 year old gentleman well known to me with chronic ulcerations for the left lower extremity. Patient has been managed as an outpatient with compressive wraps with the Unna compressive wrap. Will have the Unna wrap changed weekly in the hospital and will have a PRAFO applied to unload the pressure from his heel. I will followup as an outpatient.

## 2013-11-14 NOTE — Progress Notes (Signed)
80 y.o. right-handed male with history of previous CVA with little residual, hypertension, diabetes mellitus peripheral neuropathy with chronic nonhealing foot ulcers, chronic renal insufficiency with baseline creatinine 1.56 as well as pericarditis associated with pericardial effusion which was identified during workup of acute CVA. Patient used a walker prior to admission. Wife also with history of CVA and she also uses a walker. Presented 11/02/2013 with chest tightness and palpitations. Echocardiogram with ejection fraction of 50% large pericardial effusion identified features consistent with a moderate tamponade as well as incidental findings of LAA thrombus. Underwent percutaneous pericardiocentesis 11/03/2013 per Dr. [place] with removal proximally 750 mL of fluid. Followup echocardiogram showed resolution of pericardial effusion with echocardiogram again later completed showing some reaccumulation of fluid  Subjective/Complaints:   Objective: Vital Signs: Blood pressure 152/64, pulse 64, temperature 98.1 F (36.7 C), temperature source Oral, resp. rate 18, height _0  (1.702 m), weight 84.8 kg (186 lb 15.2 oz), SpO2 96.00%. Dg Chest 2 View  11/12/2013   CLINICAL DATA:  Weakness, status post pericardial drain removal  EXAM: CHEST  2 VIEW  COMPARISON:  11/09/2013  FINDINGS: Cardiac shadow is stable. A right-sided jugular line is again noted. A right pleural effusion is now noted. A small posterior left effusion is noted as well. Bibasilar atelectatic changes are noted on the lateral projection. The pericardial drain is been removed. Beneath the diaphragm, there is apparent free air identified. Further evaluation is recommended.  IMPRESSION: Bilateral pleural effusions which were not well appreciated on the prior exam.  New free intraperitoneal air suspicious for perforation. Further evaluation is recommended.  These results were called by telephone at the time of interpretation on 11/12/2013 at 10:30  am to Dr. Eulogio Bear, who verbally acknowledged these results.   Electronically Signed   By: Inez Catalina M.D.   On: 11/12/2013 10:55   Dg Abd 2 Views  11/12/2013   CLINICAL DATA:  Free air suggested in the upper abdomen on the current chest radiograph.  EXAM: ABDOMEN - 2 VIEW  COMPARISON:  None.  FINDINGS: There are small areas of extraluminal air which project in the right upper abdomen non dependently on this left lateral decubitus view.  Normal bowel gas pattern.  There are vascular calcifications. Soft tissues are otherwise unremarkable.  IMPRESSION: Small amount of extraluminal intra abdominal air is seen on the left lateral decubitus view in the non dependent right upper abdomen. Origin of this air is unclear. The bowel gas pattern is normal.   Electronically Signed   By: Lajean Manes M.D.   On: 11/12/2013 19:39   Results for orders placed during the hospital encounter of 11/13/13 (from the past 72 hour(s))  GLUCOSE, CAPILLARY     Status: Abnormal   Collection Time    11/13/13  9:24 PM      Result Value Ref Range   Glucose-Capillary 124 (*) 70 - 99 mg/dL  CBC WITH DIFFERENTIAL     Status: Abnormal   Collection Time    11/14/13  4:36 AM      Result Value Ref Range   WBC 6.5  4.0 - 10.5 K/uL   RBC 3.29 (*) 4.22 - 5.81 MIL/uL   Hemoglobin 9.2 (*) 13.0 - 17.0 g/dL   HCT 28.4 (*) 39.0 - 52.0 %   MCV 86.3  78.0 - 100.0 fL   MCH 28.0  26.0 - 34.0 pg   MCHC 32.4  30.0 - 36.0 g/dL   RDW 16.3 (*) 11.5 - 15.5 %  Platelets 219  150 - 400 K/uL   Neutrophils Relative % 73  43 - 77 %   Neutro Abs 4.7  1.7 - 7.7 K/uL   Lymphocytes Relative 20  12 - 46 %   Lymphs Abs 1.3  0.7 - 4.0 K/uL   Monocytes Relative 6  3 - 12 %   Monocytes Absolute 0.4  0.1 - 1.0 K/uL   Eosinophils Relative 1  0 - 5 %   Eosinophils Absolute 0.1  0.0 - 0.7 K/uL   Basophils Relative 0  0 - 1 %   Basophils Absolute 0.0  0.0 - 0.1 K/uL  COMPREHENSIVE METABOLIC PANEL     Status: Abnormal   Collection Time    11/14/13   4:36 AM      Result Value Ref Range   Sodium 139  137 - 147 mEq/L   Potassium 4.8  3.7 - 5.3 mEq/L   Chloride 107  96 - 112 mEq/L   CO2 23  19 - 32 mEq/L   Glucose, Bld 130 (*) 70 - 99 mg/dL   BUN 25 (*) 6 - 23 mg/dL   Creatinine, Ser 1.78 (*) 0.50 - 1.35 mg/dL   Calcium 8.5  8.4 - 10.5 mg/dL   Total Protein 5.4 (*) 6.0 - 8.3 g/dL   Albumin 2.1 (*) 3.5 - 5.2 g/dL   AST 9  0 - 37 U/L   ALT 7  0 - 53 U/L   Alkaline Phosphatase 84  39 - 117 U/L   Total Bilirubin 0.5  0.3 - 1.2 mg/dL   GFR calc non Af Amer 35 (*) >90 mL/min   GFR calc Af Amer 40 (*) >90 mL/min   Comment: (NOTE)     The eGFR has been calculated using the CKD EPI equation.     This calculation has not been validated in all clinical situations.     eGFR's persistently <90 mL/min signify possible Chronic Kidney     Disease.   Anion gap 9  5 - 15  PROTIME-INR     Status: Abnormal   Collection Time    11/14/13  4:36 AM      Result Value Ref Range   Prothrombin Time 27.5 (*) 11.6 - 15.2 seconds   INR 2.56 (*) 0.00 - 1.49  GLUCOSE, CAPILLARY     Status: Abnormal   Collection Time    11/14/13  7:22 AM      Result Value Ref Range   Glucose-Capillary 115 (*) 70 - 99 mg/dL   Comment 1 Notify RN       HEENT: normal Cardio: RRR Resp: CTA B/L GI: BS positive and NT, ND Extremity:  Edema R pre tib, LLE in UNNA boot Skin:   Intact and Wound UNNA boot , new no soak through, + foul odor Neuro: Alert/Oriented and Abnormal Motor 3-/5 Left HF, KE, Ankle NT secondary to UNNA, 4/5 BUE andRLE Musc/Skel:  Other No calf tenderness GEN NAD   Assessment/Plan: 1. Functional deficits secondary to deconditioning secondary to pericardial effusion which require 3+ hours per day of interdisciplinary therapy in a comprehensive inpatient rehab setting. Physiatrist is providing close team supervision and 24 hour management of active medical problems listed below. Physiatrist and rehab team continue to assess barriers to discharge/monitor  patient progress toward functional and medical goals. FIM:  Medical Problem List and Plan:   1. Functional deficits secondary to deconditioning after pericardial effusion status post subxiphoid pericardial window 11/09/2013   2. DVT Prophylaxis/Anticoagulation: Initiation of Coumadin for LAAthrombus as well intermittent atrial fibrillation. Monitor for any bleeding episodes   3. Pain Management: Oxycodone as needed. Monitor with increased mobility   4. acute blood loss anemia. Continue iron supplement. Followup CBC   5. Neuropsych: This patient is capable of making decisions on his own behalf.   6. Skin/Wound Care/chronic diabetic left foot ulcer.: Followup wound care nurse. Continue Haematologist. Doxycycline for wound coverage   7. Diabetes mellitus with peripheral neuropathy. Latest hemoglobin A1c 5.9. Check blood sugars a.c. and at bedtime. Continue sliding scale insulin   8. Hypertension/atrial fibrillation. Lopressor 12.5 mg twice a day, Norvasc 5 mg daily, amiodarone 400 mg daily. Cardiac rate control. Monitor with increased mobility   9. Chronic renal insufficiency. Baseline creatinine 1.56. Followup chemistries   10. Hyperlipidemia. Zocor     LOS (Days) 1 A FACE TO FACE EVALUATION WAS PERFORMED  KIRSTEINS,ANDREW E 11/14/2013, 8:57 AM

## 2013-11-14 NOTE — IPOC Note (Signed)
Overall Plan of Care [facility]) Patient Details Name: [name] MRN: [id number] DOB: [birth year]  Admitting Diagnosis: deconditioning sp pericardial effusion no SLP  Hospital Problems: Active Problems:   Physical deconditioning     Functional Problem List: Nursing    PT Balance;Pain;Perception;Edema;Safety;Endurance;Sensory;Motor;Skin Integrity  OT Balance;Cognition;Endurance;Motor;Safety;Sensory;Vision  SLP    TR         Basic ADL's: OT Grooming;Bathing;Dressing;Toileting     Advanced  ADL's: OT Simple Meal Preparation     Transfers: PT Bed Mobility;Bed to Chair;Car;Furniture  OT Toilet;Tub/Shower     Locomotion: PT Ambulation;Wheelchair Mobility;Stairs     Additional Impairments: OT None  SLP        TR      Anticipated Outcomes Item Anticipated Outcome  Self Feeding independent  Swallowing      Basic self-care  supervision, min assist LB  Toileting  supervision   Bathroom Transfers supervision  Bowel/Bladder     Transfers  Supervision with RW  Locomotion  Supervision with RW  Communication     Cognition     Pain     Safety/Judgment      Therapy Plan: PT Intensity: Minimum of 1-2 x/day ,45 to 90 minutes PT Frequency: 5 out of 7 days PT Duration Estimated Length of Stay: 7 to 10 days OT Intensity: Minimum of 1-2 x/day, 45 to 90 minutes OT Frequency: 5 out of 7 days OT Duration/Estimated Length of Stay: 7-10 days         Team Interventions: Nursing Interventions    PT interventions Ambulation/gait training;Cognitive remediation/compensation;Discharge planning;DME/adaptive equipment instruction;Functional mobility training;Pain management;Psychosocial support;Therapeutic Activities;UE/LE Strength taining/ROM;Visual/perceptual remediation/compensation;Balance/vestibular training;Disease management/prevention;Neuromuscular re-education;Patient/family education;Skin care/wound management;Stair training;Therapeutic Exercise;UE/LE Coordination  activities;Wheelchair propulsion/positioning  OT Interventions Balance/vestibular training;Cognitive remediation/compensation;Community reintegration;Discharge planning;DME/adaptive equipment instruction;Functional mobility training;Pain management;Patient/family education;Psychosocial support;Self Care/advanced ADL retraining;Skin care/wound managment;Therapeutic Activities;Therapeutic Exercise;UE/LE Strength taining/ROM;UE/LE Coordination activities;Visual/perceptual remediation/compensation  SLP Interventions    TR Interventions    SW/CM Interventions      Team Discharge Planning: Destination: PT-Home ,OT- Home , SLP-  Projected Follow-up: PT-Home health PT;24 hour supervision/assistance, OT-  Home health OT;24 hour supervision/assistance, SLP-  Projected Equipment Needs: PT-Rolling walker with 5" wheels;To be determined;Wheelchair (measurements);Wheelchair cushion (measurements), OT- 3 in 1 bedside comode, SLP-  Equipment Details: PT- , OT-  Patient/family involved in discharge planning: PT- Patient,  OT-Patient, SLP-   MD ELOS: 7d Medical Rehab Prognosis:  Excellent Assessment: 80 y.o. right-handed male with history of previous CVA with little residual, hypertension, diabetes mellitus peripheral neuropathy with chronic nonhealing foot ulcers, chronic renal insufficiency with baseline creatinine 1.56 as well as pericarditis associated with pericardial effusion which was identified during workup of acute CVA. Patient used a walker prior to admission. Wife also with history of CVA and she also uses a walker. Presented 11/02/2013 with chest tightness and palpitations. Echocardiogram with ejection fraction of 50% large pericardial effusion identified features consistent with a moderate tamponade as well as incidental findings of LAA thrombus. Underwent percutaneous pericardiocentesis 11/03/2013 per Dr. [place] with removal proximally 750 mL of fluid. Followup echocardiogram showed resolution of  pericardial effusion with echocardiogram again later completed showing some reaccumulation of fluid.. Cardiothoracic surgery Dr. Tyrone Sage consulted and underwent subxiphoid pericardial window with drainage of pericardial effusion 11/09/2013  Now requiring 24/7 Rehab RN,MD, as well as CIR level PT, OT .  Treatment team will focus on ADLs and mobility with goals set at Supervision   See Team Conference Notes for weekly updates to the plan of care

## 2013-11-14 NOTE — Consult Note (Addendum)
WOC follow-up: Refer to previous progress notes for wound assessments and measurements.  Pt transferred to rehab yesterday and drainage to left leg has saturated current Profore wraps, which were changed on 8/11.  Discussed plan of care with primary team.  Recommend follow-up assessment by Dr Lajoyce Corners who was following patient prior to admission.  Wounds are unchanged in appearance but continue to have large amt of dark brown drainage with strong foul odor, despite changing Aquacel and Profore wraps 2X week while in the hospital.  Pt could benefit from X-ray to R/O osteomyelitis.  Applied Aquacel to absorb drainage and provide antimicrobial benefits, then 4 layer Profore compression wraps as previously ordered.  If Dr Lajoyce Corners arrives for consultation and assesses LLE later on Fri afternoon or during the weekend, WOC nurses are not available during that time frame to re-apply Profore wraps and Una boot could be applied by the ortho tech, or a  dressing with ace wrap could be applied and changed daily by bedside nurses.   Thank-you, Cammie Mcgee MSN, RN, CWOCN, [place], CNS [phone]

## 2013-11-14 NOTE — Progress Notes (Signed)
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 Date: 08-01-00 Deduct: $1260 Out of Pocket Max: none Life Max: unlimited  CIR: 100% SNF: 100% days 1-20; 80% days 21-100 (100 day visit max)  Outpatient: 80% Co-Pay: 20%  Home Health: 100% Co-Pay: 20%  DME: 80% Co-Pay: 20%  Providers: pt's preference   SECONDARY: Medicaid of  Policy#: [id number] l Subscriber: self  Eff. Date: eligible as of 11-13-13  Emergency Contact Information    Contact Information     Name  Relation  Home  Work  Mobile     [place]  Son  [phone]       Travonn, Alexis  [phone]          Current Medical History  Patient Admitting Diagnosis: Deconditioning multi-medical  History of Present Illness: [name] is a 80 y.o. right-handed male with history of previous CVA with little residual, hypertension, diabetes mellitus peripheral neuropathy with chronic nonhealing foot ulcers, chronic renal insufficiency as well as pericarditis associated with pericardial effusion which was identified during workup of acute CVA. Patient used a walker prior to admission. Wife also with history of CVA and she also uses a walker. Presented 11/02/2013 with chest tightness and palpitations. Echocardiogram with ejection fraction of 50% large pericardial effusion identified features consistent with a moderate tamponade as well as incidental findings of LAA thrombus. Underwent percutaneous  pericardiocentesis 11/03/2013 per Dr. [place] with removal proximally 750 mL of fluid. Followup echocardiogram showed resolution of pericardial effusion with echocardiogram again later completed showing some reaccumulation of fluid.. Cardiothoracic surgery Dr. Tyrone Sage consulted and underwent subxiphoid pericardial window with drainage of pericardial effusion 11/09/2013.Marland Kitchen Patient placed on Coumadin therapy for LAA thrombus as well as noted intermittent bouts of atrial fibrillation and monitored. Wound care nurse followup for chronic foot ulcers with skin care as directed. Physical and occupational therapy evaluations completed with recommendations of physical medicine rehabilitation consult.  Past Medical History    Past Medical History    Diagnosis  Date    .  Diabetes mellitus without complication     .  Hypertension     .  CKD (chronic kidney disease) stage 3, GFR 30-59 ml/min  08/01/2012    .  Hypoglycemia  07/26/2012    .  Anemia  08/01/2012    .  CVA (cerebral vascular accident)     .  Pericardial effusion       750 cc drained 11/03/2013    .  Peripheral arterial disease       status post left SFA stenting by myself for critical limb ischemia 02/10/10     Family History  family history includes Hypertension in his father and mother.  Prior Rehab/Hospitalizations: had recent hospitalization on 7-4 following a fall ("they say I had a light stroke but I didn't. It was this fluid around my heart.") Pt had no follow up rehab.  Current Medications  Current facility-administered medications:amiodarone (PACERONE) tablet 400 mg, 400 mg, Oral, Daily, Chrystie Nose, MD, 400 mg at 11/11/13 1005; amLODipine (  NORVASC) tablet 5 mg, 5 mg, Oral, Daily, Esperanza SheetsUlugbek N Buriev, MD, 5 mg at 11/12/13 1025; aspirin EC tablet 81 mg, 81 mg, Oral, Daily, Esperanza SheetsUlugbek N Buriev, MD, 81 mg at 11/12/13 1025; bisacodyl (DULCOLAX) EC tablet 10 mg, 10 mg, Oral, Daily, Donielle M Zimmerman, PA-C, 10 mg at 11/10/13 1003  doxycycline  (VIBRA-TABS) tablet 100 mg, 100 mg, Oral, Q12H, Ripudeep K Rai, MD, 100 mg at 11/12/13 2225; fentaNYL (SUBLIMAZE) injection 25 mcg, 25 mcg, Intravenous, Q2H PRN, Ardelle Ballsonielle M Zimmerman, PA-C, 25 mcg at 11/13/13 [id number]; ferrous sulfate tablet 325 mg, 325 mg, Oral, TID WC, Ron ParkerHarvette C Jenkins, MD, 325 mg at 11/12/13 1025; hydrALAZINE (APRESOLINE) injection 10 mg, 10 mg, Intravenous, Q6H PRN, Esperanza SheetsUlugbek N Buriev, MD, 10 mg at 11/09/13 0912  insulin aspart (novoLOG) injection 0-5 Units, 0-5 Units, Subcutaneous, QHS, Ripudeep K Rai, MD; insulin aspart (novoLOG) injection 0-9 Units, 0-9 Units, Subcutaneous, TID WC, Ripudeep K Rai, MD, 1 Units at 11/11/13 1200; metoprolol tartrate (LOPRESSOR) tablet 12.5 mg, 12.5 mg, Oral, BID, Esperanza SheetsUlugbek N Buriev, MD, 12.5 mg at 11/12/13 2225  ondansetron (ZOFRAN) injection 4 mg, 4 mg, Intravenous, Q6H PRN, Ardelle Ballsonielle M Zimmerman, PA-C, 4 mg at 11/10/13 [id number]; oxyCODONE (Oxy IR/ROXICODONE) immediate release tablet 5-10 mg, 5-10 mg, Oral, Q4H PRN, Ardelle Ballsonielle M Zimmerman, PA-C, 5 mg at 11/09/13 0808; potassium chloride 10 mEq in 50 mL *CENTRAL LINE* IVPB, 10 mEq, Intravenous, Daily PRN, Ardelle Ballsonielle M Zimmerman, PA-C  senna-docusate (Senokot-S) tablet 1 tablet, 1 tablet, Oral, BID, Esperanza SheetsUlugbek N Buriev, MD, 1 tablet at 11/12/13 2225; simvastatin (ZOCOR) tablet 10 mg, 10 mg, Oral, Q lunch, Ron ParkerHarvette C Jenkins, MD, 10 mg at 11/12/13 1025; sodium chloride 0.9 % injection 10-40 mL, 10-40 mL, Intracatheter, PRN, Esperanza SheetsUlugbek N Buriev, MD, 10 mL at 11/13/13 0530; traMADol (ULTRAM) tablet 50 mg, 50 mg, Oral, Q6H PRN, Ardelle Ballsonielle M Zimmerman, PA-C, 50 mg at 11/09/13 [id number]  Warfarin - Pharmacist Dosing Inpatient, , Does not apply, q1800, Esperanza SheetsUlugbek N Buriev, MD  Patients Current Diet: Heart healthy, carb modified  Precautions / Restrictions  Precautions  Precautions: Fall  Precaution Comments: unna boot on LLE  Restrictions  Weight Bearing Restrictions: Yes  LLE Weight Bearing: Weight bearing as tolerated  Other  Position/Activity Restrictions: Has boot that he wears at home  Note: pt is blind in right eye  Prior Activity Level  Limited Community (1-2x/wk): pt got out on occasional errands with his wife (wife does the driving). He takes sponge baths because he is afraid of falling. Pt had a fall around 7-4 and was hospitalized briefly then.  Home Assistive Devices / Equipment  Home Assistive Devices/Equipment: Dan HumphreysWalker (specify type);Cane (specify quad or straight);Eyeglasses  Home Equipment: Walker - 2 wheels  Prior Functional Level  Prior Function  Level of Independence: Independent with assistive device(s);Needs assistance  ADL's / Homemaking Assistance Needed: patient reports that his wife can and occasionally does assist with LB dressing. Unclear if patient is good historian.  Current Functional Level    Cognition  Overall Cognitive Status: Within Functional Limits for tasks assessed  Orientation Level: Oriented X4    Extremity Assessment  (includes Sensation/Coordination)      ADLs  Overall ADL's : Needs assistance/impaired  Grooming: Wash/dry hands;Standing  Upper Body Dressing : Total assistance;Sitting  Upper Body Dressing Details (indicate cue type and reason): socks, right shoe and left walking boot with velcro  Toilet Transfer: Moderate assistance;Stand-pivot;Ambulation;Regular Toilet;RW  Toilet Transfer Details (indicate cue type and reason): required lift assistance off the commode. PAtient reports  that he does not have a grab bar at home and he uses his walker to stand up-was unable to stand up without assistance.  Toileting- Architect and Hygiene: Min guard;Sit to/from stand    Mobility  Overal bed mobility: Needs Assistance  Bed Mobility: Sit to Supine  Supine to sit: Min assist  Sit to supine: Min assist  General bed mobility comments: Assist for LLE to get back into bed.    Transfers  Overall transfer level: Needs assistance  Equipment used: Rolling walker (2  wheeled)  Transfers: Sit to/from Stand  Sit to Stand: Min assist  General transfer comment: Pt requires mod cues for hand placement and safety to keep RW with him until all the way at seating surface. Also requires assist for increased forward weight shift.    Ambulation / Gait / Stairs / Wheelchair Mobility  Ambulation/Gait  Ambulation/Gait assistance: Regulatory affairs officer (Feet): 70 Feet (x 2 reps)  Assistive device: Rolling walker (2 wheeled)  Gait Pattern/deviations: Step-through pattern;Decreased stride length;Trunk flexed;Narrow base of support  Gait velocity: decreased- 1 standing rest break  General Gait Details: Pt continues to demonstrate forward flexed posture with balance deficits esp due to decreased sensation in R and LLE. Does better with use of RW, however does require intermittent standing breaks due to fatigue. Pts sats remained in 90's throughout on RA and HR to 76.    Posture / Balance     Special needs/care consideration  BiPAP/CPAP no  CPM no  Continuous Drip IV no  Dialysis no  Life Vest no  Oxygen no  Special Bed no  Trach Size no  Wound Vac (area) no  Skin - current wound on L foot from existing diabetic sore, currently wrapped and followed by wound care. Forearms and hands with multiple bruises ("from that blood thinner" per pt)  Bowel mgmt: last BM on 11-11-13  Bladder mgmt: currently using urinal  Diabetic mgmt - yes, managed at home with insulin  Note: pt with R IJ line  Note: pt is blind in R eye    Previous Home Environment  Living Arrangements: Spouse/significant other;Children  Available Help at Discharge: Available 24 hours/day;Family (children work days and wife with h/o CVA and uses a walker)  Type of Home: House  Home Layout: One level  Home Access: Stairs to enter  Entrance Stairs-Rails: Left  Entrance Stairs-Number of Steps: 4  Bathroom Toilet: Standard  Home Care Services: No  Additional Comments: PTA: patient took sponge bathes.  Recommend grab bar at toilet and unclear if patient has 3 in 1 commode  Discharge Living Setting  Plans for Discharge Living Setting: Patient's home  Type of Home at Discharge: House  Discharge Home Layout: One level  Discharge Home Access: Stairs to enter  Entrance Stairs-Rails: Right;Left  Entrance Stairs-Number of Steps: 3  Does the patient have any problems obtaining your medications?: No  Social/Family/Support Systems  Patient Roles: Spouse  Contact Information: son Loraine Leriche is primary conact. Both pt/son state that pt's wife has had a CVA and has trouble with comprehension of medical issues at times.  Anticipated Caregiver: wife and son (note that pt's goals are for Mod. Ind)  Anticipated Caregiver's Contact Information: see above  Ability/Limitations of Caregiver: wife has some cognitive issues following a CVA but is functional on a walker  Caregiver Availability: Other (Comment) (son will also be checking on pt around his work schedule. Son lives in [place].)  Discharge Plan Discussed with Primary Caregiver:  Yes  Is Caregiver In Agreement with Plan?: Yes  Does Caregiver/Family have Issues with Lodging/Transportation while Pt is in Rehab?: No  Goals/Additional Needs  Patient/Family Goal for Rehab: Mod indep with PT/OT, NA for SLP  Expected length of stay: 7 days  Cultural Considerations: none  Dietary Needs: heart healthy, carb modified  Equipment Needs: to be determined  Pt/Family Agrees to Admission and willing to participate: Yes (spoke with pt's son by phone on 8-12 and 8-13)  Program Orientation Provided & Reviewed with Pt/Caregiver Including Roles & Responsibilities: Yes  Decrease burden of Care through IP rehab admission: NA  Possible need for SNF placement upon discharge: not anticipated  Patient Condition: This patient's condition remains as documented in the consult dated 11-12-13, in which the Rehabilitation Physician determined and documented that the patient's condition  is appropriate for intensive rehabilitative care in an inpatient rehabilitation facility. Will admit to inpatient rehab today.  Preadmission Screen Completed By: Juliann Mule, PT 11/13/2013 10:25 AM  ______________________________________________________________________  Discussed status with Dr. Wynn Banker on 11-13-13 at 1025 and received telephone approval for admission today.  Admission Coordinator: Juliann Mule, PT time1025/Date 11/13/2013    Cosigned by: Erick Colace, MD [11/13/2013 10:45 AM]
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
## 2013-11-14 NOTE — Progress Notes (Signed)
Orthopedic Tech Progress Note Patient Details:  [name] 02-Sep-1933 [id number] Unna boot put on by wound care, and prafo boot called in to [facility]. Patient ID: [name], male   DOB: Apr 16, 1933, 80 y.o.   MRN: [id number]   [name] 11/14/2013, 4:52 PM

## 2013-11-14 NOTE — Discharge Instructions (Signed)
Inpatient Rehab Discharge Instructions  Forrester Cotrell Discharge date and time: No discharge date for patient encounter.   Activities/Precautions/ Functional Status: Activity: activity as tolerated Diet: diabetic diet Wound Care: keep wound clean and dry Functional status:  ___ No restrictions     ___ Walk up steps independently ___ 24/7 supervision/assistance   ___ Walk up steps with assistance ___ Intermittent supervision/assistance  ___ Bathe/dress independently ___ Walk with walker     ___ Bathe/dress with assistance ___ Walk Independently    ___ Shower independently _x__ Walk with assistance    ___ Shower with assistance ___ No alcohol     ___ Return to work/school ________  Special Instructions: Home health nurse to check INR with results to Dr. Quita Skye [phone] fax number [phone]   My questions have been answered and I understand these instructions. I will adhere to these goals and the provided educational materials after my discharge from the hospital.  Patient/Caregiver Signature _______________________________ Date __________  Clinician Signature _______________________________________ Date __________  Please bring this form and your medication list with you to all your follow-up doctor's appointments.

## 2013-11-14 NOTE — Progress Notes (Signed)
ANTICOAGULATION CONSULT NOTE - Follow up  Pharmacy Consult for Coumadin Indication: atrial fibrillation, left atrial thrombus  No Known Allergies  Patient Measurements: Height: 5\' 7"  (170.2 cm) Weight: 186 lb 15.2 oz (84.8 kg) IBW/kg (Calculated) : 66.1  Vital Signs: Temp: 97.7 F (36.5 C) (08/14 1354) Temp src: Oral (08/14 1354) BP: 130/45 mmHg (08/14 1354) Pulse Rate: 43 (08/14 1354)  Labs:  Recent Labs  11/12/13 0529 11/13/13 0530 11/14/13 0436  HGB  --   --  9.2*  HCT  --   --  28.4*  PLT  --   --  219  LABPROT 33.3* 29.3* 27.5*  INR 3.27* 2.77* 2.56*  CREATININE  --   --  1.78*    Estimated Creatinine Clearance: 35.6 ml/min (by C-G formula based on Cr of 1.78).  Assessment: 80 year old male continues Coumadin for Afib and left atrial thrombus.  S/p pericardial window 8/7.  Last Coumadin dose was 8/9, then held x 3 days for elevated INR.  Noted possible drug interactions with Amiodarone and Doxycycline. INR 2.56 today. hgb 9.2, plt 219, no bleeding noted per chart.   Goal of Therapy:  INR 2-3 Monitor platelets by anticoagulation protocol: Yes   Plan:  1) Coumadin 2.5mg  PO x 1 tonight. 2) Daily INR  Bayard Hugger, PharmD, BCPS  Clinical Pharmacist  Pager: [phone]  11/14/2013 3:10 PM
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
## 2013-11-14 NOTE — Progress Notes (Signed)
Admission skin assessment completed with Barry Ricks, PA. Patient with surgical incision upper chest/ abdomen,incision with skin glue intact,raised area at the top of the incision, area with yellow tissue covered with dry dressing, scrotal edema, Unna Boot to left leg placed this am by [facility] nurse. Roberts-VonCannon, Shawndrea Rutkowski Elon Jester

## 2013-11-15 ENCOUNTER — Encounter (HOSPITAL_COMMUNITY): Payer: Medicare Other | Admitting: Occupational Therapy

## 2013-11-15 ENCOUNTER — Inpatient Hospital Stay (HOSPITAL_COMMUNITY): Payer: Medicare Other | Admitting: Physical Therapy

## 2013-11-15 DIAGNOSIS — L97509 Non-pressure chronic ulcer of other part of unspecified foot with unspecified severity: Secondary | ICD-10-CM

## 2013-11-15 DIAGNOSIS — D638 Anemia in other chronic diseases classified elsewhere: Secondary | ICD-10-CM

## 2013-11-15 LAB — GLUCOSE, CAPILLARY
GLUCOSE-CAPILLARY: 114 mg/dL — AB (ref 70–99)
GLUCOSE-CAPILLARY: 125 mg/dL — AB (ref 70–99)
Glucose-Capillary: 114 mg/dL — ABNORMAL HIGH (ref 70–99)
Glucose-Capillary: 136 mg/dL — ABNORMAL HIGH (ref 70–99)

## 2013-11-15 LAB — PROTIME-INR
INR: 3.27 — ABNORMAL HIGH (ref 0.00–1.49)
PROTHROMBIN TIME: 33.3 s — AB (ref 11.6–15.2)

## 2013-11-15 MED ORDER — AMLODIPINE BESYLATE 2.5 MG PO TABS
2.5000 mg | ORAL_TABLET | Freq: Every day | ORAL | Status: DC
  Administered 2013-11-16 – 2013-11-27 (×11): 2.5 mg via ORAL
  Filled 2013-11-15 (×13): qty 1

## 2013-11-15 MED ORDER — CHLORTHALIDONE 25 MG PO TABS
25.0000 mg | ORAL_TABLET | Freq: Every day | ORAL | Status: DC
  Administered 2013-11-15 – 2013-11-27 (×13): 25 mg via ORAL
  Filled 2013-11-15 (×14): qty 1

## 2013-11-15 NOTE — Progress Notes (Signed)
  Subjective: No new complaints. No new problems. Feeling OK.  Objective: Vital signs in last 24 hours: Temp:  [97.5 F (36.4 C)-97.7 F (36.5 C)] 97.5 F (36.4 C) (08/14 2129) Pulse Rate:  [43-77] 77 (08/15 0814) Resp:  [16-18] 18 (08/15 0522) BP: (130-168)/(45-67) 168/60 mmHg (08/15 0522) SpO2:  [91 %-97 %] 91 % (08/15 0814) Weight change:  Last BM Date: 11/14/13  Intake/Output from previous day: 08/14 0701 - 08/15 0700 In: 740 [P.O.:740] Out: 650 [Urine:650] Last cbgs: CBG (last 3)   Recent Labs  11/14/13 1140 11/14/13 1644 11/14/13 2051  GLUCAP 119* 127* 142*     Physical Exam General: No apparent distress   HEENT: not dry Lungs: Normal effort. Lungs clear to auscultation, no crackles or wheezes. Cardiovascular: Regular rate and rhythm, no edema Abdomen: S/NT/ND; BS(+) Musculoskeletal:  unchanged Neurological: No new neurological deficits Wounds: dressing is soaked on the back of L heel, odor present. Deep 1 cm ulcer w/metal hardware is present on his L posterior heel LLE 1+ edema Skin: clear  Aging changes Mental state: Alert, oriented, cooperative    Lab Results: BMET    Component Value Date/Time   NA 139 11/14/2013 0436   K 4.8 11/14/2013 0436   CL 107 11/14/2013 0436   CO2 23 11/14/2013 0436   GLUCOSE 130* 11/14/2013 0436   BUN 25* 11/14/2013 0436   CREATININE 1.78* 11/14/2013 0436   CALCIUM 8.5 11/14/2013 0436   GFRNONAA 35* 11/14/2013 0436   GFRAA 40* 11/14/2013 0436   CBC    Component Value Date/Time   WBC 6.5 11/14/2013 0436   RBC 3.29* 11/14/2013 0436   HGB 9.2* 11/14/2013 0436   HCT 28.4* 11/14/2013 0436   PLT 219 11/14/2013 0436   MCV 86.3 11/14/2013 0436   MCH 28.0 11/14/2013 0436   MCHC 32.4 11/14/2013 0436   RDW 16.3* 11/14/2013 0436   LYMPHSABS 1.3 11/14/2013 0436   MONOABS 0.4 11/14/2013 0436   EOSABS 0.1 11/14/2013 0436   BASOSABS 0.0 11/14/2013 0436    Studies/Results: No results found.  Medications: I have reviewed the patient's  current medications.  Assessment/Plan:  1. Functional deficits secondary to deconditioning after pericardial effusion status post subxiphoid pericardial window 11/09/2013  2. DVT Prophylaxis/Anticoagulation: Initiation of Coumadin for LAAthrombus as well intermittent atrial fibrillation. Monitor for any bleeding episodes  3. Pain Management: Oxycodone as needed. Monitor with increased mobility  4. acute blood loss anemia. Continue iron supplement. Followup CBC  5. Neuropsych: This patient is capable of making decisions on his own behalf.  6. Skin/Wound Care/chronic diabetic left foot ulcer.:  Deep 1 cm ulcer w/metal hardware is present on his L posterior heel. Pt says Dr Lajoyce Corners examined his heel wound yesterday. Followup wound care nurse. Continue Radio broadcast assistant. Doxycycline for wound coverage. Treat leg edema w/a diuretic; reduce Norvasc dose. 7. Diabetes mellitus with peripheral neuropathy. Latest hemoglobin A1c 5.9. Check blood sugars a.c. and at bedtime. Continue sliding scale insulin  8. Hypertension/atrial fibrillation. Lopressor 12.5 mg twice a day, Norvasc 5 mg daily, amiodarone 400 mg daily. Cardiac rate control. Monitor with increased mobility  9. Chronic renal insufficiency. Baseline creatinine 1.56. Followup chemistries  10. Hyperlipidemia. Zocor      Length of stay, days: 2  Sonda Primes , MD 11/15/2013, 9:14 AM

## 2013-11-15 NOTE — Progress Notes (Signed)
ANTICOAGULATION CONSULT NOTE - Follow up  Pharmacy Consult for warfarin Indication: atrial fibrillation, left atrial thrombus  No Known Allergies  Patient Measurements: Height: 5\' 7"  (170.2 cm) Weight: 186 lb 15.2 oz (84.8 kg) IBW/kg (Calculated) : 66.1  Vital Signs: BP: 168/60 mmHg (08/15 0522) Pulse Rate: 77 (08/15 0814)  Labs:  Recent Labs  11/13/13 0530 11/14/13 0436 11/15/13 0447  HGB  --  9.2*  --   HCT  --  28.4*  --   PLT  --  219  --   LABPROT 29.3* 27.5* 33.3*  INR 2.77* 2.56* 3.27*  CREATININE  --  1.78*  --     Estimated Creatinine Clearance: 35.6 ml/min (by C-G formula based on Cr of 1.78).  Assessment: 80 year old male continues warfarin for Afib and left atrial thrombus.  S/p pericardial window 8/7. INR became supratherapuetic on 8/10 after receiving 2 doses of 5mg  and warfarin was held 8/10-8/12. Resumed 8/13 - has received 2 doses of 2.5mg . INR this morning is again supratherapeutic at 3.27. This is likely frome drug interactions with Amiodarone and Doxycycline.  Hgb low but stable, plts wnl. No bleeding noted.  Goal of Therapy:  INR 2-3 Monitor platelets by anticoagulation protocol: Yes   Plan:  1. Hold warfarin tonight  2. Daily INR 3. Follow up LOT for doxycycline as this is affecting INR  Schneider Warchol D. Kennith Morss, PharmD, BCPS Clinical Pharmacist Pager: [phone] 11/15/2013 11:12 AM

## 2013-11-15 NOTE — Progress Notes (Signed)
Patients' blood pressure 145/59 heart rate 50, called Pharmacy spoke with K. Hyatt, discussed holding lopressor due to low heart rate. Priscille Heidelberg suggested for parameters be set. Held lopressor.  Diamantina Monks, RN

## 2013-11-15 NOTE — Progress Notes (Signed)
Physical Therapy Session Note  Patient Details  Name: [name] MRN: [id number] Date of Birth: 1933/09/28  Today's Date: 11/15/2013 Time: 0800-0900 Tx 2: [id number] Time Calculation (min): 60 min Tx 2: 60 minutes  Short Term Goals: Week 1:  PT Short Term Goal 1 (Week 1): STGs=LTGs due to ELOS  Skilled Therapeutic Interventions/Progress Updates:    Therapeutic Activity: PT instructs pt in rolling L in bed req CGA, L side lie to sit req min A and verbal cues for hand placement and sequencing. PT instructs pt in sequencing and hand placement for sit to stand from edge of bed with RW req CGA and stand-step transfer bed to w/c req CGA-SBA with RW. PT instructs pt in sit to stand with RW req SBA and multiple verbal cues for hand placement due to pt's poor memory.   Gait Training: PT instructs pt in ambulation with RW x 60' + req CGA-SBA for safety - verbal cues for upright posture. PT instructs pt on ascending/descending 3 standard height stairs with B rails req SBA in step-to pattern and verbal cues to place entire foot on the stair x 2 sets.   W/C Management: PT exchanged pt's w/c to a hemi height 18x16 manual w/c for improved efficiency of propulsion. PT instructs pt in w/c propulsion x 110' req SBA and verbal cues for large strokes. Pt continues to require physical assist to place/remove legrests due to poor eyesight and poor memory.   Pt's endurance is slowly improving, but continues to be limited. Pt will benefit from continued activity tolerance training, standing balance reeducation, and generalized strengthening.   Tx 2: Gait Training: PT instructs pt in ambulation with RW x 85' x 2 reps with seated rest break req. Pt desaturates to 90% on RA, but recovers to 95% in less than one minute.   Neuromuscular Reeducation: PT instructs pt in horseshoe tossing activity in stand without AD req CGA with 2 LOB posterior, resulting in pt sitting in w/c and one LOB to the L with pt self  correcting. PT instructs pt in picking up horseshoes from varied height surfaces, working on reaching req CGA-min A with RW. Pt desaturates to 86% with exertion of this activity, but recovers to > 92% in less than a minute on RA. Pt demonstrates 1 LOBbringing the horseshoe back to the bin, but PT provides assist to prevent a fall.   W/C Management: PT instructs pt in w/c propulsion with B UEs x 150' req SBA and verbal cues for technique. Pt continues to be unable to find the legrest release lever and is unable to remember where it is after PT shows him.    Pt's activity tolerance is slowly improving and he is tolerating higher level balance training. Pt demonstrates impaired reactive balance, evidenced by LOB after tossing horseshoes, as well as proactive balance, from planning on putting the horseshoe in the bin. Pt will benefit from continued activity tolerance training, standing balance reeduccation, stair training, and progression of safety with gait on level surfaces, as well as gait endurance.     Therapy Documentation Precautions:  Precautions Precautions: Fall Precaution Comments: wears Radio broadcast assistant when ambulating Restrictions Weight Bearing Restrictions: Yes LLE Weight Bearing: Weight bearing as tolerated    Vital Signs: Therapy Vitals Pulse Rate: 77 Resp: 18 BP: 168/60 mmHg Patient Position (if appropriate): Sitting Oxygen Therapy SpO2: 91 % O2 Device: None (Room air) Pain: Pain Assessment Pain Assessment: 0-10 Pain Score: 5  Pain Type: Surgical pain Pain Location: Chest Pain  Orientation: Mid Pain Onset: On-going Pain Intervention(s): Rest Multiple Pain Sites: No Tx 2: Pt denies pain See FIM for current functional status  Therapy/Group: Individual Therapy  Cosette Prindle M 11/15/2013, 8:15 AM

## 2013-11-15 NOTE — Progress Notes (Signed)
Occupational Therapy Session Note  Patient Details  Name: [name] MRN: [id number] Date of Birth: [birth year]  Today's Date: 11/15/2013 Time: 1000-1100 Time Calculation (min): 60 min  Short Term Goals: Week 1:  OT Short Term Goal 1 (Week 1): STG = LTGs due to ELOS  Skilled Therapeutic Interventions/Progress Updates:    1:1 self care retraining including bed mobility without the bed rail, dynamic sitting balance, sit to stand, standing balance without UE support, functional ambulation around room and in and out of bathroom with RW with steady A, toleting in standing, toilet transfer, etc. Pt still with no clothing.  Pt required a for bathing lower LEs and feet- pt unable to reach down to feet to doff donn socks and shoes and wash feet.  Assisted pt with shaving at the sink.  Pt requested to return to bed at end of session. Assisted pt with elevating left LE and scrotum to decr swelling.   Therapy Documentation Precautions:  Precautions Precautions: Fall Precaution Comments: wears Unna boot when ambulating Restrictions Weight Bearing Restrictions: Yes LLE Weight Bearing: Weight bearing as tolerated  Pain: No c/o pain in session  See FIM for current functional status  Therapy/Group: Individual Therapy  Roney Mans [facility] 11/15/2013, 10:50 AM

## 2013-11-16 ENCOUNTER — Inpatient Hospital Stay (HOSPITAL_COMMUNITY): Payer: Medicare Other | Admitting: Occupational Therapy

## 2013-11-16 DIAGNOSIS — I150 Renovascular hypertension: Secondary | ICD-10-CM

## 2013-11-16 LAB — GLUCOSE, CAPILLARY
Glucose-Capillary: 111 mg/dL — ABNORMAL HIGH (ref 70–99)
Glucose-Capillary: 116 mg/dL — ABNORMAL HIGH (ref 70–99)
Glucose-Capillary: 148 mg/dL — ABNORMAL HIGH (ref 70–99)

## 2013-11-16 LAB — PROTIME-INR
INR: 3.16 — AB (ref 0.00–1.49)
PROTHROMBIN TIME: 32.4 s — AB (ref 11.6–15.2)

## 2013-11-16 NOTE — Progress Notes (Signed)
ANTICOAGULATION CONSULT NOTE - Follow up  Pharmacy Consult for warfarin Indication: atrial fibrillation, left atrial thrombus  No Known Allergies  Patient Measurements: Height: 5\' 7"  (170.2 cm) Weight: 186 lb 15.2 oz (84.8 kg) IBW/kg (Calculated) : 66.1  Vital Signs: Temp: 97.8 F (36.6 C) (08/16 0442) Temp src: Oral (08/16 0442) BP: 165/64 mmHg (08/16 0442) Pulse Rate: 60 (08/16 0442)  Labs:  Recent Labs  11/14/13 0436 11/15/13 0447 11/16/13 0555  HGB 9.2*  --   --   HCT 28.4*  --   --   PLT 219  --   --   LABPROT 27.5* 33.3* 32.4*  INR 2.56* 3.27* 3.16*  CREATININE 1.78*  --   --     Estimated Creatinine Clearance: 35.6 ml/min (by C-G formula based on Cr of 1.78).  Assessment: 80 year old male continues warfarin for Afib and left atrial thrombus.  S/p pericardial window 8/7. INR became supratherapuetic on 8/10 after receiving 2 doses of 5mg  and warfarin was held 8/10-8/12. Resumed 8/13 - has received 2 doses of 2.5mg . INR yesterday again supratherapeutic at 3.27. This morning, INR down slightly to 3.17, still SUPRAtherapeutic. This is likely frome drug interactions with Amiodarone and Doxycycline.  Hgb low but stable, plts wnl. No bleeding noted.  Goal of Therapy:  INR 2-3 Monitor platelets by anticoagulation protocol: Yes   Plan:  1. Hold warfarin again tonight  2. Daily INR 3. Follow up LOT for doxycycline as this is affecting INR  Rolondo Pierre D. Ethel Meisenheimer, PharmD, BCPS Clinical Pharmacist Pager: [phone] 11/16/2013 9:19 AM

## 2013-11-16 NOTE — Progress Notes (Signed)
  Subjective: No new complaints. No new problems. Feeling OK.  Objective: Vital signs in last 24 hours: Temp:  [97.8 F (36.6 C)-98.7 F (37.1 C)] 97.8 F (36.6 C) (08/16 0442) Pulse Rate:  [60-64] 60 (08/16 0442) Resp:  [18-20] 18 (08/16 0442) BP: (146-171)/(50-64) 165/64 mmHg (08/16 0442) SpO2:  [96 %-100 %] 96 % (08/16 0442) Weight change:  Last BM Date: 11/15/13  Intake/Output from previous day: 08/15 0701 - 08/16 0700 In: 720 [P.O.:720] Out: 1125 [Urine:1125] Last cbgs: CBG (last 3)   Recent Labs  11/15/13 2136 11/16/13 0721 11/16/13 1118  GLUCAP 125* 111* 116*     Physical Exam General: No apparent distress   HEENT: not dry Lungs: Normal effort. Lungs clear to auscultation, no crackles or wheezes. Cardiovascular: Regular rate and rhythm, no edema Abdomen: S/NT/ND; BS(+) Musculoskeletal:  unchanged Neurological: No new neurological deficits Wounds: dressing is soaked on the back of L heel, odor present. Deep 1 cm ulcer w/metal hardware is present on his L posterior heel Chest wound is clear LLE 1+ edema Skin: clear  Aging changes Mental state: Alert, oriented, cooperative    Lab Results: BMET    Component Value Date/Time   NA 139 11/14/2013 0436   K 4.8 11/14/2013 0436   CL 107 11/14/2013 0436   CO2 23 11/14/2013 0436   GLUCOSE 130* 11/14/2013 0436   BUN 25* 11/14/2013 0436   CREATININE 1.78* 11/14/2013 0436   CALCIUM 8.5 11/14/2013 0436   GFRNONAA 35* 11/14/2013 0436   GFRAA 40* 11/14/2013 0436   CBC    Component Value Date/Time   WBC 6.5 11/14/2013 0436   RBC 3.29* 11/14/2013 0436   HGB 9.2* 11/14/2013 0436   HCT 28.4* 11/14/2013 0436   PLT 219 11/14/2013 0436   MCV 86.3 11/14/2013 0436   MCH 28.0 11/14/2013 0436   MCHC 32.4 11/14/2013 0436   RDW 16.3* 11/14/2013 0436   LYMPHSABS 1.3 11/14/2013 0436   MONOABS 0.4 11/14/2013 0436   EOSABS 0.1 11/14/2013 0436   BASOSABS 0.0 11/14/2013 0436    Studies/Results: No results found.  Medications: I have  reviewed the patient's current medications.  Assessment/Plan:  1. Functional deficits secondary to deconditioning after pericardial effusion status post subxiphoid pericardial window 11/09/2013  2. DVT Prophylaxis/Anticoagulation: Initiation of Coumadin for LAAthrombus as well intermittent atrial fibrillation. Monitor for any bleeding episodes  3. Pain Management: Oxycodone as needed. Monitor with increased mobility  4. acute blood loss anemia. Continue iron supplement. Followup CBC  5. Neuropsych: This patient is capable of making decisions on his own behalf.  6. Skin/Wound Care/chronic diabetic left foot ulcer.:  Deep 1 cm ulcer w/metal hardware is present on his L posterior heel. Pt says Dr Lajoyce Corners examined his heel wound yesterday. Followup wound care nurse. Continue Radio broadcast assistant. Doxycycline for wound coverage. Treat leg edema w/a diuretic; reduce Norvasc dose. 7. Diabetes mellitus with peripheral neuropathy. Latest hemoglobin A1c 5.9. Check blood sugars a.c. and at bedtime. Continue sliding scale insulin  8. Hypertension/atrial fibrillation. Lopressor 12.5 mg twice a day, Norvasc 5 mg daily, amiodarone 400 mg daily. Cardiac rate control. Monitor with increased mobility  9. Chronic renal insufficiency. Baseline creatinine 1.56. Followup chemistries  10. Hyperlipidemia. Zocor      Length of stay, days: 3  Sonda Primes , MD 11/16/2013, 11:37 AM

## 2013-11-16 NOTE — Progress Notes (Signed)
Occupational Therapy Session Note  Patient Details  Name: [name] MRN: [id number] Date of Birth: 28-May-1933  Today's Date: 11/16/2013 Time: 0730-840=70 minutes   Skilled Therapeutic Interventions/Progress Updates: toileting and then ADL in w/c at sink.  Patient will benefit from bed mobilitly, supine-sit, all transfers and lower body dressing with adaptive equipment as his wife has had a CVA previously.  Patient stated "I can't feel much down there [feet due to diabetic neuropathy].   He required much assist for lower body.   He stated his family will bring in clothing tonight.     Therapy Documentation Precautions:  Precautions Precautions: Fall Precaution Comments: wears Unna boot when ambulating Restrictions Weight Bearing Restrictions: No LLE Weight Bearing: Weight bearing as tolerated  Pain:denied    See FIM for current functional status  Therapy/Group: Individual Therapy  Bud Face [facility] 11/16/2013, 7:54 AM

## 2013-11-16 NOTE — Progress Notes (Signed)
Orthopedic Tech Progress Note Patient Details:  [name] September 21, 1933 [id number] Prafo boot applied Ortho Devices Type of Ortho Device: Postop shoe/boot;Other (comment) Ortho Device/Splint Location: LLE Ortho Device/Splint Interventions: Application   [name] 11/16/2013, 12:38 PM

## 2013-11-16 NOTE — Plan of Care (Signed)
Problem: RH SKIN INTEGRITY Goal: RH STG ABLE TO PERFORM INCISION/WOUND CARE W/ASSISTANCE STG Able To Perform Incision/Wound Care With mod Assistance.  Outcome: Progressing Left foot wound - wound care  RN following  Re: dressing changes

## 2013-11-17 ENCOUNTER — Inpatient Hospital Stay (HOSPITAL_COMMUNITY): Payer: Medicare Other | Admitting: Occupational Therapy

## 2013-11-17 ENCOUNTER — Encounter (HOSPITAL_COMMUNITY): Payer: Medicare Other

## 2013-11-17 ENCOUNTER — Inpatient Hospital Stay (HOSPITAL_COMMUNITY): Payer: Medicare Other

## 2013-11-17 DIAGNOSIS — R5381 Other malaise: Secondary | ICD-10-CM

## 2013-11-17 DIAGNOSIS — N183 Chronic kidney disease, stage 3 unspecified: Secondary | ICD-10-CM

## 2013-11-17 DIAGNOSIS — D638 Anemia in other chronic diseases classified elsewhere: Secondary | ICD-10-CM

## 2013-11-17 DIAGNOSIS — Z5189 Encounter for other specified aftercare: Secondary | ICD-10-CM

## 2013-11-17 DIAGNOSIS — I4891 Unspecified atrial fibrillation: Secondary | ICD-10-CM

## 2013-11-17 LAB — URINALYSIS, ROUTINE W REFLEX MICROSCOPIC
Bilirubin Urine: NEGATIVE
GLUCOSE, UA: NEGATIVE mg/dL
HGB URINE DIPSTICK: NEGATIVE
Ketones, ur: NEGATIVE mg/dL
LEUKOCYTES UA: NEGATIVE
Nitrite: NEGATIVE
Protein, ur: NEGATIVE mg/dL
SPECIFIC GRAVITY, URINE: 1.015 (ref 1.005–1.030)
UROBILINOGEN UA: 0.2 mg/dL (ref 0.0–1.0)
pH: 5 (ref 5.0–8.0)

## 2013-11-17 LAB — GLUCOSE, CAPILLARY
GLUCOSE-CAPILLARY: 99 mg/dL (ref 70–99)
Glucose-Capillary: 159 mg/dL — ABNORMAL HIGH (ref 70–99)
Glucose-Capillary: 114 mg/dL — ABNORMAL HIGH (ref 70–99)
Glucose-Capillary: 155 mg/dL — ABNORMAL HIGH (ref 70–99)
Glucose-Capillary: 168 mg/dL — ABNORMAL HIGH (ref 70–99)

## 2013-11-17 LAB — PROTIME-INR
INR: 2.7 — ABNORMAL HIGH (ref 0.00–1.49)
Prothrombin Time: 28.7 seconds — ABNORMAL HIGH (ref 11.6–15.2)

## 2013-11-17 MED ORDER — WARFARIN SODIUM 1 MG PO TABS
1.0000 mg | ORAL_TABLET | Freq: Once | ORAL | Status: AC
  Administered 2013-11-17: 1 mg via ORAL
  Filled 2013-11-17: qty 1

## 2013-11-17 NOTE — Progress Notes (Signed)
80 y.o. right-handed male with history of previous CVA with little residual, hypertension, diabetes mellitus peripheral neuropathy with chronic nonhealing foot ulcers, chronic renal insufficiency with baseline creatinine 1.56 as well as pericarditis associated with pericardial effusion which was identified during workup of acute CVA. Patient used a walker prior to admission. Wife also with history of CVA and she also uses a walker. Presented 11/02/2013 with chest tightness and palpitations. Echocardiogram with ejection fraction of 50% large pericardial effusion identified features consistent with a moderate tamponade as well as incidental findings of LAA thrombus. Underwent percutaneous pericardiocentesis 11/03/2013 per Dr. [place] with removal proximally 750 mL of fluid. Followup echocardiogram showed resolution of pericardial effusion with echocardiogram again later completed showing some reaccumulation of fluid  Subjective/Complaints: Complaining of difficulty with leaking/wetting himself. Denies pain or frank difficulty emptying  Objective: Vital Signs: Blood pressure 153/65, pulse 52, temperature 98 F (36.7 C), temperature source Oral, resp. rate 18, height 5\' 7"  (1.702 m), weight 84.8 kg (186 lb 15.2 oz), SpO2 96.00%. No results found. Results for orders placed during the hospital encounter of 11/13/13 (from the past 72 hour(s))  GLUCOSE, CAPILLARY     Status: Abnormal   Collection Time    11/14/13 11:40 AM      Result Value Ref Range   Glucose-Capillary 119 (*) 70 - 99 mg/dL   Comment 1 Notify RN    GLUCOSE, CAPILLARY     Status: Abnormal   Collection Time    11/14/13  4:44 PM      Result Value Ref Range   Glucose-Capillary 127 (*) 70 - 99 mg/dL  GLUCOSE, CAPILLARY     Status: Abnormal   Collection Time    11/14/13  8:51 PM      Result Value Ref Range   Glucose-Capillary 142 (*) 70 - 99 mg/dL  PROTIME-INR     Status: Abnormal   Collection Time    11/15/13  4:47 AM      Result  Value Ref Range   Prothrombin Time 33.3 (*) 11.6 - 15.2 seconds   INR 3.27 (*) 0.00 - 1.49  GLUCOSE, CAPILLARY     Status: Abnormal   Collection Time    11/15/13  7:16 AM      Result Value Ref Range   Glucose-Capillary 114 (*) 70 - 99 mg/dL   Comment 1 Notify RN    GLUCOSE, CAPILLARY     Status: Abnormal   Collection Time    11/15/13 11:27 AM      Result Value Ref Range   Glucose-Capillary 114 (*) 70 - 99 mg/dL   Comment 1 Notify RN    GLUCOSE, CAPILLARY     Status: Abnormal   Collection Time    11/15/13  4:14 PM      Result Value Ref Range   Glucose-Capillary 136 (*) 70 - 99 mg/dL   Comment 1 Notify RN    GLUCOSE, CAPILLARY     Status: Abnormal   Collection Time    11/15/13  9:36 PM      Result Value Ref Range   Glucose-Capillary 125 (*) 70 - 99 mg/dL  PROTIME-INR     Status: Abnormal   Collection Time    11/16/13  5:55 AM      Result Value Ref Range   Prothrombin Time 32.4 (*) 11.6 - 15.2 seconds   INR 3.16 (*) 0.00 - 1.49  GLUCOSE, CAPILLARY     Status: Abnormal   Collection Time    11/16/13  7:21  AM      Result Value Ref Range   Glucose-Capillary 111 (*) 70 - 99 mg/dL   Comment 1 Notify RN    GLUCOSE, CAPILLARY     Status: Abnormal   Collection Time    11/16/13 11:18 AM      Result Value Ref Range   Glucose-Capillary 116 (*) 70 - 99 mg/dL   Comment 1 Notify RN    GLUCOSE, CAPILLARY     Status: Abnormal   Collection Time    11/16/13  4:34 PM      Result Value Ref Range   Glucose-Capillary 148 (*) 70 - 99 mg/dL   Comment 1 Notify RN    PROTIME-INR     Status: Abnormal   Collection Time    11/17/13  5:00 AM      Result Value Ref Range   Prothrombin Time 28.7 (*) 11.6 - 15.2 seconds   INR 2.70 (*) 0.00 - 1.49  GLUCOSE, CAPILLARY     Status: None   Collection Time    11/17/13  7:23 AM      Result Value Ref Range   Glucose-Capillary 99  70 - 99 mg/dL   Comment 1 Notify RN       HEENT: normal Cardio: RRR Resp: CTA B/L GI: BS positive and NT,  ND Extremity:  Edema R pre tib, LLE in UNNA boot Skin:   Intact and Wound UNNA boot , new no soak through, + foul odor continues, area non-tender Neuro: Alert/Oriented and Abnormal Motor 3-/5 Left HF, KE, Ankle NT secondary to UNNA, 4/5 BUE andRLE Musc/Skel:  Other No calf tenderness GEN NAD GU: scrotum swollen to size of grapefruit, nontender, minimally red. Penis receded into scrotal swelling   Assessment/Plan: 1. Functional deficits secondary to deconditioning secondary to pericardial effusion which require 3+ hours per day of interdisciplinary therapy in a comprehensive inpatient rehab setting. Physiatrist is providing close team supervision and 24 hour management of active medical problems listed below. Physiatrist and rehab team continue to assess barriers to discharge/monitor patient progress toward functional and medical goals. FIM: FIM - Bathing Bathing Steps Patient Completed: Chest;Right Arm;Left Arm;Abdomen;Front perineal area;Right upper leg;Left upper leg Bathing: 3: Mod-Patient completes 5-7 [id number] 10 parts or 50-74%  FIM - Upper Body Dressing/Undressing Upper body dressing/undressing: 0: Wears gown/pajamas-no public clothing FIM - Lower Body Dressing/Undressing Lower body dressing/undressing steps patient completed: Fasten/unfasten left shoe;Fasten/unfasten right shoe;Don/Doff right shoe;Don/Doff left shoe Lower body dressing/undressing: 0: Wears gown/pajamas-no public clothing  FIM - Toileting Toileting steps completed by patient: Performs perineal hygiene Toileting: 2: Max-Patient completed 1 of 3 steps  FIM - Diplomatic Services operational officerToilet Transfers Toilet Transfers Assistive Devices: Grab bars Toilet Transfers: 4-To toilet/BSC: Min A (steadying Pt. > 75%);4-From toilet/BSC: Min A (steadying Pt. > 75%)  FIM - BankerBed/Chair Transfer Bed/Chair Transfer Assistive Devices: Walker;Bed rails Bed/Chair Transfer: 2: Supine > Sit: Max A (lifting assist/Pt. 25-49%);3: Bed > Chair or W/C: Mod A (lift or  lower assist);2: Chair or W/C > Bed: Max A (lift and lower assist)  FIM - Locomotion: Wheelchair Distance: 150 Locomotion: Wheelchair: 5: Travels 150 ft or more: maneuvers on rugs and over door sills with supervision, cueing or coaxing FIM - Locomotion: Ambulation Locomotion: Ambulation Assistive Devices: Designer, industrial/productWalker - Rolling Ambulation/Gait Assistance: 4: Min guard Locomotion: Ambulation: 2: Travels 50 - 149 ft with minimal assistance (Pt.>75%)  Comprehension Comprehension Mode: Auditory Comprehension: 5-Follows basic conversation/direction: With extra time/assistive device  Expression Expression Mode: Verbal Expression: 5-Expresses basic needs/ideas: With no assist  Social Interaction Social Interaction: 6-Interacts appropriately with others with medication or extra time (anti-anxiety, antidepressant).  Problem Solving Problem Solving: 5-Solves basic problems: With no assist  Memory Memory: 5-Recognizes or recalls 90% of the time/requires cueing < 10% of the time  Medical Problem List and Plan:   1. Functional deficits secondary to deconditioning after pericardial effusion status post subxiphoid pericardial window 11/09/2013   2. DVT Prophylaxis/Anticoagulation: Initiation of Coumadin for LAAthrombus as well intermittent atrial fibrillation. Monitor for any bleeding episodes   3. Pain Management: Oxycodone as needed. Monitor with increased mobility   4. acute blood loss anemia. Continue iron supplement. Followup CBC   5. Neuropsych: This patient is capable of making decisions on his own behalf.   6. Skin/Wound Care/chronic diabetic left foot ulcer.: Followup wound care nurse. Continue Radio broadcast assistant. Doxycycline for wound coverage   -continues to have odor 7. Diabetes mellitus with peripheral neuropathy. Latest hemoglobin A1c 5.9. Check blood sugars a.c. and at bedtime. Continue sliding scale insulin   8. Hypertension/atrial fibrillation. Lopressor 12.5 mg twice a day, Norvasc 5 mg  daily, amiodarone 400 mg daily. Cardiac rate control. Monitor with increased mobility   9. Chronic renal insufficiency. Baseline creatinine 1.56. Followup chemistries   10. Hyperlipidemia. Zocor   11. Urinary frequency: re-check urine/cx  -rx scrotal edema   LOS (Days) 4 A FACE TO FACE EVALUATION WAS PERFORMED  Octavia Velador T 11/17/2013, 8:41 AM

## 2013-11-17 NOTE — Care Management Note (Signed)
[facility] Individual Statement of Services  Patient Name:  [name]  Date:  11/17/2013  Welcome to the [facility].  Our goal is to provide you with an individualized program based on your diagnosis and situation, designed to meet your specific needs.  With this comprehensive rehabilitation program, you will be expected to participate in at least 3 hours of rehabilitation therapies Monday-Friday, with modified therapy programming on the weekends.  Your rehabilitation program will include the following services:  Physical Therapy (PT), Occupational Therapy (OT), 24 hour per day rehabilitation nursing, Therapeutic Recreaction (TR), Case Management (Social Worker), Rehabilitation Medicine, Nutrition Services and Pharmacy Services  Weekly team conferences will be held on Tuesdays to discuss your progress.  Your Social Worker will talk with you frequently to get your input and to update you on team discussions.  Team conferences with you and your family in attendance may also be held.  Expected length of stay: 7-10 days  Overall anticipated outcome: supervision/ min assist  Depending on your progress and recovery, your program may change. Your Social Worker will coordinate services and will keep you informed of any changes. Your Social Worker's name and contact numbers are listed  below.  The following services may also be recommended but are not provided by the [facility]:    Home Health Rehabiltiation Services  Outpatient Rehabilitation Services    Arrangements will be made to provide these services after discharge if needed.  Arrangements include referral to agencies that provide these services.  Your insurance has been verified to be:  Medicare and Medicaid Your primary doctor is:  Dr. [name]  Pertinent information will be shared with your doctor and your insurance company.  Social Worker:  [place], [place] [phone]  or ([phone]   Information discussed with and copy given to patient by: Amada Jupiter, 11/17/2013, 11:44 AM

## 2013-11-17 NOTE — Progress Notes (Signed)
ANTICOAGULATION CONSULT NOTE - Follow up  Pharmacy Consult for warfarin Indication: atrial fibrillation, left atrial thrombus  No Known Allergies  Patient Measurements: Height: 5\' 7"  (170.2 cm) Weight: 186 lb 15.2 oz (84.8 kg) IBW/kg (Calculated) : 66.1  Vital Signs: Temp: 98 F (36.7 C) (08/17 0524) Temp src: Oral (08/17 0524) BP: 153/65 mmHg (08/17 0524) Pulse Rate: 52 (08/17 0732)  Labs:  Recent Labs  11/15/13 0447 11/16/13 0555 11/17/13 0500  LABPROT 33.3* 32.4* 28.7*  INR 3.27* 3.16* 2.70*    Estimated Creatinine Clearance: 35.6 ml/min (by C-G formula based on Cr of 1.78).  Assessment: 80 year old male continues warfarin for Afib and left atrial thrombus. Held coumadin dose in the past 2 days d/t SUPRAtherapeutic INR, which is likely frome drug interactions with Amiodarone and Doxycycline. INR down to 2.7 today. No bleeding noted.  Goal of Therapy:  INR 2-3 Monitor platelets by anticoagulation protocol: Yes   Plan:  1. Coumadin 1mg  po x 1 2. Daily INR 3. Follow up LOT for doxycycline as this is affecting INR  Bayard Hugger, PharmD, BCPS  Clinical Pharmacist  Pager: [phone]  11/17/2013 12:59 PM

## 2013-11-17 NOTE — Progress Notes (Signed)
Occupational Therapy Session Note  Patient Details  Name: [name] MRN: [id number] Date of Birth: 1934/01/15  Today's Date: 11/17/2013 Time: 9381-8299 Time Calculation (min): 60 min  Short Term Goals: Week 1:  OT Short Term Goal 1 (Week 1): STG = LTGs due to ELOS  Skilled Therapeutic Interventions/Progress Updates: ADL-retraining with focus on dynamic sitting/standing balance, functional transfers, endurance, and improved functional reach.   Pt received supine in bed with scrotum elevated with towel roll and ice pack placed over groin.   Pt initially resistant to planned ADL but reconsidered after re-ed on benefit of movement and need for cleaning.   OT provided shower shield to dressing on sternum and coverage for left foot enabling pt to bathe thoroughly with improved confidence of safety.   Pt required steadying assist during transfers and min verbal cues for sequencing and to attend to use of RW and grab bars.   Pt required mod assist to wash buttocks and lower legs d/t impaired reach and joint stiffness at low back.   Pt transferred to w/c to sit at sink for grooming d/t fatigue and required max assist to shave d/t poor vision.    Per pt., his wife will bring in clothing and his sons gave him an electric shaver that he will use.    Therapy Documentation Precautions:  Precautions Precautions: Fall Precaution Comments: wears Unna boot when ambulating Restrictions Weight Bearing Restrictions: No LLE Weight Bearing: Weight bearing as tolerated  Pain: Pain Assessment Pain Assessment: No/denies pain  See FIM for current functional status  Therapy/Group: Individual Therapy  Roseanna Koplin 11/17/2013, 11:25 AM

## 2013-11-17 NOTE — Consult Note (Addendum)
WOC follow-up: Refer to ortho consult for plan of care.   Dr Lajoyce Corners has ordered Neomia Dear boots which will be changed weekly on Fridays by the ortho tech. Please contact to Dr Lajoyce Corners for any questions regarding LLE topical treatment and re-consult if further assistance is needed.   Thank-you,  Cammie Mcgee MSN, RN, CWOCN, [place], CNS [phone]

## 2013-11-17 NOTE — Progress Notes (Signed)
Coude catheter inserted at 1930 with yellow, clear urine return. Will continue to monitor.

## 2013-11-17 NOTE — Progress Notes (Signed)
Physical Therapy Session Note  Patient Details  Name: [name] MRN: [id number] Date of Birth: 1933/08/03  Today's Date: 11/17/2013 Time: 0800-0900 Time Calculation (min): 60 min  Short Term Goals: Week 1:  PT Short Term Goal 1 (Week 1): STGs=LTGs due to ELOS  Skilled Therapeutic Interventions/Progress Updates:    Session focused on functional endurance, standing balance, bed mobility. Received in bed, agreeable to participate in therapy. ModA to move supine>sit 2/2 decreased trunk control, TotalA to don walking boot on L and sock/shoe on R foot. SPT w/ RW w/ MinA to R bed>w/c. Pt propelled w/c 150' to rehab gym. Pt w/ multiple bouts of ambulation w/ RW 50'-100' at a time with rest breaks in between, MinA for safety. Ambulated around cones in hospital hallway for obstacle negotiation, MinA w/ RW. Side stepping at [facility] for high level gait. Pt w/ x1 instance of oxygen saturation dropping to 85 w/ activity, recovered to >90 within 30 seconds. Propelled w/c back to room w/ S. Pt left in room in care of RN.  Therapy Documentation Precautions:  Precautions Precautions: Fall Precaution Comments: wears Radio broadcast assistant when ambulating Restrictions Weight Bearing Restrictions: No LLE Weight Bearing: Weight bearing as tolerated Pain: Pain Assessment Pain Assessment: No/denies pain Pain Score: 0-No pain Locomotion : Ambulation Ambulation/Gait Assistance: 4: Min guard   See FIM for current functional status  Therapy/Group: Individual Therapy  Hosie Spangle Hosie Spangle, PT, DPT 11/17/2013, 12:09 PM

## 2013-11-17 NOTE — Progress Notes (Signed)
Pt has had minimal urine output with urinal with pt standing and penis continues to dribble urine.  Scanned bladder throughout shift with pt requiring coude I&O cath. With each cath, pt had minimal output from cath resulting of scan post cath residual of >400cc each time.  Notified Marissa Nestle, PA with findings. Towels in place to elevate swollen scrotum and ice packs used when needed. No new orders at this time. Will continue plan of care.

## 2013-11-17 NOTE — Progress Notes (Signed)
Occupational Therapy Session Note  Patient Details  Name: [name] MRN: [id number] Date of Birth: 04-Jan-1934  Today's Date: 11/17/2013 OT Individual Time: 3235-5732 OT Individual Time Calculation (min): 60 min   Short Term Goals: Week 1:  OT Short Term Goal 1 (Week 1): STG = LTGs due to ELOS  Skilled Therapeutic Interventions/Progress Updates:  Patient resting in bed upon arrival.  Engaged in bed mobility, grooming, toilet transfers and simple HM tasks,  Focused session on activity tolerance, standing tolerance, dynamic balance, walker safety and toilet transfer.  Patient reports feeling dizzy when transitioning from right sidelying to sit EOB yet was able to recover within 30-40 seconds.  Patient ambulated to the sink to perform 2 grooming tasks while standing then rested in straight back chair.  Patient stood at chest of drawers to complete unpacking of his clothes from home while practicing balance and walker safety then sat back down to rest.  Patient them ambulated with RW to bathroom for toilet transfer with 3 in 1 over commode after adjusted down one notch.  Patient felt like seat too high therefore, removed 3 in 1 completely and patient able to perform sit><stand using his knees to assist him and he was able to sit on commode more comfortably given the edema in his scrotum.  Patient stood 2 times to attempt to urinate, once with urinal and once while standing at the toilet.  Patient reports feeling the urge to urinate however only a few drops each time.  RN aware and patient assist back to bed.  Therapy Documentation Precautions:  Precautions Precautions: Fall Precaution Comments: wears Unna boot when ambulating Restrictions Weight Bearing Restrictions: No LLE Weight Bearing: Weight bearing as tolerated Pain: Denies pain See FIM for current functional status  Therapy/Group: Individual Therapy  Eliza Grissinger 11/17/2013, 4:22 PM
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
## 2013-11-17 NOTE — Progress Notes (Signed)
Orthopedic Tech Progress Note Patient Details:  [name] 03-25-1934 [id number]  Ortho Devices Type of Ortho Device: Radio broadcast assistant Ortho Device/Splint Location: LLE Ortho Device/Splint Interventions: Ordered;Application   Jennye Moccasin 11/17/2013, 7:08 PM

## 2013-11-18 ENCOUNTER — Inpatient Hospital Stay (HOSPITAL_COMMUNITY): Payer: Medicare Other

## 2013-11-18 LAB — URINE CULTURE
Colony Count: NO GROWTH
Culture: NO GROWTH

## 2013-11-18 LAB — PROTIME-INR
INR: 2.3 — ABNORMAL HIGH (ref 0.00–1.49)
Prothrombin Time: 25.3 seconds — ABNORMAL HIGH (ref 11.6–15.2)

## 2013-11-18 LAB — GLUCOSE, CAPILLARY
GLUCOSE-CAPILLARY: 117 mg/dL — AB (ref 70–99)
GLUCOSE-CAPILLARY: 139 mg/dL — AB (ref 70–99)
Glucose-Capillary: 119 mg/dL — ABNORMAL HIGH (ref 70–99)

## 2013-11-18 MED ORDER — WARFARIN SODIUM 1 MG PO TABS
1.5000 mg | ORAL_TABLET | Freq: Once | ORAL | Status: AC
  Administered 2013-11-18: 1.5 mg via ORAL
  Filled 2013-11-18 (×2): qty 1

## 2013-11-18 NOTE — Progress Notes (Signed)
Patient information reviewed and entered into [facility] System by Becky Rodriques Badie, covering PPS coordinator. Information including medical coding and functional independence measure will be reviewed and updated through discharge.  Per nursing, patient was given "Data Collection Information Summary for Patients in Inpatient Rehabilitation Facilities with attached Privacy Act Statement Health Care Records" upon admission.     

## 2013-11-18 NOTE — Progress Notes (Signed)
80 y.o. right-handed male with history of previous CVA with little residual, hypertension, diabetes mellitus peripheral neuropathy with chronic nonhealing foot ulcers, chronic renal insufficiency with baseline creatinine 1.56 as well as pericarditis associated with pericardial effusion which was identified during workup of acute CVA. Patient used a walker prior to admission. Wife also with history of CVA and she also uses a walker. Presented 11/02/2013 with chest tightness and palpitations. Echocardiogram with ejection fraction of 50% large pericardial effusion identified features consistent with a moderate tamponade as well as incidental findings of LAA thrombus. Underwent percutaneous pericardiocentesis 11/03/2013 per Dr. [place] with removal proximally 750 mL of fluid. Followup echocardiogram showed resolution of pericardial effusion with echocardiogram again later completed showing some reaccumulation of fluid  Subjective/Complaints: Difficulty all throughout the day yesterday with voiding/IC's. Decided late to put foley in---had uneventful night---coude cath was passed.  Objective: Vital Signs: Blood pressure 149/47, pulse 55, temperature 98 F (36.7 C), temperature source Oral, resp. rate 18, height 5\' 7"  (1.702 m), weight 84.8 kg (186 lb 15.2 oz), SpO2 100.00%. No results found. Results for orders placed during the hospital encounter of 11/13/13 (from the past 72 hour(s))  GLUCOSE, CAPILLARY     Status: Abnormal   Collection Time    11/15/13 11:27 AM      Result Value Ref Range   Glucose-Capillary 114 (*) 70 - 99 mg/dL   Comment 1 Notify RN    GLUCOSE, CAPILLARY     Status: Abnormal   Collection Time    11/15/13  4:14 PM      Result Value Ref Range   Glucose-Capillary 136 (*) 70 - 99 mg/dL   Comment 1 Notify RN    GLUCOSE, CAPILLARY     Status: Abnormal   Collection Time    11/15/13  9:36 PM      Result Value Ref Range   Glucose-Capillary 125 (*) 70 - 99 mg/dL  PROTIME-INR      Status: Abnormal   Collection Time    11/16/13  5:55 AM      Result Value Ref Range   Prothrombin Time 32.4 (*) 11.6 - 15.2 seconds   INR 3.16 (*) 0.00 - 1.49  GLUCOSE, CAPILLARY     Status: Abnormal   Collection Time    11/16/13  7:21 AM      Result Value Ref Range   Glucose-Capillary 111 (*) 70 - 99 mg/dL   Comment 1 Notify RN    GLUCOSE, CAPILLARY     Status: Abnormal   Collection Time    11/16/13 11:18 AM      Result Value Ref Range   Glucose-Capillary 116 (*) 70 - 99 mg/dL   Comment 1 Notify RN    GLUCOSE, CAPILLARY     Status: Abnormal   Collection Time    11/16/13  4:34 PM      Result Value Ref Range   Glucose-Capillary 148 (*) 70 - 99 mg/dL   Comment 1 Notify RN    GLUCOSE, CAPILLARY     Status: Abnormal   Collection Time    11/16/13  8:27 PM      Result Value Ref Range   Glucose-Capillary 159 (*) 70 - 99 mg/dL  PROTIME-INR     Status: Abnormal   Collection Time    11/17/13  5:00 AM      Result Value Ref Range   Prothrombin Time 28.7 (*) 11.6 - 15.2 seconds   INR 2.70 (*) 0.00 - 1.49  GLUCOSE, CAPILLARY  Status: None   Collection Time    11/17/13  7:23 AM      Result Value Ref Range   Glucose-Capillary 99  70 - 99 mg/dL   Comment 1 Notify RN    URINALYSIS, ROUTINE W REFLEX MICROSCOPIC     Status: None   Collection Time    11/17/13  9:18 AM      Result Value Ref Range   Color, Urine YELLOW  YELLOW   APPearance CLEAR  CLEAR   Specific Gravity, Urine 1.015  1.005 - 1.030   pH 5.0  5.0 - 8.0   Glucose, UA NEGATIVE  NEGATIVE mg/dL   Hgb urine dipstick NEGATIVE  NEGATIVE   Bilirubin Urine NEGATIVE  NEGATIVE   Ketones, ur NEGATIVE  NEGATIVE mg/dL   Protein, ur NEGATIVE  NEGATIVE mg/dL   Urobilinogen, UA 0.2  0.0 - 1.0 mg/dL   Nitrite NEGATIVE  NEGATIVE   Leukocytes, UA NEGATIVE  NEGATIVE   Comment: MICROSCOPIC NOT DONE ON URINES WITH NEGATIVE PROTEIN, BLOOD, LEUKOCYTES, NITRITE, OR GLUCOSE <1000 mg/dL.  GLUCOSE, CAPILLARY     Status: Abnormal    Collection Time    11/17/13 11:06 AM      Result Value Ref Range   Glucose-Capillary 114 (*) 70 - 99 mg/dL   Comment 1 Notify RN    GLUCOSE, CAPILLARY     Status: Abnormal   Collection Time    11/17/13  4:23 PM      Result Value Ref Range   Glucose-Capillary 155 (*) 70 - 99 mg/dL   Comment 1 Notify RN    GLUCOSE, CAPILLARY     Status: Abnormal   Collection Time    11/17/13  9:44 PM      Result Value Ref Range   Glucose-Capillary 168 (*) 70 - 99 mg/dL  PROTIME-INR     Status: Abnormal   Collection Time    11/18/13  6:42 AM      Result Value Ref Range   Prothrombin Time 25.3 (*) 11.6 - 15.2 seconds   INR 2.30 (*) 0.00 - 1.49  GLUCOSE, CAPILLARY     Status: Abnormal   Collection Time    11/18/13  6:51 AM      Result Value Ref Range   Glucose-Capillary 119 (*) 70 - 99 mg/dL     HEENT: normal Cardio: RRR Resp: CTA B/L GI: BS positive and NT, ND Extremity:  Edema R pre tib, LLE in UNNA boot Skin:   Intact and Wound UNNA boot , new no soak through, + foul odor continues, area non-tender Neuro: Alert/Oriented and Abnormal Motor 3-/5 Left HF, KE, Ankle NT secondary to UNNA, 4/5 BUE andRLE Musc/Skel:  Other No calf tenderness GEN NAD GU: scrotum swollen to size of grapefruit, nontender, minimally red---unchanged, penis minimally visible  Assessment/Plan: 1. Functional deficits secondary to deconditioning secondary to pericardial effusion which require 3+ hours per day of interdisciplinary therapy in a comprehensive inpatient rehab setting. Physiatrist is providing close team supervision and 24 hour management of active medical problems listed below. Physiatrist and rehab team continue to assess barriers to discharge/monitor patient progress toward functional and medical goals. FIM: FIM - Bathing Bathing Steps Patient Completed: Chest;Right Arm;Left Arm;Abdomen;Front perineal area;Right upper leg;Left upper leg Bathing: 3: Mod-Patient completes 5-7 1626f 10 parts or 50-74%  FIM -  Upper Body Dressing/Undressing Upper body dressing/undressing: 0: Wears gown/pajamas-no public clothing FIM - Lower Body Dressing/Undressing Lower body dressing/undressing steps patient completed: Fasten/unfasten left shoe;Fasten/unfasten right shoe;Don/Doff right shoe;Don/Doff  left shoe Lower body dressing/undressing: 0: Wears gown/pajamas-no public clothing  FIM - Toileting Toileting steps completed by patient: Performs perineal hygiene Toileting: 2: Max-Patient completed 1 of 3 steps  FIM - Diplomatic Services operational officer Devices: Grab bars;Walker Toilet Transfers: 4-To toilet/BSC: Min A (steadying Pt. > 75%);4-From toilet/BSC: Min A (steadying Pt. > 75%)  FIM - Banker Devices: Walker;Bed rails Bed/Chair Transfer: 3: Supine > Sit: Mod A (lifting assist/Pt. 50-74%/lift 2 legs;4: Bed > Chair or W/C: Min A (steadying Pt. > 75%);4: Chair or W/C > Bed: Min A (steadying Pt. > 75%)  FIM - Locomotion: Wheelchair Distance: 150 Locomotion: Wheelchair: 5: Travels 150 ft or more: maneuvers on rugs and over door sills with supervision, cueing or coaxing FIM - Locomotion: Ambulation Locomotion: Ambulation Assistive Devices: Designer, industrial/product Ambulation/Gait Assistance: 4: Min guard Locomotion: Ambulation: 2: Travels 50 - 149 ft with minimal assistance (Pt.>75%)  Comprehension Comprehension Mode: Auditory Comprehension: 5-Follows basic conversation/direction: With extra time/assistive device  Expression Expression Mode: Verbal Expression: 5-Expresses basic needs/ideas: With extra time/assistive device  Social Interaction Social Interaction: 5-Interacts appropriately 90% of the time - Needs monitoring or encouragement for participation or interaction.  Problem Solving Problem Solving: 5-Solves basic problems: With no assist  Memory Memory: 5-Recognizes or recalls 90% of the time/requires cueing < 10% of the time  Medical Problem List  and Plan:   1. Functional deficits secondary to deconditioning after pericardial effusion status post subxiphoid pericardial window 11/09/2013   2. DVT Prophylaxis/Anticoagulation: Initiation of Coumadin for LAAthrombus as well intermittent atrial fibrillation. Monitor for any bleeding episodes   3. Pain Management: Oxycodone as needed. Monitor with increased mobility   4. acute blood loss anemia. Continue iron supplement. Followup CBC   5. Neuropsych: This patient is capable of making decisions on his own behalf.   6. Skin/Wound Care/chronic diabetic left foot ulcer.: Followup wound care nurse. Continue Radio broadcast assistant. Doxycycline for wound coverage   -continues to have odor 7. Diabetes mellitus with peripheral neuropathy. Latest hemoglobin A1c 5.9. Check blood sugars a.c. and at bedtime. Continue sliding scale insulin   8. Hypertension/atrial fibrillation. Lopressor 12.5 mg twice a day, Norvasc 5 mg daily, amiodarone 400 mg daily. Cardiac rate control. Monitor with increased mobility   9. Chronic renal insufficiency. Baseline creatinine 1.56. Followup chemistries   10. Hyperlipidemia. Zocor   11. Urinary frequency: re-check urine unremarkable thus far/cx pending  -rx scrotal edema with ice/elevation  -foley cath for now  -pt appears comfortable    LOS (Days) 5 A FACE TO FACE EVALUATION WAS PERFORMED  SWARTZ,ZACHARY T 11/18/2013, 9:25 AM

## 2013-11-18 NOTE — Progress Notes (Signed)
Occupational Therapy Session Note  Patient Details  Name: [name] MRN: [id number] Date of Birth: 1934/03/28  Today's Date: 11/18/2013 OT Individual Time: 5909-3112 OT Individual Time Calculation (min): 60 min   Short Term Goals: Week 1:  OT Short Term Goal 1 (Week 1): STG = LTGs due to ELOS  Skilled Therapeutic Interventions/Progress Updates: ADL-retraining with focus on pt ed on compensatory strategies for completion of ADL with scrotal edema.   Pt able to perform bed mobility and transfer to RW supervised (without physical assist) using bed rails and extra time to ambulate to bathroom to complete toileting and shower-level bathing sitting/standing using shower chair and grab bars.   Pt completed small BM but was unable to perform thorough hygiene this session due to impaired reach.   Pt completed shower level bath, using grab bar to stand to allow assist with washing buttocks.  Pt is currently requires min assist for bathing and mod assist to don clothing while sitting in w/c and standing supported at sink.   Pt left in w/c at end of session with call light within reach, awaiting RN tech support for supplemental beverage.    Therapy Documentation Precautions:  Precautions Precautions: Fall Precaution Comments: wears Unna boot when ambulating Restrictions Weight Bearing Restrictions: No LLE Weight Bearing: Weight bearing as tolerated   Pain: Pain Assessment Pain Score: 0-No pain  See FIM for current functional status  Therapy/Group: Individual Therapy  Kidada Ging 11/18/2013, 12:41 PM

## 2013-11-18 NOTE — Progress Notes (Signed)
ANTICOAGULATION CONSULT NOTE - Follow Up Consult  Pharmacy Consult for coumadin Indication: afib and atrial thrombus  No Known Allergies  Patient Measurements: Height: 5\' 7"  (170.2 cm) Weight: 186 lb 15.2 oz (84.8 kg) IBW/kg (Calculated) : 66.1 Heparin Dosing Weight:   Vital Signs: Temp: 98 F (36.7 C) (08/18 0616) Temp src: Oral (08/18 0616) BP: 149/47 mmHg (08/18 0616) Pulse Rate: 55 (08/18 0616)  Labs:  Recent Labs  11/16/13 0555 11/17/13 0500 11/18/13 0642  LABPROT 32.4* 28.7* 25.3*  INR 3.16* 2.70* 2.30*    Estimated Creatinine Clearance: 35.6 ml/min (by C-G formula based on Cr of 1.78).   Medications:  Scheduled:  . amiodarone  400 mg Oral Daily  . amLODipine  2.5 mg Oral Daily  . aspirin EC  81 mg Oral Daily  . chlorthalidone  25 mg Oral Daily  . doxycycline  100 mg Oral Q12H  . feeding supplement (GLUCERNA SHAKE)  237 mL Oral BID BM  . ferrous sulfate  325 mg Oral TID WC  . insulin aspart  0-5 Units Subcutaneous QHS  . metoprolol tartrate  12.5 mg Oral BID  . senna-docusate  1 tablet Oral BID  . simvastatin  10 mg Oral q1800  . Warfarin - Pharmacist Dosing Inpatient   Does not apply q1800   Infusions:    Assessment: 80 yo male with afib and atrial thrombus is currently on therapeutic coumadin.  INR is down a little to 2.3.  No doses on 08/15 and 08/16.    Goal of Therapy:  INR 2-3 Monitor platelets by anticoagulation protocol: Yes   Plan:  - coumadin 1.5mg  po x - INR in am  Tinie Mcgloin, Tsz-Yin 11/18/2013,8:28 AM

## 2013-11-18 NOTE — Progress Notes (Signed)
Physical Therapy Session Note  Patient Details  Name: [name] MRN: [id number] Date of Birth: Dec 07, 1933  Today's Date: 11/18/2013 PT Individual Time: 1101-1201 PT Individual Time Calculation (min): 60 min  Session 2 Time: 1445-1530 Time Calculation (min): 45 min   Short Term Goals: Week 1:  PT Short Term Goal 1 (Week 1): STGs=LTGs due to ELOS  Skilled Therapeutic Interventions/Progress Updates:    Session 1: Session focused on functional endurance, ambulation, stair negotiation, w/c propulsion. Pt received supine in bed, agreeable to participate in therapy. Pt reported need for bathroom prior to starting therapy. Pt ambulated w/ RW 10' to bathroom w/ MinGuard-close S and urinated while seated on toilet. Pt utilized grab bars to transfer on/off toilet. Pt propelled w/c 150' w/ S and MaxA to manage leg rests/brakes. Pt required 2 rest breaks during w/c propulsion 2/2 decreased activity tolerance. Pt ambulated 48' w/ RW w/ MinGuard. Negotiated up/down 3 stairs w/ B rails w/ MinA for safety. Ambulated 58' w/ RW w/ MinGuard. Pt required extended rest breaks between each activity due to decreased activity tolerance. During last bout of ambulation, pt required wheelchair to be brought to him as he was unable to finish walking 75' back to chair. Pt propelled w/c back to room w/ S and 2 rest breaks. Pt ambulated 5' w/ RW to hospital bed, moved sit>supine w/ MinA for managing LLE. Pt left supine in bed w/ ice pack applied to scrotum w/ all needs within reach.  Session 2: Pt received supine in bed, agreeable to participate in therapy after using bathroom. Pt moved supine>sit w/ MinA ambulated 10' w/ RW to toilet w/ MinGuard w/ use of grab bars to sit<>stand from toilet. Pt produced moderate bowel movement and required TotalA to perform perineal care. Pt w/ several more ambulations around room w/ RW all w/ minGuard. After BM and clean up pt reported increased HA and dizziness, vital signs stable. Pt left  supine in bed. RN made aware of pt's complaints.  Therapy Documentation Precautions:  Precautions Precautions: Fall Precaution Comments: wears Radio broadcast assistant when ambulating Restrictions Weight Bearing Restrictions: No LLE Weight Bearing: Weight bearing as tolerated Vital Signs: Therapy Vitals Temp: 98 F (36.7 C) Temp src: Oral Pulse Rate: 55 Resp: 18 BP: 149/47 mmHg Patient Position (if appropriate): Lying Oxygen Therapy SpO2: 100 % O2 Device: None (Room air)  See FIM for current functional status  Therapy/Group: Individual Therapy  Hosie Spangle Hosie Spangle, PT, DPT 11/18/2013, 7:52 AM

## 2013-11-19 ENCOUNTER — Inpatient Hospital Stay (HOSPITAL_COMMUNITY): Payer: Medicare Other

## 2013-11-19 ENCOUNTER — Inpatient Hospital Stay (HOSPITAL_COMMUNITY): Payer: Medicare Other | Admitting: Physical Therapy

## 2013-11-19 ENCOUNTER — Inpatient Hospital Stay (HOSPITAL_COMMUNITY): Payer: Medicare Other | Admitting: Occupational Therapy

## 2013-11-19 ENCOUNTER — Encounter (HOSPITAL_COMMUNITY): Payer: Medicare Other | Admitting: Occupational Therapy

## 2013-11-19 DIAGNOSIS — Z5189 Encounter for other specified aftercare: Secondary | ICD-10-CM

## 2013-11-19 DIAGNOSIS — N183 Chronic kidney disease, stage 3 unspecified: Secondary | ICD-10-CM

## 2013-11-19 DIAGNOSIS — R5381 Other malaise: Secondary | ICD-10-CM

## 2013-11-19 DIAGNOSIS — D638 Anemia in other chronic diseases classified elsewhere: Secondary | ICD-10-CM

## 2013-11-19 DIAGNOSIS — I4891 Unspecified atrial fibrillation: Secondary | ICD-10-CM

## 2013-11-19 LAB — BLOOD GAS, ARTERIAL
Acid-base deficit: 0 mmol/L (ref 0.0–2.0)
Bicarbonate: 23.3 mEq/L (ref 20.0–24.0)
DRAWN BY: 331471
O2 Saturation: 91.2 %
PATIENT TEMPERATURE: 98.6
PO2 ART: 58.3 mmHg — AB (ref 80.0–100.0)
TCO2: 24.3 mmol/L (ref 0–100)
pCO2 arterial: 32.9 mmHg — ABNORMAL LOW (ref 35.0–45.0)
pH, Arterial: 7.464 — ABNORMAL HIGH (ref 7.350–7.450)

## 2013-11-19 LAB — CBC WITH DIFFERENTIAL/PLATELET
BASOS ABS: 0 10*3/uL (ref 0.0–0.1)
BASOS PCT: 0 % (ref 0–1)
Eosinophils Absolute: 0 10*3/uL (ref 0.0–0.7)
Eosinophils Relative: 0 % (ref 0–5)
HEMATOCRIT: 28.2 % — AB (ref 39.0–52.0)
Hemoglobin: 9.2 g/dL — ABNORMAL LOW (ref 13.0–17.0)
Lymphocytes Relative: 5 % — ABNORMAL LOW (ref 12–46)
Lymphs Abs: 0.7 10*3/uL (ref 0.7–4.0)
MCH: 28.1 pg (ref 26.0–34.0)
MCHC: 32.6 g/dL (ref 30.0–36.0)
MCV: 86.2 fL (ref 78.0–100.0)
MONO ABS: 0.5 10*3/uL (ref 0.1–1.0)
Monocytes Relative: 4 % (ref 3–12)
Neutro Abs: 11.3 10*3/uL — ABNORMAL HIGH (ref 1.7–7.7)
Neutrophils Relative %: 91 % — ABNORMAL HIGH (ref 43–77)
Platelets: 214 10*3/uL (ref 150–400)
RBC: 3.27 MIL/uL — ABNORMAL LOW (ref 4.22–5.81)
RDW: 16.3 % — AB (ref 11.5–15.5)
WBC: 12.5 10*3/uL — ABNORMAL HIGH (ref 4.0–10.5)

## 2013-11-19 LAB — BASIC METABOLIC PANEL
Anion gap: 13 (ref 5–15)
BUN: 41 mg/dL — ABNORMAL HIGH (ref 6–23)
CO2: 22 meq/L (ref 19–32)
CREATININE: 1.7 mg/dL — AB (ref 0.50–1.35)
Calcium: 8.9 mg/dL (ref 8.4–10.5)
Chloride: 96 mEq/L (ref 96–112)
GFR calc non Af Amer: 37 mL/min — ABNORMAL LOW (ref >90)
GFR, EST AFRICAN AMERICAN: 43 mL/min — AB (ref >90)
Glucose, Bld: 139 mg/dL — ABNORMAL HIGH (ref 70–99)
Potassium: 5 mEq/L (ref 3.7–5.3)
Sodium: 131 mEq/L — ABNORMAL LOW (ref 137–147)

## 2013-11-19 LAB — GLUCOSE, CAPILLARY
GLUCOSE-CAPILLARY: 142 mg/dL — AB (ref 70–99)
Glucose-Capillary: 135 mg/dL — ABNORMAL HIGH (ref 70–99)
Glucose-Capillary: 130 mg/dL — ABNORMAL HIGH (ref 70–99)
Glucose-Capillary: 119 mg/dL — ABNORMAL HIGH (ref 70–99)
Glucose-Capillary: 145 mg/dL — ABNORMAL HIGH (ref 70–99)

## 2013-11-19 LAB — TROPONIN I

## 2013-11-19 LAB — PROTIME-INR
INR: 2.13 — ABNORMAL HIGH (ref 0.00–1.49)
PROTHROMBIN TIME: 23.8 s — AB (ref 11.6–15.2)

## 2013-11-19 MED ORDER — WARFARIN SODIUM 2 MG PO TABS
2.0000 mg | ORAL_TABLET | Freq: Once | ORAL | Status: AC
  Administered 2013-11-19: 2 mg via ORAL
  Filled 2013-11-19: qty 1

## 2013-11-19 MED ORDER — ALBUTEROL SULFATE (2.5 MG/3ML) 0.083% IN NEBU: 2.5000 mg | INHALATION_SOLUTION | RESPIRATORY_TRACT | Status: DC | PRN

## 2013-11-19 MED ORDER — ALBUTEROL SULFATE (2.5 MG/3ML) 0.083% IN NEBU
2.5000 mg | INHALATION_SOLUTION | RESPIRATORY_TRACT | Status: DC
  Administered 2013-11-19: 2.5 mg via RESPIRATORY_TRACT

## 2013-11-19 MED ORDER — FUROSEMIDE 10 MG/ML IJ SOLN
20.0000 mg | Freq: Once | INTRAMUSCULAR | Status: AC
  Administered 2013-11-19: 20 mg via INTRAVENOUS
  Filled 2013-11-19: qty 2

## 2013-11-19 MED ORDER — ALBUTEROL SULFATE (2.5 MG/3ML) 0.083% IN NEBU
INHALATION_SOLUTION | RESPIRATORY_TRACT | Status: AC
Start: 2013-11-19 — End: 2013-11-20
  Filled 2013-11-19: qty 3

## 2013-11-19 NOTE — Progress Notes (Signed)
Physical Therapy Session Note  Patient Details  Name: [name] MRN: [id number] Date of Birth: 1934-03-16  Today's Date: 11/19/2013 PT Individual Time: 1300-1400 PT Individual Time Calculation (min): 60 min   Short Term Goals: Week 1:  PT Short Term Goal 1 (Week 1): STGs=LTGs due to ELOS  Skilled Therapeutic Interventions/Progress Updates:    Therapeutic Activity: PT instructs pt in supine to sit transfer without rail req verbal cues for technique and min A. PT instructs pt in bed to w/c transfer req SBA and verbal cues for safe hand placment with RW.   Gait Training: PT instructs pt in ambulation 100' + 150' + 100' req CGA to hold briefs up with RW and verbal cues for increasing distance, as he is able. Pt desaturates to 90% on RA with prolonged ambulation, but recovers to >= 92% in less than 30 seconds of rest.  PT instructs pt in ascending/descending 3 stairs with 1 rail (R) req min A on ascent/descent for safety and verbal cues for technique. PT instructs pt to repeat the stairs, but this time to use L rail on ascent and R rail on descent, but pt req 2 rail support on ascent and 1 rail support on descent req min A each direction.  W/C management: PT instructs pt in w/c management with B UE/LE prn x 75' x 3 reps. Pt rests due to fatigue. SaO2 decreases to 94% on RA, but pt recovers with a few minutes of rest.    Pt is progressing with endurance, but continues to have balance deficits in standing, limited activity tolerance, and difficulty on stairs. Pt will benefit from trying to do stairs with 2 hands on 1 rail, as he is unable to reach both of his handrails when doing stairs to get into his house. PT spoke with CNA and instructed CNA to elevate pt's scrotum due to significant edema. PT suspects pt is suffering from lymphedema and certified lymphedema therapist (OT) plans on seeing pt tomorrow to perform manual lymphatic drainage to groin/LEs.    Therapy Documentation Precautions:   Precautions Precautions: Fall Precaution Comments: wears Radio broadcast assistant when ambulating Restrictions Weight Bearing Restrictions: No LLE Weight Bearing: Weight bearing as tolerated    Vital Signs: Therapy Vitals Pulse Rate: 83 BP: 157/59 mmHg Patient Position (if appropriate): Sitting Oxygen Therapy SpO2: 100 % O2 Device: None (Room air) Pain: Pain Assessment Pain Assessment: 0-10 Pain Score: 5  Faces Pain Scale: Hurts a little bit Pain Type: Surgical pain Pain Location: Sternum Pain Orientation: Mid Pain Descriptors / Indicators: Aching Pain Onset: On-going Pain Intervention(s): Repositioned;Rest Multiple Pain Sites: No  See FIM for current functional status  Therapy/Group: Individual Therapy  Jhamir Pickup M 11/19/2013, 1:14 PM

## 2013-11-19 NOTE — Progress Notes (Signed)
VS stable, pt resting with 2L O2 Nasal cannula. Pt receiving 4mg  Zofran IV 1838. Will continue to monitor pt.

## 2013-11-19 NOTE — Progress Notes (Signed)
ANTICOAGULATION CONSULT NOTE - Follow Up Consult  Pharmacy Consult for coumadin Indication: afib and left atrial thrombus  No Known Allergies  Patient Measurements: Height: 5\' 7"  (170.2 cm) Weight: 186 lb 15.2 oz (84.8 kg) IBW/kg (Calculated) : 66.1 Heparin Dosing Weight:   Vital Signs: Temp: 98 F (36.7 C) (08/19 0606) Temp src: Oral (08/19 0606) BP: 144/46 mmHg (08/19 0606) Pulse Rate: 44 (08/19 0606)  Labs:  Recent Labs  11/17/13 0500 11/18/13 0642 11/19/13 0448  LABPROT 28.7* 25.3* 23.8*  INR 2.70* 2.30* 2.13*    Estimated Creatinine Clearance: 35.6 ml/min (by C-G formula based on Cr of 1.78).   Medications:  Scheduled:  . amiodarone  400 mg Oral Daily  . amLODipine  2.5 mg Oral Daily  . aspirin EC  81 mg Oral Daily  . chlorthalidone  25 mg Oral Daily  . doxycycline  100 mg Oral Q12H  . feeding supplement (GLUCERNA SHAKE)  237 mL Oral BID BM  . ferrous sulfate  325 mg Oral TID WC  . insulin aspart  0-5 Units Subcutaneous QHS  . metoprolol tartrate  12.5 mg Oral BID  . senna-docusate  1 tablet Oral BID  . simvastatin  10 mg Oral q1800  . Warfarin - Pharmacist Dosing Inpatient   Does not apply q1800   Infusions:    Assessment: 80 yo male with afib and left atrial thrombus is currently on therapeutic coumadin.  INR today is 2.13 (down a little). Patient is also on doxycyline.  Goal of Therapy:  INR 2-3 Monitor platelets by anticoagulation protocol: Yes   Plan:  1. Coumadin 2 mg po x 1 2. Daily INR 3. Follow up LOT for doxycycline as this is affecting INR   Helma Argyle, Tsz-Yin 11/19/2013,8:27 AM

## 2013-11-19 NOTE — Progress Notes (Signed)
80 y.o. right-handed male with history of previous CVA with little residual, hypertension, diabetes mellitus peripheral neuropathy with chronic nonhealing foot ulcers, chronic renal insufficiency with baseline creatinine 1.56 as well as pericarditis associated with pericardial effusion which was identified during workup of acute CVA. Patient used a walker prior to admission. Wife also with history of CVA and she also uses a walker. Presented 11/02/2013 with chest tightness and palpitations. Echocardiogram with ejection fraction of 50% large pericardial effusion identified features consistent with a moderate tamponade as well as incidental findings of LAA thrombus. Underwent percutaneous pericardiocentesis 11/03/2013 per Dr. [place] with removal proximally 750 mL of fluid. Followup echocardiogram showed resolution of pericardial effusion with echocardiogram again later completed showing some reaccumulation of fluid  Subjective/Complaints: No new issues today. Slept fairly well.   Objective: Vital Signs: Blood pressure 144/46, pulse 44, temperature 98 F (36.7 C), temperature source Oral, resp. rate 18, height 5\' 7"  (1.702 m), weight 84.8 kg (186 lb 15.2 oz), SpO2 98.00%. No results found. Results for orders placed during the hospital encounter of 11/13/13 (from the past 72 hour(s))  GLUCOSE, CAPILLARY     Status: Abnormal   Collection Time    11/16/13 11:18 AM      Result Value Ref Range   Glucose-Capillary 116 (*) 70 - 99 mg/dL   Comment 1 Notify RN    GLUCOSE, CAPILLARY     Status: Abnormal   Collection Time    11/16/13  4:34 PM      Result Value Ref Range   Glucose-Capillary 148 (*) 70 - 99 mg/dL   Comment 1 Notify RN    GLUCOSE, CAPILLARY     Status: Abnormal   Collection Time    11/16/13  8:27 PM      Result Value Ref Range   Glucose-Capillary 159 (*) 70 - 99 mg/dL  PROTIME-INR     Status: Abnormal   Collection Time    11/17/13  5:00 AM      Result Value Ref Range   Prothrombin  Time 28.7 (*) 11.6 - 15.2 seconds   INR 2.70 (*) 0.00 - 1.49  GLUCOSE, CAPILLARY     Status: None   Collection Time    11/17/13  7:23 AM      Result Value Ref Range   Glucose-Capillary 99  70 - 99 mg/dL   Comment 1 Notify RN    URINE CULTURE     Status: None   Collection Time    11/17/13  9:18 AM      Result Value Ref Range   Specimen Description URINE, CATHETERIZED     Special Requests NONE     Culture  Setup Time       Value: 11/17/2013 10:24     Performed at [facility]   Colony Count       Value: NO GROWTH     [facility]formed at [facility]   Culture       Value: NO GROWTH     [facility]formed at [facility]   Report Status 11/18/2013 FINAL    URINALYSIS, ROUTINE W REFLEX MICROSCOPIC     Status: None   Collection Time    11/17/13  9:18 AM      Result Value Ref Range   Color, Urine YELLOW  YELLOW   APPearance CLEAR  CLEAR   Specific Gravity, Urine 1.015  1.005 - 1.030   pH 5.0  5.0 - 8.0   Glucose, UA NEGATIVE  NEGATIVE mg/dL  Hgb urine dipstick NEGATIVE  NEGATIVE   Bilirubin Urine NEGATIVE  NEGATIVE   Ketones, ur NEGATIVE  NEGATIVE mg/dL   Protein, ur NEGATIVE  NEGATIVE mg/dL   Urobilinogen, UA 0.2  0.0 - 1.0 mg/dL   Nitrite NEGATIVE  NEGATIVE   Leukocytes, UA NEGATIVE  NEGATIVE   Comment: MICROSCOPIC NOT DONE ON URINES WITH NEGATIVE PROTEIN, BLOOD, LEUKOCYTES, NITRITE, OR GLUCOSE <1000 mg/dL.  GLUCOSE, CAPILLARY     Status: Abnormal   Collection Time    11/17/13 11:06 AM      Result Value Ref Range   Glucose-Capillary 114 (*) 70 - 99 mg/dL   Comment 1 Notify RN    GLUCOSE, CAPILLARY     Status: Abnormal   Collection Time    11/17/13  4:23 PM      Result Value Ref Range   Glucose-Capillary 155 (*) 70 - 99 mg/dL   Comment 1 Notify RN    GLUCOSE, CAPILLARY     Status: Abnormal   Collection Time    11/17/13  9:44 PM      Result Value Ref Range   Glucose-Capillary 168 (*) 70 - 99 mg/dL  PROTIME-INR     Status: Abnormal   Collection Time     11/18/13  6:42 AM      Result Value Ref Range   Prothrombin Time 25.3 (*) 11.6 - 15.2 seconds   INR 2.30 (*) 0.00 - 1.49  GLUCOSE, CAPILLARY     Status: Abnormal   Collection Time    11/18/13  6:51 AM      Result Value Ref Range   Glucose-Capillary 119 (*) 70 - 99 mg/dL  GLUCOSE, CAPILLARY     Status: Abnormal   Collection Time    11/18/13 11:58 AM      Result Value Ref Range   Glucose-Capillary 117 (*) 70 - 99 mg/dL  GLUCOSE, CAPILLARY     Status: Abnormal   Collection Time    11/18/13  4:32 PM      Result Value Ref Range   Glucose-Capillary 139 (*) 70 - 99 mg/dL  GLUCOSE, CAPILLARY     Status: Abnormal   Collection Time    11/18/13  9:27 PM      Result Value Ref Range   Glucose-Capillary 142 (*) 70 - 99 mg/dL  PROTIME-INR     Status: Abnormal   Collection Time    11/19/13  4:48 AM      Result Value Ref Range   Prothrombin Time 23.8 (*) 11.6 - 15.2 seconds   INR 2.13 (*) 0.00 - 1.49  GLUCOSE, CAPILLARY     Status: Abnormal   Collection Time    11/19/13  6:38 AM      Result Value Ref Range   Glucose-Capillary 135 (*) 70 - 99 mg/dL   Comment 1 Notify RN       HEENT: normal Cardio: RRR Resp: CTA B/L GI: BS positive and NT, ND Extremity:  Edema R pre tib, LLE in [employer] boot Skin:   Intact and Wound UNNA boot , new no soak through, + foul odor continues, area non-tender Neuro: Alert/Oriented and Abnormal Motor 3-/5 Left HF, KE, Ankle NT secondary to UNNA, 4/5 BUE andRLE Musc/Skel:  Other No calf tenderness GEN NAD GU: scrotum swollen to size of grapefruit, nontender, minimally red---unchanged, penis minimally visible  Assessment/Plan: 1. Functional deficits secondary to deconditioning secondary to pericardial effusion which require 3+ hours per day of interdisciplinary therapy in a comprehensive inpatient rehab setting.  Physiatrist is providing close team supervision and 24 hour management of active medical problems listed below. Physiatrist and rehab team continue to  assess barriers to discharge/monitor patient progress toward functional and medical goals. FIM: FIM - Bathing Bathing Steps Patient Completed: Chest;Right Arm;Left Arm;Abdomen;Front perineal area;Right upper leg;Left upper leg Bathing: 3: Mod-Patient completes 5-7 51f 10 parts or 50-74%  FIM - Upper Body Dressing/Undressing Upper body dressing/undressing steps patient completed: Thread/unthread right sleeve of pullover shirt/dresss;Thread/unthread left sleeve of pullover shirt/dress;Put head through opening of pull over shirt/dress;Pull shirt over trunk Upper body dressing/undressing: 5: Set-up assist to: Obtain clothing/put away FIM - Lower Body Dressing/Undressing Lower body dressing/undressing steps patient completed: Pull pants up/down Lower body dressing/undressing: 1: Total-Patient completed less than 25% of tasks  FIM - Toileting Toileting steps completed by patient: Adjust clothing prior to toileting Toileting: 1: Total-Patient completed zero steps, helper did all 3  FIM - [employer] Services operational officer Devices: Grab bars;Walker Toilet Transfers: 5-To toilet/BSC: Supervision (verbal cues/safety issues);5-From toilet/BSC: Supervision (verbal cues/safety issues)  FIM - Banker Devices: Walker;Bed rails Bed/Chair Transfer: 4: Supine > Sit: Min A (steadying Pt. > 75%/lift 1 leg);4: Sit > Supine: Min A (steadying pt. > 75%/lift 1 leg);4: Bed > Chair or W/C: Min A (steadying Pt. > 75%);4: Chair or W/C > Bed: Min A (steadying Pt. > 75%)  FIM - Locomotion: Wheelchair Distance: 150 Locomotion: Wheelchair: 2: Travels 50 - 149 ft with supervision, cueing or coaxing FIM - Locomotion: Ambulation Locomotion: Ambulation Assistive Devices: Designer, industrial/product Ambulation/Gait Assistance: 4: Min guard;4: Min assist Locomotion: Ambulation: 2: Travels 50 - 149 ft with minimal assistance (Pt.>75%)  Comprehension Comprehension Mode:  Auditory Comprehension: 5-Follows basic conversation/direction: With extra time/assistive device  Expression Expression Mode: Verbal Expression: 5-Expresses basic 90% of the time/requires cueing < 10% of the time.  Social Interaction Social Interaction: 5-Interacts appropriately 90% of the time - Needs monitoring or encouragement for participation or interaction.  Problem Solving Problem Solving: 5-Solves basic 90% of the time/requires cueing < 10% of the time  Memory Memory: 5-Recognizes or recalls 90% of the time/requires cueing < 10% of the time  Medical Problem List and Plan:   1. Functional deficits secondary to deconditioning after pericardial effusion status post subxiphoid pericardial window 11/09/2013   2. DVT Prophylaxis/Anticoagulation: Initiation of Coumadin for LAAthrombus as well intermittent atrial fibrillation. Monitor for any bleeding episodes   3. Pain Management: Oxycodone as needed. Monitor with increased mobility   4. acute blood loss anemia. Continue iron supplement. Followup CBC   5. Neuropsych: This patient is capable of making decisions on his own behalf.   6. Skin/Wound Care/chronic diabetic left foot ulcer.: Followup wound care nurse. Continue Radio broadcast assistant. Doxycycline for wound coverage   -continues to have odor 7. Diabetes mellitus with peripheral neuropathy. Latest hemoglobin A1c 5.9. Check blood sugars a.c. and at bedtime. Continue sliding scale insulin    -fair control 8. Hypertension/atrial fibrillation. Lopressor 12.5 mg twice a day, Norvasc 5 mg daily, amiodarone 400 mg daily. Cardiac rate control. Monitor with increased mobility   9. Chronic renal insufficiency. Baseline creatinine 1.56.  Follow up tomorrow   10. Hyperlipidemia. Zocor   11. Urinary frequency: re-check urine unremarkable thus far/cx pending  -rx scrotal edema with ice/elevation  -continue foley cath for now  -pt appears comfortable    LOS (Days) 6 A FACE TO FACE EVALUATION WAS  PERFORMED  SWARTZ,ZACHARY T 11/19/2013, 8:21 AM

## 2013-11-19 NOTE — Progress Notes (Signed)
Pt called staff to unit 1530. Pt stated, "Im having an episode." When arrived to room pt was in respiratory distress, diaphoretic and shakey. VS taken, BS checked. VS 151/45 HR 59 98%O2. CBG 118. Manually checked BP 154/48. 1545 rechecked BP 80/48. Pt states "i don't feel normal". Notified Marissa Nestle, PA with pt findings and distress. Pam Love arrived to room as well as Rapid response to assist New orders given. (See prior note Pam Love, PA.) 1745 pt resting in bed, 4L O2 in place for comfort. 20mg  IV lasix X2 doses given. Pt states, "I feel better." BP 158/52 HR 79 O2 99% on 4L. Will continue to monitor pt.

## 2013-11-19 NOTE — Progress Notes (Signed)
Physical Therapy Note  Patient Details  Name: [name] MRN: [id number] Date of Birth: 1934-01-11 Today's Date: 11/19/2013    At scheduled therapy time, pt receiving nursing/medical care stat for cardiac/respiratory workup due to diaphoresis, confusion, edema. PA Pam Love ordered hold for this PM. Will follow up as able. Pt missed 30 minutes of scheduled physical therapy.  Hosie Spangle 11/19/2013, 4:25 PM

## 2013-11-19 NOTE — Progress Notes (Signed)
Social Work Patient ID: [name], male   DOB: Apr 08, 1933, 80 y.o.   MRN: [id number]  Amada Jupiter, LCSW Social Worker Signed  Patient Care Conference Service date: 11/19/2013 10:47 AM  Inpatient RehabilitationTeam Conference and Plan of Care Update Date: 11/18/2013   Time: 2:30 PM     Patient Name: [name]       Medical Record Number: [id number]   Date of Birth: [birth year] Sex: Male         Room/Bed: 4W11C/4W11C-01 Payor Info: Payor: MEDICARE / Plan: MEDICARE PART A AND B / Product Type: *No Product type* /   Admitting Diagnosis: deconditioning sp pericardial effusion no SLP   Admit Date/Time:  11/13/2013  6:51 PM Admission Comments: No comment available   Primary Diagnosis:  <principal problem not specified> Principal Problem: <principal problem not specified>    Patient Active Problem List     Diagnosis  Date Noted   .  Physical deconditioning  11/13/2013   .  Atrial fibrillation with rapid ventricular response  11/05/2013   .  Chest pain  11/02/2013   .  Ulcer of foot, chronic  11/02/2013   .  Thrombocytopenia  11/02/2013   .  Diarrhea  11/02/2013   .  Scrotal edema  11/02/2013   .  PAD (peripheral artery disease)  10/02/2013   .  Pericardial effusion  10/02/2013   .  Decreased cardiac ejection fraction  10/02/2013   .  Drug allergy  10/02/2013   .  CVA (cerebral infarction)  10/01/2013   .  Orthostatic dizziness  09/30/2013   .  Anemia of chronic disease  09/30/2013   .  CKD (chronic kidney disease) stage 3, GFR 30-59 ml/min  08/01/2012   .  Anemia  08/01/2012   .  Leg edema, left  08/01/2012   .  Unresponsive episode  07/26/2012   .  Hypoglycemia  07/26/2012   .  Troponin I above reference range  07/26/2012   .  At high risk for falls  07/26/2012   .  Diabetes mellitus without complication     .  Hypertension       Expected Discharge Date: Expected Discharge Date: 11/25/13  Team Members Present: Physician leading conference: Dr. Faith Rogue Social  Worker Present: Amada Jupiter, LCSW Nurse Present: Phylliss Bob, RN PT Present: Edman Circle, PT;Jess Glenetta Hew, PT OT Present: Donzetta Kohut, OT SLP Present: Feliberto Gottron, SLP PPS Coordinator present : Edson Snowball, PT        Current Status/Progress  Goal  Weekly Team Focus   Medical     deconditioning after multiple medical issues, poorly healing left heel wound, scrotal edema and urine retention  improve activity tolerance with functional activities  wound care, bladder emptying--reducing scrotal edema   Bowel/Bladder     Continent of bowel LBM 11/16/13, coude catheter in place due to urinary retention  To be continent of bladder and remain continent of bowel.  Assess for any s/s of diarrhea/constipation   Swallow/Nutrition/ Hydration            ADL's     Mod Assist for lower body ADL and toileting, supervision for upper body ADL and transfers  Overall Min Assist for ADL  Endurance, transfers training, functional mobility, compensatory training to manage scrotal edema   Mobility     MinA for bed mobility, MinA for short distance ambulation w/ RW  S for short distance ambulation and stairs, mod (I) w/ w/c propulsion  endurance, stairs, upright tolerance,  activity tolerance   Communication            Safety/Cognition/ Behavioral Observations           Pain     n/a  n/a  Monitor any onset of pain q shift.   Skin     Diabetic ulcer to LLE, Bil edema more on left leg., dermabound to mid chest , incision to rt. side with 4 X 4 gauze.  No new skin breakdown/infection  Assess skin q shift.    Rehab Goals Patient on target to meet rehab goals: Yes *See Care Plan and progress notes for long and short-term goals.    Barriers to Discharge:  scrotal edema, NWB thru left heel, stamina      Possible Resolutions to Barriers:    adaptive techniques, stamina training      Discharge Planning/Teaching Needs:    Home with family to provide 24/7 assistance, however, concern that he may require  more assistance than wife/ family can manage.   will schedule prior to d/c    Team Discussion:    Concern with left foot wound.  Replaced foley to allow some relief to scrotal edema.  Balance and strength are good, however, endurance is primary limiting factor.  Concern about caregiver abilities at home - SW to follow up.   Revisions to Treatment Plan:    None    Continued Need for Acute Rehabilitation Level of Care: The patient requires daily medical management by a physician with specialized training in physical medicine and rehabilitation for the following conditions: Daily direction of a multidisciplinary physical rehabilitation program to ensure safe treatment while eliciting the highest outcome that is of practical value to the patient.: Yes Daily medical management of patient stability for increased activity during participation in an intensive rehabilitation regime.: Yes Daily analysis of laboratory values and/or radiology reports with any subsequent need for medication adjustment of medical intervention for : Pulmonary problems;Cardiac problems;Post surgical problems  Breiona Couvillon 11/19/2013, 10:47 AM

## 2013-11-19 NOTE — Progress Notes (Signed)
Patient with presyncopal, diaphoretic episode with attempts at last PT session. SBP in 80's on evaluation and recheck once in bed showed improvement to 150's range. He was reported to be pale, clammy with decreased in responsiveness. EKG, CXR cardiac enzymes ordered.  On exam patient pale, wheezing with pursed lip breathing.  Heart: Regular rate with PVCs Lungs: decreased breath sounds with rales. Abdomen: tight, non tender with +BS. Neuro: Anxious and fearful. Oriented. Followed commands without difficulty.   Ext: RUE with 1+edema distally. LLE with   A/P 1. Fluid overload: IV lasix. Nebulizer treatment administered with improvement of symptoms. Await Xray and labs results.

## 2013-11-19 NOTE — Progress Notes (Signed)
Occupational Therapy Session Note  Patient Details  Name: [name] MRN: [id number] Date of Birth: Mar 20, 1934  Today's Date: 11/19/2013 OT Individual Time: 3295-1884 OT Individual Time Calculation (min): 60 min (45 min=regular session, 15 min=make up minutes)  Short Term Goals: Week 1:  OT Short Term Goal 1 (Week 1): STG = LTGs due to ELOS  Skilled Therapeutic Interventions/Progress Updates:  Patient resting in bed upon arrival with LLE elevated in bed.  Engaged in activity tolerance, bed mobility and grooming at the sink.  Focused session on getting out then back into bed on the same side as he does when he is at home and allowed him to use the headboard to aid in the task secondary to he reports that he has been using a bed post PTA.  Patient also reports that his wife has purchased another bed and he is unsure if he has the post anymore.  We also reviewed the possibility of obtaining a bed rail for the bed or using the armrest of a heavy chair next to the bed.  Patient was supervision for supine>sit using headboard and with sit>supine without use of a devise.  Patient practiced standing tolerance, squats, ambulating in the room and in the hallway with rolling walker and stand at sink for grooming tasks and to practice pursed lip breathing.  Patient required mod cues for correct technique to perform pursed lip breathing.  Once back to bed, LLE and scrotum were elevated and all items within reach.  Therapy Documentation Precautions:  Precautions Precautions: Fall Precaution Comments: wears Unna boot when ambulating Restrictions Weight Bearing Restrictions: No LLE Weight Bearing: Weight bearing as tolerated Pain: No report of pain  Therapy/Group: Individual Therapy  [name] 11/19/2013, 3:09 PM

## 2013-11-19 NOTE — Patient Care Conference (Signed)
Inpatient RehabilitationTeam Conference and Plan of Care Update Date: 11/18/2013   Time: 2:30 PM    Patient Name: Barry LawmanRobert Locey      Medical Record Number: [id number]  Date of Birth: 01/16/1934 Sex: Male         Room/Bed: 4W11C/4W11C-01 Payor Info: Payor: MEDICARE / Plan: MEDICARE PART A AND B / Product Type: *No Product type* /    Admitting Diagnosis: deconditioning sp pericardial effusion no SLP  Admit Date/Time:  11/13/2013  6:51 PM Admission Comments: No comment available   Primary Diagnosis:  <principal problem not specified> Principal Problem: <principal problem not specified>  Patient Active Problem List   Diagnosis Date Noted  . Physical deconditioning 11/13/2013  . Atrial fibrillation with rapid ventricular response 11/05/2013  . Chest pain 11/02/2013  . Ulcer of foot, chronic 11/02/2013  . Thrombocytopenia 11/02/2013  . Diarrhea 11/02/2013  . Scrotal edema 11/02/2013  . PAD (peripheral artery disease) 10/02/2013  . Pericardial effusion 10/02/2013  . Decreased cardiac ejection fraction 10/02/2013  . Drug allergy 10/02/2013  . CVA (cerebral infarction) 10/01/2013  . Orthostatic dizziness 09/30/2013  . Anemia of chronic disease 09/30/2013  . CKD (chronic kidney disease) stage 3, GFR 30-59 ml/min 08/01/2012  . Anemia 08/01/2012  . Leg edema, left 08/01/2012  . Unresponsive episode 07/26/2012  . Hypoglycemia 07/26/2012  . Troponin I above reference range 07/26/2012  . At high risk for falls 07/26/2012  . Diabetes mellitus without complication   . Hypertension     Expected Discharge Date: Expected Discharge Date: 11/25/13  Team Members Present: Physician leading conference: Dr. Faith RogueZachary Swartz Social Worker Present: Amada JupiterLucy Dazhane Villagomez, LCSW Nurse Present: Phylliss BobKom Avegno, RN PT Present: Edman CircleAudra Hall, PT;Jess Glenetta HewGodfrey, PT OT Present: Donzetta KohutFrank Barthold, OT SLP Present: Feliberto Gottronourtney Payne, SLP PPS Coordinator present : Edson SnowballBecky Windsor, PT     Current Status/Progress Goal Weekly Team Focus   Medical   deconditioning after multiple medical issues, poorly healing left heel wound, scrotal edema and urine retention  improve activity tolerance with functional activities  wound care, bladder emptying--reducing scrotal edema   Bowel/Bladder   Continent of bowel LBM 11/16/13, coude catheter in place due to urinary retention  To be continent of bladder and remain continent of bowel.  Assess for any s/s of diarrhea/constipation   Swallow/Nutrition/ Hydration             ADL's   Mod Assist for lower body ADL and toileting, supervision for upper body ADL and transfers  Overall Min Assist for ADL  Endurance, transfers training, functional mobility, compensatory training to manage scrotal edema   Mobility   MinA for bed mobility, MinA for short distance ambulation w/ RW  S for short distance ambulation and stairs, mod (I) w/ w/c propulsion  endurance, stairs, upright tolerance, activity tolerance   Communication             Safety/Cognition/ Behavioral Observations            Pain   n/a  n/a  Monitor any onset of pain q shift.   Skin   Diabetic ulcer to LLE, Bil edema more on left leg., dermabound to mid chest , incision to rt. side with 4 X 4 gauze.  No new skin breakdown/infection  Assess skin q shift.    Rehab Goals Patient on target to meet rehab goals: Yes *See Care Plan and progress notes for long and short-term goals.  Barriers to Discharge: scrotal edema, NWB thru left heel, stamina    Possible Resolutions to  Barriers:  adaptive techniques, stamina training    Discharge Planning/Teaching Needs:  Home with family to provide 24/7 assistance, however, concern that he may require more assistance than wife/ family can manage.  will schedule prior to d/c   Team Discussion:  Concern with left foot wound.  Replaced foley to allow some relief to scrotal edema.  Balance and strength are good, however, endurance is primary limiting factor.  Concern about caregiver abilities at  home - SW to follow up.  Revisions to Treatment Plan:  None   Continued Need for Acute Rehabilitation Level of Care: The patient requires daily medical management by a physician with specialized training in physical medicine and rehabilitation for the following conditions: Daily direction of a multidisciplinary physical rehabilitation program to ensure safe treatment while eliciting the highest outcome that is of practical value to the patient.: Yes Daily medical management of patient stability for increased activity during participation in an intensive rehabilitation regime.: Yes Daily analysis of laboratory values and/or radiology reports with any subsequent need for medication adjustment of medical intervention for : Pulmonary problems;Cardiac problems;Post surgical problems  Kensly Bowmer 11/19/2013, 10:47 AM

## 2013-11-19 NOTE — Progress Notes (Signed)
Occupational Therapy Session Note  Patient Details  Name: Bryndan Lindert MRN: [id number] Date of Birth: 1934-01-21  Today's Date: 11/19/2013 OT Individual Time: 1505-6979 OT Individual Time Calculation (min): 48 min   Short Term Goals: Week 1:  OT Short Term Goal 1 (Week 1): STG = LTGs due to ELOS  Skilled Therapeutic Interventions/Progress Updates:    Pt transferred from supine to sitting EOB with min assist in preparation for sponge bathe at the sink.  Pt requested sponge bath instead of the shower this session.  Min assist for stand pivot transfer to the wheelchair with mod instructional cueing for hand positioning with sit to stand.  Pt performed UB bathing with supervision in sitting with mod assist for LB.  Pt will benefit from AE for reaching the LEs during dressing and bathing.  Therapist assisted with donning both shoes.  Min assist for standing during grooming task of brushing his teeth.  Pt requested return to bed at end of session secondary to not being able to get comfortable secondary to scrotum swelling.     Therapy Documentation Precautions:  Precautions Precautions: Fall Precaution Comments: wears Radio broadcast assistant when ambulating Restrictions Weight Bearing Restrictions: No LLE Weight Bearing: Weight bearing as tolerated  Pain: Pain Assessment Pain Assessment: Faces Pain Score: 0-No pain Faces Pain Scale: Hurts a little bit Pain Type: Acute pain Pain Location: Scrotum Pain Intervention(s): Repositioned Multiple Pain Sites: No ADL: See FIM for current functional status  Therapy/Group: Individual Therapy  Joneric Streight OTR/L 11/19/2013, 11:32 AM

## 2013-11-20 ENCOUNTER — Inpatient Hospital Stay (HOSPITAL_COMMUNITY): Payer: Medicare Other

## 2013-11-20 ENCOUNTER — Encounter (HOSPITAL_COMMUNITY): Payer: Medicare Other | Admitting: Occupational Therapy

## 2013-11-20 ENCOUNTER — Inpatient Hospital Stay (HOSPITAL_COMMUNITY): Payer: Medicare Other | Admitting: Physical Therapy

## 2013-11-20 ENCOUNTER — Inpatient Hospital Stay (HOSPITAL_COMMUNITY): Payer: Medicare Other | Admitting: Occupational Therapy

## 2013-11-20 DIAGNOSIS — I4891 Unspecified atrial fibrillation: Secondary | ICD-10-CM

## 2013-11-20 DIAGNOSIS — I319 Disease of pericardium, unspecified: Secondary | ICD-10-CM

## 2013-11-20 LAB — GLUCOSE, CAPILLARY
Glucose-Capillary: 111 mg/dL — ABNORMAL HIGH (ref 70–99)
Glucose-Capillary: 116 mg/dL — ABNORMAL HIGH (ref 70–99)
Glucose-Capillary: 140 mg/dL — ABNORMAL HIGH (ref 70–99)
Glucose-Capillary: 124 mg/dL — ABNORMAL HIGH (ref 70–99)

## 2013-11-20 LAB — PROTIME-INR
INR: 2.55 — AB (ref 0.00–1.49)
Prothrombin Time: 27.4 seconds — ABNORMAL HIGH (ref 11.6–15.2)

## 2013-11-20 LAB — BASIC METABOLIC PANEL
Anion gap: 12 (ref 5–15)
BUN: 41 mg/dL — ABNORMAL HIGH (ref 6–23)
CALCIUM: 8.7 mg/dL (ref 8.4–10.5)
CO2: 25 meq/L (ref 19–32)
CREATININE: 1.83 mg/dL — AB (ref 0.50–1.35)
Chloride: 98 mEq/L (ref 96–112)
GFR calc Af Amer: 39 mL/min — ABNORMAL LOW (ref >90)
GFR calc non Af Amer: 34 mL/min — ABNORMAL LOW (ref >90)
GLUCOSE: 109 mg/dL — AB (ref 70–99)
Potassium: 4.4 mEq/L (ref 3.7–5.3)
Sodium: 135 mEq/L — ABNORMAL LOW (ref 137–147)

## 2013-11-20 MED ORDER — FUROSEMIDE 20 MG PO TABS
20.0000 mg | ORAL_TABLET | Freq: Every day | ORAL | Status: DC
  Administered 2013-11-20 – 2013-11-21 (×2): 20 mg via ORAL
  Filled 2013-11-20 (×4): qty 1

## 2013-11-20 MED ORDER — WARFARIN SODIUM 1 MG PO TABS
1.5000 mg | ORAL_TABLET | Freq: Once | ORAL | Status: AC
  Administered 2013-11-20: 1.5 mg via ORAL
  Filled 2013-11-20 (×2): qty 1

## 2013-11-20 MED ORDER — AMIODARONE HCL 200 MG PO TABS
200.0000 mg | ORAL_TABLET | Freq: Every day | ORAL | Status: DC
  Administered 2013-11-21 – 2013-11-27 (×7): 200 mg via ORAL
  Filled 2013-11-20 (×8): qty 1
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
## 2013-11-20 NOTE — Progress Notes (Signed)
ANTICOAGULATION CONSULT NOTE - Follow Up Consult  Pharmacy Consult for coumadin Indication: afib and atrial thrombus  No Known Allergies  Patient Measurements: Height: 5\' 7"  (170.2 cm) Weight: 190 lb 14.7 oz (86.6 kg) IBW/kg (Calculated) : 66.1 Heparin Dosing Weight:   Vital Signs: Temp: 98.1 F (36.7 C) (08/20 0509) Temp src: Oral (08/20 0509) BP: 137/46 mmHg (08/20 0810) Pulse Rate: 55 (08/20 0810)  Labs:  Recent Labs  11/18/13 0642 11/19/13 0448 11/19/13 1642 11/19/13 1643 11/20/13 0718  HGB  --   --  9.2*  --   --   HCT  --   --  28.2*  --   --   PLT  --   --  214  --   --   LABPROT 25.3* 23.8*  --   --  27.4*  INR 2.30* 2.13*  --   --  2.55*  CREATININE  --   --  1.70*  --   --   TROPONINI  --   --   --  <0.30  --     Estimated Creatinine Clearance: 37.6 ml/min (by C-G formula based on Cr of 1.7).   Medications:  Scheduled:  . amiodarone  400 mg Oral Daily  . amLODipine  2.5 mg Oral Daily  . aspirin EC  81 mg Oral Daily  . chlorthalidone  25 mg Oral Daily  . doxycycline  100 mg Oral Q12H  . feeding supplement (GLUCERNA SHAKE)  237 mL Oral BID BM  . ferrous sulfate  325 mg Oral TID WC  . insulin aspart  0-5 Units Subcutaneous QHS  . metoprolol tartrate  12.5 mg Oral BID  . senna-docusate  1 tablet Oral BID  . simvastatin  10 mg Oral q1800  . Warfarin - Pharmacist Dosing Inpatient   Does not apply q1800   Infusions:    Assessment: 80 yo male with afib and atrial thrombus is currently on therapeutic coumadin but INR up to 2.55 from 2.13 after only one dose of coumadin 2 mg.  Patient is on doxycyline.  Goal of Therapy:  INR 2-3 Monitor platelets by anticoagulation protocol: Yes   Plan:  1. Coumadin 1.5 mg po x 1 2. Daily INR    [name], Tsz-Yin 11/20/2013,8:16 AM

## 2013-11-20 NOTE — Progress Notes (Signed)
Occupational Therapy Session Note  Patient Details  Name: [name] MRN: [id number] Date of Birth: 18-May-1933  Today's Date: 11/20/2013 OT Individual Time: 1415-1500 OT Individual Time Calculation (min): 45 min (missed 15 minutes due to medical issues with vital signs and c/o of nausea.  RN requested no OOB activities)  Short Term Goals: Week 1:  OT Short Term Goal 1 (Week 1): STG = LTGs due to ELOS  Skilled Therapeutic Interventions/Progress Updates:  Patient resting in bed upon arrival.  PT vital signs noted and this OT Vital signs recoded while in supine before activity was: 44 HR, 96% O2, 116/40 BP.  RN requested no OOB activity and patient reported that he didn't really want to do any therapy however, he was willing to complete some BUE theraband exercises. Patient required several rest breaks and his daughter can in near end of session to visit and observe.     Therapy Documentation Precautions:  Precautions Precautions: Fall Precaution Comments: wears Unna boot when ambulating Restrictions Weight Bearing Restrictions: No LLE Weight Bearing: Weight bearing as tolerated Pain: No report of pain just discomfort in chest area "where my surgery was"  Therapy/Group: Individual Therapy  [name] 11/20/2013, 5:32 PM

## 2013-11-20 NOTE — Progress Notes (Signed)
Occupational Therapy Session Note  Patient Details  Name: [name] MRN: [id number] Date of Birth: 07/16/1933  Today's Date: 11/20/2013 OT Individual Time: 1000-1100 OT Individual Time Calculation (min): 60 min   Short Term Goals: Week 1:  OT Short Term Goal 1 (Week 1): STG = LTGs due to ELOS  Skilled Therapeutic Interventions/Progress Updates:  Patient received supine in bed with no complaints of pain. Patient engaged in bed mobility with minimal assistance from therapist and sat EOB; 02 sats checked=88 on room air and HR=47. Therapist encouraged pursed lip breathing and 02 sats increased ->91% within a few seconds. Therapist assisted with donning of left shoe and una boot. Patient stood with RW and with complaints of dizziness. Therapist encouraged patient to sit down and 02 sats were <90%, HR around 47. Patient with request to use bathroom, due to increased dizziness, therapist encouraged BSC transfer as opposed to ambulating into bathroom. Therapist placed BSC beside bed and patient performed stand pivot transfer using RW with steady assist. No bathroom results, so patient stood with RW and transferred back to EOB. No more complaints of dizziness. From here, patient transferred EOB>w/c with steady assist. Patient then performed UB/LB bathing and grooming tasks at sink in sit<>stand position. 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 At end of session, left patient seated in w/c with all needs within reach.   Precautions:  Precautions Precautions: Fall Precaution Comments: wears Unna boot when ambulating Restrictions Weight Bearing Restrictions: No LLE Weight Bearing: Weight bearing as tolerated  See FIM for current functional status  Therapy/Group: Individual Therapy  Gurleen Larrivee 11/20/2013, 11:47 AM

## 2013-11-20 NOTE — Progress Notes (Signed)
Social Work Patient ID: [name], male   DOB: [birth year], 80 y.o.   MRN: [id number]  Spoke yesterday with pt's son, Loraine Leriche, to review team conference including target d/c date of 8/25 with minimal assist goals overall.  Son stresses that pt's wife has had a h/o CVA and dependent on rw at all times.  He does not feel she could provide any physical assistance to pt and adds that all of their children are working.  Discussed option of SNF placement which son feels is only option at this point unless pt able to exceed projected goals.   I did follow up with pt this morning to review this same information and to introduce the option of SNF.  Initially pt states, "I don't want to do that", however, after further discussion about reality of what care family can provide, pt was reluctantly agreeable to this plan.   I am also aware of current medical issues going on with pt per chart notes and have stressed to pt that I am merely planning forward but that we need to continue to address his medical stability at this point.  Pt appeared to calm with this and admits that he is concerned with "my heart and this swelling."   Will continue to follow for support and d/c planning needs.  Feel  certain, however, that SNF will remain the d/c plan for this pt.  Cane Dubray, LCSW

## 2013-11-20 NOTE — Progress Notes (Signed)
Chart and note reviewed.  Ian Malkin RD, LDN Pager: [phone]
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
## 2013-11-20 NOTE — Progress Notes (Signed)
80 y.o. right-handed male with history of previous CVA with little residual, hypertension, diabetes mellitus peripheral neuropathy with chronic nonhealing foot ulcers, chronic renal insufficiency with baseline creatinine 1.56 as well as pericarditis associated with pericardial effusion which was identified during workup of acute CVA. Patient used a walker prior to admission. Wife also with history of CVA and she also uses a walker. Presented 11/02/2013 with chest tightness and palpitations. Echocardiogram with ejection fraction of 50% large pericardial effusion identified features consistent with a moderate tamponade as well as incidental findings of LAA thrombus. Underwent percutaneous pericardiocentesis 11/03/2013 per Dr. Martinique with removal proximally 750 mL of fluid. Followup echocardiogram showed resolution of pericardial effusion with echocardiogram again later completed showing some reaccumulation of fluid  Subjective/Complaints: Pt became less responsive, diaphoretic late yesterday. ?fluid overload---responded to IV lasix. Feeling better today. Scrotum still quite swollen    Objective: Vital Signs: Blood pressure 137/46, pulse 55, temperature 98.1 F (36.7 C), temperature source Oral, resp. rate 16, height 5' 7"  (1.702 m), weight 86.6 kg (190 lb 14.7 oz), SpO2 100.00%. Dg Chest 2 View  11/20/2013   CLINICAL DATA:  Followup effusions.  EXAM: CHEST  2 VIEW  COMPARISON:  11/20/2011  FINDINGS: Hypoinflation. Bibasilar opacification best seen posteriorly on the lateral film suggestive persistent small effusions left greater than right with associated atelectasis as findings are slightly improved based on the frontal exam. Mild cardiomegaly likely due in part to hypoinflation and patient positioning. Remainder of the exam is unchanged.  IMPRESSION: Persistent bibasilar opacification likely small effusions with atelectasis demonstrating slight interval improvement.   Electronically Signed   By: Marin Olp M.D.   On: 11/20/2013 08:13   Dg Chest Port 1 View  11/19/2013   CLINICAL DATA:  Follow up effusion.  EXAM: PORTABLE CHEST - 1 VIEW  COMPARISON:  11/12/2013 and 11/09/2013 radiographs.  CT 10/02/2013.  FINDINGS: 1658 hr. There are lower lung volumes with increased opacities at both lung bases. These are probably due to enlarging pleural effusions and associated bibasilar atelectasis. Mild enlargement of the cardiac silhouette appears stable. No residual pneumoperitoneum is evident.  IMPRESSION: Increased density at both lung bases, probably related to enlarging pleural effusions and associated bibasilar opacities. Pleural assessment is limited on this AP semi-erect view.   Electronically Signed   By: Camie Patience M.D.   On: 11/19/2013 17:26   Dg Abd Portable 2v  11/19/2013   CLINICAL DATA:  Malaise.  EXAM: PORTABLE ABDOMEN - 2 VIEW  COMPARISON:  11/12/2013 and CT chest 10/02/2013.  FINDINGS: Scattered gas in nondistended colon. No small bowel dilatation. No free air. Vascular calcifications are seen in the left upper quadrant. Scattered phleboliths. Degenerative changes are seen in the spine.  IMPRESSION: No acute findings.   Electronically Signed   By: Lorin Picket M.D.   On: 11/19/2013 17:26   Results for orders placed during the hospital encounter of 11/13/13 (from the past 72 hour(s))  URINE CULTURE     Status: None   Collection Time    11/17/13  9:18 AM      Result Value Ref Range   Specimen Description URINE, CATHETERIZED     Special Requests NONE     Culture  Setup Time       Value: 11/17/2013 10:24     Performed at [facility]       Value: NO GROWTH     Performed at [facility]  Value: NO GROWTH     Performed at [facility]   Report Status 11/18/2013 FINAL    URINALYSIS, ROUTINE W REFLEX MICROSCOPIC     Status: None   Collection Time    11/17/13  9:18 AM      Result Value Ref Range   Color, Urine YELLOW  YELLOW    APPearance CLEAR  CLEAR   Specific Gravity, Urine 1.015  1.005 - 1.030   pH 5.0  5.0 - 8.0   Glucose, UA NEGATIVE  NEGATIVE mg/dL   Hgb urine dipstick NEGATIVE  NEGATIVE   Bilirubin Urine NEGATIVE  NEGATIVE   Ketones, ur NEGATIVE  NEGATIVE mg/dL   Protein, ur NEGATIVE  NEGATIVE mg/dL   Urobilinogen, UA 0.2  0.0 - 1.0 mg/dL   Nitrite NEGATIVE  NEGATIVE   Leukocytes, UA NEGATIVE  NEGATIVE   Comment: MICROSCOPIC NOT DONE ON URINES WITH NEGATIVE PROTEIN, BLOOD, LEUKOCYTES, NITRITE, OR GLUCOSE <1000 mg/dL.  GLUCOSE, CAPILLARY     Status: Abnormal   Collection Time    11/17/13 11:06 AM      Result Value Ref Range   Glucose-Capillary 114 (*) 70 - 99 mg/dL   Comment 1 Notify RN    GLUCOSE, CAPILLARY     Status: Abnormal   Collection Time    11/17/13  4:23 PM      Result Value Ref Range   Glucose-Capillary 155 (*) 70 - 99 mg/dL   Comment 1 Notify RN    GLUCOSE, CAPILLARY     Status: Abnormal   Collection Time    11/17/13  9:44 PM      Result Value Ref Range   Glucose-Capillary 168 (*) 70 - 99 mg/dL  PROTIME-INR     Status: Abnormal   Collection Time    11/18/13  6:42 AM      Result Value Ref Range   Prothrombin Time 25.3 (*) 11.6 - 15.2 seconds   INR 2.30 (*) 0.00 - 1.49  GLUCOSE, CAPILLARY     Status: Abnormal   Collection Time    11/18/13  6:51 AM      Result Value Ref Range   Glucose-Capillary 119 (*) 70 - 99 mg/dL  GLUCOSE, CAPILLARY     Status: Abnormal   Collection Time    11/18/13 11:58 AM      Result Value Ref Range   Glucose-Capillary 117 (*) 70 - 99 mg/dL  GLUCOSE, CAPILLARY     Status: Abnormal   Collection Time    11/18/13  4:32 PM      Result Value Ref Range   Glucose-Capillary 139 (*) 70 - 99 mg/dL  GLUCOSE, CAPILLARY     Status: Abnormal   Collection Time    11/18/13  9:27 PM      Result Value Ref Range   Glucose-Capillary 142 (*) 70 - 99 mg/dL  PROTIME-INR     Status: Abnormal   Collection Time    11/19/13  4:48 AM      Result Value Ref Range    Prothrombin Time 23.8 (*) 11.6 - 15.2 seconds   INR 2.13 (*) 0.00 - 1.49  GLUCOSE, CAPILLARY     Status: Abnormal   Collection Time    11/19/13  6:38 AM      Result Value Ref Range   Glucose-Capillary 135 (*) 70 - 99 mg/dL   Comment 1 Notify RN    GLUCOSE, CAPILLARY     Status: Abnormal   Collection Time    11/19/13  11:33 AM      Result Value Ref Range   Glucose-Capillary 130 (*) 70 - 99 mg/dL   Comment 1 Notify RN    GLUCOSE, CAPILLARY     Status: Abnormal   Collection Time    11/19/13  3:26 PM      Result Value Ref Range   Glucose-Capillary 119 (*) 70 - 99 mg/dL  BLOOD GAS, ARTERIAL     Status: Abnormal   Collection Time    11/19/13  4:40 PM      Result Value Ref Range   O2 Content ROOM AIR     pH, Arterial 7.464 (*) 7.350 - 7.450   pCO2 arterial 32.9 (*) 35.0 - 45.0 mmHg   pO2, Arterial 58.3 (*) 80.0 - 100.0 mmHg   Bicarbonate 23.3  20.0 - 24.0 mEq/L   TCO2 24.3  0 - 100 mmol/L   Acid-base deficit 0.0  0.0 - 2.0 mmol/L   O2 Saturation 91.2     Patient temperature 98.6     Collection site RIGHT RADIAL     Drawn by [id number]     Sample type ARTERIAL DRAW     Allens test (pass/fail) PASS  PASS   Comment: Performed at [facility] PANEL     Status: Abnormal   Collection Time    11/19/13  4:42 PM      Result Value Ref Range   Sodium 131 (*) 137 - 147 mEq/L   Potassium 5.0  3.7 - 5.3 mEq/L   Comment: HEMOLYSIS AT THIS LEVEL MAY AFFECT RESULT   Chloride 96  96 - 112 mEq/L   CO2 22  19 - 32 mEq/L   Glucose, Bld 139 (*) 70 - 99 mg/dL   BUN 41 (*) 6 - 23 mg/dL   Creatinine, Ser 1.70 (*) 0.50 - 1.35 mg/dL   Calcium 8.9  8.4 - 10.5 mg/dL   GFR calc non Af Amer 37 (*) >90 mL/min   GFR calc Af Amer 43 (*) >90 mL/min   Comment: (NOTE)     The eGFR has been calculated using the CKD EPI equation.     This calculation has not been validated in all clinical situations.     eGFR's persistently <90 mL/min signify possible Chronic Kidney      Disease.   Anion gap 13  5 - 15  CBC WITH DIFFERENTIAL     Status: Abnormal   Collection Time    11/19/13  4:42 PM      Result Value Ref Range   WBC 12.5 (*) 4.0 - 10.5 K/uL   RBC 3.27 (*) 4.22 - 5.81 MIL/uL   Hemoglobin 9.2 (*) 13.0 - 17.0 g/dL   HCT 28.2 (*) 39.0 - 52.0 %   MCV 86.2  78.0 - 100.0 fL   MCH 28.1  26.0 - 34.0 pg   MCHC 32.6  30.0 - 36.0 g/dL   RDW 16.3 (*) 11.5 - 15.5 %   Platelets 214  150 - 400 K/uL   Neutrophils Relative % 91 (*) 43 - 77 %   Neutro Abs 11.3 (*) 1.7 - 7.7 K/uL   Lymphocytes Relative 5 (*) 12 - 46 %   Lymphs Abs 0.7  0.7 - 4.0 K/uL   Monocytes Relative 4  3 - 12 %   Monocytes Absolute 0.5  0.1 - 1.0 K/uL   Eosinophils Relative 0  0 - 5 %   Eosinophils Absolute 0.0  0.0 - 0.7  K/uL   Basophils Relative 0  0 - 1 %   Basophils Absolute 0.0  0.0 - 0.1 K/uL  TROPONIN I     Status: None   Collection Time    11/19/13  4:43 PM      Result Value Ref Range   Troponin I <0.30  <0.30 ng/mL   Comment:            Due to the release kinetics of cTnI,     a negative result within the first hours     of the onset of symptoms does not rule out     myocardial infarction with certainty.     If myocardial infarction is still suspected,     repeat the test at appropriate intervals.  GLUCOSE, CAPILLARY     Status: Abnormal   Collection Time    11/19/13  8:39 PM      Result Value Ref Range   Glucose-Capillary 145 (*) 70 - 99 mg/dL   Comment 1 Notify RN    GLUCOSE, CAPILLARY     Status: Abnormal   Collection Time    11/20/13  7:15 AM      Result Value Ref Range   Glucose-Capillary 111 (*) 70 - 99 mg/dL  PROTIME-INR     Status: Abnormal   Collection Time    11/20/13  7:18 AM      Result Value Ref Range   Prothrombin Time 27.4 (*) 11.6 - 15.2 seconds   INR 2.55 (*) 0.00 - 1.49     HEENT: normal Cardio: RRR Resp: CTA B/L GI: BS positive and NT, ND Extremity:  Edema R pre tib, LLE in UNNA boot Skin:   Intact and Wound UNNA boot , new no soak through, +  foul odor continues, area non-tender Neuro: Alert/Oriented and Abnormal Motor 3-/5 Left HF, KE, Ankle NT secondary to UNNA, 4/5 BUE andRLE Musc/Skel:  Other No calf tenderness GEN NAD GU: scrotum swollen to size of canteloupe, nontender, no erythema--tender moreso on the left side---unchanged, penis minimally visible  Assessment/Plan: 1. Functional deficits secondary to deconditioning secondary to pericardial effusion which require 3+ hours per day of interdisciplinary therapy in a comprehensive inpatient rehab setting. Physiatrist is providing close team supervision and 24 hour management of active medical problems listed below. Physiatrist and rehab team continue to assess barriers to discharge/monitor patient progress toward functional and medical goals. FIM: FIM - Bathing Bathing Steps Patient Completed: Chest;Right Arm;Left Arm;Abdomen;Front perineal area;Right upper leg;Left upper leg Bathing: 3: Mod-Patient completes 5-7 [id number] parts or 50-74%  FIM - Upper Body Dressing/Undressing Upper body dressing/undressing steps patient completed: Thread/unthread right sleeve of pullover shirt/dresss;Thread/unthread left sleeve of pullover shirt/dress;Put head through opening of pull over shirt/dress;Pull shirt over trunk Upper body dressing/undressing: 5: Supervision: Safety issues/verbal cues FIM - Lower Body Dressing/Undressing Lower body dressing/undressing steps patient completed: Pull pants up/down Lower body dressing/undressing: 1: Total-Patient completed less than 25% of tasks  FIM - Toileting Toileting steps completed by patient: Adjust clothing prior to toileting Toileting: 1: Total-Patient completed zero steps, helper did all 3  FIM - TRadio producerDevices: Grab bars;Walker Toilet Transfers: 5-To toilet/BSC: Supervision (verbal cues/safety issues);5-From toilet/BSC: Supervision (verbal cues/safety issues)  FIM - BBuyer, retailDevices: Walker;Arm rests Bed/Chair Transfer: 4: Supine > Sit: Min A (steadying Pt. > 75%/lift 1 leg);5: Sit > Supine: Supervision (verbal cues/safety issues);5: Bed > Chair or W/C: Supervision (verbal cues/safety issues);5: Chair or W/C > Bed: Supervision (  verbal cues/safety issues)  FIM - Locomotion: Wheelchair Distance: 75' Locomotion: Wheelchair: 2: Travels 1 - 149 ft with supervision, cueing or coaxing FIM - Locomotion: Ambulation Locomotion: Ambulation Assistive Devices: Administrator Ambulation/Gait Assistance: 4: Min guard Locomotion: Ambulation: 2: Travels 50 - 149 ft with minimal assistance (Pt.>75%)  Comprehension Comprehension Mode: Auditory Comprehension: 5-Follows basic conversation/direction: With extra time/assistive device  Expression Expression Mode: Verbal Expression: 5-Expresses complex 90% of the time/cues < 10% of the time  Social Interaction Social Interaction: 5-Interacts appropriately 90% of the time - Needs monitoring or encouragement for participation or interaction.  Problem Solving Problem Solving: 5-Solves basic 90% of the time/requires cueing < 10% of the time  Memory Memory: 4-Recognizes or recalls 75 - 89% of the time/requires cueing 10 - 24% of the time  Medical Problem List and Plan:   1. Functional deficits secondary to deconditioning after pericardial effusion status post subxiphoid pericardial window 11/09/2013   2. DVT Prophylaxis/Anticoagulation: Initiation of Coumadin for LAAthrombus as well intermittent atrial fibrillation. Monitor for any bleeding episodes   3. Pain Management: Oxycodone as needed. Monitor with increased mobility   4. acute blood loss anemia. Continue iron supplement. Followup CBC   5. Neuropsych: This patient is capable of making decisions on his own behalf.   6. Skin/Wound Care/chronic diabetic left foot ulcer.: Followup wound care nurse. Continue Haematologist. Doxycycline for wound coverage   -continue  wraps,wound care 7. Diabetes mellitus with peripheral neuropathy. Latest hemoglobin A1c 5.9. Check blood sugars a.c. and at bedtime. Continue sliding scale insulin    -fair control 8. Hypertension/atrial fibrillation. Lopressor 12.5 mg twice a day, Norvasc 5 mg daily, amiodarone 400 mg daily.   - CHF---responsive to lasix yesterday, cxr with effusions---comfortable this am  -check daily weights  -begin low dose daily oral lasix today  -will ask cards to follow up as well (still on 474m amiodarone) 9. Chronic renal insufficiency. Baseline creatinine 1.56.  Continue to follow    -recheck labs tomorrow given initation of lasix 10. Hyperlipidemia. Zocor   11. Uro/scrotal edema: repeat ucx neg  -rx scrotal edema with ice/elevation  -continue foley cath due to difficulty voiding   -prior U/S revealed herniated bowel into left scrotum---will d/w gen surgery   LOS (Days) 7 A FACE TO FACE EVALUATION WAS PERFORMED  SWARTZ,ZACHARY T 11/20/2013, 8:31 AM

## 2013-11-20 NOTE — Progress Notes (Signed)
Physical Therapy Session Note  Patient Details  Name: [name] MRN: [id number] Date of Birth: 02-05-1934  Today's Date: 11/20/2013 PT Individual Time: 1300-1400 PT Individual Time Calculation (min): 60 min   Short Term Goals: Week 1:  PT Short Term Goal 1 (Week 1): STGs=LTGs due to ELOS  Skilled Therapeutic Interventions/Progress Updates:    Therapeutic Activity: PT instructs pt in supine to sit transfer via side lie x 4 reps req verbal cues for technique and min A at trunk for effort.  PT instructs pt in sit to supine transfer x 4 reps req verbal cues for technique and SBA, progressing to mod A due to fatigue.  PT instructs pt in scooting up in supine in bed with B armrails req min A from flat bed and SBA when bed is positioned in [place].   Therapeutic Exercise: PT instructs pt in bed level exercises for B LE strengthening: SLR, supine hip abduction/adduction, R ankle pumps, isometric glute contractions x 10 reps each. Significant rest breaks taken in between each leg/exercise.   Pt demonstrates significantly orthostatic vitals during PT session today, and so session was limited to bed level exercises, only. Pt continues to be forgetful with safety precautions (hand placement) and continues to req supervision/verbal cues for safety with mobility. PT spoke with RN and reported orthostatic vitals. PT also educated RN to ensure [facility] boot is on L foot whenever pt is lying down in bed; PT updated pink sheet, as well. PT educated pt that he should NOT walk in [facility] boot, but should wear walking shoe when needing to walk, for example, to the bathroom. Pt's endurance for physical activity is signfiicantly worse than previous day, likely due to blood pressure/dizziness issues. PT completes gentle therapy during today's session, so as not to exhaust pt's body systems.     Therapy Documentation Precautions:  Precautions Precautions: Fall Precaution Comments: wears Radio broadcast assistant when  ambulating Restrictions Weight Bearing Restrictions: No LLE Weight Bearing: Weight bearing as tolerated    Vital Signs: Therapy Vitals Patient Position (if appropriate): Orthostatic Vitals Oxygen Therapy SpO2: 98 % O2 Device: None (Room air) Pain: Pain Assessment Pain Assessment: No/denies pain Pain Score: 1   See FIM for current functional status  Therapy/Group: Individual Therapy  Lovely Kerins M 11/20/2013, 1:10 PM

## 2013-11-20 NOTE — Progress Notes (Signed)
  Echocardiogram 2D Echocardiogram has been performed.  [name] M 11/20/2013, 4:39 PM

## 2013-11-21 ENCOUNTER — Inpatient Hospital Stay (HOSPITAL_COMMUNITY): Payer: Medicare Other

## 2013-11-21 ENCOUNTER — Encounter (HOSPITAL_COMMUNITY): Payer: Self-pay | Admitting: Cardiology

## 2013-11-21 ENCOUNTER — Inpatient Hospital Stay (HOSPITAL_COMMUNITY): Payer: Medicare Other | Admitting: *Deleted

## 2013-11-21 ENCOUNTER — Inpatient Hospital Stay (HOSPITAL_COMMUNITY): Payer: Medicare Other | Admitting: Physical Therapy

## 2013-11-21 DIAGNOSIS — R9439 Abnormal result of other cardiovascular function study: Secondary | ICD-10-CM

## 2013-11-21 DIAGNOSIS — N183 Chronic kidney disease, stage 3 unspecified: Secondary | ICD-10-CM

## 2013-11-21 DIAGNOSIS — R5381 Other malaise: Secondary | ICD-10-CM

## 2013-11-21 DIAGNOSIS — R609 Edema, unspecified: Secondary | ICD-10-CM

## 2013-11-21 LAB — BASIC METABOLIC PANEL
ANION GAP: 9 (ref 5–15)
BUN: 43 mg/dL — ABNORMAL HIGH (ref 6–23)
CHLORIDE: 98 meq/L (ref 96–112)
CO2: 29 mEq/L (ref 19–32)
Calcium: 8.8 mg/dL (ref 8.4–10.5)
Creatinine, Ser: 1.92 mg/dL — ABNORMAL HIGH (ref 0.50–1.35)
GFR calc Af Amer: 37 mL/min — ABNORMAL LOW (ref >90)
GFR calc non Af Amer: 32 mL/min — ABNORMAL LOW (ref >90)
Glucose, Bld: 134 mg/dL — ABNORMAL HIGH (ref 70–99)
POTASSIUM: 3.9 meq/L (ref 3.7–5.3)
Sodium: 136 mEq/L — ABNORMAL LOW (ref 137–147)

## 2013-11-21 LAB — PROTIME-INR
INR: 2.48 — ABNORMAL HIGH (ref 0.00–1.49)
PROTHROMBIN TIME: 26.8 s — AB (ref 11.6–15.2)

## 2013-11-21 LAB — GLUCOSE, CAPILLARY
GLUCOSE-CAPILLARY: 116 mg/dL — AB (ref 70–99)
GLUCOSE-CAPILLARY: 134 mg/dL — AB (ref 70–99)
Glucose-Capillary: 129 mg/dL — ABNORMAL HIGH (ref 70–99)
Glucose-Capillary: 146 mg/dL — ABNORMAL HIGH (ref 70–99)

## 2013-11-21 MED ORDER — WARFARIN SODIUM 1 MG PO TABS
1.5000 mg | ORAL_TABLET | Freq: Once | ORAL | Status: AC
  Administered 2013-11-21: 1.5 mg via ORAL
  Filled 2013-11-21: qty 1

## 2013-11-21 MED ORDER — FUROSEMIDE 40 MG PO TABS
40.0000 mg | ORAL_TABLET | Freq: Every day | ORAL | Status: DC
  Administered 2013-11-22 – 2013-11-26 (×5): 40 mg via ORAL
  Filled 2013-11-21 (×7): qty 1

## 2013-11-21 NOTE — Progress Notes (Signed)
80 year old gentleman on rehab after hospitalization and deconditioning.  He has hx of a cerebrovascular accident. Past history of recent pericardial effusion with tamponade- on admit 11/02/13. Pericardiocentesis 11/03/13 then recurrence of effusion,  underwent pericardial window 11/07/13.  During hospitalization he had PAF, but with amiodarone maintained SR. Noted to have large atrial thrombus on TEE on the 7th. Now with volume overload.   Subjective: Feels better today, no complaints.  Objective: Vital signs in last 24 hours: Temp:  [97.7 F (36.5 C)-98 F (36.7 C)] 98 F (36.7 C) (08/21 0417) Pulse Rate:  [44-53] 53 (08/21 0420) Resp:  [18] 18 (08/21 0417) BP: (116-136)/(40-86) 136/86 mmHg (08/21 0420) SpO2:  [96 %-100 %] 100 % (08/21 0417) Weight:  [181 lb 14.1 oz (82.5 kg)] 181 lb 14.1 oz (82.5 kg) (08/21 0420) Weight change: -9 lb 0.6 oz (-4.1 kg) Last BM Date: 11/20/13 Intake/Output from previous day: -1130  08/20 0701 - 08/21 0700 In: 720 [P.O.:720] Out: 1850 [Urine:1850] Intake/Output this shift: Total I/O In: 240 [P.O.:240] Out: -   PE: General:Pleasant affect, NAD Skin:Warm and dry, brisk capillary refill HEENT:normocephalic, sclera clear, mucus membranes moist Neck:supple, no JVD  Heart:S1S2 RRR without murmur, gallup, rub or click Lungs:clear without rales, rhonchi, or wheezes ZOX:WRUE, non tender, + BS, do not palpate liver spleen or masses Ext:1+ lower ext edema rt > Lt,  2+ radial pulses, groin with scrotal edema Neuro:alert and oriented, MAE, follows commands, + facial symmetry   Lab Results:  Recent Labs  11/19/13 1642  WBC 12.5*  HGB 9.2*  HCT 28.2*  PLT 214   BMET  Recent Labs  11/20/13 0718 11/21/13 0500  NA 135* 136*  K 4.4 3.9  CL 98 98  CO2 25 29  GLUCOSE 109* 134*  BUN 41* 43*  CREATININE 1.83* 1.92*  CALCIUM 8.7 8.8    Recent Labs  11/19/13 1643  TROPONINI <0.30    Lab Results  Component Value Date   CHOL 83  10/01/2013   HDL 29* 10/01/2013   LDLCALC 42 10/01/2013   TRIG 58 10/01/2013   CHOLHDL 2.9 10/01/2013   Lab Results  Component Value Date   HGBA1C 5.9* 11/02/2013     Lab Results  Component Value Date   TSH 1.280 09/30/2013       Studies/Results: ECHO: Study Conclusions 11/20/13 - Left ventricle: LVEF is approximately 40% with hypokinesis of the lateral wall and basal inferior wall. The cavity size was mildly dilated. Wall thickness was normal. Features are consistent with a pseudonormal left ventricular filling pattern, with concomitant abnormal relaxation and increased filling pressure (grade 2 diastolic dysfunction). - Mitral valve: There was mild regurgitation. - Left atrium: The atrium was mildly dilated. - Pulmonary arteries: PA peak pressure: 51 mm Hg (S). - Pericardium, extracardiac: A small pericardial effusion was identified posterior to the heart.  Previous ECHO in July prior to pericardial window Study Conclusions - Left ventricle: The cavity size was normal. Wall thickness was increased in a pattern of mild LVH. Systolic function was normal. The estimated ejection fraction was in the range of 60% to 65%. Wall motion was normal; there were no regional wall motion abnormalities. Doppler parameters are consistent with abnormal left ventricular relaxation (grade 1 diastolic dysfunction). - Aortic root: The aortic root was mildly dilated. - Mitral valve: There was mild regurgitation. - Pericardium, extracardiac: A large pericardial effusion was identified circumferential to the heart. There was chamber collapse. There was right ventricular chamber collapse.  There was right ventricular chamber collapse. Features were consistent with moderate tamponade physiology.     Dg Chest 2 View  11/20/2013   CLINICAL DATA:  Followup effusions.  EXAM: CHEST  2 VIEW  COMPARISON:  11/20/2011  FINDINGS: Hypoinflation. Bibasilar opacification best seen posteriorly on the lateral film  suggestive persistent small effusions left greater than right with associated atelectasis as findings are slightly improved based on the frontal exam. Mild cardiomegaly likely due in part to hypoinflation and patient positioning. Remainder of the exam is unchanged.  IMPRESSION: Persistent bibasilar opacification likely small effusions with atelectasis demonstrating slight interval improvement.   Electronically Signed   By: Elberta Fortis M.D.   On: 11/20/2013 08:13   Dg Chest Port 1 View  11/19/2013   CLINICAL DATA:  Follow up effusion.  EXAM: PORTABLE CHEST - 1 VIEW  COMPARISON:  11/12/2013 and 11/09/2013 radiographs.  CT 10/02/2013.  FINDINGS: 1658 hr. There are lower lung volumes with increased opacities at both lung bases. These are probably due to enlarging pleural effusions and associated bibasilar atelectasis. Mild enlargement of the cardiac silhouette appears stable. No residual pneumoperitoneum is evident.  IMPRESSION: Increased density at both lung bases, probably related to enlarging pleural effusions and associated bibasilar opacities. Pleural assessment is limited on this AP semi-erect view.   Electronically Signed   By: Roxy Horseman M.D.   On: 11/19/2013 17:26   Dg Abd Portable 2v  11/19/2013   CLINICAL DATA:  Malaise.  EXAM: PORTABLE ABDOMEN - 2 VIEW  COMPARISON:  11/12/2013 and CT chest 10/02/2013.  FINDINGS: Scattered gas in nondistended colon. No small bowel dilatation. No free air. Vascular calcifications are seen in the left upper quadrant. Scattered phleboliths. Degenerative changes are seen in the spine.  IMPRESSION: No acute findings.   Electronically Signed   By: Leanna Battles M.D.   On: 11/19/2013 17:26    Medications: I have reviewed the patient's current medications. Scheduled Meds: . amiodarone  200 mg Oral Daily  . amLODipine  2.5 mg Oral Daily  . aspirin EC  81 mg Oral Daily  . chlorthalidone  25 mg Oral Daily  . doxycycline  100 mg Oral Q12H  . feeding supplement  (GLUCERNA SHAKE)  237 mL Oral BID BM  . ferrous sulfate  325 mg Oral TID WC  . furosemide  20 mg Oral Daily  . insulin aspart  0-5 Units Subcutaneous QHS  . metoprolol tartrate  12.5 mg Oral BID  . senna-docusate  1 tablet Oral BID  . simvastatin  10 mg Oral q1800  . warfarin  1.5 mg Oral ONCE-1800  . Warfarin - Pharmacist Dosing Inpatient   Does not apply q1800   Continuous Infusions:  PRN Meds:.acetaminophen, albuterol, ondansetron (ZOFRAN) IV, ondansetron, oxyCODONE, sorbitol  Assessment/Plan: 1. Left ventricular systolic dysfunction with ejection fraction 45-50% by echo no 40%, continued decrease in EF since July.  Originally seen for the effusion after admit for ortho stasis.  Increase lasix to 40 mg daily 2. recent pericarditis with pericardial effusion with tamponade requiring pericardiocentesis and then pericardial window for re accumulation done 11/07/13 3. history of paroxysmal atrial fibrillation with recent admit  on warfarin currenlty in SR 4. 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  deconditioning 6. Herniated bowel into scrotum, with scrotal edema as well. 7. SCr. Elevated 1.92 GFR 32 similar to 1 year ago   LOS: 8 days   Time spent with pt. :15 minutes. [facility] R  Nurse Practitioner Certified Pager [phone] or after  5pm and on weekends call [phone] 11/21/2013, 11:29 AM   I have examined the patient and reviewed assessment and plan and discussed with patient.  Agree with above as stated.  Fluid overload.  Increase lasix to 40 mg daily.  Watch fluid status over the weekend. EF has decreased mildly over the past few months.  Medical therapy.  VARANASI,JAYADEEP S.

## 2013-11-21 NOTE — Progress Notes (Signed)
Occupational Therapy Session Note  Patient Details  Name: [name] MRN: [id number] Date of Birth: 1933-09-09  Today's Date: 11/21/2013 OT Individual Time:  -   1030-1130  (60 min)     Short Term Goals: Week 1:  OT Short Term Goal 1 (Week 1): STG = LTGs due to ELOS  Skilled Therapeutic Interventions/Progress Updates:   BP=  120/50 lying; 115/50  Sitting after standing for 5 minutes.  550 cc urin emptied.  Pt said he thought he had had a light stroke on his right leg.     Focus of treatment was bed mobility, transfers,  sitting balance, standing balance, therapeutic activities,  postural control. Monitored BP throughout session.   BP at end of session=  140/50.   Addressed therapeutic bathing and dressing at sink.  Did sit tostand with SBA and standing balance for 5 minutes to clean periarea.  Pt. Complained of dizziness but BP was stable. Pt. Was SBA with dynamic sitting balance.   Pt. Left in wc with call bell in reach.    Therapy Documentation Precautions:  Precautions Precautions: Fall Precaution Comments: wears Unna boot when ambulating Restrictions Weight Bearing Restrictions: No LLE Weight Bearing: Weight bearing as tolerated      Pain: Pain Assessment Pain Assessment: No/denies pain  See FIM for current functional status  Therapy/Group: Individual Therapy  Humberto Seals 11/21/2013, 8:19 AM

## 2013-11-21 NOTE — Progress Notes (Signed)
Physical Therapy Session Note  Patient Details  Name: [name] MRN: [id number] Date of Birth: 1933-11-06  Today's Date: 11/21/2013 PT Individual Time: 1505-1605 PT Individual Time Calculation (min): 60 min   Short Term Goals: Week 1:  PT Short Term Goal 1 (Week 1): STGs=LTGs due to ELOS  Skilled Therapeutic Interventions/Progress Updates:    Therapeutic Activity: PT instructs pt in supine to sit transfer req min A and verbal cues for hand placement. PT instructs pt to transfer supine to sit req mod A due to pt not feeling better after lying on mat. PT instructs pt in stand step transfer with RW req min A for balance. PT instructs pt in w/c to bed transfer with RW req min A and mod A sit to supine transfer in bed. Pt left with PRAFO boot on L foot, R foot on pillow and RN in room assessing pt.   Gait Training: PT instructs pt in ambulation x 75' x 2 reps req SBA, progressing to CGA with fatigue and RW.   Neuromuscular Reeducation: PT initiated Berg Balance Test, but pt reports feeling "funny" and PT notes a glazed over look approaching on his face, and so PT lies pt in supine on mat with feet in hook lie and pt reports starting to feel better.   Near the end of pt's treatment session today, pt began to feel poorly and required assist to lie down on the mat in the gym. Pt reported he felt dizzy while sitting edge of mat and PT notes a glazed over look on his face. PT assisted pt to lie down and he began to feel better, but then got a headache. Vitals monitored and pt's BP and HR were high for him. Pt explains that his R arm went numb feeling. Once pt felt better enough, PT assisted him back to his w/c, then back to his bed to lie down. RN notified of all that happened and began assessing pt after PT left him in his room. Pt continues to be very fragile and physically labile. Vitals should be monitored and care should be taken not to push pt too much during therapy session.   Therapy  Documentation Precautions:  Precautions Precautions: Fall Precaution Comments: wears Radio broadcast assistant when ambulating Restrictions Weight Bearing Restrictions: No LLE Weight Bearing: Weight bearing as tolerated    Vital Signs: Therapy Vitals Temp: 97.2 F (36.2 C) Temp src: Oral Pulse Rate: 56 Resp: 17 BP: 118/32 mmHg Patient Position (if appropriate): Lying Oxygen Therapy SpO2: 98 % O2 Device: None (Room air) Pain: Pain Assessment Pain Assessment: No/denies pain  See FIM for current functional status  Therapy/Group: Individual Therapy  Domanic Matusek M 11/21/2013, 3:18 PM

## 2013-11-21 NOTE — Progress Notes (Signed)
80 y.o. right-handed male with history of previous CVA with little residual, hypertension, diabetes mellitus peripheral neuropathy with chronic nonhealing foot ulcers, chronic renal insufficiency with baseline creatinine 1.56 as well as pericarditis associated with pericardial effusion which was identified during workup of acute CVA. Patient used a walker prior to admission. Wife also with history of CVA and she also uses a walker. Presented 11/02/2013 with chest tightness and palpitations. Echocardiogram with ejection fraction of 50% large pericardial effusion identified features consistent with a moderate tamponade as well as incidental findings of LAA thrombus. Underwent percutaneous pericardiocentesis 11/03/2013 per Dr. Martinique with removal proximally 750 mL of fluid. Followup echocardiogram showed resolution of pericardial effusion with echocardiogram again later completed showing some reaccumulation of fluid  Subjective/Complaints: Had a better day yesterday. Denies pain in scrotum unless he's up or trying to bend over/sit down. Breathing well---denies cough, sob, cp.    Objective: Vital Signs: Blood pressure 136/86, pulse 53, temperature 98 F (36.7 C), temperature source Oral, resp. rate 18, height 5' 7"  (1.702 m), weight 82.5 kg (181 lb 14.1 oz), SpO2 100.00%. Dg Chest 2 View  11/20/2013   CLINICAL DATA:  Followup effusions.  EXAM: CHEST  2 VIEW  COMPARISON:  11/20/2011  FINDINGS: Hypoinflation. Bibasilar opacification best seen posteriorly on the lateral film suggestive persistent small effusions left greater than right with associated atelectasis as findings are slightly improved based on the frontal exam. Mild cardiomegaly likely due in part to hypoinflation and patient positioning. Remainder of the exam is unchanged.  IMPRESSION: Persistent bibasilar opacification likely small effusions with atelectasis demonstrating slight interval improvement.   Electronically Signed   By: Marin Olp M.D.    On: 11/20/2013 08:13   Dg Chest Port 1 View  11/19/2013   CLINICAL DATA:  Follow up effusion.  EXAM: PORTABLE CHEST - 1 VIEW  COMPARISON:  11/12/2013 and 11/09/2013 radiographs.  CT 10/02/2013.  FINDINGS: 1658 hr. There are lower lung volumes with increased opacities at both lung bases. These are probably due to enlarging pleural effusions and associated bibasilar atelectasis. Mild enlargement of the cardiac silhouette appears stable. No residual pneumoperitoneum is evident.  IMPRESSION: Increased density at both lung bases, probably related to enlarging pleural effusions and associated bibasilar opacities. Pleural assessment is limited on this AP semi-erect view.   Electronically Signed   By: Camie Patience M.D.   On: 11/19/2013 17:26   Dg Abd Portable 2v  11/19/2013   CLINICAL DATA:  Malaise.  EXAM: PORTABLE ABDOMEN - 2 VIEW  COMPARISON:  11/12/2013 and CT chest 10/02/2013.  FINDINGS: Scattered gas in nondistended colon. No small bowel dilatation. No free air. Vascular calcifications are seen in the left upper quadrant. Scattered phleboliths. Degenerative changes are seen in the spine.  IMPRESSION: No acute findings.   Electronically Signed   By: Lorin Picket M.D.   On: 11/19/2013 17:26   Results for orders placed during the hospital encounter of 11/13/13 (from the past 72 hour(s))  GLUCOSE, CAPILLARY     Status: Abnormal   Collection Time    11/18/13 11:58 AM      Result Value Ref Range   Glucose-Capillary 117 (*) 70 - 99 mg/dL  GLUCOSE, CAPILLARY     Status: Abnormal   Collection Time    11/18/13  4:32 PM      Result Value Ref Range   Glucose-Capillary 139 (*) 70 - 99 mg/dL  GLUCOSE, CAPILLARY     Status: Abnormal   Collection Time    11/18/13  9:27 PM      Result Value Ref Range   Glucose-Capillary 142 (*) 70 - 99 mg/dL  PROTIME-INR     Status: Abnormal   Collection Time    11/19/13  4:48 AM      Result Value Ref Range   Prothrombin Time 23.8 (*) 11.6 - 15.2 seconds   INR 2.13 (*)  0.00 - 1.49  GLUCOSE, CAPILLARY     Status: Abnormal   Collection Time    11/19/13  6:38 AM      Result Value Ref Range   Glucose-Capillary 135 (*) 70 - 99 mg/dL   Comment 1 Notify RN    GLUCOSE, CAPILLARY     Status: Abnormal   Collection Time    11/19/13 11:33 AM      Result Value Ref Range   Glucose-Capillary 130 (*) 70 - 99 mg/dL   Comment 1 Notify RN    GLUCOSE, CAPILLARY     Status: Abnormal   Collection Time    11/19/13  3:26 PM      Result Value Ref Range   Glucose-Capillary 119 (*) 70 - 99 mg/dL  BLOOD GAS, ARTERIAL     Status: Abnormal   Collection Time    11/19/13  4:40 PM      Result Value Ref Range   O2 Content ROOM AIR     pH, Arterial 7.464 (*) 7.350 - 7.450   pCO2 arterial 32.9 (*) 35.0 - 45.0 mmHg   pO2, Arterial 58.3 (*) 80.0 - 100.0 mmHg   Bicarbonate 23.3  20.0 - 24.0 mEq/L   TCO2 24.3  0 - 100 mmol/L   Acid-base deficit 0.0  0.0 - 2.0 mmol/L   O2 Saturation 91.2     Patient temperature 98.6     Collection site RIGHT RADIAL     Drawn by [id number]     Sample type ARTERIAL DRAW     Allens test (pass/fail) PASS  PASS   Comment: Performed at [facility] PANEL     Status: Abnormal   Collection Time    11/19/13  4:42 PM      Result Value Ref Range   Sodium 131 (*) 137 - 147 mEq/L   Potassium 5.0  3.7 - 5.3 mEq/L   Comment: HEMOLYSIS AT THIS LEVEL MAY AFFECT RESULT   Chloride 96  96 - 112 mEq/L   CO2 22  19 - 32 mEq/L   Glucose, Bld 139 (*) 70 - 99 mg/dL   BUN 41 (*) 6 - 23 mg/dL   Creatinine, Ser 1.70 (*) 0.50 - 1.35 mg/dL   Calcium 8.9  8.4 - 10.5 mg/dL   GFR calc non Af Amer 37 (*) >90 mL/min   GFR calc Af Amer 43 (*) >90 mL/min   Comment: (NOTE)     The eGFR has been calculated using the CKD EPI equation.     This calculation has not been validated in all clinical situations.     eGFR's persistently <90 mL/min signify possible Chronic Kidney     Disease.   Anion gap 13  5 - 15  CBC WITH DIFFERENTIAL     Status:  Abnormal   Collection Time    11/19/13  4:42 PM      Result Value Ref Range   WBC 12.5 (*) 4.0 - 10.5 K/uL   RBC 3.27 (*) 4.22 - 5.81 MIL/uL   Hemoglobin 9.2 (*) 13.0 - 17.0 g/dL   HCT 28.2 (*)  39.0 - 52.0 %   MCV 86.2  78.0 - 100.0 fL   MCH 28.1  26.0 - 34.0 pg   MCHC 32.6  30.0 - 36.0 g/dL   RDW 16.3 (*) 11.5 - 15.5 %   Platelets 214  150 - 400 K/uL   Neutrophils Relative % 91 (*) 43 - 77 %   Neutro Abs 11.3 (*) 1.7 - 7.7 K/uL   Lymphocytes Relative 5 (*) 12 - 46 %   Lymphs Abs 0.7  0.7 - 4.0 K/uL   Monocytes Relative 4  3 - 12 %   Monocytes Absolute 0.5  0.1 - 1.0 K/uL   Eosinophils Relative 0  0 - 5 %   Eosinophils Absolute 0.0  0.0 - 0.7 K/uL   Basophils Relative 0  0 - 1 %   Basophils Absolute 0.0  0.0 - 0.1 K/uL  TROPONIN I     Status: None   Collection Time    11/19/13  4:43 PM      Result Value Ref Range   Troponin I <0.30  <0.30 ng/mL   Comment:            Due to the release kinetics of cTnI,     a negative result within the first hours     of the onset of symptoms does not rule out     myocardial infarction with certainty.     If myocardial infarction is still suspected,     repeat the test at appropriate intervals.  GLUCOSE, CAPILLARY     Status: Abnormal   Collection Time    11/19/13  8:39 PM      Result Value Ref Range   Glucose-Capillary 145 (*) 70 - 99 mg/dL   Comment 1 Notify RN    GLUCOSE, CAPILLARY     Status: Abnormal   Collection Time    11/20/13  7:15 AM      Result Value Ref Range   Glucose-Capillary 111 (*) 70 - 99 mg/dL  PROTIME-INR     Status: Abnormal   Collection Time    11/20/13  7:18 AM      Result Value Ref Range   Prothrombin Time 27.4 (*) 11.6 - 15.2 seconds   INR 2.55 (*) 0.00 - 2.24  BASIC METABOLIC PANEL     Status: Abnormal   Collection Time    11/20/13  7:18 AM      Result Value Ref Range   Sodium 135 (*) 137 - 147 mEq/L   Potassium 4.4  3.7 - 5.3 mEq/L   Chloride 98  96 - 112 mEq/L   CO2 25  19 - 32 mEq/L   Glucose,  Bld 109 (*) 70 - 99 mg/dL   BUN 41 (*) 6 - 23 mg/dL   Creatinine, Ser 1.83 (*) 0.50 - 1.35 mg/dL   Calcium 8.7  8.4 - 10.5 mg/dL   GFR calc non Af Amer 34 (*) >90 mL/min   GFR calc Af Amer 39 (*) >90 mL/min   Comment: (NOTE)     The eGFR has been calculated using the CKD EPI equation.     This calculation has not been validated in all clinical situations.     eGFR's persistently <90 mL/min signify possible Chronic Kidney     Disease.   Anion gap 12  5 - 15  GLUCOSE, CAPILLARY     Status: Abnormal   Collection Time    11/20/13 11:33 AM      Result Value  Ref Range   Glucose-Capillary 116 (*) 70 - 99 mg/dL   Comment 1 Notify RN    GLUCOSE, CAPILLARY     Status: Abnormal   Collection Time    11/20/13  4:25 PM      Result Value Ref Range   Glucose-Capillary 140 (*) 70 - 99 mg/dL   Comment 1 Notify RN    GLUCOSE, CAPILLARY     Status: Abnormal   Collection Time    11/20/13  9:12 PM      Result Value Ref Range   Glucose-Capillary 124 (*) 70 - 99 mg/dL   Comment 1 Notify RN    PROTIME-INR     Status: Abnormal   Collection Time    11/21/13  5:00 AM      Result Value Ref Range   Prothrombin Time 26.8 (*) 11.6 - 15.2 seconds   INR 2.48 (*) 0.00 - 9.70  BASIC METABOLIC PANEL     Status: Abnormal   Collection Time    11/21/13  5:00 AM      Result Value Ref Range   Sodium 136 (*) 137 - 147 mEq/L   Potassium 3.9  3.7 - 5.3 mEq/L   Chloride 98  96 - 112 mEq/L   CO2 29  19 - 32 mEq/L   Glucose, Bld 134 (*) 70 - 99 mg/dL   BUN 43 (*) 6 - 23 mg/dL   Creatinine, Ser 1.92 (*) 0.50 - 1.35 mg/dL   Calcium 8.8  8.4 - 10.5 mg/dL   GFR calc non Af Amer 32 (*) >90 mL/min   GFR calc Af Amer 37 (*) >90 mL/min   Comment: (NOTE)     The eGFR has been calculated using the CKD EPI equation.     This calculation has not been validated in all clinical situations.     eGFR's persistently <90 mL/min signify possible Chronic Kidney     Disease.   Anion gap 9  5 - 15  GLUCOSE, CAPILLARY     Status:  Abnormal   Collection Time    11/21/13  6:52 AM      Result Value Ref Range   Glucose-Capillary 116 (*) 70 - 99 mg/dL     HEENT: normal Cardio: RRR Resp: CTA B/L GI: BS positive and NT, ND Extremity:  Edema R pre tib, LLE in [employer] boot Skin:   Intact and Wound UNNA boot , new no soak through, + foul odor continues, area non-tender Neuro: Alert/Oriented and Abnormal Motor 3-/5 Left HF, KE, Ankle NT secondary to UNNA, 4/5 BUE andRLE Musc/Skel:  Other No calf tenderness GEN NAD GU: scrotum swollen to size of canteloupe, nontender, no erythema--tender moreso on the left side---unchanged, penis minimally visible  Assessment/Plan: 1. Functional deficits secondary to deconditioning secondary to pericardial effusion which require 3+ hours per day of interdisciplinary therapy in a comprehensive inpatient rehab setting. Physiatrist is providing close team supervision and 24 hour management of active medical problems listed below. Physiatrist and rehab team continue to assess barriers to discharge/monitor patient progress toward functional and medical goals. FIM: FIM - Bathing Bathing Steps Patient Completed: Chest;Right Arm;Left Arm;Abdomen;Front perineal area;Right upper leg;Left upper leg Bathing: 3: Mod-Patient completes 5-7 [id number] parts or 50-74%  FIM - Upper Body Dressing/Undressing Upper body dressing/undressing steps patient completed: Thread/unthread right sleeve of pullover shirt/dresss;Thread/unthread left sleeve of pullover shirt/dress;Put head through opening of pull over shirt/dress;Pull shirt over trunk Upper body dressing/undressing: 0: Wears gown/pajamas-no public clothing FIM - Lower Body  Dressing/Undressing Lower body dressing/undressing steps patient completed: Pull pants up/down Lower body dressing/undressing: 1: Total-Patient completed less than 25% of tasks (wears gown, patient total assist for shoes)  FIM - Toileting Toileting steps completed by patient: Adjust clothing  prior to toileting Toileting: 1: Total-Patient completed zero steps, helper did all 3  FIM - Radio producer Devices: Nurse, learning disability Transfers: 4-To toilet/BSC: Min A (steadying Pt. > 75%);4-From toilet/BSC: Min A (steadying Pt. > 75%)  FIM - Control and instrumentation engineer Devices: Walker;Arm rests Bed/Chair Transfer: 4: Supine > Sit: Min A (steadying Pt. > 75%/lift 1 leg);5: Sit > Supine: Supervision (verbal cues/safety issues);4: Bed > Chair or W/C: Min A (steadying Pt. > 75%);4: Chair or W/C > Bed: Min A (steadying Pt. > 75%)  FIM - Locomotion: Wheelchair Distance: 75' Locomotion: Wheelchair: 0: Activity did not occur FIM - Locomotion: Ambulation Locomotion: Ambulation Assistive Devices: Administrator Ambulation/Gait Assistance: 4: Min guard Locomotion: Ambulation: 0: Activity did not occur  Comprehension Comprehension Mode: Auditory Comprehension: 4-Understands basic 75 - 89% of the time/requires cueing 10 - 24% of the time  Expression Expression Mode: Verbal Expression: 5-Expresses basic needs/ideas: With extra time/assistive device  Social Interaction Social Interaction: 5-Interacts appropriately 90% of the time - Needs monitoring or encouragement for participation or interaction.  Problem Solving Problem Solving: 4-Solves basic 75 - 89% of the time/requires cueing 10 - 24% of the time  Memory Memory: 3-Recognizes or recalls 50 - 74% of the time/requires cueing 25 - 49% of the time  Medical Problem List and Plan:   1. Functional deficits secondary to deconditioning after pericardial effusion status post subxiphoid pericardial window 11/09/2013   2. DVT Prophylaxis/Anticoagulation: Initiation of Coumadin for LAAthrombus as well intermittent atrial fibrillation. Monitor for any bleeding episodes   3. Pain Management: Oxycodone as needed. Monitor with increased mobility   4. acute blood loss anemia. Continue  iron supplement. Followup CBC   5. Neuropsych: This patient is capable of making decisions on his own behalf.   6. Skin/Wound Care/chronic diabetic left foot ulcer.: Followup wound care nurse. Continue Haematologist. Doxycycline for wound coverage   -continue wraps,wound care 7. Diabetes mellitus with peripheral neuropathy. Latest hemoglobin A1c 5.9. Check blood sugars a.c. and at bedtime. Continue sliding scale insulin    -fair control 8. Hypertension/atrial fibrillation. Lopressor 12.5 mg twice a day, Norvasc 5 mg daily, amiodarone 400 mg daily.   - CHF---responsive to lasix yesterday, cxr with effusions---comfortable this am  -check daily weights  -continue low dose daily oral lasix today  -amiodarone decreased to 231m  -appreciate cardiology follow up 9. Chronic renal insufficiency.     -labs stable. Cr 1.92----follow up next week 10. Hyperlipidemia. Zocor   11. Uro/scrotal edema: repeat ucx neg  -rx scrotal edema with ice/elevation, scrotal sling  -continue foley cath due to difficulty voiding   -prior U/S revealed herniated bowel into left scrotum--reviewed with surgery yesterday---not a lot that can be done other than surgical repair which they are not anxious to perform unless there is an incarceration or other emergency---they will see him in the office as an outpt after dc.   LOS (Days) 8 A FACE TO FACE EVALUATION WAS PERFORMED  SWARTZ,ZACHARY T 11/21/2013, 8:36 AM

## 2013-11-21 NOTE — Progress Notes (Signed)
Physical Therapy Session Note  Patient Details  Name: Linville Buckert MRN: [id number] Date of Birth: 05-09-1933  Today's Date: 11/21/2013 PT Individual Time: 0800-0900 PT Individual Time Calculation (min): 60 min   Short Term Goals: Week 1:  PT Short Term Goal 1 (Week 1): STGs=LTGs due to ELOS  Skilled Therapeutic Interventions/Progress Updates:  1:1. Pt received semi-reclined in bed, ready for therapy. Pt verbalized feeling much better today overall, denies pain. Focus this session on functional endurance, safety during functional mobility and therex. Pt req min A for t/f sup>sit w/ HOB slightly elevated and use of bed rails, overall min guard A for all SPTs. Pt w/ excellent tolerance to ambulation 150'x2 w/ RW and min guard A, demonstrating slow but steady pace. Pt denied any s/s of lightheadedness during tx session. Pt able to propel w/c 185' w/ B UE and good pace. Pt able to safely negotiate up/down 5 steps w/ B rail and min guard A, initial min cues for step-to pattern. Pt utilized NuStep for B UE/LE strength/endurance, level 3x8 min- 1 seated rest break at . Pt w/ good tolerance overall. Pt req seated rest throughout session due to fatigue. Pt left sitting on toilet at end of session w/ nurse tech in room.  Therapy Documentation Precautions:  Precautions Precautions: Fall Precaution Comments: wears Unna boot when ambulating Restrictions Weight Bearing Restrictions: No LLE Weight Bearing: Weight bearing as tolerated   Pain: Pain Assessment Pain Assessment: No/denies pain  See FIM for current functional status  Therapy/Group: Individual Therapy  Denzil Hughes 11/21/2013, 12:11 PM

## 2013-11-21 NOTE — Progress Notes (Signed)
Social Work Patient ID: Derrell Lolling, male   DOB: 1934-03-15, 80 y.o.   MRN: [id number]  Spoke with pt's son this morning to discuss my conversation with his father yesterday regarding possible SNF placement.  Also to address report received from PA this morning that, per her discussion with son last evening, that son seemed upset with lack of information from social work and that he reported he had not spoken with me since Monday.  Also, son's concern about people talking with his father about NHP.    Clarified/ reminded son that he and I had spoken on Wednesday this week and I had provided him with team conference info and we had a lengthy discussion about need to consider SNF as pt's care needs at home are too great for pt's wife to provide.  Son very pleasant and states that he does remember this discussion ("...just couldn't remember what day it was...") and that he is agreed that SNF is the best option for pt at this time and that he was agreeable to my discussion with pt about SNF.  He clarifies that the concerns he was trying to express with nursing staff and PA yesterday were regarding multiple family members (sisters) receiving info from staff and then providing exaggerated and inaccurate information to his mother.  He explains that he would like to be primary contact and that staff allow him to relay information to pt's wife and pt's daughters.  He understands that I did speak with pt about SNF option and he is agreeable that this was done.  He notes that he also spoke with his father yesterday evening about this and received the same response as I did,  with pt tearfully stating he did not want to do this but, with further discussion, he was agreeable with plan.   Son did have pt's status changed to "XXX" and has informed his mother and sister of this and requested that they all stop making multiple phone calls to nurse's station so that information can be streamlined via son.  He has no  concerns with sisters coming to visit, however, simply requests that any medical information be given to son first so that he can speak with pt and mother.  Have explained to son that I will continue to provide support to pt in regards to plan for SNF and to stress the benefits of SNF in allowing longer period of daily rehab.  Son agreeable and appreciative.  Iva Montelongo, LCSW

## 2013-11-21 NOTE — Progress Notes (Signed)
ANTICOAGULATION CONSULT NOTE - Follow Up Consult  Pharmacy Consult for coumadin Indication: afib and atrial thrombus  No Known Allergies  Patient Measurements: Height: 5\' 7"  (170.2 cm) Weight: 181 lb 14.1 oz (82.5 kg) IBW/kg (Calculated) : 66.1 Heparin Dosing Weight:   Vital Signs: Temp: 98 F (36.7 C) (08/21 0417) Temp src: Oral (08/21 0417) BP: 136/86 mmHg (08/21 0420) Pulse Rate: 53 (08/21 0420)  Labs:  Recent Labs  11/19/13 0448 11/19/13 1642 11/19/13 1643 11/20/13 0718 11/21/13 0500  HGB  --  9.2*  --   --   --   HCT  --  28.2*  --   --   --   PLT  --  214  --   --   --   LABPROT 23.8*  --   --  27.4* 26.8*  INR 2.13*  --   --  2.55* 2.48*  CREATININE  --  1.70*  --  1.83* 1.92*  TROPONINI  --   --  <0.30  --   --     Estimated Creatinine Clearance: 32.6 ml/min (by C-G formula based on Cr of 1.92).   Medications:  Scheduled:  . amiodarone  200 mg Oral Daily  . amLODipine  2.5 mg Oral Daily  . aspirin EC  81 mg Oral Daily  . chlorthalidone  25 mg Oral Daily  . doxycycline  100 mg Oral Q12H  . feeding supplement (GLUCERNA SHAKE)  237 mL Oral BID BM  . ferrous sulfate  325 mg Oral TID WC  . furosemide  20 mg Oral Daily  . insulin aspart  0-5 Units Subcutaneous QHS  . metoprolol tartrate  12.5 mg Oral BID  . senna-docusate  1 tablet Oral BID  . simvastatin  10 mg Oral q1800  . Warfarin - Pharmacist Dosing Inpatient   Does not apply q1800   Infusions:    Assessment: 80 yo male with afib and atrial thrombus is currently on therapeutic coumadin.  INR today is 2.48.  Patient is on amiodarone and doxycyline.  Goal of Therapy:  INR 2-3 Monitor platelets by anticoagulation protocol: Yes   Plan:  1. Coumadin 1.5 mg po x 1 2. Daily INR 3. Follow up LOT for doxycycline as this is affecting INR  Vonnie Ligman, Tsz-Yin 11/21/2013,8:18 AM

## 2013-11-22 ENCOUNTER — Inpatient Hospital Stay (HOSPITAL_COMMUNITY): Payer: Medicare Other | Admitting: *Deleted

## 2013-11-22 DIAGNOSIS — I4891 Unspecified atrial fibrillation: Secondary | ICD-10-CM

## 2013-11-22 DIAGNOSIS — N183 Chronic kidney disease, stage 3 unspecified: Secondary | ICD-10-CM

## 2013-11-22 DIAGNOSIS — D638 Anemia in other chronic diseases classified elsewhere: Secondary | ICD-10-CM

## 2013-11-22 DIAGNOSIS — R5381 Other malaise: Secondary | ICD-10-CM

## 2013-11-22 DIAGNOSIS — Z5189 Encounter for other specified aftercare: Secondary | ICD-10-CM

## 2013-11-22 LAB — BASIC METABOLIC PANEL
ANION GAP: 12 (ref 5–15)
BUN: 44 mg/dL — ABNORMAL HIGH (ref 6–23)
CHLORIDE: 96 meq/L (ref 96–112)
CO2: 30 meq/L (ref 19–32)
CREATININE: 1.83 mg/dL — AB (ref 0.50–1.35)
Calcium: 8.8 mg/dL (ref 8.4–10.5)
GFR calc Af Amer: 39 mL/min — ABNORMAL LOW (ref >90)
GFR calc non Af Amer: 34 mL/min — ABNORMAL LOW (ref >90)
Glucose, Bld: 161 mg/dL — ABNORMAL HIGH (ref 70–99)
Potassium: 4.3 mEq/L (ref 3.7–5.3)
Sodium: 138 mEq/L (ref 137–147)

## 2013-11-22 LAB — GLUCOSE, CAPILLARY
GLUCOSE-CAPILLARY: 158 mg/dL — AB (ref 70–99)
GLUCOSE-CAPILLARY: 124 mg/dL — AB (ref 70–99)
Glucose-Capillary: 129 mg/dL — ABNORMAL HIGH (ref 70–99)
Glucose-Capillary: 121 mg/dL — ABNORMAL HIGH (ref 70–99)

## 2013-11-22 LAB — PROTIME-INR
INR: 3.33 — AB (ref 0.00–1.49)
Prothrombin Time: 33.8 seconds — ABNORMAL HIGH (ref 11.6–15.2)

## 2013-11-22 MED ORDER — BISACODYL 10 MG RE SUPP
10.0000 mg | Freq: Every day | RECTAL | Status: DC | PRN
  Administered 2013-11-22: 10 mg via RECTAL
  Filled 2013-11-22: qty 1

## 2013-11-22 NOTE — Progress Notes (Signed)
ANTICOAGULATION CONSULT NOTE - Follow Up Consult  Pharmacy Consult for coumadin Indication: afib and atrial thrombus  No Known Allergies  Patient Measurements: Height: 5\' 7"  (170.2 cm) Weight: 182 lb 1.6 oz (82.6 kg) IBW/kg (Calculated) : 66.1 Heparin Dosing Weight:   Vital Signs: Temp: 98.4 F (36.9 C) (08/22 0530) Temp src: Oral (08/22 0530) BP: 151/50 mmHg (08/22 0932) Pulse Rate: 53 (08/22 0530)  Labs:  Recent Labs  11/19/13 1642 11/19/13 1643 11/20/13 0718 11/21/13 0500 11/22/13 0729  HGB 9.2*  --   --   --   --   HCT 28.2*  --   --   --   --   PLT 214  --   --   --   --   LABPROT  --   --  27.4* 26.8* 33.8*  INR  --   --  2.55* 2.48* 3.33*  CREATININE 1.70*  --  1.83* 1.92* 1.83*  TROPONINI  --  <0.30  --   --   --     Estimated Creatinine Clearance: 34.2 ml/min (by C-G formula based on Cr of 1.83).   Medications:  Scheduled:  . amiodarone  200 mg Oral Daily  . amLODipine  2.5 mg Oral Daily  . aspirin EC  81 mg Oral Daily  . chlorthalidone  25 mg Oral Daily  . doxycycline  100 mg Oral Q12H  . feeding supplement (GLUCERNA SHAKE)  237 mL Oral BID BM  . ferrous sulfate  325 mg Oral TID WC  . furosemide  40 mg Oral Daily  . insulin aspart  0-5 Units Subcutaneous QHS  . metoprolol tartrate  12.5 mg Oral BID  . senna-docusate  1 tablet Oral BID  . simvastatin  10 mg Oral q1800  . Warfarin - Pharmacist Dosing Inpatient   Does not apply q1800   Infusions:    Assessment: 80 year old male on chronic anticoagulation with coumadin for atrial fibrillation and left atrial thrombus.  His INR is now supratherapeutic, likely influenced by drug interactions with amiodarone and doxycycline.   Goal of Therapy:  INR 2-3 Monitor platelets by anticoagulation protocol: Yes   Plan:  1. No Coumadin today 2. Daily INR   Sallee Provencal 11/22/2013,10:46 AM

## 2013-11-22 NOTE — Progress Notes (Signed)
Physical Therapy Session Note  Patient Details  Name: [name] MRN: [id number] Date of Birth: 06-06-1933  Today's Date: 11/22/2013 PT Individual Time: 1255-1355 PT Individual Time Calculation (min): 60 min    Skilled Therapeutic Interventions/Progress Updates:  Patient in bed at the beginning of therapy, agrees to therapy session, transfers supine to sit with min guard, extended sitting EOb due to dizziness. Transfer to w/c with min A. W/c propulsion to gym with B UE and one rest break.  NuStep x 10 min with 4 rest breaks due to fatigue,level 4. Standing balance training. Gait training with Rw 4 x 30 feet, patient needs increased breaks as he is complaining of nausea.  Patient returned to room , nursing notified.   Therapy Documentation Precautions:  Precautions Precautions: Fall Precaution Comments: wears Unna boot when ambulating Restrictions Weight Bearing Restrictions: No LLE Weight Bearing: Weight bearing as tolerated Vital Signs:   Pain:    See FIM for current functional status  Therapy/Group: Individual Therapy  Dorna Mai 11/22/2013, 3:02 PM

## 2013-11-22 NOTE — Progress Notes (Signed)
80 y.o. right-handed male with history of previous CVA with little residual, hypertension, diabetes mellitus peripheral neuropathy with chronic nonhealing foot ulcers, chronic renal insufficiency with baseline creatinine 1.56 as well as pericarditis associated with pericardial effusion which was identified during workup of acute CVA. Patient used a walker prior to admission. Wife also with history of CVA and she also uses a walker. Presented 11/02/2013 with chest tightness and palpitations. Echocardiogram with ejection fraction of 50% large pericardial effusion identified features consistent with a moderate tamponade as well as incidental findings of LAA thrombus. Underwent percutaneous pericardiocentesis 11/03/2013 per Dr. Martinique with removal proximally 750 mL of fluid. Followup echocardiogram showed resolution of pericardial effusion with echocardiogram again later completed showing some reaccumulation of fluid  Subjective/Complaints: Had a better day yesterday. Slept well. Breathing well---denies cough, sob, cp.    Objective: Vital Signs: Blood pressure 146/46, pulse 53, temperature 98.4 F (36.9 C), temperature source Oral, resp. rate 17, height _0  (1.702 m), weight 82.6 kg (182 lb 1.6 oz), SpO2 99.00%. No results found. Results for orders placed during the hospital encounter of 11/13/13 (from the past 72 hour(s))  GLUCOSE, CAPILLARY     Status: Abnormal   Collection Time    11/19/13 11:33 AM      Result Value Ref Range   Glucose-Capillary 130 (*) 70 - 99 mg/dL   Comment 1 Notify RN    GLUCOSE, CAPILLARY     Status: Abnormal   Collection Time    11/19/13  3:26 PM      Result Value Ref Range   Glucose-Capillary 119 (*) 70 - 99 mg/dL  BLOOD GAS, ARTERIAL     Status: Abnormal   Collection Time    11/19/13  4:40 PM      Result Value Ref Range   O2 Content ROOM AIR     pH, Arterial 7.464 (*) 7.350 - 7.450   pCO2 arterial 32.9 (*) 35.0 - 45.0 mmHg   pO2, Arterial 58.3 (*) 80.0 - 100.0  mmHg   Bicarbonate 23.3  20.0 - 24.0 mEq/L   TCO2 24.3  0 - 100 mmol/L   Acid-base deficit 0.0  0.0 - 2.0 mmol/L   O2 Saturation 91.2     Patient temperature 98.6     Collection site RIGHT RADIAL     Drawn by [id number]     Sample type ARTERIAL DRAW     Allens test (pass/fail) PASS  PASS   Comment: Performed at [facility] PANEL     Status: Abnormal   Collection Time    11/19/13  4:42 PM      Result Value Ref Range   Sodium 131 (*) 137 - 147 mEq/L   Potassium 5.0  3.7 - 5.3 mEq/L   Comment: HEMOLYSIS AT THIS LEVEL MAY AFFECT RESULT   Chloride 96  96 - 112 mEq/L   CO2 22  19 - 32 mEq/L   Glucose, Bld 139 (*) 70 - 99 mg/dL   BUN 41 (*) 6 - 23 mg/dL   Creatinine, Ser 1.70 (*) 0.50 - 1.35 mg/dL   Calcium 8.9  8.4 - 10.5 mg/dL   GFR calc non Af Amer 37 (*) >90 mL/min   GFR calc Af Amer 43 (*) >90 mL/min   Comment: (NOTE)     The eGFR has been calculated using the CKD EPI equation.     This calculation has not been validated in all clinical situations.     eGFR's persistently <  90 mL/min signify possible Chronic Kidney     Disease.   Anion gap 13  5 - 15  CBC WITH DIFFERENTIAL     Status: Abnormal   Collection Time    11/19/13  4:42 PM      Result Value Ref Range   WBC 12.5 (*) 4.0 - 10.5 K/uL   RBC 3.27 (*) 4.22 - 5.81 MIL/uL   Hemoglobin 9.2 (*) 13.0 - 17.0 g/dL   HCT 28.2 (*) 39.0 - 52.0 %   MCV 86.2  78.0 - 100.0 fL   MCH 28.1  26.0 - 34.0 pg   MCHC 32.6  30.0 - 36.0 g/dL   RDW 16.3 (*) 11.5 - 15.5 %   Platelets 214  150 - 400 K/uL   Neutrophils Relative % 91 (*) 43 - 77 %   Neutro Abs 11.3 (*) 1.7 - 7.7 K/uL   Lymphocytes Relative 5 (*) 12 - 46 %   Lymphs Abs 0.7  0.7 - 4.0 K/uL   Monocytes Relative 4  3 - 12 %   Monocytes Absolute 0.5  0.1 - 1.0 K/uL   Eosinophils Relative 0  0 - 5 %   Eosinophils Absolute 0.0  0.0 - 0.7 K/uL   Basophils Relative 0  0 - 1 %   Basophils Absolute 0.0  0.0 - 0.1 K/uL  TROPONIN I     Status: None    Collection Time    11/19/13  4:43 PM      Result Value Ref Range   Troponin I <0.30  <0.30 ng/mL   Comment:            Due to the release kinetics of cTnI,     a negative result within the first hours     of the onset of symptoms does not rule out     myocardial infarction with certainty.     If myocardial infarction is still suspected,     repeat the test at appropriate intervals.  GLUCOSE, CAPILLARY     Status: Abnormal   Collection Time    11/19/13  8:39 PM      Result Value Ref Range   Glucose-Capillary 145 (*) 70 - 99 mg/dL   Comment 1 Notify RN    GLUCOSE, CAPILLARY     Status: Abnormal   Collection Time    11/20/13  7:15 AM      Result Value Ref Range   Glucose-Capillary 111 (*) 70 - 99 mg/dL  PROTIME-INR     Status: Abnormal   Collection Time    11/20/13  7:18 AM      Result Value Ref Range   Prothrombin Time 27.4 (*) 11.6 - 15.2 seconds   INR 2.55 (*) 0.00 - 6.80  BASIC METABOLIC PANEL     Status: Abnormal   Collection Time    11/20/13  7:18 AM      Result Value Ref Range   Sodium 135 (*) 137 - 147 mEq/L   Potassium 4.4  3.7 - 5.3 mEq/L   Chloride 98  96 - 112 mEq/L   CO2 25  19 - 32 mEq/L   Glucose, Bld 109 (*) 70 - 99 mg/dL   BUN 41 (*) 6 - 23 mg/dL   Creatinine, Ser 1.83 (*) 0.50 - 1.35 mg/dL   Calcium 8.7  8.4 - 10.5 mg/dL   GFR calc non Af Amer 34 (*) >90 mL/min   GFR calc Af Amer 39 (*) >90 mL/min  Comment: (NOTE)     The eGFR has been calculated using the CKD EPI equation.     This calculation has not been validated in all clinical situations.     eGFR's persistently <90 mL/min signify possible Chronic Kidney     Disease.   Anion gap 12  5 - 15  GLUCOSE, CAPILLARY     Status: Abnormal   Collection Time    11/20/13 11:33 AM      Result Value Ref Range   Glucose-Capillary 116 (*) 70 - 99 mg/dL   Comment 1 Notify RN    GLUCOSE, CAPILLARY     Status: Abnormal   Collection Time    11/20/13  4:25 PM      Result Value Ref Range   Glucose-Capillary  140 (*) 70 - 99 mg/dL   Comment 1 Notify RN    GLUCOSE, CAPILLARY     Status: Abnormal   Collection Time    11/20/13  9:12 PM      Result Value Ref Range   Glucose-Capillary 124 (*) 70 - 99 mg/dL   Comment 1 Notify RN    PROTIME-INR     Status: Abnormal   Collection Time    11/21/13  5:00 AM      Result Value Ref Range   Prothrombin Time 26.8 (*) 11.6 - 15.2 seconds   INR 2.48 (*) 0.00 - 5.57  BASIC METABOLIC PANEL     Status: Abnormal   Collection Time    11/21/13  5:00 AM      Result Value Ref Range   Sodium 136 (*) 137 - 147 mEq/L   Potassium 3.9  3.7 - 5.3 mEq/L   Chloride 98  96 - 112 mEq/L   CO2 29  19 - 32 mEq/L   Glucose, Bld 134 (*) 70 - 99 mg/dL   BUN 43 (*) 6 - 23 mg/dL   Creatinine, Ser 1.92 (*) 0.50 - 1.35 mg/dL   Calcium 8.8  8.4 - 10.5 mg/dL   GFR calc non Af Amer 32 (*) >90 mL/min   GFR calc Af Amer 37 (*) >90 mL/min   Comment: (NOTE)     The eGFR has been calculated using the CKD EPI equation.     This calculation has not been validated in all clinical situations.     eGFR's persistently <90 mL/min signify possible Chronic Kidney     Disease.   Anion gap 9  5 - 15  GLUCOSE, CAPILLARY     Status: Abnormal   Collection Time    11/21/13  6:52 AM      Result Value Ref Range   Glucose-Capillary 116 (*) 70 - 99 mg/dL  GLUCOSE, CAPILLARY     Status: Abnormal   Collection Time    11/21/13 11:26 AM      Result Value Ref Range   Glucose-Capillary 129 (*) 70 - 99 mg/dL   Comment 1 Notify RN    GLUCOSE, CAPILLARY     Status: Abnormal   Collection Time    11/21/13  5:04 PM      Result Value Ref Range   Glucose-Capillary 146 (*) 70 - 99 mg/dL  GLUCOSE, CAPILLARY     Status: Abnormal   Collection Time    11/21/13  8:51 PM      Result Value Ref Range   Glucose-Capillary 134 (*) 70 - 99 mg/dL  GLUCOSE, CAPILLARY     Status: Abnormal   Collection Time    11/22/13  6:59 AM      Result Value Ref Range   Glucose-Capillary 158 (*) 70 - 99 mg/dL  PROTIME-INR      Status: Abnormal   Collection Time    11/22/13  7:29 AM      Result Value Ref Range   Prothrombin Time 33.8 (*) 11.6 - 15.2 seconds   INR 3.33 (*) 0.00 - 0.93  BASIC METABOLIC PANEL     Status: Abnormal   Collection Time    11/22/13  7:29 AM      Result Value Ref Range   Sodium 138  137 - 147 mEq/L   Potassium 4.3  3.7 - 5.3 mEq/L   Chloride 96  96 - 112 mEq/L   CO2 30  19 - 32 mEq/L   Glucose, Bld 161 (*) 70 - 99 mg/dL   BUN 44 (*) 6 - 23 mg/dL   Creatinine, Ser 1.83 (*) 0.50 - 1.35 mg/dL   Calcium 8.8  8.4 - 10.5 mg/dL   GFR calc non Af Amer 34 (*) >90 mL/min   GFR calc Af Amer 39 (*) >90 mL/min   Comment: (NOTE)     The eGFR has been calculated using the CKD EPI equation.     This calculation has not been validated in all clinical situations.     eGFR's persistently <90 mL/min signify possible Chronic Kidney     Disease.   Anion gap 12  5 - 15     HEENT: normal Cardio: RRR Resp: CTA B/L GI: BS positive and NT, ND Extremity:  Edema R pre tib, LLE in UNNA boot Skin:   Intact and Wound UNNA boot , new no soak through, + foul odor continues, area non-tender Neuro: Alert/Oriented and Abnormal Motor 3-/5 Left HF, KE, Ankle NT secondary to UNNA, 4/5 BUE andRLE Musc/Skel:  Other No calf tenderness GEN NAD GU: scrotum enlarged with bowel, nontender, no erythema--tender moreso on the left side---unchanged, penis minimally visible  Assessment/Plan: 1. Functional deficits secondary to deconditioning secondary to pericardial effusion which require 3+ hours per day of interdisciplinary therapy in a comprehensive inpatient rehab setting. Physiatrist is providing close team supervision and 24 hour management of active medical problems listed below. Physiatrist and rehab team continue to assess barriers to discharge/monitor patient progress toward functional and medical goals. FIM: FIM - Bathing Bathing Steps Patient Completed: Chest;Right Arm;Left Arm;Abdomen;Front perineal area;Right  upper leg;Left upper leg Bathing: 3: Mod-Patient completes 5-7 [id number] parts or 50-74%  FIM - Upper Body Dressing/Undressing Upper body dressing/undressing steps patient completed: Thread/unthread right sleeve of pullover shirt/dresss;Thread/unthread left sleeve of pullover shirt/dress;Put head through opening of pull over shirt/dress;Pull shirt over trunk Upper body dressing/undressing: 4: Min-Patient completed 75 plus % of tasks FIM - Lower Body Dressing/Undressing Lower body dressing/undressing steps patient completed: Pull pants up/down Lower body dressing/undressing: 1: Total-Patient completed less than 25% of tasks  FIM - Toileting Toileting steps completed by patient: Adjust clothing prior to toileting Toileting: 0: Activity did not occur  FIM - TRadio producerDevices: BNurse, learning disabilityTransfers: 0-Activity did not occur  FIM - BControl and instrumentation engineerDevices: WEnvironmental consultantArm rests Bed/Chair Transfer: 3: Supine > Sit: Mod A (lifting assist/Pt. 50-74%/lift 2 legs;3: Sit > Supine: Mod A (lifting assist/Pt. 50-74%/lift 2 legs);4: Bed > Chair or W/C: Min A (steadying Pt. > 75%);4: Chair or W/C > Bed: Min A (steadying Pt. > 75%)  FIM - Locomotion: Wheelchair Distance: 75' Locomotion: Wheelchair: 1: Total Assistance/staff pushes  wheelchair (Pt<25%) FIM - Locomotion: Ambulation Locomotion: Ambulation Assistive Devices: Walker - Rolling Ambulation/Gait Assistance: 4: Min guard;5: Supervision (min guard with fatigue) Locomotion: Ambulation: 2: Travels 50 - 149 ft with minimal assistance (Pt.>75%)  Comprehension Comprehension Mode: Auditory Comprehension: 4-Understands basic 75 - 89% of the time/requires cueing 10 - 24% of the time  Expression Expression Mode: Verbal Expression: 5-Expresses basic 90% of the time/requires cueing < 10% of the time.  Social Interaction Social Interaction: 5-Interacts appropriately 90% of the  time - Needs monitoring or encouragement for participation or interaction.  Problem Solving Problem Solving: 4-Solves basic 75 - 89% of the time/requires cueing 10 - 24% of the time  Memory Memory: 2-Recognizes or recalls 25 - 49% of the time/requires cueing 51 - 75% of the time  Medical Problem List and Plan:   1. Functional deficits secondary to deconditioning after pericardial effusion status post subxiphoid pericardial window 11/09/2013   2. DVT Prophylaxis/Anticoagulation: Initiation of Coumadin for LAAthrombus as well intermittent atrial fibrillation. Monitor for any bleeding episodes   3. Pain Management: Oxycodone as needed. Monitor with increased mobility   4. acute blood loss anemia. Continue iron supplement. Followup CBC   5. Neuropsych: This patient is capable of making decisions on his own behalf.   6. Skin/Wound Care/chronic diabetic left foot ulcer.: Followup wound care nurse. Continue Haematologist. Doxycycline for wound coverage   -continue wraps,wound care 7. Diabetes mellitus with peripheral neuropathy. Latest hemoglobin A1c 5.9. Check blood sugars a.c. and at bedtime. Continue sliding scale insulin    -fair control 8. Hypertension/atrial fibrillation. Lopressor 12.5 mg twice a day, Norvasc 5 mg daily, amiodarone 400 mg daily.   - CHF---responsive to lasix yesterday, cxr with effusions---comfortable this am  -check daily weights  -continue low dose daily oral lasix today  -amiodarone decreased to 261m  -appreciate cardiology follow up 9. Chronic renal insufficiency.     -labs stable. Cr 1.92----follow up next week 10. Hyperlipidemia. Zocor   11. Inguinal hernia into scrotum, nothing to be done other than surgical repair which they are not anxious to perform unless there is an incarceration or other emergency---they will see him in the office as an outpt after dc.   LOS (Days) 9 A FACE TO FACE EVALUATION WAS PERFORMED  KIRSTEINS,ANDREW E 11/22/2013, 8:46 AM

## 2013-11-23 ENCOUNTER — Inpatient Hospital Stay (HOSPITAL_COMMUNITY): Payer: Medicare Other | Admitting: *Deleted

## 2013-11-23 LAB — GLUCOSE, CAPILLARY
GLUCOSE-CAPILLARY: 115 mg/dL — AB (ref 70–99)
GLUCOSE-CAPILLARY: 128 mg/dL — AB (ref 70–99)
GLUCOSE-CAPILLARY: 127 mg/dL — AB (ref 70–99)
Glucose-Capillary: 148 mg/dL — ABNORMAL HIGH (ref 70–99)

## 2013-11-23 LAB — PROTIME-INR
INR: 2.46 — ABNORMAL HIGH (ref 0.00–1.49)
Prothrombin Time: 26.7 seconds — ABNORMAL HIGH (ref 11.6–15.2)

## 2013-11-23 MED ORDER — SENNOSIDES-DOCUSATE SODIUM 8.6-50 MG PO TABS
2.0000 | ORAL_TABLET | Freq: Two times a day (BID) | ORAL | Status: DC
  Administered 2013-11-23 – 2013-11-26 (×6): 2 via ORAL
  Filled 2013-11-23 (×7): qty 2

## 2013-11-23 MED ORDER — SORBITOL 70 % SOLN
960.0000 mL | TOPICAL_OIL | Freq: Once | ORAL | Status: AC
  Administered 2013-11-23: 960 mL via RECTAL
  Filled 2013-11-23: qty 240

## 2013-11-23 MED ORDER — WARFARIN SODIUM 2 MG PO TABS
2.0000 mg | ORAL_TABLET | Freq: Once | ORAL | Status: AC
  Administered 2013-11-23: 2 mg via ORAL
  Filled 2013-11-23: qty 1

## 2013-11-23 NOTE — Progress Notes (Signed)
SMOG enema given per order. Pt with hemorrhoids and tolerated procedure well. Small amount of dark brown/black stool noted with enema however most fluid came back after intial flow as clear. Pamelia Hoit

## 2013-11-23 NOTE — Progress Notes (Signed)
Physical Therapy Session Note  Patient Details  Name: [name] MRN: [id number] Date of Birth: [birth year]  Today's Date: 11/23/2013 PT Individual Time:  -      Short Term Goals: Week 1:  PT Short Term Goal 1 (Week 1): STGs=LTGs due to ELOS  Skilled Therapeutic Interventions/Progress Updates:    Patient received semi-reclined in bed. Session focused on increasing activity tolerance with functional mobility and unsupported sitting. Patient supine>sit with HOB elevated and use of bed rails with supervision. Upon sitting, patient reports dizziness which decreased after 2-3 minutes sitting EOB. Vitals taken approx 4 min after transferring to sitting; see below. RN notified of vitals. Patient still with c/o mild dizziness about 5 minutes later, but states he is able to transfer to wheelchair. Assisted patient in donning Unna boot while sitting EOB. Stand pivot transfer bed>wheelchair with RW and minA. Patient reports no increase in dizziness, but reports mild symptoms still present. Patient left sitting in wheelchair with all needs within reach, RN present to administer medication.  Therapy Documentation Precautions:  Precautions Precautions: Fall Precaution Comments: wears Radio broadcast assistant when ambulating Restrictions Weight Bearing Restrictions: No LLE Weight Bearing: Weight bearing as tolerated Other Position/Activity Restrictions: Has boot that he wears at home Vital Signs: Therapy Vitals Temp src: Oral Pulse Rate: 56 BP: 122/38 mmHg Patient Position (if appropriate): Sitting Oxygen Therapy SpO2: 96 % O2 Device: None (Room air) Pulse Oximetry Type: Intermittent Pain: Pain Assessment Pain Assessment: No/denies pain Pain Score: 0-No pain Locomotion : Ambulation Ambulation/Gait Assistance: Not tested (comment)   See FIM for current functional status  Therapy/Group: Individual Therapy  [name] [name], PT, DPT 11/23/2013, 8:54 AM

## 2013-11-23 NOTE — Progress Notes (Signed)
80 y.o. right-handed male with history of previous CVA with little residual, hypertension, diabetes mellitus peripheral neuropathy with chronic nonhealing foot ulcers, chronic renal insufficiency with baseline creatinine 1.56 as well as pericarditis associated with pericardial effusion which was identified during workup of acute CVA. Patient used a walker prior to admission. Wife also with history of CVA and she also uses a walker. Presented 11/02/2013 with chest tightness and palpitations. Echocardiogram with ejection fraction of 50% large pericardial effusion identified features consistent with a moderate tamponade as well as incidental findings of LAA thrombus. Underwent percutaneous pericardiocentesis 11/03/2013 per Dr. Martinique with removal proximally 750 mL of fluid. Followup echocardiogram showed resolution of pericardial effusion with echocardiogram again later completed showing some reaccumulation of fluid  Subjective/Complaints: Discussed with RN, required disimpaction last noc, no abd pain. Slept well. Breathing well---denies cough, sob, cp.    Objective: Vital Signs: Blood pressure 150/47, pulse 58, temperature 99.2 F (37.3 C), temperature source Oral, resp. rate 18, height _0  (1.702 m), weight 81.5 kg (179 lb 10.8 oz), SpO2 95.00%. No results found. Results for orders placed during the hospital encounter of 11/13/13 (from the past 72 hour(s))  GLUCOSE, CAPILLARY     Status: Abnormal   Collection Time    11/20/13 11:33 AM      Result Value Ref Range   Glucose-Capillary 116 (*) 70 - 99 mg/dL   Comment 1 Notify RN    GLUCOSE, CAPILLARY     Status: Abnormal   Collection Time    11/20/13  4:25 PM      Result Value Ref Range   Glucose-Capillary 140 (*) 70 - 99 mg/dL   Comment 1 Notify RN    GLUCOSE, CAPILLARY     Status: Abnormal   Collection Time    11/20/13  9:12 PM      Result Value Ref Range   Glucose-Capillary 124 (*) 70 - 99 mg/dL   Comment 1 Notify RN    PROTIME-INR      Status: Abnormal   Collection Time    11/21/13  5:00 AM      Result Value Ref Range   Prothrombin Time 26.8 (*) 11.6 - 15.2 seconds   INR 2.48 (*) 0.00 - 2.97  BASIC METABOLIC PANEL     Status: Abnormal   Collection Time    11/21/13  5:00 AM      Result Value Ref Range   Sodium 136 (*) 137 - 147 mEq/L   Potassium 3.9  3.7 - 5.3 mEq/L   Chloride 98  96 - 112 mEq/L   CO2 29  19 - 32 mEq/L   Glucose, Bld 134 (*) 70 - 99 mg/dL   BUN 43 (*) 6 - 23 mg/dL   Creatinine, Ser 1.92 (*) 0.50 - 1.35 mg/dL   Calcium 8.8  8.4 - 10.5 mg/dL   GFR calc non Af Amer 32 (*) >90 mL/min   GFR calc Af Amer 37 (*) >90 mL/min   Comment: (NOTE)     The eGFR has been calculated using the CKD EPI equation.     This calculation has not been validated in all clinical situations.     eGFR's persistently <90 mL/min signify possible Chronic Kidney     Disease.   Anion gap 9  5 - 15  GLUCOSE, CAPILLARY     Status: Abnormal   Collection Time    11/21/13  6:52 AM      Result Value Ref Range   Glucose-Capillary 116 (*)  70 - 99 mg/dL  GLUCOSE, CAPILLARY     Status: Abnormal   Collection Time    11/21/13 11:26 AM      Result Value Ref Range   Glucose-Capillary 129 (*) 70 - 99 mg/dL   Comment 1 Notify RN    GLUCOSE, CAPILLARY     Status: Abnormal   Collection Time    11/21/13  5:04 PM      Result Value Ref Range   Glucose-Capillary 146 (*) 70 - 99 mg/dL  GLUCOSE, CAPILLARY     Status: Abnormal   Collection Time    11/21/13  8:51 PM      Result Value Ref Range   Glucose-Capillary 134 (*) 70 - 99 mg/dL  GLUCOSE, CAPILLARY     Status: Abnormal   Collection Time    11/22/13  6:59 AM      Result Value Ref Range   Glucose-Capillary 158 (*) 70 - 99 mg/dL  PROTIME-INR     Status: Abnormal   Collection Time    11/22/13  7:29 AM      Result Value Ref Range   Prothrombin Time 33.8 (*) 11.6 - 15.2 seconds   INR 3.33 (*) 0.00 - 6.76  BASIC METABOLIC PANEL     Status: Abnormal   Collection Time    11/22/13   7:29 AM      Result Value Ref Range   Sodium 138  137 - 147 mEq/L   Potassium 4.3  3.7 - 5.3 mEq/L   Chloride 96  96 - 112 mEq/L   CO2 30  19 - 32 mEq/L   Glucose, Bld 161 (*) 70 - 99 mg/dL   BUN 44 (*) 6 - 23 mg/dL   Creatinine, Ser 1.83 (*) 0.50 - 1.35 mg/dL   Calcium 8.8  8.4 - 10.5 mg/dL   GFR calc non Af Amer 34 (*) >90 mL/min   GFR calc Af Amer 39 (*) >90 mL/min   Comment: (NOTE)     The eGFR has been calculated using the CKD EPI equation.     This calculation has not been validated in all clinical situations.     eGFR's persistently <90 mL/min signify possible Chronic Kidney     Disease.   Anion gap 12  5 - 15  GLUCOSE, CAPILLARY     Status: Abnormal   Collection Time    11/22/13 11:23 AM      Result Value Ref Range   Glucose-Capillary 124 (*) 70 - 99 mg/dL  GLUCOSE, CAPILLARY     Status: Abnormal   Collection Time    11/22/13  4:37 PM      Result Value Ref Range   Glucose-Capillary 129 (*) 70 - 99 mg/dL  GLUCOSE, CAPILLARY     Status: Abnormal   Collection Time    11/22/13  8:26 PM      Result Value Ref Range   Glucose-Capillary 121 (*) 70 - 99 mg/dL  PROTIME-INR     Status: Abnormal   Collection Time    11/23/13  6:20 AM      Result Value Ref Range   Prothrombin Time 26.7 (*) 11.6 - 15.2 seconds   INR 2.46 (*) 0.00 - 1.49  GLUCOSE, CAPILLARY     Status: Abnormal   Collection Time    11/23/13  7:42 AM      Result Value Ref Range   Glucose-Capillary 115 (*) 70 - 99 mg/dL   Comment 1 Notify RN  HEENT: normal Cardio: RRR Resp: CTA B/L GI: BS positive and NT, ND Extremity:  Edema R pre tib, LLE in [employer] boot Skin:   Intact and Wound UNNA boot , new no soak through, + foul odor continues, area non-tender Neuro: Alert/Oriented and Abnormal Motor 3-/5 Left HF, KE, Ankle NT secondary to UNNA, 4/5 BUE andRLE Musc/Skel:  Other No calf tenderness GEN NAD GU: scrotum enlarged with bowel, nontender, no erythema--tender moreso on the left side---unchanged, penis  minimally visible  Assessment/Plan: 1. Functional deficits secondary to deconditioning secondary to pericardial effusion which require 3+ hours per day of interdisciplinary therapy in a comprehensive inpatient rehab setting. Physiatrist is providing close team supervision and 24 hour management of active medical problems listed below. Physiatrist and rehab team continue to assess barriers to discharge/monitor patient progress toward functional and medical goals. FIM: FIM - Bathing Bathing Steps Patient Completed: Chest;Right Arm;Left Arm;Abdomen;Front perineal area;Right upper leg;Left upper leg Bathing: 3: Mod-Patient completes 5-7 [id number] parts or 50-74%  FIM - Upper Body Dressing/Undressing Upper body dressing/undressing steps patient completed: Thread/unthread right sleeve of pullover shirt/dresss;Thread/unthread left sleeve of pullover shirt/dress;Put head through opening of pull over shirt/dress;Pull shirt over trunk Upper body dressing/undressing: 4: Min-Patient completed 75 plus % of tasks FIM - Lower Body Dressing/Undressing Lower body dressing/undressing steps patient completed: Pull pants up/down Lower body dressing/undressing: 1: Total-Patient completed less than 25% of tasks  FIM - Toileting Toileting steps completed by patient: Adjust clothing prior to toileting Toileting: 0: Activity did not occur  FIM - TRadio producerDevices: GProduct managerTransfers: 0-Activity did not occur  FIM - BControl and instrumentation engineerDevices: WEnvironmental consultantArm rests Bed/Chair Transfer: 3: Supine > Sit: Mod A (lifting assist/Pt. 50-74%/lift 2 legs;3: Sit > Supine: Mod A (lifting assist/Pt. 50-74%/lift 2 legs);4: Bed > Chair or W/C: Min A (steadying Pt. > 75%);4: Chair or W/C > Bed: Min A (steadying Pt. > 75%)  FIM - Locomotion: Wheelchair Distance: 75' Locomotion: Wheelchair: 1: Total Assistance/staff pushes wheelchair (Pt<25%) FIM - Locomotion:  Ambulation Locomotion: Ambulation Assistive Devices: WAdministratorAmbulation/Gait Assistance: 4: Min guard;5: Supervision (min guard with fatigue) Locomotion: Ambulation: 2: Travels 50 - 149 ft with minimal assistance (Pt.>75%)  Comprehension Comprehension Mode: Auditory Comprehension: 4-Understands basic 75 - 89% of the time/requires cueing 10 - 24% of the time  Expression Expression Mode: Verbal Expression: 5-Expresses basic 90% of the time/requires cueing < 10% of the time.  Social Interaction Social Interaction: 5-Interacts appropriately 90% of the time - Needs monitoring or encouragement for participation or interaction.  Problem Solving Problem Solving: 4-Solves basic 75 - 89% of the time/requires cueing 10 - 24% of the time  Memory Memory: 2-Recognizes or recalls 25 - 49% of the time/requires cueing 51 - 75% of the time  Medical Problem List and Plan:   1. Functional deficits secondary to deconditioning after pericardial effusion status post subxiphoid pericardial window 11/09/2013   2. DVT Prophylaxis/Anticoagulation: Initiation of Coumadin for LAAthrombus as well intermittent atrial fibrillation. Monitor for any bleeding episodes   3. Pain Management: Oxycodone as needed. Monitor with increased mobility   4. acute blood loss anemia. Continue iron supplement. Followup CBC   5. Neuropsych: This patient is capable of making decisions on his own behalf.   6. Skin/Wound Care/chronic diabetic left foot ulcer.: Followup wound care nurse. Continue UHaematologist Doxycycline for wound coverage   -continue wraps,wound care 7. Diabetes mellitus with peripheral neuropathy. Latest hemoglobin A1c 5.9. Check blood sugars  a.c. and at bedtime. Continue sliding scale insulin    -fair control 8. Hypertension/atrial fibrillation. Lopressor 12.5 mg twice a day, Norvasc 5 mg daily, amiodarone 400 mg daily.   - CHF---responsive to lasix yesterday, cxr with effusions---comfortable this am  -check  daily weights  -continue low dose daily oral lasix   -amiodarone decreased to 228m  -appreciate cardiology follow up 9. Chronic renal insufficiency.     -labs stable. Cr 1.92----follow up next week 10. Hyperlipidemia. Zocor   11. Inguinal hernia into scrotum, nothing to be done other than surgical repair which they are not anxious to perform unless there is an incarceration or other emergency---they will see him in the office as an outpt after dc. 12.  Constipation increase senna to 2 tabs BID, order SMOG enema  LOS (Days) 10 A FACE TO FACE EVALUATION WAS PERFORMED  KIRSTEINS,ANDREW E 11/23/2013, 8:10 AM

## 2013-11-23 NOTE — Progress Notes (Signed)
ANTICOAGULATION CONSULT NOTE - Follow Up Consult  Pharmacy Consult for coumadin Indication: afib and atrial thrombus  No Known Allergies  Patient Measurements: Height: 5\' 7"  (170.2 cm) Weight: 179 lb 10.8 oz (81.5 kg) IBW/kg (Calculated) : 66.1 Heparin Dosing Weight:   Vital Signs: Temp: 99.2 F (37.3 C) (08/23 0602) Temp src: Oral (08/23 0602) BP: 122/38 mmHg (08/23 0854) Pulse Rate: 56 (08/23 0854)  Labs:  Recent Labs  11/21/13 0500 11/22/13 0729 11/23/13 0620  LABPROT 26.8* 33.8* 26.7*  INR 2.48* 3.33* 2.46*  CREATININE 1.92* 1.83*  --     Estimated Creatinine Clearance: 34 ml/min (by C-G formula based on Cr of 1.83).   Medications:  Scheduled:  . amiodarone  200 mg Oral Daily  . amLODipine  2.5 mg Oral Daily  . aspirin EC  81 mg Oral Daily  . chlorthalidone  25 mg Oral Daily  . doxycycline  100 mg Oral Q12H  . feeding supplement (GLUCERNA SHAKE)  237 mL Oral BID BM  . ferrous sulfate  325 mg Oral TID WC  . furosemide  40 mg Oral Daily  . insulin aspart  0-5 Units Subcutaneous QHS  . metoprolol tartrate  12.5 mg Oral BID  . senna-docusate  2 tablet Oral BID  . simvastatin  10 mg Oral q1800  . sorbitol, milk of mag, mineral oil, glycerin (SMOG) enema  960 mL Rectal Once  . Warfarin - Pharmacist Dosing Inpatient   Does not apply q1800   Infusions:    Assessment: 80 year old male on chronic anticoagulation with coumadin for atrial fibrillation and left atrial thrombus.  His INR has been supratherapeutic, likely influenced by drug interactions with amiodarone and doxycycline.  It has returned to the therapeutic range after holding Coumadin on 8/22.   Goal of Therapy:  INR 2-3 Monitor platelets by anticoagulation protocol: Yes   Plan:  1. Coumadin 2mg  today 2. Daily INR   Sallee Provencal 11/23/2013,9:41 AM

## 2013-11-24 ENCOUNTER — Inpatient Hospital Stay (HOSPITAL_COMMUNITY): Payer: Medicare Other

## 2013-11-24 ENCOUNTER — Encounter: Payer: Self-pay | Admitting: Neurology

## 2013-11-24 ENCOUNTER — Inpatient Hospital Stay (HOSPITAL_COMMUNITY): Payer: Medicare Other | Admitting: Physical Therapy

## 2013-11-24 DIAGNOSIS — N183 Chronic kidney disease, stage 3 unspecified: Secondary | ICD-10-CM

## 2013-11-24 DIAGNOSIS — Z5189 Encounter for other specified aftercare: Secondary | ICD-10-CM

## 2013-11-24 DIAGNOSIS — R5381 Other malaise: Secondary | ICD-10-CM

## 2013-11-24 DIAGNOSIS — I4891 Unspecified atrial fibrillation: Secondary | ICD-10-CM

## 2013-11-24 DIAGNOSIS — D638 Anemia in other chronic diseases classified elsewhere: Secondary | ICD-10-CM

## 2013-11-24 LAB — PROTIME-INR
INR: 1.96 — ABNORMAL HIGH (ref 0.00–1.49)
Prothrombin Time: 22.3 seconds — ABNORMAL HIGH (ref 11.6–15.2)

## 2013-11-24 LAB — GLUCOSE, CAPILLARY
GLUCOSE-CAPILLARY: 146 mg/dL — AB (ref 70–99)
Glucose-Capillary: 117 mg/dL — ABNORMAL HIGH (ref 70–99)
Glucose-Capillary: 145 mg/dL — ABNORMAL HIGH (ref 70–99)
Glucose-Capillary: 146 mg/dL — ABNORMAL HIGH (ref 70–99)

## 2013-11-24 MED ORDER — WARFARIN SODIUM 1 MG PO TABS
1.5000 mg | ORAL_TABLET | Freq: Once | ORAL | Status: AC
  Administered 2013-11-24: 1.5 mg via ORAL
  Filled 2013-11-24 (×2): qty 1

## 2013-11-24 NOTE — Progress Notes (Signed)
Physical Therapy Weekly Progress Note  Patient Details  Name: [name] MRN: [id number] Date of Birth: 08/30/1933  Beginning of progress report period: November 14, 2013 End of progress report period: November 24, 2013   Patient has met 0 of 9 long term goals.  At eval pt was estimated for length of stay of 7-10 days, STG not set. Pt's participation in therapy has been limited due to medical issues including orthostatic hypotension, scrotal edema, increased confusion, episodes of diophoresis.  Patient continues to demonstrate the following deficits: impaired balance, decreased activity tolerance, decreased skin integrity and therefore will continue to benefit from skilled PT intervention to enhance overall performance with activity tolerance, balance and ability to compensate for deficits.  See Patient's Care Plan for progression toward long term goals.  Patient not progressing toward long term goals. Due to medical issues and lack of physical assist available at home, pt is likely a SNF candidate. Pt's goals modified to overall S-MinA  Skilled Therapeutic Interventions/Progress Updates:    Pt unable to participate with this therapist during scheduled AM or PM sessions. See notes for further details.   See FIM for current functional status    ,   , PT, DPT 11/24/2013, 3:30 PM   

## 2013-11-24 NOTE — Progress Notes (Addendum)
  ANTICOAGULATION CONSULT NOTE - Follow Up Consult  Pharmacy Consult for coumadin Indication: afib and atrial thrombus  No Known Allergies  Patient Measurements: Height: 5\' 7"  (170.2 cm) Weight: 179 lb 0.2 oz (81.2 kg) IBW/kg (Calculated) : 66.1 Heparin Dosing Weight:   Vital Signs: Temp: 98.4 F (36.9 C) (08/24 0445) Temp src: Oral (08/24 0445) BP: 149/70 mmHg (08/24 0445) Pulse Rate: 63 (08/24 0445)  Labs:  Recent Labs  11/22/13 0729 11/23/13 0620 11/24/13 0700  LABPROT 33.8* 26.7* 22.3*  INR 3.33* 2.46* 1.96*  CREATININE 1.83*  --   --     Estimated Creatinine Clearance: 33.9 ml/min (by C-G formula based on Cr of 1.83).   Medications:  Scheduled:  . amiodarone  200 mg Oral Daily  . amLODipine  2.5 mg Oral Daily  . aspirin EC  81 mg Oral Daily  . chlorthalidone  25 mg Oral Daily  . doxycycline  100 mg Oral Q12H  . feeding supplement (GLUCERNA SHAKE)  237 mL Oral BID BM  . ferrous sulfate  325 mg Oral TID WC  . furosemide  40 mg Oral Daily  . insulin aspart  0-5 Units Subcutaneous QHS  . metoprolol tartrate  12.5 mg Oral BID  . senna-docusate  2 tablet Oral BID  . simvastatin  10 mg Oral q1800  . Warfarin - Pharmacist Dosing Inpatient   Does not apply q1800   Infusions:    Assessment: 80 yo male with afib and atrial thrombus is currently on slightly subtherapeutic coumadin.  INR today is 1.96. Patient's on doxycyline.  Goal of Therapy:  INR 2-3 Monitor platelets by anticoagulation protocol: Yes   Plan:  - coumadin 1.5 mg po x1 -INR in am  Catlin Aycock, Tsz-Yin 11/24/2013,8:22 AM

## 2013-11-24 NOTE — Progress Notes (Signed)
Physical Therapy Note  Patient Details  Name: [name] MRN: [id number] Date of Birth: Mar 05, 1934 Today's Date: 11/24/2013    RN present on arrival to room at scheduled PT time. Pt's BP very low this PM, measured at 104/28 then 117/35. Pt reporting dizziness and lightheadedness, declined to participate in therapy this PM. Will follow up tomorrow if medically stable. Pt missed 45 minutes of scheduled therapy time.  Hosie Spangle Hosie Spangle, PT, DPT 11/24/2013, 3:42 PM

## 2013-11-24 NOTE — Consult Note (Signed)
WOC wound follow-up consult note  Reason for Consult: Drainage has leaked through compression wrap to left leg; Pt is followed by Dr Lajoyce Corners and he has ordered [facility] boots to be applied and changed Q week. Wound Measure: left plantar foot:0.2cmx.0.2cmx0.2cm; left heel: 0.1cmx0.1cmx0.3cm, fissure to left outer foot 3.5X.0.1X.1cm, red and moist.  Wound BDZ:HGDJ foot:100% pink; left heel:  Drainage: copious purulent serosanguinous drainage, with strong odor, soaked through 2 layers  Periwound: skin intact with calloused margins  Applied Aquacel over wounds to absorb drainage and provide antimicrobial benefits, Ortho tech notified to apply [employer], which is changed Q  Monday. Pt can resume follow-up with Dr Lajoyce Corners after discharge. Charlesetta Garibaldi RN, BSN, MSN-Student Please re-consult if further assistance is needed.  Thank-you,  Cammie Mcgee MSN, RN, CWOCN, [place], CNS [phone]

## 2013-11-24 NOTE — Progress Notes (Signed)
Patient Name: [name] Date of Encounter: 11/24/2013  Active Problems:   Physical deconditioning    Patient Profile: 80 yo male w/ hx peric effusion, CVA, DM, HTN, CKD, admitted 08/02 with chest pain and tamponade, 750 cc drained 08/03. Pt developed rapid afib. Fluid re-accumulated and TCTS did peric window 08/07. On coumadin for LV thrombus and atrial fib. Admitted to rehab 08/13. Cards following for volume issues.  SUBJECTIVE: No SOB with bathing today, pt says gets dizzy at times, cannot say if orthostatic or not, lasts 20 minutes or so. Has not fallen.  OBJECTIVE Filed Vitals:   11/23/13 1452 11/23/13 2000 11/23/13 2100 11/24/13 0445  BP: 148/64  136/50 149/70  Pulse: 57 51  63  Temp: 98.4 F (36.9 C)  99.4 F (37.4 C) 98.4 F (36.9 C)  TempSrc: Oral  Oral Oral  Resp: Height:      Weight:    179 lb 0.2 oz (81.2 kg)  SpO2: 96%  96% 95%    Intake/Output Summary (Last 24 hours) at 11/24/13 1013 Last data filed at 11/24/13 0900  Gross per 24 hour  Intake   1200 ml  Output   3100 ml  Net  -1900 ml   Filed Weights   11/22/13 0530 11/23/13 0612 11/24/13 0445  Weight: 182 lb 1.6 oz (82.6 kg) 179 lb 10.8 oz (81.5 kg) 179 lb 0.2 oz (81.2 kg)    PHYSICAL EXAM General: Well developed, well nourished, male in no acute distress. Head: Normocephalic, atraumatic.  Neck: Supple without bruits, JVD minimal elevation. Lungs:  Resp regular and unlabored, decreased BS bases. Heart: RRR, S1, S2, no S3, S4, or murmur; no rub. Abdomen: Soft, non-tender, non-distended, BS + x 4.  Extremities: No clubbing, cyanosis, 1+ RLE edema. LLE wrapped, not disturbed Neuro: Alert and oriented X 3. Moves all extremities spontaneously. Psych: Normal affect.  LABS: INR: Recent Labs  11/24/13 0700  INR 1.96*   Basic Metabolic Panel: Recent Labs  11/22/13 0729  NA 138  K 4.3  CL 96  CO2 30  GLUCOSE 161*  BUN 44*  CREATININE 1.83*  CALCIUM 8.8    Current  Medications:  . amiodarone  200 mg Oral Daily  . amLODipine  2.5 mg Oral Daily  . aspirin EC  81 mg Oral Daily  . chlorthalidone  25 mg Oral Daily  . doxycycline  100 mg Oral Q12H  . feeding supplement (GLUCERNA SHAKE)  237 mL Oral BID BM  . ferrous sulfate  325 mg Oral TID WC  . furosemide  40 mg Oral Daily  . insulin aspart  0-5 Units Subcutaneous QHS  . metoprolol tartrate  12.5 mg Oral BID  . senna-docusate  2 tablet Oral BID  . simvastatin  10 mg Oral q1800  . warfarin  1.5 mg Oral ONCE-1800  . Warfarin - Pharmacist Dosing Inpatient   Does not apply q1800   ASSESSMENT AND PLAN: Active Problems:   Physical deconditioning - per Rehab team.     Volume overload - weight 186 on admit to rehab, up to 190, now 179. Pt rec'd IV Lasix total 40 mg on 08/19, Lasix increased from 20 mg qd to 40 mg qd on 08/22. Follow daily weights.     CKD, stage 3 - Cr baseline seems 1.6-1.9, will recheck in am    Hx PAF - HR since admission has been mostly 50s and 60s, occasional 40s. Not on telemetry, but no high  heart rates recorded. Continue low-dose metoprolol 12.5 mg BID for now, requested pt call RN when he gets this, see if correlates w/ low HR.  Signed, Theodore Demark , PA-C 10:13 AM 11/24/2013  Addendum:  Called by RN re: nausea Pt was up to bathroom and had onset of significant nausea. Pt sat in a chair and VS were taken:  BP 104/28  Pulse 51  Temp(Src) 98.4 F (36.9 C) (Oral)  Resp 20  Ht 5\' 7"  (1.702 m)  Wt 179 lb 0.2 oz (81.2 kg)  BMI 28.03 kg/m2  SpO2 98%  Pt then put back to bed, still symptomatic, VS taken:  BP 117/35  Pulse 47  Temp(Src) 98.4 F (36.9 C) (Oral)  Resp 20  Ht 5\' 7"  (1.702 m)  Wt 179 lb 0.2 oz (81.2 kg)  BMI 28.03 kg/m2  SpO2 98%  After pt rested for a time, symptoms resolved. Repeat VS pending. MD advise on d/c metoprolol.  Theodore Demark, PA-C 11/24/2013 2:41 PM Beeper [phone]  Patient seen, examined. Available data reviewed. Agree with  findings, assessment, and plan as outlined by Theodore Demark, PA-C. The patient was independently interviewed and examined. His son is present at bedside. Exam reveals an alert, oriented gentleman in no distress. Lungs are clear. Heart is regular rate and rhythm without murmur or gallop. Abdomen is soft and nontender. There is bilateral pretibial edema noted. Considering his persistent bradycardia, think it is best to discontinue low-dose metoprolol. He also had an episode of weakness today associated with low blood pressure and heart rate. He otherwise appears to be doing well and no other changes in medications were recommended.  Tonny Bollman, M.D. 11/24/2013 4:21 PM

## 2013-11-24 NOTE — Progress Notes (Signed)
Pt sitting in chair for ~hr waiting on unna boot replacement. Reported feeling dizzy and llght headed and sick on his stomach. Tired of sitting up; makes back hurt to sit straight. Refused pain medication. See vs for changes. Assisted pt back to bed. [name]

## 2013-11-24 NOTE — Progress Notes (Signed)
Physical Therapy Note  Patient Details  Name: Jaksen Aude MRN: [id number] Date of Birth: 1933-12-11 Today's Date: 11/24/2013    At scheduled therapy time pt's RN in room w/ pt. She reported that pt was waiting on dressing change for L foot so that he could participate in therapy. Therapist returned 15 minutes later and RN was helping pt back to bed, he reported feeling dizzy/lightheaded w/ some nausea. Therapist noted some language of confusion by pt, seemed to be lethargic as well. Pt left supine in bed w/ RN present evaluating him w/ all needs within reach.  Hosie Spangle Hosie Spangle, PT, DPT 11/24/2013, 12:19 PM

## 2013-11-24 NOTE — Progress Notes (Signed)
Occupational Therapy Weekly Progress Note  Patient Details  Name: [name] MRN: [id number] Date of Birth: November 09, 1933  Beginning of progress report period: November 17, 2013 End of progress report period: November 24, 2013  Today's Date: 11/25/2013 OT Individual Time: 3013-1438 OT Individual Time Calculation (min): 45 min   Patient exhibits decline in medical status limiting endurance and ability to progress with treatment goals.  Patient continues to demonstrate the following deficits: Impaired endurance, impaired mobility, impaired cognition, impaired performance in lower body ADL and therefore will continue to benefit from skilled OT intervention to enhance overall performance with BADL.  Patient not progressing toward long term goals.  See goal revision..  Plan of care revisions: Long term goals downgraded to minimum assistance for BADL.  OT Short Term Goals Week 1:  OT Short Term Goal 1 (Week 1): STG = LTGs due to ELOS Week 2:  OT Short Term Goal 1 (Week 2): Pt will complete toileting hygiene with Mod Assist OT Short Term Goal 2 (Week 2): Pt will demo ability to use reacher to improve dressing skills OT Short Term Goal 3 (Week 2): Pt will complete transfer on/off tub bench in standard tub with Min Assist OT Short Term Goal 4 (Week 2): Pt will independently adjust lower body positioning to reduce discomfort at groin during BADL  Skilled Therapeutic Interventions/Progress Updates: ADL-retraining with focus on improved pain management during transitional movements (supine/sitting/standing), endurance, safety awareness, and transfers.   Pt received supine in bed and required extra time and physical assist to manage pain in groin (catheter or edema) during ADL this session. OT educated pt on manually moving scrotum to provide improved clearance and reduced pressure while preparing to rise from supine to sitting at edge of bed.   Pt ambulated to bathroom to urinate seated but misjudged  distance and location of toilet requiring mod assist to lower to toilet seat safely.    Pt felt pressure for BM but was unproductive and proceeded to shower with min assist to guide RW to correct position and steadying assist to initiate sitting.   Pt appeared more confused this session, requiring repeated assist with managing scrotum, and display  significant delays during bathing and dressing tasks as well as increased physical assist to don pants.   Pt left in w/c at end of session with cardiology providers examining him.      Therapy Documentation Precautions:  Precautions Precautions: Fall Precaution Comments: wears Radio broadcast assistant when ambulating Restrictions Weight Bearing Restrictions: No LLE Weight Bearing: Weight bearing as tolerated Other Position/Activity Restrictions: Has boot that he wears at home   Vital Signs: Therapy Vitals Temp: 97.9 F (36.6 C) Temp src: Oral Pulse Rate: 56 Resp: 18 BP: 145/78 mmHg Patient Position (if appropriate): Lying Oxygen Therapy SpO2: 96 % O2 Device: None (Room air)  Pain: Faces, 6/10, groin, with activity, repositioned    See FIM for current functional status  Therapy/Group: Individual Therapy  Gurshaan Matsuoka 11/25/2013, 7:08 AM

## 2013-11-24 NOTE — Progress Notes (Signed)
80 y.o. right-handed male with history of previous CVA with little residual, hypertension, diabetes mellitus peripheral neuropathy with chronic nonhealing foot ulcers, chronic renal insufficiency with baseline creatinine 1.56 as well as pericarditis associated with pericardial effusion which was identified during workup of acute CVA. Patient used a walker prior to admission. Wife also with history of CVA and she also uses a walker. Presented 11/02/2013 with chest tightness and palpitations. Echocardiogram with ejection fraction of 50% large pericardial effusion identified features consistent with a moderate tamponade as well as incidental findings of LAA thrombus. Underwent percutaneous pericardiocentesis 11/03/2013 per Dr. Martinique with removal proximally 750 mL of fluid. Followup echocardiogram showed resolution of pericardial effusion with echocardiogram again later completed showing some reaccumulation of fluid  Subjective/Complaints: Feeling better. Denies cough, sob, cp. Still has foley  Objective: Vital Signs: Blood pressure 149/70, pulse 63, temperature 98.4 F (36.9 C), temperature source Oral, resp. rate 18, height 5' 7"  (1.702 m), weight 81.2 kg (179 lb 0.2 oz), SpO2 95.00%. No results found. Results for orders placed during the hospital encounter of 11/13/13 (from the past 72 hour(s))  GLUCOSE, CAPILLARY     Status: Abnormal   Collection Time    11/21/13 11:26 AM      Result Value Ref Range   Glucose-Capillary 129 (*) 70 - 99 mg/dL   Comment 1 Notify RN    GLUCOSE, CAPILLARY     Status: Abnormal   Collection Time    11/21/13  5:04 PM      Result Value Ref Range   Glucose-Capillary 146 (*) 70 - 99 mg/dL  GLUCOSE, CAPILLARY     Status: Abnormal   Collection Time    11/21/13  8:51 PM      Result Value Ref Range   Glucose-Capillary 134 (*) 70 - 99 mg/dL  GLUCOSE, CAPILLARY     Status: Abnormal   Collection Time    11/22/13  6:59 AM      Result Value Ref Range   Glucose-Capillary  158 (*) 70 - 99 mg/dL  PROTIME-INR     Status: Abnormal   Collection Time    11/22/13  7:29 AM      Result Value Ref Range   Prothrombin Time 33.8 (*) 11.6 - 15.2 seconds   INR 3.33 (*) 0.00 - 1.15  BASIC METABOLIC PANEL     Status: Abnormal   Collection Time    11/22/13  7:29 AM      Result Value Ref Range   Sodium 138  137 - 147 mEq/L   Potassium 4.3  3.7 - 5.3 mEq/L   Chloride 96  96 - 112 mEq/L   CO2 30  19 - 32 mEq/L   Glucose, Bld 161 (*) 70 - 99 mg/dL   BUN 44 (*) 6 - 23 mg/dL   Creatinine, Ser 1.83 (*) 0.50 - 1.35 mg/dL   Calcium 8.8  8.4 - 10.5 mg/dL   GFR calc non Af Amer 34 (*) >90 mL/min   GFR calc Af Amer 39 (*) >90 mL/min   Comment: (NOTE)     The eGFR has been calculated using the CKD EPI equation.     This calculation has not been validated in all clinical situations.     eGFR's persistently <90 mL/min signify possible Chronic Kidney     Disease.   Anion gap 12  5 - 15  GLUCOSE, CAPILLARY     Status: Abnormal   Collection Time    11/22/13 11:23 AM  Result Value Ref Range   Glucose-Capillary 124 (*) 70 - 99 mg/dL  GLUCOSE, CAPILLARY     Status: Abnormal   Collection Time    11/22/13  4:37 PM      Result Value Ref Range   Glucose-Capillary 129 (*) 70 - 99 mg/dL  GLUCOSE, CAPILLARY     Status: Abnormal   Collection Time    11/22/13  8:26 PM      Result Value Ref Range   Glucose-Capillary 121 (*) 70 - 99 mg/dL  PROTIME-INR     Status: Abnormal   Collection Time    11/23/13  6:20 AM      Result Value Ref Range   Prothrombin Time 26.7 (*) 11.6 - 15.2 seconds   INR 2.46 (*) 0.00 - 1.49  GLUCOSE, CAPILLARY     Status: Abnormal   Collection Time    11/23/13  7:42 AM      Result Value Ref Range   Glucose-Capillary 115 (*) 70 - 99 mg/dL   Comment 1 Notify RN    GLUCOSE, CAPILLARY     Status: Abnormal   Collection Time    11/23/13 11:37 AM      Result Value Ref Range   Glucose-Capillary 148 (*) 70 - 99 mg/dL  GLUCOSE, CAPILLARY     Status: Abnormal    Collection Time    11/23/13  4:43 PM      Result Value Ref Range   Glucose-Capillary 128 (*) 70 - 99 mg/dL   Comment 1 Notify RN    GLUCOSE, CAPILLARY     Status: Abnormal   Collection Time    11/23/13  9:13 PM      Result Value Ref Range   Glucose-Capillary 127 (*) 70 - 99 mg/dL  PROTIME-INR     Status: Abnormal   Collection Time    11/24/13  7:00 AM      Result Value Ref Range   Prothrombin Time 22.3 (*) 11.6 - 15.2 seconds   INR 1.96 (*) 0.00 - 1.49     HEENT: normal Cardio: RRR Resp: CTA B/L GI: BS positive and NT, ND Extremity:  Edema R pre tib, LLE in UNNA boot Skin:   Intact and Wound UNNA boot , new no soak through, + foul odor continues, area non-tender Neuro: Alert/Oriented and Abnormal Motor 3-/5 Left HF, KE, Ankle NT secondary to UNNA, 4/5 BUE andRLE Musc/Skel:  Other No calf tenderness GEN NAD GU: scrotum less swollen with bowel, nontender, no erythema--penis more prominent  Assessment/Plan: 1. Functional deficits secondary to deconditioning secondary to pericardial effusion which require 3+ hours per day of interdisciplinary therapy in a comprehensive inpatient rehab setting. Physiatrist is providing close team supervision and 24 hour management of active medical problems listed below. Physiatrist and rehab team continue to assess barriers to discharge/monitor patient progress toward functional and medical goals. FIM: FIM - Bathing Bathing Steps Patient Completed: Chest;Right Arm;Left Arm;Abdomen;Front perineal area;Right upper leg;Left upper leg Bathing: 3: Mod-Patient completes 5-7 [id number] parts or 50-74%  FIM - Upper Body Dressing/Undressing Upper body dressing/undressing steps patient completed: Thread/unthread right sleeve of pullover shirt/dresss;Thread/unthread left sleeve of pullover shirt/dress;Put head through opening of pull over shirt/dress;Pull shirt over trunk Upper body dressing/undressing: 4: Min-Patient completed 75 plus % of tasks FIM - Lower  Body Dressing/Undressing Lower body dressing/undressing steps patient completed: Pull pants up/down Lower body dressing/undressing: 1: Total-Patient completed less than 25% of tasks  FIM - Toileting Toileting steps completed by patient: Adjust clothing prior  to toileting;Adjust clothing after toileting Toileting Assistive Devices: Grab bar or rail for support Toileting: 3: Mod-Patient completed 2 of 3 steps  FIM - Radio producer Devices: Product manager Transfers: 0-Activity did not occur  FIM - Control and instrumentation engineer Devices: Environmental consultant;Arm rests;HOB elevated Bed/Chair Transfer: 5: Supine > Sit: Supervision (verbal cues/safety issues);4: Bed > Chair or W/C: Min A (steadying Pt. > 75%)  FIM - Locomotion: Wheelchair Distance: 75' Locomotion: Wheelchair: 0: Activity did not occur FIM - Locomotion: Ambulation Locomotion: Ambulation Assistive Devices: Administrator Ambulation/Gait Assistance: Not tested (comment) Locomotion: Ambulation: 0: Activity did not occur  Comprehension Comprehension Mode: Auditory Comprehension: 4-Understands basic 75 - 89% of the time/requires cueing 10 - 24% of the time  Expression Expression Mode: Verbal Expression: 5-Expresses basic 90% of the time/requires cueing < 10% of the time.  Social Interaction Social Interaction: 5-Interacts appropriately 90% of the time - Needs monitoring or encouragement for participation or interaction.  Problem Solving Problem Solving: 4-Solves basic 75 - 89% of the time/requires cueing 10 - 24% of the time  Memory Memory: 2-Recognizes or recalls 25 - 49% of the time/requires cueing 51 - 75% of the time  Medical Problem List and Plan:   1. Functional deficits secondary to deconditioning after pericardial effusion status post subxiphoid pericardial window 11/09/2013   2. DVT Prophylaxis/Anticoagulation: Initiation of Coumadin for LAAthrombus as well intermittent  atrial fibrillation. Monitor for any bleeding episodes   3. Pain Management: Oxycodone as needed. Monitor with increased mobility   4. acute blood loss anemia. Continue iron supplement. Followup CBC   5. Neuropsych: This patient is capable of making decisions on his own behalf.   6. Skin/Wound Care/chronic diabetic left foot ulcer.: Followup wound care nurse. Continue Haematologist. Doxycycline for wound coverage   -continue wraps,wound care 7. Diabetes mellitus with peripheral neuropathy. Latest hemoglobin A1c 5.9. Check blood sugars a.c. and at bedtime. Continue sliding scale insulin    -fair control 8. Hypertension/atrial fibrillation. Lopressor 12.5 mg twice a day, Norvasc 5 mg daily, amiodarone 400 mg daily.   - CHF---responsive to lasix  , cxr with effusions---comfortable    -check daily weights  -continue low dose daily oral lasix   -amiodarone decreased to 264m  -appreciate cardiology follow up 9. Chronic renal insufficiency.     -labs stable. Cr 1.92----follow up this week 10. Hyperlipidemia. Zocor   11. Inguinal hernia into scrotum, nothing to be done other than surgical repair which they are not anxious to perform unless there is an incarceration or other emergency---they will see him in the office as an outpt after dc.  -will continue foley today--consider voiding trial this week. 12.  Constipation increase senna to 2 tabs BID, order SMOG enema  LOS (Days) 11 A FACE TO FACE EVALUATION WAS PERFORMED  Blessing Ozga T 11/24/2013, 8:44 AM

## 2013-11-24 NOTE — Progress Notes (Signed)
Orthopedic Tech Progress Note Patient Details:  [name] [birth year] [id number]  Ortho Devices Type of Ortho Device: Unna boot Ortho Device/Splint Location: LLE Ortho Device/Splint Interventions: Application   [name] 11/24/2013, 12:23 PM

## 2013-11-24 NOTE — Progress Notes (Signed)
Physical Therapy Session Note  Patient Details  Name: [name] MRN: [id number] Date of Birth: 1933-04-23  Today's Date: 11/24/2013 PT Individual Time: 1334-1410 PT Individual Time Calculation (min): 36 min   Short Term Goals: Week 1:  PT Short Term Goal 1 (Week 1): STGs=LTGs due to ELOS  Skilled Therapeutic Interventions/Progress Updates:   Pt received in bed; pt still slightly lethargic but willing to participate.  Donned Darco shoe and performed supine > sit EOB with min A.  Performed transfer bed > w/c stand pivot with RW and min A but max-total cues to sequence pivoting with RW to w/c and attention to objects on R.  Performed w/c mobility to the gym in controlled environment x 150' with min A for turning and obstacle negotiation secondary pt inability to see objects on the R.  In hallway continued gait training with focus on R and L lateral stepping training with RW and min A with therapist demonstrating safe sequence to pt first; pt still required total verbal and tactile cues to perform lateral stepping sequence safely.  Pt reporting need to have BM; returned to room and transferred w/c > toilet with RW with min A and max verbal cues for attention to R and for safety.  Cued pt to undo belt and pants and required mod-max A to maintain standing balance without UE support on RW secondary to multiple LOB forwards and max cues to sequence doffing pants.  Pt able to have BM and stood with min A and UE support on RW while NT performed hygiene and assisted with donning pants.  During toileting pt reporting pain in scrotum and nausea with pt spitting up small amounts of phlegm.  RN notified.  Pt transferred toilet > w/c with RW and min A; RN present at end of session to assess BP.  Pt left with RN.    Therapy Documentation Precautions:  Precautions Precautions: Fall Precaution Comments: wears Radio broadcast assistant when ambulating Restrictions Weight Bearing Restrictions: No LLE Weight Bearing: Weight  bearing as tolerated Other Position/Activity Restrictions: Has boot that he wears at home Vital Signs: Therapy Vitals Pulse Rate: 57 Resp: 20 BP: 150/53 mmHg Patient Position (if appropriate): Lying Oxygen Therapy SpO2: 99 % O2 Device: None (Room air) Pain: Pain Assessment Faces Pain Scale: Hurts little more Pain Type: Acute pain Pain Location: Scrotum Pain Orientation: Lower Pain Descriptors / Indicators: Heaviness Pain Onset: On-going Pain Intervention(s): Repositioned   See FIM for current functional status  Therapy/Group: Individual Therapy  Edman Circle [facility] 11/24/2013, 4:21 PM

## 2013-11-25 ENCOUNTER — Inpatient Hospital Stay (HOSPITAL_COMMUNITY): Payer: Medicare Other

## 2013-11-25 DIAGNOSIS — I4891 Unspecified atrial fibrillation: Secondary | ICD-10-CM

## 2013-11-25 DIAGNOSIS — N183 Chronic kidney disease, stage 3 unspecified: Secondary | ICD-10-CM

## 2013-11-25 DIAGNOSIS — R5381 Other malaise: Secondary | ICD-10-CM

## 2013-11-25 DIAGNOSIS — D638 Anemia in other chronic diseases classified elsewhere: Secondary | ICD-10-CM

## 2013-11-25 DIAGNOSIS — Z5189 Encounter for other specified aftercare: Secondary | ICD-10-CM

## 2013-11-25 LAB — PROTIME-INR
INR: 2.15 — ABNORMAL HIGH (ref 0.00–1.49)
PROTHROMBIN TIME: 24 s — AB (ref 11.6–15.2)

## 2013-11-25 LAB — GLUCOSE, CAPILLARY
Glucose-Capillary: 125 mg/dL — ABNORMAL HIGH (ref 70–99)
Glucose-Capillary: 119 mg/dL — ABNORMAL HIGH (ref 70–99)
Glucose-Capillary: 133 mg/dL — ABNORMAL HIGH (ref 70–99)
Glucose-Capillary: 137 mg/dL — ABNORMAL HIGH (ref 70–99)

## 2013-11-25 MED ORDER — WARFARIN SODIUM 1 MG PO TABS
1.5000 mg | ORAL_TABLET | Freq: Once | ORAL | Status: AC
  Administered 2013-11-25: 1.5 mg via ORAL
  Filled 2013-11-25: qty 1

## 2013-11-25 NOTE — Progress Notes (Signed)
ANTICOAGULATION CONSULT NOTE - Follow Up Consult  Pharmacy Consult for coumadin Indication: afib and atrial thrombus  No Known Allergies  Patient Measurements: Height: 5\' 7"  (170.2 cm) Weight: 181 lb 3.5 oz (82.2 kg) IBW/kg (Calculated) : 66.1 Heparin Dosing Weight:   Vital Signs: Temp: 97.9 F (36.6 C) (08/25 0500) Temp src: Oral (08/25 0500) BP: 138/47 mmHg (08/25 0815) Pulse Rate: 56 (08/25 0500)  Labs:  Recent Labs  11/23/13 0620 11/24/13 0700 11/25/13 0649  LABPROT 26.7* 22.3* 24.0*  INR 2.46* 1.96* 2.15*    Estimated Creatinine Clearance: 34.1 ml/min (by C-G formula based on Cr of 1.83).   Medications:  Scheduled:  . amiodarone  200 mg Oral Daily  . amLODipine  2.5 mg Oral Daily  . aspirin EC  81 mg Oral Daily  . chlorthalidone  25 mg Oral Daily  . doxycycline  100 mg Oral Q12H  . feeding supplement (GLUCERNA SHAKE)  237 mL Oral BID BM  . ferrous sulfate  325 mg Oral TID WC  . furosemide  40 mg Oral Daily  . insulin aspart  0-5 Units Subcutaneous QHS  . senna-docusate  2 tablet Oral BID  . simvastatin  10 mg Oral q1800  . Warfarin - Pharmacist Dosing Inpatient   Does not apply q1800   Infusions:    Assessment: 80 yo male with afib and atrial thrombus is currently on therapeutic coumadin.  INR today is 2.15.  Patient is on doxycyline and amiodarone which can affect INR.  Goal of Therapy:  INR 2-3 Monitor platelets by anticoagulation protocol: Yes   Plan:  - coumadin 1.5mg  po x1 - INR in am  Oval Cavazos, Tsz-Yin 11/25/2013,8:17 AM

## 2013-11-25 NOTE — Progress Notes (Signed)
Occupational Therapy Session Note  Patient Details  Name: [name] MRN: [id number] Date of Birth: [birth year]  Today's Date: 11/25/2013 OT Individual Time: 8288-3374 OT Individual Time Calculation (min): 62 min   Short Term Goals: Week 2:  OT Short Term Goal 1 (Week 2): Pt will complete toileting hygiene with Mod Assist OT Short Term Goal 2 (Week 2): Pt will demo ability to use reacher to improve dressing skills OT Short Term Goal 3 (Week 2): Pt will complete transfer on/off tub bench in standard tub with Min Assist OT Short Term Goal 4 (Week 2): Pt will independently adjust lower body positioning to reduce discomfort at groin during BADL  Skilled Therapeutic Interventions/Progress Updates: ADL-retraining with focus on improved endurance, transfers, and adapted bathing/dressing skills.   Pt received supine in bed and declined bathing this session d/t fatigue.   With extra time and remotivation provided, pt agreed to upper body bathing/dressing and grooming at sink.   Orthostatics assessed revealing continued inadequate diastolic pressure in all positions (supine, sitting, standing) with symptoms of increased confusion as evidenced by impaired initiation and sequencing, delayed processing, impaired memory, and impaired perceptual (topographic: misjudging distances).    Pt completed bed mobility unassisted but required close supervision and tactile cues to reposition from RW to w/c to find w/c seat.   Pt bathed upper body seated in w/c and completed 50% of shaving (applied cream) with max assist to shave stubble.   Pt requests assist to shave but states he has a new electric razor at home and plans to use it as feasible.    Pt donned shirt with setup assist and returned to bed at end of session with max assist to doff shoe and orthotic.   Call light and phone placed within reach.   Therapy Documentation Precautions:  Precautions Precautions: Fall Precaution Comments: wears Radio broadcast assistant when  ambulating Restrictions Weight Bearing Restrictions: No LLE Weight Bearing: Weight bearing as tolerated Other Position/Activity Restrictions: Has boot that he wears at home  Vital Signs: Therapy Vitals BP: 138/47 mmHg  Pain: Pain Assessment Pain Assessment: No/denies pain Pain Score: 0-No pain  See FIM for current functional status  Therapy/Group: Individual Therapy  Darice Vicario 11/25/2013, 10:55 AM

## 2013-11-25 NOTE — Patient Care Conference (Signed)
Inpatient RehabilitationTeam Conference and Plan of Care Update Date: 11/25/2013   Time: 2:30 PM    Patient Name: [name]      Medical Record Number: [id number]  Date of Birth: November 10, 1933 Sex: Male         Room/Bed: 4W22C/4W22C-01 Payor Info: Payor: MEDICARE / Plan: MEDICARE PART A AND B / Product Type: *No Product type* /    Admitting Diagnosis: deconditioning sp pericardial effusion no SLP  Admit Date/Time:  11/13/2013  6:51 PM Admission Comments: No comment available   Primary Diagnosis:  <principal problem not specified> Principal Problem: <principal problem not specified>  Patient Active Problem List   Diagnosis Date Noted  . Physical deconditioning 11/13/2013  . Atrial fibrillation with rapid ventricular response 11/05/2013  . Chest pain 11/02/2013  . Ulcer of foot, chronic 11/02/2013  . Thrombocytopenia 11/02/2013  . Diarrhea 11/02/2013  . Scrotal edema 11/02/2013  . PAD (peripheral artery disease) 10/02/2013  . Pericardial effusion 10/02/2013  . Decreased cardiac ejection fraction 10/02/2013  . Drug allergy 10/02/2013  . CVA (cerebral infarction) 10/01/2013  . Orthostatic dizziness 09/30/2013  . Anemia of chronic disease 09/30/2013  . CKD (chronic kidney disease) stage 3, GFR 30-59 ml/min 08/01/2012  . Anemia 08/01/2012  . Leg edema, left 08/01/2012  . Unresponsive episode 07/26/2012  . Hypoglycemia 07/26/2012  . Troponin I above reference range 07/26/2012  . At high risk for falls 07/26/2012  . Diabetes mellitus without complication   . Hypertension     Expected Discharge Date: Expected Discharge Date:  (SNF)  Team Members Present: Physician leading conference: Dr. Faith Rogue Social Worker Present: Amada Jupiter, LCSW Nurse Present: Carlean Purl, RN PT Present: Edman Circle, PT;Jess Quinn Plowman, PT OT Present: Donzetta Kohut, OT SLP Present: Jackalyn Lombard, SLP PPS Coordinator present : Tora Duck, RN, CRRN     Current Status/Progress  Goal Weekly Team Focus  Medical   slow progress. medicall stable although team reports impairments in memory and follow through and stamina  see prior  voiding trial, cognitive focus?   Bowel/Bladder   Cont. bowel Lbm 11/24/13, coude cath in place    RT remaon continent of bowel and be continent of bladder  Assess needs for coude cath q shift   Swallow/Nutrition/ Hydration             ADL's   Mod assist for Lower BADL and toileting, supervision for upper body ADL and transfers  Overall Min Assist for BADL and transfers  Improved endurance, safety awareness, memory, transfers, AE training   Mobility   MinA for bed mobility, MinA for short distance ambulation, limited by medical issues  S for bed mobility, transfers, w/c propulsion, MinA for ambulation  upright tolerance, endurance   Communication             Safety/Cognition/ Behavioral Observations            Pain   No c/o pain   no c/o pain   Monitor and treat pain q shift   Skin    Diabetic ulcer L heel Treated by [facility] 8/24, new dressing applied; dermabond to mid chest,   no new skin breakdown  assess skin q shift and prn    Rehab Goals Patient on target to meet rehab goals: No *See Care Plan and progress notes for long and short-term goals.  Barriers to Discharge: poor stamina, memory, cognition    Possible Resolutions to Barriers:  supervision at home. SNF placement    Discharge Planning/Teaching  Needs:  Plan has changed to SNF placement as family cannot provide needed assist      Team Discussion:  May trial with foley out.  Overall decline in mentation this past week?  Requiring mover verbal cueing for tasks.  Plan changed to SNF per SW.  Revisions to Treatment Plan:  Overall goals downgraded to min assist   Continued Need for Acute Rehabilitation Level of Care: The patient requires daily medical management by a physician with specialized training in physical medicine and rehabilitation for the following  conditions: Daily direction of a multidisciplinary physical rehabilitation program to ensure safe treatment while eliciting the highest outcome that is of practical value to the patient.: Yes Daily medical management of patient stability for increased activity during participation in an intensive rehabilitation regime.: Yes Daily analysis of laboratory values and/or radiology reports with any subsequent need for medication adjustment of medical intervention for : Pulmonary problems;Neurological problems;Other  Yesly Gerety 11/25/2013, 4:25 PM

## 2013-11-25 NOTE — Progress Notes (Signed)
80 y.o. right-handed male with history of previous CVA with little residual, hypertension, diabetes mellitus peripheral neuropathy with chronic nonhealing foot ulcers, chronic renal insufficiency with baseline creatinine 1.56 as well as pericarditis associated with pericardial effusion which was identified during workup of acute CVA. Patient used a walker prior to admission. Wife also with history of CVA and she also uses a walker. Presented 11/02/2013 with chest tightness and palpitations. Echocardiogram with ejection fraction of 50% large pericardial effusion identified features consistent with a moderate tamponade as well as incidental findings of LAA thrombus. Underwent percutaneous pericardiocentesis 11/03/2013 per Dr. [place] with removal proximally 750 mL of fluid. Followup echocardiogram showed resolution of pericardial effusion with echocardiogram again later completed showing some reaccumulation of fluid  Subjective/Complaints: Feeling well. Denies sob, cough, dizziness, scrotal pain. DBP low this am---?validity  Objective: Vital Signs: Blood pressure 138/47, pulse 56, temperature 97.9 F (36.6 C), temperature source Oral, resp. rate 18, height  (1.702 m), weight 82.2 kg (181 lb 3.5 oz), SpO2 96.00%. No results found. Results for orders placed during the hospital encounter of 11/13/13 (from the past 72 hour(s))  GLUCOSE, CAPILLARY     Status: Abnormal   Collection Time    11/22/13 11:23 AM      Result Value Ref Range   Glucose-Capillary 124 (*) 70 - 99 mg/dL  GLUCOSE, CAPILLARY     Status: Abnormal   Collection Time    11/22/13  4:37 PM      Result Value Ref Range   Glucose-Capillary 129 (*) 70 - 99 mg/dL  GLUCOSE, CAPILLARY     Status: Abnormal   Collection Time    11/22/13  8:26 PM      Result Value Ref Range   Glucose-Capillary 121 (*) 70 - 99 mg/dL  PROTIME-INR     Status: Abnormal   Collection Time    11/23/13  6:20 AM      Result Value Ref Range   Prothrombin Time  26.7 (*) 11.6 - 15.2 seconds   INR 2.46 (*) 0.00 - 1.49  GLUCOSE, CAPILLARY     Status: Abnormal   Collection Time    11/23/13  7:42 AM      Result Value Ref Range   Glucose-Capillary 115 (*) 70 - 99 mg/dL   Comment 1 Notify RN    GLUCOSE, CAPILLARY     Status: Abnormal   Collection Time    11/23/13 11:37 AM      Result Value Ref Range   Glucose-Capillary 148 (*) 70 - 99 mg/dL  GLUCOSE, CAPILLARY     Status: Abnormal   Collection Time    11/23/13  4:43 PM      Result Value Ref Range   Glucose-Capillary 128 (*) 70 - 99 mg/dL   Comment 1 Notify RN    GLUCOSE, CAPILLARY     Status: Abnormal   Collection Time    11/23/13  9:13 PM      Result Value Ref Range   Glucose-Capillary 127 (*) 70 - 99 mg/dL  PROTIME-INR     Status: Abnormal   Collection Time    11/24/13  7:00 AM      Result Value Ref Range   Prothrombin Time 22.3 (*) 11.6 - 15.2 seconds   INR 1.96 (*) 0.00 - 1.49  GLUCOSE, CAPILLARY     Status: Abnormal   Collection Time    11/24/13  7:26 AM      Result Value Ref Range   Glucose-Capillary 117 (*) 70 -  99 mg/dL  GLUCOSE, CAPILLARY     Status: Abnormal   Collection Time    11/24/13 11:39 AM      Result Value Ref Range   Glucose-Capillary 145 (*) 70 - 99 mg/dL  GLUCOSE, CAPILLARY     Status: Abnormal   Collection Time    11/24/13  4:29 PM      Result Value Ref Range   Glucose-Capillary 146 (*) 70 - 99 mg/dL  GLUCOSE, CAPILLARY     Status: Abnormal   Collection Time    11/24/13  8:24 PM      Result Value Ref Range   Glucose-Capillary 146 (*) 70 - 99 mg/dL  PROTIME-INR     Status: Abnormal   Collection Time    11/25/13  6:49 AM      Result Value Ref Range   Prothrombin Time 24.0 (*) 11.6 - 15.2 seconds   INR 2.15 (*) 0.00 - 1.49  GLUCOSE, CAPILLARY     Status: Abnormal   Collection Time    11/25/13  6:59 AM      Result Value Ref Range   Glucose-Capillary 125 (*) 70 - 99 mg/dL     HEENT: normal Cardio: RRR Resp: CTA B/L GI: BS positive and NT,  ND Extremity:  Edema R pre tib, LLE in UNNA boot Skin:   Intact and Wound UNNA boot , new no soak through, + foul odor continues, area non-tender Neuro: Alert/Oriented and Abnormal Motor 3-/5 Left HF, KE, Ankle NT secondary to UNNA, 4/5 BUE andRLE Musc/Skel:  Other No calf tenderness GEN NAD GU: scrotum less swollen with bowel, nontender, no erythema--penis more prominent  Assessment/Plan: 1. Functional deficits secondary to deconditioning secondary to pericardial effusion which require 3+ hours per day of interdisciplinary therapy in a comprehensive inpatient rehab setting. Physiatrist is providing close team supervision and 24 hour management of active medical problems listed below. Physiatrist and rehab team continue to assess barriers to discharge/monitor patient progress toward functional and medical goals. FIM: FIM - Bathing Bathing Steps Patient Completed: Chest;Right Arm;Left Arm;Abdomen Bathing: 3: Mod-Patient completes 5-7 16f 10 parts or 50-74%  FIM - Upper Body Dressing/Undressing Upper body dressing/undressing steps patient completed: Thread/unthread right sleeve of pullover shirt/dresss;Thread/unthread left sleeve of pullover shirt/dress;Put head through opening of pull over shirt/dress;Pull shirt over trunk Upper body dressing/undressing: 5: Set-up assist to: Obtain clothing/put away FIM - Lower Body Dressing/Undressing Lower body dressing/undressing steps patient completed: Pull pants up/down;Fasten/unfasten pants Lower body dressing/undressing: 1: Total-Patient completed less than 25% of tasks  FIM - Toileting Toileting steps completed by patient: Adjust clothing prior to toileting Toileting Assistive Devices: Grab bar or rail for support Toileting: 2: Max-Patient completed 1 of 3 steps  FIM - Diplomatic Services operational officer Devices: Best boy Transfers: 4-To toilet/BSC: Min A (steadying Pt. > 75%);4-From toilet/BSC: Min A (steadying Pt. >  75%)  FIM - Bed/Chair Transfer Bed/Chair Transfer Assistive Devices: Therapist, occupational: 4: Supine > Sit: Min A (steadying Pt. > 75%/lift 1 leg);4: Bed > Chair or W/C: Min A (steadying Pt. > 75%);4: Chair or W/C > Bed: Min A (steadying Pt. > 75%)  FIM - Locomotion: Wheelchair Distance: 150 Locomotion: Wheelchair: 4: Travels 150 ft or more: maneuvers on rugs and over door sillls with minimal assistance (Pt.>75%) FIM - Locomotion: Ambulation Locomotion: Ambulation Assistive Devices: Designer, industrial/product Ambulation/Gait Assistance: 4: Min assist Locomotion: Ambulation: 1: Travels less than 50 ft with minimal assistance (Pt.>75%)  Comprehension Comprehension Mode: Auditory Comprehension: 4-Understands basic  75 - 89% of the time/requires cueing 10 - 24% of the time  Expression Expression Mode: Verbal Expression: 4-Expresses basic 75 - 89% of the time/requires cueing 10 - 24% of the time. Needs helper to occlude trach/needs to repeat words.  Social Interaction Social Interaction: 5-Interacts appropriately 90% of the time - Needs monitoring or encouragement for participation or interaction.  Problem Solving Problem Solving: 3-Solves basic 50 - 74% of the time/requires cueing 25 - 49% of the time  Memory Memory: 2-Recognizes or recalls 25 - 49% of the time/requires cueing 51 - 75% of the time  Medical Problem List and Plan:   1. Functional deficits secondary to deconditioning after pericardial effusion status post subxiphoid pericardial window 11/09/2013   2. DVT Prophylaxis/Anticoagulation: Initiation of Coumadin for LAAthrombus as well intermittent atrial fibrillation. Monitor for any bleeding episodes   3. Pain Management: Oxycodone as needed. Monitor with increased mobility   4. acute blood loss anemia. Continue iron supplement. Followup CBC   5. Neuropsych: This patient is capable of making decisions on his own behalf.   6. Skin/Wound Care/chronic diabetic left foot ulcer.:  Followup wound care nurse. Continue Radio broadcast assistant. Doxycycline for wound coverage   -continue wraps,wound care 7. Diabetes mellitus with peripheral neuropathy. Latest hemoglobin A1c 5.9. Check blood sugars a.c. and at bedtime. Continue sliding scale insulin    -fair control 8. Hypertension/atrial fibrillation. Lopressor 12.5 mg twice a day, Norvasc 2.5 mg daily, amiodarone 400 mg daily.   - CHF---responsive to lasix  , cxr with effusions---comfortable    -check daily weights  -continue low dose daily oral lasix   -amiodarone decreased to   -appreciate cardiology follow up  -watch low DBP 9. Chronic renal insufficiency.     -labs stable. Cr 1.92----follow up thursday 10. Hyperlipidemia. Zocor   11. Inguinal hernia into scrotum, nothing to be done other than surgical repair which they are not anxious to perform unless there is an incarceration or other emergency---they will see him in the office as an outpt after dc.  -will continue foley for now--consider voiding trial later this week.(d/w team today) 12.  Constipation increase senna to 2 tabs BID, order SMOG enema  LOS (Days) 12 A FACE TO FACE EVALUATION WAS PERFORMED  Mariella Blackwelder T 11/25/2013, 8:32 AM

## 2013-11-25 NOTE — Progress Notes (Signed)
Social Work Patient ID: [name], male   DOB: [birth year], 80 y.o.   MRN: [id number]  Amada Jupiter, LCSW Social Worker Signed  Patient Care Conference Service date: 11/25/2013 4:25 PM  Inpatient RehabilitationTeam Conference and Plan of Care Update Date: 11/25/2013   Time: 2:30 PM     Patient Name: [name]       Medical Record Number: [id number]   Date of Birth: Apr 07, 1933 Sex: Male         Room/Bed: 4W22C/4W22C-01 Payor Info: Payor: MEDICARE / Plan: MEDICARE PART A AND B / Product Type: *No Product type* /   Admitting Diagnosis: deconditioning sp pericardial effusion no SLP   Admit Date/Time:  11/13/2013  6:51 PM Admission Comments: No comment available   Primary Diagnosis:  <principal problem not specified> Principal Problem: <principal problem not specified>    Patient Active Problem List     Diagnosis  Date Noted   .  Physical deconditioning  11/13/2013   .  Atrial fibrillation with rapid ventricular response  11/05/2013   .  Chest pain  11/02/2013   .  Ulcer of foot, chronic  11/02/2013   .  Thrombocytopenia  11/02/2013   .  Diarrhea  11/02/2013   .  Scrotal edema  11/02/2013   .  PAD (peripheral artery disease)  10/02/2013   .  Pericardial effusion  10/02/2013   .  Decreased cardiac ejection fraction  10/02/2013   .  Drug allergy  10/02/2013   .  CVA (cerebral infarction)  10/01/2013   .  Orthostatic dizziness  09/30/2013   .  Anemia of chronic disease  09/30/2013   .  CKD (chronic kidney disease) stage 3, GFR 30-59 ml/min  08/01/2012   .  Anemia  08/01/2012   .  Leg edema, left  08/01/2012   .  Unresponsive episode  07/26/2012   .  Hypoglycemia  07/26/2012   .  Troponin I above reference range  07/26/2012   .  At high risk for falls  07/26/2012   .  Diabetes mellitus without complication     .  Hypertension       Expected Discharge Date: Expected Discharge Date:  (SNF)  Team Members Present: Physician leading conference: Dr. Faith Rogue Social Worker  Present: Amada Jupiter, LCSW Nurse Present: Carlean Purl, RN PT Present: Edman Circle, PT;Jess Quinn Plowman, PT OT Present: Donzetta Kohut, OT SLP Present: Jackalyn Lombard, SLP PPS Coordinator present : Tora Duck, RN, CRRN        Current Status/Progress  Goal  Weekly Team Focus   Medical     slow progress. medicall stable although team reports impairments in memory and follow through and stamina  see prior  voiding trial, cognitive focus?   Bowel/Bladder     Cont. bowel Lbm 11/24/13, coude cath in place    RT remaon continent of bowel and be continent of bladder  Assess needs for coude cath q shift   Swallow/Nutrition/ Hydration            ADL's     Mod assist for Lower BADL and toileting, supervision for upper body ADL and transfers  Overall Min Assist for BADL and transfers  Improved endurance, safety awareness, memory, transfers, AE training   Mobility     MinA for bed mobility, MinA for short distance ambulation, limited by medical issues  S for bed mobility, transfers, w/c propulsion, MinA for ambulation  upright tolerance, endurance   Communication  Safety/Cognition/ Behavioral Observations           Pain     No c/o pain   no c/o pain   Monitor and treat pain q shift   Skin     Diabetic ulcer L heel Treated by [facility] 8/24, new dressing applied; dermabond to mid chest,   no new skin breakdown  assess skin q shift and prn    Rehab Goals Patient on target to meet rehab goals: No *See Care Plan and progress notes for long and short-term goals.    Barriers to Discharge:  poor stamina, memory, cognition      Possible Resolutions to Barriers:    supervision at home. SNF placement      Discharge Planning/Teaching Needs:    Plan has changed to SNF placement as family cannot provide needed assist      Team Discussion:    May trial with foley out.  Overall decline in mentation this past week?  Requiring mover verbal cueing for tasks.  Plan changed to SNF per  SW.   Revisions to Treatment Plan:    Overall goals downgraded to min assist    Continued Need for Acute Rehabilitation Level of Care: The patient requires daily medical management by a physician with specialized training in physical medicine and rehabilitation for the following conditions: Daily direction of a multidisciplinary physical rehabilitation program to ensure safe treatment while eliciting the highest outcome that is of practical value to the patient.: Yes Daily medical management of patient stability for increased activity during participation in an intensive rehabilitation regime.: Yes Daily analysis of laboratory values and/or radiology reports with any subsequent need for medication adjustment of medical intervention for : Pulmonary problems;Neurological problems;Other  [name] 11/25/2013, 4:25 PM         Amada Jupiter, LCSW Social Worker Signed  Patient Care Conference Service date: 11/19/2013 10:47 AM  Inpatient RehabilitationTeam Conference and Plan of Care Update Date: 11/18/2013   Time: 2:30 PM     Patient Name: [name]       Medical Record Number: [id number]   Date of Birth: 18-Aug-1933 Sex: Male         Room/Bed: 4W11C/4W11C-01 Payor Info: Payor: MEDICARE / Plan: MEDICARE PART A AND B / Product Type: *No Product type* /   Admitting Diagnosis: deconditioning sp pericardial effusion no SLP   Admit Date/Time:  11/13/2013  6:51 PM Admission Comments: No comment available   Primary Diagnosis:  <principal problem not specified> Principal Problem: <principal problem not specified>    Patient Active Problem List     Diagnosis  Date Noted   .  Physical deconditioning  11/13/2013   .  Atrial fibrillation with rapid ventricular response  11/05/2013   .  Chest pain  11/02/2013   .  Ulcer of foot, chronic  11/02/2013   .  Thrombocytopenia  11/02/2013   .  Diarrhea  11/02/2013   .  Scrotal edema  11/02/2013   .  PAD (peripheral artery disease)  10/02/2013   .   Pericardial effusion  10/02/2013   .  Decreased cardiac ejection fraction  10/02/2013   .  Drug allergy  10/02/2013   .  CVA (cerebral infarction)  10/01/2013   .  Orthostatic dizziness  09/30/2013   .  Anemia of chronic disease  09/30/2013   .  CKD (chronic kidney disease) stage 3, GFR 30-59 ml/min  08/01/2012   .  Anemia  08/01/2012   .  Leg edema,  left  08/01/2012   .  Unresponsive episode  07/26/2012   .  Hypoglycemia  07/26/2012   .  Troponin I above reference range  07/26/2012   .  At high risk for falls  07/26/2012   .  Diabetes mellitus without complication     .  Hypertension       Expected Discharge Date: Expected Discharge Date: 11/25/13  Team Members Present: Physician leading conference: Dr. Faith Rogue Social Worker Present: Amada Jupiter, LCSW Nurse Present: Phylliss Bob, RN PT Present: Edman Circle, PT;Jess Glenetta Hew, PT OT Present: Donzetta Kohut, OT SLP Present: Feliberto Gottron, SLP PPS Coordinator present : Edson Snowball, PT        Current Status/Progress  Goal  Weekly Team Focus   Medical     deconditioning after multiple medical issues, poorly healing left heel wound, scrotal edema and urine retention  improve activity tolerance with functional activities  wound care, bladder emptying--reducing scrotal edema   Bowel/Bladder     Continent of bowel LBM 11/16/13, coude catheter in place due to urinary retention  To be continent of bladder and remain continent of bowel.  Assess for any s/s of diarrhea/constipation   Swallow/Nutrition/ Hydration            ADL's     Mod Assist for lower body ADL and toileting, supervision for upper body ADL and transfers  Overall Min Assist for ADL  Endurance, transfers training, functional mobility, compensatory training to manage scrotal edema   Mobility     MinA for bed mobility, MinA for short distance ambulation w/ RW  S for short distance ambulation and stairs, mod (I) w/ w/c propulsion  endurance, stairs, upright tolerance,  activity tolerance   Communication            Safety/Cognition/ Behavioral Observations           Pain     n/a  n/a  Monitor any onset of pain q shift.   Skin     Diabetic ulcer to LLE, Bil edema more on left leg., dermabound to mid chest , incision to rt. side with 4 X 4 gauze.  No new skin breakdown/infection  Assess skin q shift.    Rehab Goals Patient on target to meet rehab goals: Yes *See Care Plan and progress notes for long and short-term goals.    Barriers to Discharge:  scrotal edema, NWB thru left heel, stamina      Possible Resolutions to Barriers:    adaptive techniques, stamina training      Discharge Planning/Teaching Needs:    Home with family to provide 24/7 assistance, however, concern that he may require more assistance than wife/ family can manage.   will schedule prior to d/c    Team Discussion:    Concern with left foot wound.  Replaced foley to allow some relief to scrotal edema.  Balance and strength are good, however, endurance is primary limiting factor.  Concern about caregiver abilities at home - SW to follow up.   Revisions to Treatment Plan:    None    Continued Need for Acute Rehabilitation Level of Care: The patient requires daily medical management by a physician with specialized training in physical medicine and rehabilitation for the following conditions: Daily direction of a multidisciplinary physical rehabilitation program to ensure safe treatment while eliciting the highest outcome that is of practical value to the patient.: Yes Daily medical management of patient stability for increased activity during participation in an intensive rehabilitation  regime.: Yes Daily analysis of laboratory values and/or radiology reports with any subsequent need for medication adjustment of medical intervention for : Pulmonary problems;Cardiac problems;Post surgical problems  [name] 11/19/2013, 10:47 AM

## 2013-11-25 NOTE — Progress Notes (Signed)
Physical Therapy Session Note  Patient Details  Name: [name] MRN: [id number] Date of Birth: Jun 06, 1933  Today's Date: 11/25/2013 PT Individual Time: 0802-0905 PT Individual Time Calculation (min): 63 min  Session 2 Time: 1300-1400 Time Calculation (min): 60 min Session 3 Time: 9935-7017 Time Calculation (min): 34 min  Short Term Goals: Week 1:  PT Short Term Goal 1 (Week 1): STGs=LTGs due to ELOS  Skilled Therapeutic Interventions/Progress Updates:    Session 1: Pt received supine in bed, agreeable to participate in therapy. Session focused on upright tolerance, orthostasis assessment, functional mobility. Pt initially much more awake/alert than yesterday, reported feeling much better. Supine>sit w/ MinA for managing LLE. Upon first sitting, pt reported significant dizziness, which abated after 5-7 minutes of sitting EOB. Therapist measured BP throughout session with diastolic BP measuring in 30's-40's w/ diamap machine, ? Of validity of measurement. Manual BP taken by PT Thereasa Distance showed BP of 138/68. After initial dizziness w/ sitting, pt denied dizziness/lightheadedness for remainder of session. However, therapist noted decreased alertness, with pt being slow to respond for remainder of session. Pt propelled w/c w/ MinA 150', required min VC's for avoiding obstacles on R and for larger pushes w/ BUE for increased efficiency. Pt ambulated 25' w/ RW w/ MinA before needing to sit because of fatigue/weakness. Pt propelled w/c back to room, left seated in w/c w/ NT present.   Session 2: Pt received supine in bed, agreeable to participate in therapy. Reported some stomach pain and nausea, felt he would likely need to use bathroom during session. Session focused on functional mobility, endurance, orientation, attention, memory. Pt moved supine>sit w/ MinA for managing LLE and catheter bag. No reports of dizziness/lightheadedness with sitting. Pt completed x5 sit<>stands from EOB w/ RW and MinA.  Therapist continually engaged pt in therapeutic conversation, pt required cues 75-90% of time to keep track of how many stands he had done, and what therapy he had had this morning. Pt oriented to state/city/county but required cueing to identify what type of building he was in. No reports of dizziness w/ standing. Pt required cueing 50-60% of time for hand placement during sit<>stand transfer. Pt ambulated 10' w/ RW and MinA to bathroom, TotalA to manage perineal care and clothing before/after toileting. After toileting pt completed x5 more sit<>stands at EOB all w/ MinA, cueing for hand placement 20%. Pt moved sit>supine w/ ModA for managing BLE. Of note, pt reported pain on buttock "it feels like it's raw" during mobility. RN made aware.   Session 3: PT received supine in bed, agreeable to participate in therapy. Pt moved supine>sit w/ close S and A for managing catheter bag. Pt w/ SST w/ RW bed>w/c w/ MinGuardA. Of note, pt had one near LOB with excessive anterior lean prior to initiating steps. Unclear if pt had dizzy spell to cause this, pt denied more than minimal dizziness in standing. Pt propelled w/c w/ MinA 100' then 98' w/ rest break in between, max VC's for scanning to R visual field in order to avoid obstacles. Pt w/ ambulation w/ RW w/ MinA 15' then 20' in busy, distractible environment. In this environment pt required mod cues for maintaining straight path. Pt propelled w/c 150' back to room w/ task of counting doors that he passed on R in order to promote R visual field scanning, improve attention. Pt able to attend to task for ~30 seconds before forgetting to count. Pt w/ SST w/c>bed w/ RW and MinGuard A, then moved supine>sit w/ MinA for  managing LLE. Pt left supine in bed w/ all needs within reach.  Therapy Documentation Precautions:  Precautions Precautions: Fall Precaution Comments: wears Radio broadcast assistant when ambulating Restrictions Weight Bearing Restrictions: No LLE Weight Bearing:  Weight bearing as tolerated Other Position/Activity Restrictions: Has boot that he wears at home Vital Signs: Therapy Vitals BP: 138/47 mmHg Pain: Pain Assessment Pain Assessment: No/denies pain Pain Score: 0-No pain Locomotion : Ambulation Ambulation/Gait Assistance: 4: Min assist   See FIM for current functional status  Therapy/Group: Individual Therapy  Hosie Spangle Hosie Spangle, PT, DPT 11/25/2013, 12:07 PM

## 2013-11-26 ENCOUNTER — Inpatient Hospital Stay (HOSPITAL_COMMUNITY): Payer: Medicare Other

## 2013-11-26 ENCOUNTER — Inpatient Hospital Stay (HOSPITAL_COMMUNITY): Payer: Medicare Other | Admitting: Physical Therapy

## 2013-11-26 ENCOUNTER — Encounter (HOSPITAL_COMMUNITY): Payer: Medicare Other | Admitting: Occupational Therapy

## 2013-11-26 ENCOUNTER — Other Ambulatory Visit: Payer: Self-pay | Admitting: Cardiothoracic Surgery

## 2013-11-26 ENCOUNTER — Ambulatory Visit: Payer: Medicare Other | Admitting: Physician Assistant

## 2013-11-26 DIAGNOSIS — N183 Chronic kidney disease, stage 3 unspecified: Secondary | ICD-10-CM

## 2013-11-26 DIAGNOSIS — R5381 Other malaise: Secondary | ICD-10-CM

## 2013-11-26 DIAGNOSIS — I4891 Unspecified atrial fibrillation: Secondary | ICD-10-CM

## 2013-11-26 DIAGNOSIS — Z5189 Encounter for other specified aftercare: Secondary | ICD-10-CM

## 2013-11-26 DIAGNOSIS — D638 Anemia in other chronic diseases classified elsewhere: Secondary | ICD-10-CM

## 2013-11-26 DIAGNOSIS — I319 Disease of pericardium, unspecified: Secondary | ICD-10-CM

## 2013-11-26 LAB — BASIC METABOLIC PANEL
Anion gap: 11 (ref 5–15)
BUN: 47 mg/dL — ABNORMAL HIGH (ref 6–23)
CALCIUM: 8.6 mg/dL (ref 8.4–10.5)
CO2: 35 meq/L — AB (ref 19–32)
Chloride: 87 mEq/L — ABNORMAL LOW (ref 96–112)
Creatinine, Ser: 1.79 mg/dL — ABNORMAL HIGH (ref 0.50–1.35)
GFR calc Af Amer: 40 mL/min — ABNORMAL LOW (ref >90)
GFR calc non Af Amer: 35 mL/min — ABNORMAL LOW (ref >90)
GLUCOSE: 130 mg/dL — AB (ref 70–99)
Potassium: 3.4 mEq/L — ABNORMAL LOW (ref 3.7–5.3)
SODIUM: 133 meq/L — AB (ref 137–147)

## 2013-11-26 LAB — CBC
HEMATOCRIT: 28.8 % — AB (ref 39.0–52.0)
Hemoglobin: 9.4 g/dL — ABNORMAL LOW (ref 13.0–17.0)
MCH: 27.2 pg (ref 26.0–34.0)
MCHC: 32.6 g/dL (ref 30.0–36.0)
MCV: 83.2 fL (ref 78.0–100.0)
Platelets: 174 10*3/uL (ref 150–400)
RBC: 3.46 MIL/uL — ABNORMAL LOW (ref 4.22–5.81)
RDW: 15.4 % (ref 11.5–15.5)
WBC: 6.8 10*3/uL (ref 4.0–10.5)

## 2013-11-26 LAB — GLUCOSE, CAPILLARY
GLUCOSE-CAPILLARY: 128 mg/dL — AB (ref 70–99)
Glucose-Capillary: 134 mg/dL — ABNORMAL HIGH (ref 70–99)
Glucose-Capillary: 119 mg/dL — ABNORMAL HIGH (ref 70–99)
Glucose-Capillary: 154 mg/dL — ABNORMAL HIGH (ref 70–99)
Glucose-Capillary: 152 mg/dL — ABNORMAL HIGH (ref 70–99)

## 2013-11-26 LAB — PROTIME-INR
INR: 2.04 — ABNORMAL HIGH (ref 0.00–1.49)
Prothrombin Time: 23 seconds — ABNORMAL HIGH (ref 11.6–15.2)

## 2013-11-26 MED ORDER — WARFARIN SODIUM 2 MG PO TABS
2.0000 mg | ORAL_TABLET | Freq: Once | ORAL | Status: AC
  Administered 2013-11-26: 2 mg via ORAL
  Filled 2013-11-26: qty 1

## 2013-11-26 MED ORDER — FUROSEMIDE 20 MG PO TABS
20.0000 mg | ORAL_TABLET | Freq: Every day | ORAL | Status: DC
  Filled 2013-11-26: qty 1

## 2013-11-26 NOTE — Progress Notes (Signed)
Social Work Patient ID: [name], male   DOB: [birth year], 80 y.o.   MRN: [id number]  Met with pt and spoke with son, [name], via phone today to review team conference.  Both aware pt making slow gains but team continues to recommend SNF unless family could provide more care at home.  Pt and family agreed on continued search for SNF bed.  [facility] is out - will keep team posted on progress.  Owin Vignola, LCSW

## 2013-11-26 NOTE — Progress Notes (Signed)
Social Work Patient ID: [name], male   DOB: 1933/07/09, 80 y.o.   MRN: [id number]  Have received SNF bed offer today from [facility].  Pt and family accepting bed offer and planning on transfer tomorrow via ambulance.  Tx team and MD aware.    Aliea Bobe, LCSW

## 2013-11-26 NOTE — Progress Notes (Signed)
Patient Name: [name] Date of Encounter: 11/26/2013  Active Problems:   Physical deconditioning    Patient Profile: 80 yo male w/ hx peric effusion, CVA, DM, HTN, CKD, admitted 08/02 with chest pain and tamponade, 750 cc drained 08/03. Pt developed rapid afib. Fluid re-accumulated and TCTS did peric window 08/07. On coumadin for LV thrombus and atrial fib. Admitted to rehab 08/13. Cards following for volume issues. SNF placement planned.   SUBJECTIVE: Pt feels like he is doing well with rehab. Denies SOB or chest pain.  OBJECTIVE Filed Vitals:   11/25/13 1438 11/25/13 2122 11/26/13 0520 11/26/13 0840  BP: 140/39 115/30 122/32 117/53  Pulse: 58 55 52 51  Temp: 98.5 F (36.9 C) 98.5 F (36.9 C) 98 F (36.7 C)   TempSrc: Oral Oral Oral   Resp: 18 18 18    Height:      Weight:   180 lb 5.4 oz (81.8 kg)   SpO2: 98% 99% 99% 100%    Intake/Output Summary (Last 24 hours) at 11/26/13 1233 Last data filed at 11/26/13 0800  Gross per 24 hour  Intake    290 ml  Output   3625 ml  Net  -3335 ml   Filed Weights   11/24/13 0445 11/25/13 0500 11/26/13 0520  Weight: 179 lb 0.2 oz (81.2 kg) 181 lb 3.5 oz (82.2 kg) 180 lb 5.4 oz (81.8 kg)    PHYSICAL EXAM General: Well developed, well nourished, male in no acute distress. Head: Normocephalic, atraumatic.  Neck: Supple without bruits, JVD not elevated Lungs:  Resp regular and unlabored, CTA. Heart: RRR, S1, S2, no S3, S4, or murmur; no rub. Abdomen: Soft, non-tender, non-distended, BS + x 4.  Extremities: No clubbing, cyanosis, 1+ RLE edema.  Neuro: Alert and oriented X 3. Moves all extremities spontaneously. Psych: Normal affect.  LABS: CBC: Recent Labs  11/26/13 0747  WBC 6.8  HGB 9.4*  HCT 28.8*  MCV 83.2  PLT 174   INR: Recent Labs  11/26/13 0747  INR 2.04*   Basic Metabolic Panel: Recent Labs  11/26/13 0747  NA 133*  K 3.4*  CL 87*  CO2 35*  GLUCOSE 130*  BUN 47*  CREATININE 1.79*  CALCIUM  8.6    Current Medications:  . amiodarone  200 mg Oral Daily  . amLODipine  2.5 mg Oral Daily  . aspirin EC  81 mg Oral Daily  . chlorthalidone  25 mg Oral Daily  . doxycycline  100 mg Oral Q12H  . feeding supplement (GLUCERNA SHAKE)  237 mL Oral BID BM  . ferrous sulfate  325 mg Oral TID WC  . [START ON 11/27/2013] furosemide  20 mg Oral Daily  . insulin aspart  0-5 Units Subcutaneous QHS  . senna-docusate  2 tablet Oral BID  . simvastatin  10 mg Oral q1800  . warfarin  2 mg Oral ONCE-1800  . Warfarin - Pharmacist Dosing Inpatient   Does not apply q1800      ASSESSMENT AND PLAN: Active Problems:   Physical deconditioning- per rehab team, SNF recommended, bed search ongoing.    Volume overload - weight is not significantly changed, but volume status seems better, neck veins not as high. LE edema not as severe. BMET above, possible contraction alkalosis. Lasix dose is to decrease to 20 mg daily tomorrow. Still on Chlorthalidone 25 mg daily. Will supp K+ and follow up on BMET ordered for am. Continue to follow weights. May be able to use Chlorthalidone  only for volume management as PO intake more stable now.  Not on diuretic PTA.   Signed, Theodore Demark , PA-C 12:33 PM 11/26/2013  Patient seen, examined. Available data reviewed. Agree with findings, assessment, and plan as outlined by Theodore Demark, PA-C. Exam reveals an elderly male in NAD. Lungs CTA, heart RRR without murmur. I would back off on diuretics - will stop lasix. Continue other Rx's. Meds reviewed and seem appropriate. He is not on much Rx for CHF (no ACE - CKD with risk of AKI, no beta blocker because of bradycardia). Please call if questions arise. thx  Tonny Bollman, M.D. 11/26/2013 1:38 PM

## 2013-11-26 NOTE — Progress Notes (Signed)
Physical Therapy Session Note  Patient Details  Name: [name] MRN: [id number] Date of Birth: 1933/12/17  Today's Date: 11/26/2013 PT Individual Time: 0830-0930 PT Individual Time Calculation (min): 60 min   Skilled Therapeutic Interventions/Progress Updates:    Therapeutic Exercise: PT instructs pt in seated B LE exercises for gentle strengthening: LAQ, march, hip adduction/abduction, ankle pumps x 10 reps each.   Gait Training: PT instructs pt in ambulation with RW x 35' req CGA for safety. PT instructs pt to let her know when he wants a rest break. Pt did not notify PT when he felt dizzy; PT had to ask pt how he was feeling and then pt reports he felt dizzy. After a rest break, PT instructs pt in ambulation with RW x 50' req CGA for safety. Pt desaturates to 85% with ambulation, but recovers to 98% with 3 minute rest break.   WC Management: PT instructs pt in w/c propulsion x 35' with B UE req SBA on level surface and min A on small ramp to enter the bathroom. Pt req tot A for legrests and simple, single step cues for brake management. PT then instructs pt in w/c propulsion x 75' req SBA for safety.   Therapeutic Activity: PT instructs pt in sit to stand with RW req CGA-SBA with verbal cues to "stand safely", indicating pt should push upfrom armrests of w/c. PT instructs pt in toilet transfer from w/c to [facility] over toilet with grab bar req CGA and assist for pants management doffing and donning. Pt had no BM production.  PT instructs pt in car transfer req CGA and verbal cues for sequencing/hand placement.  PT instructs pt in sit to supine transfer req mod A for B LEs.   Pt's functional mobility is worsening. Pt's SaO2 is dropping with lower distances of gait and pt requires increased rest breaks throughout session. Pt now c/o symptomatic dizziness with exertion (ie - gait). Pt assisted back to bed at end of session, bed alarm on.    Therapy Documentation Precautions:   Precautions Precautions: Fall Precaution Comments: wears Radio broadcast assistant when ambulating Restrictions Weight Bearing Restrictions: No LLE Weight Bearing: Weight bearing as tolerated Other Position/Activity Restrictions: Has boot that he wears at home Vital Signs: Therapy Vitals Temp: 98 F (36.7 C) Temp src: Oral Pulse Rate: 51 Resp: 18 BP: 117/53 mmHg Patient Position (if appropriate): Sitting Oxygen Therapy SpO2: 100 % O2 Device: None (Room air) Pain: Pain Assessment Pain Assessment: No/denies pain  See FIM for current functional status  Therapy/Group: Individual Therapy  Tanya Crothers M 11/26/2013, 8:41 AM

## 2013-11-26 NOTE — Progress Notes (Signed)
Physical Therapy Session Note  Patient Details  Name: [name] MRN: [id number] Date of Birth: [birth year]  Today's Date: 11/26/2013 PT Individual Time: 1330-1400 PT Individual Time Calculation (min): 30 min   Short Term Goals: Week 1:  PT Short Term Goal 1 (Week 1): STGs=LTGs due to ELOS  Skilled Therapeutic Interventions/Progress Updates:    PT received seated in w/c w/ RN present, reporting feeling sick to stomach and nauseated. RN reported pt had just had two BMs and was tired from toilet transfers. Therapist assisted pt w/ SPT w/ RW w/c>bed w/ MinA, pt required mod cueing for hand/foot placement and sequencing. Pt sat EOB for ~5 minutes for upright tolerance and seated balance. Sit>supine w/ MinA for managing LLE. After moving supine, pt reported he had moved his bowels. Therapist and NT assisted pt w/ rolling side to side in order to change briefs and clean perineal area. Pt able to roll R w/ ModA and L w/ MinA. Noted pt very fatigued, decreased alertness, decreased arousal vs past days. RN made aware of pt's condition. Pt left supine in bed w/ all needs within reach.  Therapy Documentation Precautions:  Precautions Precautions: Fall Precaution Comments: wears Radio broadcast assistant when ambulating Restrictions Weight Bearing Restrictions: Yes LLE Weight Bearing: Weight bearing as tolerated Other Position/Activity Restrictions: Has boot that he wears at home General:   Vital Signs: Therapy Vitals Temp: 99.1 F (37.3 C) Temp src: Oral Pulse Rate: 60 Resp: 18 BP: 145/44 mmHg Patient Position (if appropriate): Lying Oxygen Therapy SpO2: 100 % O2 Device: None (Room air) Pain: Pain Assessment Pain Assessment: Faces Faces Pain Scale: Hurts little more Pain Location: Abdomen Mobility:   Locomotion : Ambulation Ambulation/Gait Assistance: 4: Min guard Wheelchair Mobility Distance: 75   See FIM for current functional status  Therapy/Group: Individual Therapy  Hosie Spangle Hosie Spangle, PT, DPT 11/26/2013, 4:27 PM

## 2013-11-26 NOTE — Progress Notes (Signed)
Occupational Therapy Note  Patient Details  Name: [name] MRN: [id number] Date of Birth: 09-14-1933  Today's Date: 11/26/2013   Patient missing 30 min skilled occupational therapy secondary to fatigue and pain in stomach. Patient received supine in bed reporting he had been "going to the bathroom all day" and was very fatigue. He reported discomfort in stomach, however declined transferring to toilet. Discussed importance of participation in therapy to increase independence, however pt continued to decline. Pt left supine in bed with all needs in reach.   Margy Sumler N 11/26/2013, 2:52 PM

## 2013-11-26 NOTE — Progress Notes (Signed)
80 y.o. right-handed male with history of previous CVA with little residual, hypertension, diabetes mellitus peripheral neuropathy with chronic nonhealing foot ulcers, chronic renal insufficiency with baseline creatinine 1.56 as well as pericarditis associated with pericardial effusion which was identified during workup of acute CVA. Patient used a walker prior to admission. Wife also with history of CVA and she also uses a walker. Presented 11/02/2013 with chest tightness and palpitations. Echocardiogram with ejection fraction of 50% large pericardial effusion identified features consistent with a moderate tamponade as well as incidental findings of LAA thrombus. Underwent percutaneous pericardiocentesis 11/03/2013 per Dr. [place] with removal proximally 750 mL of fluid. Followup echocardiogram showed resolution of pericardial effusion with echocardiogram again later completed showing some reaccumulation of fluid  Subjective/Complaints: No new issues.  A little restless last night. Therapy team feels he hasn't been doing as well from a stamina and tolerance standpoint this week.   Objective: Vital Signs: Blood pressure 122/32, pulse 52, temperature 98 F (36.7 C), temperature source Oral, resp. rate 18, height 5\' 7"  (1.702 m), weight 81.8 kg (180 lb 5.4 oz), SpO2 99.00%. No results found. Results for orders placed during the hospital encounter of 11/13/13 (from the past 72 hour(s))  GLUCOSE, CAPILLARY     Status: Abnormal   Collection Time    11/23/13 11:37 AM      Result Value Ref Range   Glucose-Capillary 148 (*) 70 - 99 mg/dL  GLUCOSE, CAPILLARY     Status: Abnormal   Collection Time    11/23/13  4:43 PM      Result Value Ref Range   Glucose-Capillary 128 (*) 70 - 99 mg/dL   Comment 1 Notify RN    GLUCOSE, CAPILLARY     Status: Abnormal   Collection Time    11/23/13  9:13 PM      Result Value Ref Range   Glucose-Capillary 127 (*) 70 - 99 mg/dL  PROTIME-INR     Status: Abnormal    Collection Time    11/24/13  7:00 AM      Result Value Ref Range   Prothrombin Time 22.3 (*) 11.6 - 15.2 seconds   INR 1.96 (*) 0.00 - 1.49  GLUCOSE, CAPILLARY     Status: Abnormal   Collection Time    11/24/13  7:26 AM      Result Value Ref Range   Glucose-Capillary 117 (*) 70 - 99 mg/dL  GLUCOSE, CAPILLARY     Status: Abnormal   Collection Time    11/24/13 11:39 AM      Result Value Ref Range   Glucose-Capillary 145 (*) 70 - 99 mg/dL  GLUCOSE, CAPILLARY     Status: Abnormal   Collection Time    11/24/13  4:29 PM      Result Value Ref Range   Glucose-Capillary 146 (*) 70 - 99 mg/dL  GLUCOSE, CAPILLARY     Status: Abnormal   Collection Time    11/24/13  8:24 PM      Result Value Ref Range   Glucose-Capillary 146 (*) 70 - 99 mg/dL  PROTIME-INR     Status: Abnormal   Collection Time    11/25/13  6:49 AM      Result Value Ref Range   Prothrombin Time 24.0 (*) 11.6 - 15.2 seconds   INR 2.15 (*) 0.00 - 1.49  GLUCOSE, CAPILLARY     Status: Abnormal   Collection Time    11/25/13  6:59 AM      Result Value Ref  Range   Glucose-Capillary 125 (*) 70 - 99 mg/dL  GLUCOSE, CAPILLARY     Status: Abnormal   Collection Time    11/25/13 11:29 AM      Result Value Ref Range   Glucose-Capillary 119 (*) 70 - 99 mg/dL  GLUCOSE, CAPILLARY     Status: Abnormal   Collection Time    11/25/13  5:04 PM      Result Value Ref Range   Glucose-Capillary 133 (*) 70 - 99 mg/dL  GLUCOSE, CAPILLARY     Status: Abnormal   Collection Time    11/25/13  9:23 PM      Result Value Ref Range   Glucose-Capillary 137 (*) 70 - 99 mg/dL  GLUCOSE, CAPILLARY     Status: Abnormal   Collection Time    11/26/13  6:35 AM      Result Value Ref Range   Glucose-Capillary 128 (*) 70 - 99 mg/dL     HEENT: normal Cardio: RRR Resp: CTA B/L GI: BS positive and NT, ND Extremity:  Edema R pre tib, LLE in UNNA boot Skin:   Intact and Wound UNNA boot , new no soak through, + foul odor continues, area  non-tender Neuro: Alert/Oriented and Abnormal Motor 3-/5 Left HF, KE, Ankle NT secondary to UNNA, 4/5 BUE andRLE Musc/Skel:  Other No calf tenderness GEN NAD GU: scrotum less swollen with bowel, nontender, no erythema--penis more prominent  Assessment/Plan: 1. Functional deficits secondary to deconditioning secondary to pericardial effusion which require 3+ hours per day of interdisciplinary therapy in a comprehensive inpatient rehab setting. Physiatrist is providing close team supervision and 24 hour management of active medical problems listed below. Physiatrist and rehab team continue to assess barriers to discharge/monitor patient progress toward functional and medical goals. FIM: FIM - Bathing Bathing Steps Patient Completed: Chest;Right Arm;Left Arm;Abdomen Bathing: 3: Mod-Patient completes 5-7 77f 10 parts or 50-74%  FIM - Upper Body Dressing/Undressing Upper body dressing/undressing steps patient completed: Thread/unthread right sleeve of pullover shirt/dresss;Thread/unthread left sleeve of pullover shirt/dress;Put head through opening of pull over shirt/dress;Pull shirt over trunk Upper body dressing/undressing: 5: Set-up assist to: Obtain clothing/put away FIM - Lower Body Dressing/Undressing Lower body dressing/undressing steps patient completed: Pull pants up/down;Fasten/unfasten pants Lower body dressing/undressing: 1: Total-Patient completed less than 25% of tasks  FIM - Toileting Toileting steps completed by patient: Adjust clothing prior to toileting Toileting Assistive Devices: Grab bar or rail for support Toileting: 2: Max-Patient completed 1 of 3 steps  FIM - Diplomatic Services operational officer Devices: Best boy Transfers: 4-To toilet/BSC: Min A (steadying Pt. > 75%);4-From toilet/BSC: Min A (steadying Pt. > 75%)  FIM - Banker Devices: Walker;Bed rails;Arm rests Bed/Chair Transfer: 4: Supine > Sit: Min  A (steadying Pt. > 75%/lift 1 leg);4: Bed > Chair or W/C: Min A (steadying Pt. > 75%);4: Chair or W/C > Bed: Min A (steadying Pt. > 75%)  FIM - Locomotion: Wheelchair Distance: 150 Locomotion: Wheelchair: 4: Travels 150 ft or more: maneuvers on rugs and over door sillls with minimal assistance (Pt.>75%) FIM - Locomotion: Ambulation Locomotion: Ambulation Assistive Devices: Designer, industrial/product Ambulation/Gait Assistance: 4: Min assist Locomotion: Ambulation: 1: Travels less than 50 ft with minimal assistance (Pt.>75%)  Comprehension Comprehension Mode: Auditory Comprehension: 5-Understands basic 90% of the time/requires cueing < 10% of the time  Expression Expression Mode: Verbal Expression: 5-Expresses basic 90% of the time/requires cueing < 10% of the time.  Social Interaction Social Interaction: 4-Interacts appropriately 75 -  89% of the time - Needs redirection for appropriate language or to initiate interaction.  Problem Solving Problem Solving: 3-Solves basic 50 - 74% of the time/requires cueing 25 - 49% of the time  Memory Memory: 4-Recognizes or recalls 75 - 89% of the time/requires cueing 10 - 24% of the time  Medical Problem List and Plan:   1. Functional deficits secondary to deconditioning after pericardial effusion status post subxiphoid pericardial window 11/09/2013   2. DVT Prophylaxis/Anticoagulation: Initiation of Coumadin for LAAthrombus as well intermittent atrial fibrillation. Monitor for any bleeding episodes   3. Pain Management: Oxycodone as needed. Monitor with increased mobility   4. acute blood loss anemia. Continue iron supplement. Followup CBC  today 5. Neuropsych: This patient is capable of making decisions on his own behalf.   6. Skin/Wound Care/chronic diabetic left foot ulcer.: Followup wound care nurse. Continue Radio broadcast assistant. Doxycycline for wound coverage   -continue wraps,wound care 7. Diabetes mellitus with peripheral neuropathy. Latest hemoglobin A1c  5.9. Check blood sugars a.c. and at bedtime. Continue sliding scale insulin    -fair control 8. Hypertension/atrial fibrillation. Lopressor 12.5 mg twice a day, Norvasc 2.5 mg daily, amiodarone 400 mg daily.   - CHF---responsive to lasix  , cxr with effusions---comfortable    -check daily weights  -continue low dose daily oral lasix   -amiodarone decreased to   -appreciate cardiology follow up  -watch low DBP 9. Chronic renal insufficiency.     -labs stable. Cr 1.92----follow up labs today 10. Hyperlipidemia. Zocor   11. Inguinal hernia into scrotum, nothing to be done other than surgical repair which they are not anxious to perform unless there is an incarceration or other emergency---they will see him in the office as an outpt after dc.  -will dc foley and attempt voiding trial 12.  Constipation increase senna to 2 tabs BID, order SMOG enema  LOS (Days) 13 A FACE TO FACE EVALUATION WAS PERFORMED  Dailynn Nancarrow T 11/26/2013, 8:24 AM

## 2013-11-26 NOTE — Progress Notes (Signed)
Physical Therapy Discharge Summary  Patient Details  Name: [name] MRN: [id number] Date of Birth: November 18, 1933  Today's Date: 11/26/2013 PT Individual Time:8:30-9:30  PT Individual Time Calculation (min): 60 min    Patient has met 2 of 10 long term goals due to improved balance and improved coordination with w/c propulsion.  Patient to discharge at a wheelchair level Supervision.   Patient's care partner unavailable to provide the necessary physical and cognitive assistance at discharge.  Reasons goals not met: Pt initially made gains, but over the past few days has demonstrated a functional and cognitive decline including: decreased endurance, worsening activity tolerance, increased mental confusion, fragile cardiac status, symptoms of dizziness, worsening scrotal edema, and generalized deconditioning/weakness throughout all extremities.   Recommendation:  Patient will benefit from ongoing skilled PT services in skilled nursing facility setting to continue to advance safe functional mobility, address ongoing impairments in balance, endurance, and strength, and minimize fall risk.  Equipment: No equipment provided  Reasons for discharge: lack of progress toward goals and wife unable to physically assist pt at home.   Patient/family agrees with progress made and goals achieved: Yes  PT Discharge Precautions/Restrictions Precautions Precautions: Fall Precaution Comments: wears Unna boot when ambulating, PRAFO when in bed, elevate scrotum when lying/sitting Restrictions Weight Bearing Restrictions: No LLE Weight Bearing: Weight bearing as tolerated Vital Signs Therapy Vitals Temp: 99.1 F (37.3 C) Temp src: Oral Pulse Rate: 60 Resp: 18 BP: 145/44 mmHg Patient Position (if appropriate): Lying Oxygen Therapy SpO2: 100 % O2 Device: None (Room air) Pain Pain Assessment Pain Assessment: Faces Faces Pain Scale: Hurts little more Pain Location: Abdomen Vision/Perception   Vision - Assessment Additional Comments: Pt reports he had an appointment set up with an eye doctor, but then was admitted to the hospital.  Perception Comments: continued difficulty at times naming assistive devices  Cognition Overall Cognitive Status: Impaired/Different from baseline (worsening cognition since admission) Arousal/Alertness: Awake/alert Orientation Level: Oriented to person;Oriented to place;Oriented to situation;Disoriented to time Attention: Divided;Alternating;Selective Focused Attention: Appears intact Sustained Attention: Appears intact Selective Attention: Appears intact Alternating Attention: Impaired Alternating Attention Impairment: Verbal basic;Functional basic Divided Attention: Impaired Divided Attention Impairment: Functional basic;Verbal basic;Verbal complex Memory: Impaired Memory Impairment: Decreased recall of new information;Decreased short term memory Decreased Short Term Memory: Functional basic Awareness: Impaired Awareness Impairment: Anticipatory impairment;Emergent impairment Problem Solving: Impaired Problem Solving Impairment: Functional basic Executive Function: Sequencing Sequencing: Impaired Sequencing Impairment: Functional basic Safety/Judgment: Impaired Sensation Sensation Light Touch: Impaired by gross assessment;Impaired Detail Light Touch Impaired Details: Impaired RUE;Impaired LUE;Impaired RLE;Impaired LLE Stereognosis: Not tested Hot/Cold: Not tested Proprioception: Not tested Motor  Motor Motor: Abnormal postural alignment and control Motor - Discharge Observations: slouched posture and retropulsive in sit due to swollen scrotum and chronic sitting posture  Mobility Bed Mobility Bed Mobility: Rolling Left;Left Sidelying to Sit;Sit to Supine Rolling Left: 5: Supervision Rolling Left Details: Verbal cues for technique Rolling Left Details (indicate cue type and reason): reach your arm for the edge of the bed Left  Sidelying to Sit: 3: Mod assist Left Sidelying to Sit Details: Manual facilitation for weight shifting;Verbal cues for technique;Verbal cues for sequencing Left Sidelying to Sit Details (indicate cue type and reason): hand placement Sit to Supine: 3: Mod assist Sit to Supine - Details: Manual facilitation for placement;Verbal cues for technique Transfers Transfers: Yes Sit to Stand: 4: Min guard Sit to Stand Details: Verbal cues for technique Sit to Stand Details (indicate cue type and reason): hand placement Stand Pivot Transfers: 4: Min  assist Stand Pivot Transfer Details: Verbal cues for technique Stand Pivot Transfer Details (indicate cue type and reason): hand placement, walker safety Locomotion  Ambulation Ambulation: Yes Ambulation/Gait Assistance: 4: Min guard Ambulation Distance (Feet): 35 Feet Assistive device: Rolling walker (L orthotic shoe) Gait Gait: Yes Gait Pattern: Impaired Gait Pattern: Shuffle;Decreased hip/knee flexion - right;Decreased hip/knee flexion - left;Decreased step length - right;Decreased step length - left;Trunk flexed;Wide base of support Gait velocity: slowed Stairs / Additional Locomotion Stairs: No Architect: Yes Wheelchair Assistance: 5: Investment banker, operational Details: Verbal cues for Marketing executive: Both upper extremities Wheelchair Parts Management: Needs assistance Distance: 75  Trunk/Postural Assessment  Cervical Assessment Cervical Assessment: Within Functional Limits Thoracic Assessment Thoracic Assessment: Within Functional Limits Lumbar Assessment Lumbar Assessment: Within Functional Limits Postural Control Postural Control: Deficits on evaluation Postural Limitations: chronic kyphotic and forward head posture, sacral sitting due to swollen scrotum  Balance Balance Balance Assessed: Yes Static Sitting Balance Static Sitting - Balance Support: Bilateral upper extremity  supported;Feet supported Static Sitting - Level of Assistance: 5: Stand by assistance Dynamic Sitting Balance Dynamic Sitting - Balance Support: Bilateral upper extremity supported;Feet supported Dynamic Sitting - Level of Assistance: 4: Min Insurance risk surveyor Standing - Balance Support: During functional activity;Bilateral upper extremity supported Static Standing - Level of Assistance: 4: Min assist Dynamic Standing Balance Dynamic Standing - Balance Support: During functional activity;Bilateral upper extremity supported Dynamic Standing - Level of Assistance: 4: Min assist Dynamic Standing - Balance Activities: Forward lean/weight shifting;Lateral lean/weight shifting  See FIM for current functional status  [name] M 11/26/2013, 4:59 PM

## 2013-11-26 NOTE — Discharge Summary (Signed)
NAME:  [name], [name] NO.:  [id number]  MEDICAL RECORD NO.:  [id number]  LOCATION:  4W22C                        FACILITY:  [facility]  PHYSICIAN:  Ranelle Oyster, M.D.DATE OF BIRTH:  05/11/1933  DATE OF ADMISSION:  11/13/2013 DATE OF DISCHARGE:                              DISCHARGE SUMMARY   DISCHARGE DIAGNOSES: 1. Functional deficit secondary to deconditioning after pericardial     effusion, status post subxiphoid pericardial window, November 09, 2013. 2. Coumadin for left atrial appendage thrombus as well as intermittent     atrial fibrillation. 3. Pain management. 4. Acute blood loss anemia. 5. Skin care with wound care. 6. Diabetes mellitus, peripheral neuropathy. 7. Hypertension, atrial fibrillation. 8. Chronic renal insufficiency. 9. Hyperlipidemia. 10.Inguinal hernia into the scrotum. 11.Constipation, resolving.  HISTORY OF PRESENT ILLNESS:  This is a 80 year old right-handed male, history of previous CVA, hypertension, diabetes mellitus, as well as chronic nonhealing foot ulcers, and pericarditis associated with pericardial effusion which was identified during workup of acute CVA in the past.  The patient used a walker prior to admission.  Wife has a history of CVA and also uses a walker.  Presented November 02, 2013 with chest tightness, palpitations.  Echocardiogram with ejection fraction of 50%, large pericardial effusion identified, feature is consistent with a moderate tamponade as well as incidental findings of LAA thrombus. Underwent percutaneous pericardiocentesis November 03, 2013, per Dr. [place] with removal of 750 mL of fluid.  Followup echocardiogram showed resolution of pericardial effusion.  An echocardiogram again later completed showing reaccumulation of fluid, thus Cardiothoracic Surgery consulted and underwent subxiphoid pericardial window, with drainage of pericardial effusion, November 09, 2013.  Placed on Coumadin for LAA - thrombus  as well as intermittent bouts of atrial fibrillation and monitored.  Wound Care nurse follow up for chronic foot ulcers with skin care as directed with Unna boot in place and initially placed on doxycycline for wound coverage.  Physical and occupational therapy ongoing.  The patient was admitted for comprehensive rehab program.  PAST MEDICAL HISTORY:  See discharge diagnoses.  SOCIAL HISTORY:  Lives with spouse.  FUNCTIONAL HISTORY:  Prior to admission, independent with assistive device.  Functional status upon admission to rehab service was minimal guard to ambulate 70 feet with a rolling walker, needing assist overall transfer levels, min-mod assist activities of daily living.  PHYSICAL EXAMINATION:  VITAL SIGNS:  Blood pressure 142/55, pulse 53, temperature 97, respirations 16. GENERAL:  This was an alert male, oriented x3.  Cardiac rate controlled. LUNGS:  Clear to auscultation. ABDOMEN:  Soft, nontender.  Good bowel sounds.  Surgical sites clean and dry. EXTREMITIES:  Unna boot in place left lower extremity.  REHABILITATION HOSPITAL COURSE:  The patient was admitted to inpatient rehab services with therapies initiated on a 3-hour daily basis consisting of physical therapy, occupational therapy, and rehabilitation nursing.  The following issues were addressed during the patient's rehabilitation stay.  Pertaining to Mr. Ruz functional deficits, deconditioning related to pericardial effusion, had undergone subxiphoid pericardial window, November 09, 2013, would follow up with Cardiothoracic Surgery.  Coumadin was initiated for LAA - thrombus, intermittent atrial fibrillation, cardiac rate controlled.  No bleeding episodes.  Latest INR of 2.04 with a goal INR of 2.00-3.00.  Pain management with use of oxycodone as needed and monitored.  Acute blood loss anemia with latest hemoglobin 9.4, hematocrit 28.8.  Blood pressures monitored.  No orthostasis.  The patient remained on  amiodarone 200 mg daily at the discretion of Cardiology Services.  He will continue with low-dose Lasix.  Chronic renal insufficiency.  Latest creatinine of 1.79 with a baseline of 1.92.  The patient with chronic diabetic foot ulcers, Wound Care nurse follows.  He had completed a course of doxycycline as advised for wound coverage.  The patient with inguinal hernia into the scrotum, nothing to be done at this time for surgical repair.  He would follow up as an outpatient.  The patient received weekly collaborative interdisciplinary team conferences to discuss estimated length of stay, family teaching, and any barriers to discharge.  He was ambulating with a rolling walker 50 feet, contact guard assist for safety, monitoring for any desaturation with ambulation.  Strength and endurance, slow to improve.  The patient engages in bed mobility with close supervision and moderate verbal cues.  Ambulated into the bathroom, moderate verbal cues, minimal physical assistance.  Again due to physical constraints on patient's wife, it was felt skilled nursing facility was needed with bed becoming available, November 27, 2013.  DISCHARGE MEDICATIONS:  Included amiodarone 200 mg p.o. daily, Norvasc 2.5 mg p.o. daily, aspirin 81 mg p.o. daily, [place] 25 mg p.o. daily, ferrous sulfate 325 mg p.o. t.i.d., Lasix 20 mg p.o. daily, oxycodone immediate release 5-10 mg p.o. every 4 hours as needed severe pain, Senokot-S 2 tablets p.o. b.i.d. hold for loose stools, Zocor 10 mg p.o. daily, Coumadin latest dose 2 mg adjusted accordingly for an INR of 2.00- 3.00.  DIET:  His diet was a heart healthy diet, 1800-calorie ADA.  SPECIAL INSTRUCTIONS:  Unna boot every Monday to left lower extremity. Scrotal support for inguinal hernia.  Continue to check INRs weekly with a goal INR of 2.00-3.00.  The patient would follow up with Dr. Faith Rogue at the Outpatient Rehab Service office as needed.  Dr. Nanetta Batty,  Cardiology Services, 2 weeks, call for appointment.  Dr. Sheliah Plane, Cardiothoracic Surgery, 2 weeks.  Dr. Gaynelle Adu for followup on patient's hernia.  Dr. Aldean Baker for chronic wound ulcers, lower extremities.     Mariam Dollar, P.A.   ______________________________ Ranelle Oyster, M.D.    DA/MEDQ  D:  11/26/2013  T:  11/26/2013  Job:  [id number]  cc:   Mary Sella. Andrey Campanile, MD, FACS Nadara Mustard, MD Sheliah Plane, MD Nanetta Batty, M.D.

## 2013-11-26 NOTE — Progress Notes (Signed)
Occupational Therapy Session Note  Patient Details  Name: [name] MRN: [id number] Date of Birth: June 30, 1933  Today's Date: 11/26/2013 OT Individual Time: 6948-5462 OT Individual Time Calculation (min): 60 min   Short Term Goals: Week 1:  OT Short Term Goal 1 (Week 1): STG = LTGs due to ELOS  Week 2:  OT Short Term Goal 1 (Week 2): Pt will complete toileting hygiene with Mod Assist OT Short Term Goal 2 (Week 2): Pt will demo ability to use reacher to improve dressing skills OT Short Term Goal 3 (Week 2): Pt will complete transfer on/off tub bench in standard tub with Min Assist OT Short Term Goal 4 (Week 2): Pt will independently adjust lower body positioning to reduce discomfort at groin during BADL  Skilled Therapeutic Interventions/Progress Updates:  Patient received supine in bed eating breakfast with no complaints of pain. Patient engaged in bed mobility with close supervision and moderate verbal cues. Patient sat EOB with some complaints of dizziness, HR=52, 02sats on room air=91. Therapist donned shoe > RLE and una boot > LLE. Patient sat a few minutes, then stood with RW and ambulated into bathroom with moderate verbal cues and minimal physical assistance. Patient sat on tub bench and therapist wrapped LLE and covered incision site on chest area. UB/LB bathing completed in sit<>stand position with steady assist and moderate verbal cues. Patient ambulated back to room for UB/LB dressing in sit<>stand position from bed level. Patient transferred > w/c and completed grooming tasks seated at sink. Patient took more than a reasonable amount of time to complete tasks and required moderate verbal cues during session for initiation and problem solving. 02 sats and HR checked throughout session, 02 sats on room air remained <91% and HR ranged 52-54. Therapist left patient seated in w/c with scrotum elevated and all needs within reach; PT present as well.   Precautions:   Precautions Precautions: Fall Precaution Comments: wears Radio broadcast assistant when ambulating Restrictions Weight Bearing Restrictions: No LLE Weight Bearing: Weight bearing as tolerated Other Position/Activity Restrictions: Has boot that he wears at home  See FIM for current functional status  Therapy/Group: Individual Therapy  Kailani Brass 11/26/2013, 8:48 AM

## 2013-11-26 NOTE — Progress Notes (Signed)
ANTICOAGULATION CONSULT NOTE - Follow Up Consult  Pharmacy Consult for coumadin Indication: afib and atrial thrombus  No Known Allergies  Patient Measurements: Height: 5\' 7"  (170.2 cm) Weight: 180 lb 5.4 oz (81.8 kg) IBW/kg (Calculated) : 66.1 Heparin Dosing Weight:   Vital Signs: Temp: 98 F (36.7 C) (08/26 0520) Temp src: Oral (08/26 0520) BP: 122/32 mmHg (08/26 0520) Pulse Rate: 52 (08/26 0520)  Labs:  Recent Labs  11/24/13 0700 11/25/13 0649  LABPROT 22.3* 24.0*  INR 1.96* 2.15*    Estimated Creatinine Clearance: 34.1 ml/min (by C-G formula based on Cr of 1.83).   Medications:  Scheduled:  . amiodarone  200 mg Oral Daily  . amLODipine  2.5 mg Oral Daily  . aspirin EC  81 mg Oral Daily  . chlorthalidone  25 mg Oral Daily  . doxycycline  100 mg Oral Q12H  . feeding supplement (GLUCERNA SHAKE)  237 mL Oral BID BM  . ferrous sulfate  325 mg Oral TID WC  . furosemide  40 mg Oral Daily  . insulin aspart  0-5 Units Subcutaneous QHS  . senna-docusate  2 tablet Oral BID  . simvastatin  10 mg Oral q1800  . Warfarin - Pharmacist Dosing Inpatient   Does not apply q1800   Infusions:    Assessment: 80 yo male with afib and atrial thrombus is currently on therapeutic coumadin.  INR today is 2.04. Patient is on amiodarone and doxycycline which can affect INR.  Goal of Therapy:  INR 2-3 Monitor platelets by anticoagulation protocol: Yes   Plan:  - coumadin 2mg  po x1 - INR in am  Pollie Poma, Tsz-Yin 11/26/2013,8:27 AM

## 2013-11-26 NOTE — Discharge Summary (Signed)
Discharge summary job # [id number]

## 2013-11-26 NOTE — Plan of Care (Signed)
Problem: RH Bed Mobility Goal: LTG Patient will perform bed mobility with assist (PT) LTG: Patient will perform bed mobility with assistance, with/without cues (PT).  Outcome: Not Met (add Reason) Pt continues to req mod A for sit to/from supine  Problem: RH Bed to Chair Transfers Goal: LTG Patient will perform bed/chair transfers w/assist (PT) LTG: Patient will perform bed/chair transfers with assistance, with/without cues (PT).  Outcome: Not Met (add Reason) Pt occasionally req min A due to impaired dynamic standing balance and symptoms such as dizziness.   Problem: RH Car Transfers Goal: LTG Patient will perform car transfers with assist (PT) LTG: Patient will perform car transfers with assistance (PT).  Outcome: Not Met (add Reason) Pt req CGA-min A due to impaired balance.   Problem: RH Furniture Transfers Goal: LTG Patient will perform furniture transfers w/assist (OT/PT LTG: Patient will perform furniture transfers with assistance (OT/PT).  Outcome: Not Met (add Reason) Pt req CGA-min A due to impaired dynamic stand balance.  Problem: RH Ambulation Goal: LTG Patient will ambulate in controlled environment (PT) LTG: Patient will ambulate in a controlled environment, # of feet with assistance (PT).  Outcome: Not Met (add Reason) Pt dizziness limits his gait endurance.  Goal: LTG Patient will ambulate in home environment (PT) LTG: Patient will ambulate in home environment, # of feet with assistance (PT).  Outcome: Not Met (add Reason) Pt can ambulate 35'-50' at a time with CGA-min A with RW; unable to consistently ambulate 50'.   Problem: RH Wheelchair Mobility Goal: LTG Patient will propel w/c in controlled environment (PT) LTG: Patient will propel wheelchair in controlled environment, # of feet with assist (PT)  Outcome: Not Met (add Reason) Pt is only able to self propel 67' with SBA due to fatigue.   Problem: RH Stairs Goal: LTG Patient will ambulate up and down  stairs w/assist (PT) LTG: Patient will ambulate up and down # of stairs with assistance (PT)  Outcome: Not Met (add Reason) Pt too dizzy to do stairs.

## 2013-11-27 ENCOUNTER — Ambulatory Visit: Payer: Medicare Other | Admitting: Cardiothoracic Surgery

## 2013-11-27 LAB — BASIC METABOLIC PANEL
Anion gap: 9 (ref 5–15)
BUN: 46 mg/dL — AB (ref 6–23)
CHLORIDE: 86 meq/L — AB (ref 96–112)
CO2: 36 mEq/L — ABNORMAL HIGH (ref 19–32)
Calcium: 8.9 mg/dL (ref 8.4–10.5)
Creatinine, Ser: 1.8 mg/dL — ABNORMAL HIGH (ref 0.50–1.35)
GFR calc non Af Amer: 34 mL/min — ABNORMAL LOW (ref >90)
GFR, EST AFRICAN AMERICAN: 40 mL/min — AB (ref >90)
GLUCOSE: 125 mg/dL — AB (ref 70–99)
POTASSIUM: 4 meq/L (ref 3.7–5.3)
Sodium: 131 mEq/L — ABNORMAL LOW (ref 137–147)

## 2013-11-27 LAB — GLUCOSE, CAPILLARY
GLUCOSE-CAPILLARY: 129 mg/dL — AB (ref 70–99)
Glucose-Capillary: 122 mg/dL — ABNORMAL HIGH (ref 70–99)

## 2013-11-27 LAB — PROTIME-INR
INR: 2.36 — AB (ref 0.00–1.49)
Prothrombin Time: 25.8 seconds — ABNORMAL HIGH (ref 11.6–15.2)

## 2013-11-27 MED ORDER — WARFARIN SODIUM 1 MG PO TABS
1.5000 mg | ORAL_TABLET | Freq: Once | ORAL | Status: DC
  Filled 2013-11-27: qty 1

## 2013-11-27 NOTE — Progress Notes (Signed)
80 y.o. right-handed male with history of previous CVA with little residual, hypertension, diabetes mellitus peripheral neuropathy with chronic nonhealing foot ulcers, chronic renal insufficiency with baseline creatinine 1.56 as well as pericarditis associated with pericardial effusion which was identified during workup of acute CVA. Patient used a walker prior to admission. Wife also with history of CVA and she also uses a walker. Presented 11/02/2013 with chest tightness and palpitations. Echocardiogram with ejection fraction of 50% large pericardial effusion identified features consistent with a moderate tamponade as well as incidental findings of LAA thrombus. Underwent percutaneous pericardiocentesis 11/03/2013 per Dr. [place] with removal proximally 750 mL of fluid. Followup echocardiogram showed resolution of pericardial effusion with echocardiogram again later completed showing some reaccumulation of fluid  Subjective/Complaints: Pt emptied bladder surprisingly well after foley was removed (pvr's only in 200's)  Objective: Vital Signs: Blood pressure 141/48, pulse 59, temperature 97.9 F (36.6 C), temperature source Oral, resp. rate 18, height 5' 7"  (1.702 m), weight 74.6 kg (164 lb 7.4 oz), SpO2 100.00%. No results found. Results for orders placed during the hospital encounter of 11/13/13 (from the past 72 hour(s))  GLUCOSE, CAPILLARY     Status: Abnormal   Collection Time    11/24/13 11:39 AM      Result Value Ref Range   Glucose-Capillary 145 (*) 70 - 99 mg/dL  GLUCOSE, CAPILLARY     Status: Abnormal   Collection Time    11/24/13  4:29 PM      Result Value Ref Range   Glucose-Capillary 146 (*) 70 - 99 mg/dL  GLUCOSE, CAPILLARY     Status: Abnormal   Collection Time    11/24/13  8:24 PM      Result Value Ref Range   Glucose-Capillary 146 (*) 70 - 99 mg/dL  PROTIME-INR     Status: Abnormal   Collection Time    11/25/13  6:49 AM      Result Value Ref Range   Prothrombin Time 24.0  (*) 11.6 - 15.2 seconds   INR 2.15 (*) 0.00 - 1.49  GLUCOSE, CAPILLARY     Status: Abnormal   Collection Time    11/25/13  6:59 AM      Result Value Ref Range   Glucose-Capillary 125 (*) 70 - 99 mg/dL  GLUCOSE, CAPILLARY     Status: Abnormal   Collection Time    11/25/13 11:29 AM      Result Value Ref Range   Glucose-Capillary 119 (*) 70 - 99 mg/dL  GLUCOSE, CAPILLARY     Status: Abnormal   Collection Time    11/25/13  5:04 PM      Result Value Ref Range   Glucose-Capillary 133 (*) 70 - 99 mg/dL  GLUCOSE, CAPILLARY     Status: Abnormal   Collection Time    11/25/13  9:23 PM      Result Value Ref Range   Glucose-Capillary 137 (*) 70 - 99 mg/dL  GLUCOSE, CAPILLARY     Status: Abnormal   Collection Time    11/26/13  6:35 AM      Result Value Ref Range   Glucose-Capillary 128 (*) 70 - 99 mg/dL  PROTIME-INR     Status: Abnormal   Collection Time    11/26/13  7:47 AM      Result Value Ref Range   Prothrombin Time 23.0 (*) 11.6 - 15.2 seconds   INR 2.04 (*) 0.00 - 8.02  BASIC METABOLIC PANEL     Status: Abnormal  Collection Time    11/26/13  7:47 AM      Result Value Ref Range   Sodium 133 (*) 137 - 147 mEq/L   Potassium 3.4 (*) 3.7 - 5.3 mEq/L   Chloride 87 (*) 96 - 112 mEq/L   CO2 35 (*) 19 - 32 mEq/L   Glucose, Bld 130 (*) 70 - 99 mg/dL   BUN 47 (*) 6 - 23 mg/dL   Creatinine, Ser 1.79 (*) 0.50 - 1.35 mg/dL   Calcium 8.6  8.4 - 10.5 mg/dL   GFR calc non Af Amer 35 (*) >90 mL/min   GFR calc Af Amer 40 (*) >90 mL/min   Comment: (NOTE)     The eGFR has been calculated using the CKD EPI equation.     This calculation has not been validated in all clinical situations.     eGFR's persistently <90 mL/min signify possible Chronic Kidney     Disease.   Anion gap 11  5 - 15  CBC     Status: Abnormal   Collection Time    11/26/13  7:47 AM      Result Value Ref Range   WBC 6.8  4.0 - 10.5 K/uL   RBC 3.46 (*) 4.22 - 5.81 MIL/uL   Hemoglobin 9.4 (*) 13.0 - 17.0 g/dL   HCT  28.8 (*) 39.0 - 52.0 %   MCV 83.2  78.0 - 100.0 fL   MCH 27.2  26.0 - 34.0 pg   MCHC 32.6  30.0 - 36.0 g/dL   RDW 15.4  11.5 - 15.5 %   Platelets 174  150 - 400 K/uL  GLUCOSE, CAPILLARY     Status: Abnormal   Collection Time    11/26/13 11:25 AM      Result Value Ref Range   Glucose-Capillary 134 (*) 70 - 99 mg/dL  GLUCOSE, CAPILLARY     Status: Abnormal   Collection Time    11/26/13  3:34 PM      Result Value Ref Range   Glucose-Capillary 119 (*) 70 - 99 mg/dL  GLUCOSE, CAPILLARY     Status: Abnormal   Collection Time    11/26/13  5:20 PM      Result Value Ref Range   Glucose-Capillary 154 (*) 70 - 99 mg/dL  GLUCOSE, CAPILLARY     Status: Abnormal   Collection Time    11/26/13  8:51 PM      Result Value Ref Range   Glucose-Capillary 152 (*) 70 - 99 mg/dL  PROTIME-INR     Status: Abnormal   Collection Time    11/27/13  6:10 AM      Result Value Ref Range   Prothrombin Time 25.8 (*) 11.6 - 15.2 seconds   INR 2.36 (*) 0.00 - 2.50  BASIC METABOLIC PANEL     Status: Abnormal   Collection Time    11/27/13  6:10 AM      Result Value Ref Range   Sodium 131 (*) 137 - 147 mEq/L   Potassium 4.0  3.7 - 5.3 mEq/L   Chloride 86 (*) 96 - 112 mEq/L   CO2 36 (*) 19 - 32 mEq/L   Glucose, Bld 125 (*) 70 - 99 mg/dL   BUN 46 (*) 6 - 23 mg/dL   Creatinine, Ser 1.80 (*) 0.50 - 1.35 mg/dL   Calcium 8.9  8.4 - 10.5 mg/dL   GFR calc non Af Amer 34 (*) >90 mL/min   GFR calc Af Amer 40 (*) >  90 mL/min   Comment: (NOTE)     The eGFR has been calculated using the CKD EPI equation.     This calculation has not been validated in all clinical situations.     eGFR's persistently <90 mL/min signify possible Chronic Kidney     Disease.   Anion gap 9  5 - 15  GLUCOSE, CAPILLARY     Status: Abnormal   Collection Time    11/27/13  7:17 AM      Result Value Ref Range   Glucose-Capillary 122 (*) 70 - 99 mg/dL     HEENT: normal Cardio: RRR Resp: CTA B/L GI: BS positive and NT, ND Extremity:   Edema R pre tib, LLE in [employer] boot Skin:   Intact and Wound UNNA boot , new no soak through, + foul odor continues, area non-tender Neuro: Alert/Oriented and Abnormal Motor 3-/5 Left HF, KE, Ankle NT secondary to UNNA, 4/5 BUE andRLE Musc/Skel:  Other No calf tenderness GEN NAD GU: scrotum less swollen with bowel, nontender, no erythema--penis more prominent  Assessment/Plan: 1. Functional deficits secondary to deconditioning secondary to pericardial effusion which require 3+ hours per day of interdisciplinary therapy in a comprehensive inpatient rehab setting. Physiatrist is providing close team supervision and 24 hour management of active medical problems listed below. Physiatrist and rehab team continue to assess barriers to discharge/monitor patient progress toward functional and medical goals.  To SNF today   FIM: FIM - Bathing Bathing Steps Patient Completed: Chest;Right Arm;Left Arm;Abdomen;Front perineal area;Buttocks;Right upper leg;Left upper leg Bathing: 4: Min-Patient completes 8-9 53f10 parts or 75+ percent  FIM - Upper Body Dressing/Undressing Upper body dressing/undressing steps patient completed: Thread/unthread right sleeve of pullover shirt/dresss;Thread/unthread left sleeve of pullover shirt/dress;Put head through opening of pull over shirt/dress;Pull shirt over trunk Upper body dressing/undressing: 5: Set-up assist to: Obtain clothing/put away FIM - Lower Body Dressing/Undressing Lower body dressing/undressing steps patient completed: Pull pants up/down;Fasten/unfasten pants Lower body dressing/undressing: 1: Total-Patient completed less than 25% of tasks  FIM - Toileting Toileting steps completed by patient: Adjust clothing prior to toileting Toileting Assistive Devices: Grab bar or rail for support Toileting: 1: Total-Patient completed zero steps, helper did all 3  FIM - TRadio producerDevices: BRecruitment consultantTransfers: 4-To  toilet/BSC: Min A (steadying Pt. > 75%);4-From toilet/BSC: Min A (steadying Pt. > 75%)  FIM - BControl and instrumentation engineerDevices: Walker;Arm rests Bed/Chair Transfer: 3: Sit > Supine: Mod A (lifting assist/Pt. 50-74%/lift 2 legs);4: Chair or W/C > Bed: Min A (steadying Pt. > 75%)  FIM - Locomotion: Wheelchair Distance: 75 Locomotion: Wheelchair: 2: Travels 564- 149 ft with supervision, cueing or coaxing FIM - Locomotion: Ambulation Locomotion: Ambulation Assistive Devices: WAdministratorAmbulation/Gait Assistance: 4: Min guard Locomotion: Ambulation: 2: Travels 50 - 149 ft with minimal assistance (Pt.>75%)  Comprehension Comprehension Mode: Auditory Comprehension: 4-Understands basic 75 - 89% of the time/requires cueing 10 - 24% of the time  Expression Expression Mode: Verbal Expression: 5-Expresses basic 90% of the time/requires cueing < 10% of the time.  Social Interaction Social Interaction: 4-Interacts appropriately 75 - 89% of the time - Needs redirection for appropriate language or to initiate interaction.  Problem Solving Problem Solving: 3-Solves basic 50 - 74% of the time/requires cueing 25 - 49% of the time  Memory Memory: 3-Recognizes or recalls 50 - 74% of the time/requires cueing 25 - 49% of the time  Medical Problem List and Plan:   1. Functional  deficits secondary to deconditioning after pericardial effusion status post subxiphoid pericardial window 11/09/2013   2. DVT Prophylaxis/Anticoagulation: Initiation of Coumadin for LAAthrombus as well intermittent atrial fibrillation. Monitor for any bleeding episodes   3. Pain Management: Oxycodone as needed. Monitor with increased mobility   4. acute blood loss anemia. Continue iron supplement. Followup CBC  today 5. Neuropsych: This patient is capable of making decisions on his own behalf.   6. Skin/Wound Care/chronic diabetic left foot ulcer.: Followup wound care nurse. Continue Haematologist.  Doxycycline for wound coverage   -continue wraps,wound care 7. Diabetes mellitus with peripheral neuropathy. Latest hemoglobin A1c 5.9. Check blood sugars a.c. and at bedtime. Continue sliding scale insulin    -fair control 8. Hypertension/atrial fibrillation. Lopressor 12.5 mg twice a day, Norvasc 2.5 mg daily, amiodarone 400 mg daily.   - CHF---responsive to lasix  , cxr with effusions---comfortable    -check daily weights  -continue low dose daily oral lasix   -amiodarone decreased to 255m  -appreciate cardiology follow up    9. Chronic renal insufficiency.     -  Cr 1.8--- lasix slightly reduced  -recommend follow up BMET on Tuesday to determine if further med adjustments need to be made  10. Hyperlipidemia. Zocor   11. Inguinal hernia into scrotum, nothing to be done other than surgical repair which they are not anxious to perform unless there is an incarceration or other emergency---they will see him in the office as an outpt after dc.  -pt emptying  -encouraged voids on BSC or toilet 12.  Constipation increase senna to 2 tabs BID, order SMOG enema  LOS (Days) 14 A FACE TO FACE EVALUATION WAS PERFORMED  SWARTZ,ZACHARY T 11/27/2013, 8:31 AM

## 2013-11-27 NOTE — Progress Notes (Signed)
Social Work  Discharge Note  The overall goal for the admission was met for:   Discharge location: NO - plan changed to SNF as family cannot meet care needs at home.  Length of Stay: No - LOS 2d greater than anticipated due to SNF bed search - LOS = 14 days  Discharge activity level: No - most goals downgraded due to poor endurance overall  Home/community participation: No  Services provided included: MD, RD, PT, OT, RN, TR, Pharmacy and SW  Financial Services: Medicare and Medicaid  Follow-up services arranged: Other: SNF at [facility] Re[facility]  Comments (or additional information):  Patient/Family verbalized understanding of follow-up arrangements: Yes  Individual responsible for coordination of the follow-up plan: patient and son, Elta [place]  Confirmed correct DME delivered: NA    Gina Costilla

## 2013-11-27 NOTE — Progress Notes (Signed)
Report called to Charlene at [facility]

## 2013-11-27 NOTE — Progress Notes (Signed)
ANTICOAGULATION CONSULT NOTE - Follow Up Consult  Pharmacy Consult for coumadin Indication: afib and atrial thrombus  No Known Allergies  Patient Measurements: Height: 5\' 7"  (170.2 cm) Weight: 164 lb 7.4 oz (74.6 kg) IBW/kg (Calculated) : 66.1 Heparin Dosing Weight:   Vital Signs: Temp: 97.9 F (36.6 C) (08/27 0642) Temp src: Oral (08/27 0642) BP: 141/48 mmHg (08/27 0642) Pulse Rate: 59 (08/27 0642)  Labs:  Recent Labs  11/25/13 0649 11/26/13 0747 11/27/13 0610  HGB  --  9.4*  --   HCT  --  28.8*  --   PLT  --  174  --   LABPROT 24.0* 23.0* 25.8*  INR 2.15* 2.04* 2.36*  CREATININE  --  1.79* 1.80*    Estimated Creatinine Clearance: 31.6 ml/min (by C-G formula based on Cr of 1.8).   Medications:  Scheduled:  . amiodarone  200 mg Oral Daily  . amLODipine  2.5 mg Oral Daily  . aspirin EC  81 mg Oral Daily  . chlorthalidone  25 mg Oral Daily  . doxycycline  100 mg Oral Q12H  . feeding supplement (GLUCERNA SHAKE)  237 mL Oral BID BM  . ferrous sulfate  325 mg Oral TID WC  . insulin aspart  0-5 Units Subcutaneous QHS  . senna-docusate  2 tablet Oral BID  . simvastatin  10 mg Oral q1800  . Warfarin - Pharmacist Dosing Inpatient   Does not apply q1800   Infusions:    Assessment: 80 yo male with afib and atrial thrombus is currently on therapeutic coumadin.  INR today is 2.36.  Patient is still on doxycyline and also amiodarone which can affect INR. Goal of Therapy:  INR 2-3 Monitor platelets by anticoagulation protocol: Yes   Plan:  1. Coumadin 1.5 mg po x1 2. Daily INR if not discharge   Linzy Darling, Tsz-Yin 11/27/2013,8:12 AM

## 2013-11-27 NOTE — Progress Notes (Signed)
Occupational Therapy Discharge Summary  Patient Details  Name: [name] MRN: [id number] Date of Birth: 02-May-1933  Patient has met 6 of 9 long term goals due to improved activity tolerance, improved balance and ability to compensate for deficits.  Patient to discharge at [facility] Assist level.  Patient's care partner requires assistance to provide the necessary physical and cognitive assistance at discharge which results in recommended discharge to skilled nursing facility.    Reasons goals not met: Reach to lower body and buttocks (for toilet hygiene) limited by edema at scrotum, unresolved, complicated by ongoing wound care needs for LLE, residual weakness at RUE s/p CVA, and impaired dynamic standing balance.  Recommendation:  Patient may not benefit form skilled OT services in skilled nursing facility setting to continue to advance functional skills in the area of BADL due to reaching highest potential during inpatient rehab admission.  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 Perception Comments: consistently misjudges distances likely related to visual impairment (partial blindness)   Cognition Overall Cognitive Status:  Impaired/Different from baseline Arousal/Alertness: Awake/alert Orientation Level: Oriented to person;Oriented to place;Oriented to situation Attention: Selective Selective Attention: Appears intact Alternating Attention: Impaired Alternating Attention Impairment: Functional complex Divided Attention: Impaired Divided Attention Impairment: Functional complex Memory: Impaired Memory Impairment: Decreased recall of new information;Decreased short term memory Decreased Short Term Memory: Verbal complex Awareness: Impaired Awareness Impairment: Emergent impairment Problem Solving: Impaired Problem Solving Impairment: Functional basic Executive Function: Sequencing Sequencing: Impaired Sequencing Impairment: Functional basic Safety/Judgment: Impaired  Sensation Sensation Light Touch: Impaired by gross assessment;Impaired Detail Light Touch Impaired Details: Impaired RUE;Impaired LUE;Impaired RLE;Impaired LLE Stereognosis: Appears Intact Hot/Cold: Appears Intact Proprioception Impaired Details: Impaired RLE Additional Comments: UE sensation adequate for performance of BADL Coordination Gross Motor Movements are Fluid and Coordinated: Yes Fine Motor Movements are Fluid and Coordinated: Yes  Motor  Motor Motor: Abnormal postural alignment and control Motor - Discharge Observations: slouched posture and retropulsive in sit due to swollen scrotum and chronic sitting posture  Mobility  Bed Mobility Bed Mobility: Supine to Sit Rolling Right: 4: Min assist Supine to Sit: 4: Min guard Sit to Supine: 4: Min guard Transfers Transfers: Sit to Stand;Stand to Sit Sit to Stand: 4: Min guard Sit to Stand Details: Verbal cues for technique Stand to Sit: 4: Min guard   Trunk/Postural Assessment  Cervical Assessment Cervical Assessment: Within Functional Limits Thoracic Assessment Thoracic Assessment: Within Functional Limits Lumbar Assessment Lumbar Assessment: Within Functional  Limits Postural Control Postural Control: Deficits on evaluation Postural Limitations: chronic kyphotic and forward head posture, sacral sitting due to swollen scrotum   Balance Balance Balance Assessed: Yes Static Sitting Balance Static Sitting - Balance Support: Bilateral upper extremity supported;Feet supported Static Sitting - Level of Assistance: 5: Stand by assistance Dynamic Sitting Balance Dynamic Sitting - Balance Support: Bilateral upper extremity supported;Feet supported Dynamic Sitting - Level of Assistance: 5: Stand by assistance Dynamic  Sitting - Balance Activities: Reaching for objects Static Standing Balance Static Standing - Balance Support: During functional activity;Bilateral upper extremity supported Static Standing - Level of Assistance: 4: Min assist Dynamic Standing Balance Dynamic Standing - Balance Support: During functional activity;Bilateral upper extremity supported Dynamic Standing - Level of Assistance: 4: Min assist Dynamic Standing - Balance Activities: Forward lean/weight shifting;Lateral lean/weight shifting  Extremity/Trunk Assessment RUE Assessment RUE Assessment: Exceptions to [facility] RUE Strength RUE Overall Strength: Deficits;Due to premorbid status RUE Overall Strength Comments: 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

## 2013-11-28 ENCOUNTER — Encounter: Payer: Self-pay | Admitting: Internal Medicine

## 2013-11-28 ENCOUNTER — Non-Acute Institutional Stay (SKILLED_NURSING_FACILITY): Payer: Medicare Other | Admitting: Internal Medicine

## 2013-11-28 DIAGNOSIS — I313 Pericardial effusion (noninflammatory): Secondary | ICD-10-CM

## 2013-11-28 DIAGNOSIS — L97529 Non-pressure chronic ulcer of other part of left foot with unspecified severity: Secondary | ICD-10-CM

## 2013-11-28 DIAGNOSIS — I3139 Other pericardial effusion (noninflammatory): Secondary | ICD-10-CM

## 2013-11-28 DIAGNOSIS — I5189 Other ill-defined heart diseases: Secondary | ICD-10-CM

## 2013-11-28 DIAGNOSIS — L97509 Non-pressure chronic ulcer of other part of unspecified foot with unspecified severity: Secondary | ICD-10-CM

## 2013-11-28 DIAGNOSIS — R5381 Other malaise: Secondary | ICD-10-CM

## 2013-11-28 DIAGNOSIS — I639 Cerebral infarction, unspecified: Secondary | ICD-10-CM

## 2013-11-28 DIAGNOSIS — I319 Disease of pericardium, unspecified: Secondary | ICD-10-CM

## 2013-11-28 DIAGNOSIS — I513 Intracardiac thrombosis, not elsewhere classified: Secondary | ICD-10-CM

## 2013-11-28 DIAGNOSIS — E119 Type 2 diabetes mellitus without complications: Secondary | ICD-10-CM

## 2013-11-28 DIAGNOSIS — I4891 Unspecified atrial fibrillation: Secondary | ICD-10-CM

## 2013-11-28 DIAGNOSIS — N183 Chronic kidney disease, stage 3 unspecified: Secondary | ICD-10-CM

## 2013-11-28 DIAGNOSIS — N5089 Other specified disorders of the male genital organs: Secondary | ICD-10-CM

## 2013-11-28 DIAGNOSIS — I6350 Cerebral infarction due to unspecified occlusion or stenosis of unspecified cerebral artery: Secondary | ICD-10-CM

## 2013-11-28 DIAGNOSIS — D638 Anemia in other chronic diseases classified elsewhere: Secondary | ICD-10-CM
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
## 2013-11-29 ENCOUNTER — Encounter: Payer: Self-pay | Admitting: Internal Medicine

## 2013-11-29 DIAGNOSIS — I513 Intracardiac thrombosis, not elsewhere classified: Secondary | ICD-10-CM | POA: Insufficient documentation

## 2013-11-29 NOTE — Assessment & Plan Note (Signed)
amiodarone and lopressor , coumadin for A fib and thrombus

## 2013-11-29 NOTE — Assessment & Plan Note (Signed)
Inguinal hernia into scrotum-stable

## 2013-11-29 NOTE — Assessment & Plan Note (Signed)
Hb at d/c 9.4; pt is on iron

## 2013-11-29 NOTE — Assessment & Plan Note (Signed)
LLE, PVD, treated with 7 days doxycycline in hosp;continue wound care in SNF

## 2013-11-29 NOTE — Assessment & Plan Note (Signed)
S/p window for recurrent pericardial effusion

## 2013-11-29 NOTE — Assessment & Plan Note (Signed)
Pt was rehabbed in the hospital and continues rehab at [facility]

## 2013-11-29 NOTE — Assessment & Plan Note (Signed)
Noted no deficits

## 2013-11-29 NOTE — Assessment & Plan Note (Signed)
Dx at time of pericardial effusion;on chronic coumadin INR 2-3

## 2013-11-29 NOTE — Assessment & Plan Note (Signed)
D/c Cr  1.79 with stated baseline 1.9

## 2013-11-29 NOTE — Assessment & Plan Note (Signed)
A1c 5.9 with mealtime novolog

## 2013-12-01 ENCOUNTER — Non-Acute Institutional Stay (SKILLED_NURSING_FACILITY): Payer: Medicare Other | Admitting: Internal Medicine

## 2013-12-01 ENCOUNTER — Encounter: Payer: Self-pay | Admitting: Internal Medicine

## 2013-12-01 ENCOUNTER — Ambulatory Visit: Payer: Self-pay

## 2013-12-01 DIAGNOSIS — N183 Chronic kidney disease, stage 3 unspecified: Secondary | ICD-10-CM

## 2013-12-01 DIAGNOSIS — I4891 Unspecified atrial fibrillation: Secondary | ICD-10-CM

## 2013-12-01 DIAGNOSIS — E876 Hypokalemia: Secondary | ICD-10-CM

## 2013-12-01 DIAGNOSIS — D638 Anemia in other chronic diseases classified elsewhere: Secondary | ICD-10-CM
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
## 2013-12-03 ENCOUNTER — Other Ambulatory Visit: Payer: Self-pay | Admitting: *Deleted

## 2013-12-03 ENCOUNTER — Telehealth: Payer: Self-pay | Admitting: *Deleted

## 2013-12-03 MED ORDER — OXYCODONE HCL 5 MG PO TABS: ORAL_TABLET | ORAL | Status: DC

## 2013-12-03 NOTE — Telephone Encounter (Signed)
[facility] 

## 2013-12-06 LAB — FUNGUS CULTURE W SMEAR: FUNGAL SMEAR: NONE SEEN

## 2013-12-21 LAB — AFB CULTURE WITH SMEAR (NOT AT ARMC): Acid Fast Smear: NONE SEEN

## 2013-12-24 ENCOUNTER — Telehealth: Payer: Self-pay | Admitting: *Deleted

## 2013-12-25 ENCOUNTER — Ambulatory Visit: Payer: Medicare Other | Admitting: Cardiothoracic Surgery

## 2013-12-30 ENCOUNTER — Ambulatory Visit
Admission: RE | Admit: 2013-12-30 | Discharge: 2013-12-30 | Disposition: A | Discharge status: Home / Self Care | Payer: Medicare Other | Source: Ambulatory Visit | Attending: Cardiothoracic Surgery | Admitting: Cardiothoracic Surgery

## 2013-12-30 ENCOUNTER — Ambulatory Visit (INDEPENDENT_AMBULATORY_CARE_PROVIDER_SITE_OTHER): Payer: Self-pay | Admitting: Cardiothoracic Surgery

## 2013-12-30 ENCOUNTER — Encounter: Payer: Self-pay | Admitting: Cardiothoracic Surgery

## 2013-12-30 ENCOUNTER — Ambulatory Visit: Payer: Medicare Other | Admitting: Cardiothoracic Surgery

## 2013-12-30 VITALS — BP 131/57 | HR 64 | Ht 67.0 in | Wt 164.0 lb

## 2013-12-30 DIAGNOSIS — I3139 Other pericardial effusion (noninflammatory): Secondary | ICD-10-CM

## 2013-12-30 DIAGNOSIS — I319 Disease of pericardium, unspecified: Secondary | ICD-10-CM

## 2013-12-30 DIAGNOSIS — I313 Pericardial effusion (noninflammatory): Secondary | ICD-10-CM

## 2013-12-30 NOTE — Progress Notes (Signed)
[address]       [place] [id number]             [phone]      Vista LawmanRobert Trulson [facility] Medical Record #[id number] Date of Birth: 05/13/1933  Referring: Lennette BihariKelly, Thomas A, MD Primary Care: Dorrene GermanAVBUERE,EDWIN A, MD  Chief Complaint:   POST OP FOLLOW UP 11/07/2013  OPERATIVE REPORT  PREOPERATIVE DIAGNOSIS: Persistent pericardial effusion with echo  evidence of tamponade.  POSTOPERATIVE DIAGNOSES: Persistent pericardial effusion with echo  evidence of tamponade with additional diagnosis of left atrial clot  based on transesophageal echo.  PROCEDURE PERFORMED:  1. Subxiphoid pericardial window with drainage of pericardial  effusion.  2. Transesophageal echo.  SURGEON: Sheliah PlaneEdward Piercen Covino, MD  History of Present Illness:     [facility] Comes in today for wound check following subxiphoid pericardial window with drainage of pericardial effusion.August 7. At that time path showed Pericardium, biopsy - REACTIVE MESOTHELIAL CELLS WITH SLIGHT INFLAMMATION AND FIBRIN DEPOSITION. - NO MALIGNANCY IDENTIFIED. Patient continues to do recently while still has a Foley catheter in place.    Past Medical History  Diagnosis Date  . Diabetes mellitus without complication   . Hypertension   . CKD (chronic kidney disease) stage 3, GFR 30-59 ml/min 08/01/2012  . Hypoglycemia 07/26/2012  . Anemia 08/01/2012  . CVA (cerebral vascular accident)   . Pericardial effusion     750 cc drained 11/03/2013--window 11/07/13  . Peripheral arterial disease     status post left SFA stenting by myself for critical limb ischemia 02/10/10     History  Smoking status  . Never Smoker   Smokeless tobacco  . Never Used    History  Alcohol Use No     No Known Allergies  Current Outpatient Prescriptions  Medication Sig Dispense Refill  . amiodarone (PACERONE) 400 MG tablet Take 200 mg by mouth daily.      Marland Kitchen. amLODipine (NORVASC) 5 MG  tablet Take 2.5 mg by mouth daily.      Marland Kitchen. aspirin EC 81 MG EC tablet Take 1 tablet (81 mg total) by mouth daily.  90 tablet  1  . BD INSULIN SYRINGE ULTRAFINE 31G X 5/16" 0.3 ML MISC       . furosemide (LASIX) 20 MG tablet Take 20 mg by mouth.      Marland Kitchen. glucose 4 GM chewable tablet Chew 1 tablet by mouth as needed for low blood sugar.      . insulin aspart (NOVOLOG) 100 UNIT/ML injection Inject 0-9 Units into the skin 3 (three) times daily with meals.  10 mL  11  . oxyCODONE (OXY IR/ROXICODONE) 5 MG immediate release tablet Take one tablet by mouth every four hours as needed for pain; Take two tablets by mouth every four hours as needed for pain  360 tablet  0  . simvastatin (ZOCOR) 10 MG tablet Take 10 mg by mouth daily with lunch.      . traMADol (ULTRAM) 50 MG tablet Take 1 tablet (50 mg total) by mouth every 6 (six) hours as needed for moderate pain (mild pain).  30 tablet    . warfarin (COUMADIN) 1 MG tablet Take 1 mg by mouth daily. PT TO TAKE 1/2 TAB PO ALONG W/ 1 MG TO =TO 1.5 MG QD      . insulin aspart (NOVOLOG) 100 UNIT/ML injection Inject 0-5 Units into the skin at bedtime.  10  mL  11  . ondansetron (ZOFRAN) 4 MG/2ML SOLN injection Inject 2 mLs (4 mg total) into the vein every 6 (six) hours as needed for nausea or vomiting.  2 mL  0   No current facility-administered medications for this visit.       Physical Exam: BP 131/57  Pulse 64  Ht 5\' 7"  (1.702 m)  Wt 164 lb (74.39 kg)  BMI 25.68 kg/m2  SpO2 98%  General appearance: alert, cooperative and appears older than stated age Neurologic: intact Heart: Irregularrate and rhythm, S1, S2 normal, no murmur, click, rub or gallop Lungs: diminished breath sounds bibasilar Abdomen: soft, non-tender; bowel sounds normal; no masses,  no organomegaly Extremities: extremities normal, atraumatic, no cyanosis or edema and Homans sign is negative, no sign of DVT, patient has superficial abrasions and thin bruised skin on both  extremities Wound: Patient's subxiphoid pericardial window incision is well-healed chest tube suture was removed   Diagnostic Studies & Laboratory data:     Recent Radiology Findings:   Dg Chest 2 View  12/30/2013   CLINICAL DATA:  Shortness of breath; previous creation of pericardial window; history of diabetes and hypertension and chronic renal insufficiency  EXAM: CHEST  2 VIEW  COMPARISON:  PA and lateral chest x-ray of August 20th 2015  FINDINGS: The lungs are well-expanded. There is no focal infiltrate. Previously demonstrated lower lobe densities are no longer evident. The heart and pulmonary vascularity exhibit no acute abnormalities. The mediastinum is normal in width. The bony thorax is unremarkable. There are degenerative changes of the shoulders.  IMPRESSION: There is no evidence of pneumonia nor CHF nor other acute cardiopulmonary abnormality.   Electronically Signed   By: David  [place]   On: 12/30/2013 10:36      Recent Lab Findings: Lab Results  Component Value Date   WBC 6.8 11/26/2013   HGB 9.4* 11/26/2013   HCT 28.8* 11/26/2013   PLT 174 11/26/2013   GLUCOSE 125* 11/27/2013   CHOL 83 10/01/2013   TRIG 58 10/01/2013   HDL 29* 10/01/2013   LDLCALC 42 10/01/2013   ALT 7 11/14/2013   AST 9 11/14/2013   NA 131* 11/27/2013   K 4.0 11/27/2013   CL 86* 11/27/2013   CREATININE 1.80* 11/27/2013   BUN 46* 11/27/2013   CO2 36* 11/27/2013   TSH 1.280 09/30/2013   INR 2.36* 11/27/2013   HGBA1C 5.9* 11/02/2013      Assessment / Plan:     Patient stable following drainage of pericardial effusion continues to have multiple medical problems including chronic anticoagulation peripheral vascular disease chronic and renal insufficiency with Foley catheter in place I've not made him a return appointment to the surgical office but would be glad to see him at cardiology request.      Delight Ovens MD      [address] [place] [place] [address] Office [phone]   Beeper  [phone]  12/30/2013 11:24 AM

## 2013-12-31 ENCOUNTER — Non-Acute Institutional Stay (SKILLED_NURSING_FACILITY): Payer: Medicare Other | Admitting: Internal Medicine

## 2013-12-31 DIAGNOSIS — N390 Urinary tract infection, site not specified: Secondary | ICD-10-CM | POA: Insufficient documentation

## 2013-12-31 DIAGNOSIS — I4891 Unspecified atrial fibrillation: Secondary | ICD-10-CM

## 2013-12-31 DIAGNOSIS — N5089 Other specified disorders of the male genital organs: Secondary | ICD-10-CM

## 2013-12-31 DIAGNOSIS — N183 Chronic kidney disease, stage 3 unspecified: Secondary | ICD-10-CM

## 2013-12-31 DIAGNOSIS — N3001 Acute cystitis with hematuria: Secondary | ICD-10-CM

## 2013-12-31 DIAGNOSIS — D638 Anemia in other chronic diseases classified elsewhere: Secondary | ICD-10-CM

## 2013-12-31 DIAGNOSIS — N3000 Acute cystitis without hematuria: Secondary | ICD-10-CM
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
## 2013-12-31 NOTE — Assessment & Plan Note (Addendum)
Probable-pt started empirically on macrobid which I  don't agree with because of abx resistance and Urine  for C and S/MIC is pending; today 10/2) pt's urine looks much clearer, no to minimal blood tinge; still waiting MIC

## 2014-01-04 ENCOUNTER — Encounter (HOSPITAL_COMMUNITY): Payer: Self-pay | Admitting: Emergency Medicine

## 2014-01-04 ENCOUNTER — Emergency Department (HOSPITAL_COMMUNITY)
Admission: EM | Admit: 2014-01-04 | Discharge: 2014-01-04 | Disposition: A | Discharge status: Home / Self Care | Payer: Medicare Other | Attending: Emergency Medicine | Admitting: Emergency Medicine

## 2014-01-04 ENCOUNTER — Encounter: Payer: Self-pay | Admitting: Internal Medicine

## 2014-01-04 DIAGNOSIS — E119 Type 2 diabetes mellitus without complications: Secondary | ICD-10-CM | POA: Diagnosis not present

## 2014-01-04 DIAGNOSIS — Z7982 Long term (current) use of aspirin: Secondary | ICD-10-CM | POA: Insufficient documentation

## 2014-01-04 DIAGNOSIS — K4090 Unilateral inguinal hernia, without obstruction or gangrene, not specified as recurrent: Secondary | ICD-10-CM | POA: Insufficient documentation

## 2014-01-04 DIAGNOSIS — D649 Anemia, unspecified: Secondary | ICD-10-CM | POA: Diagnosis not present

## 2014-01-04 DIAGNOSIS — N183 Chronic kidney disease, stage 3 (moderate): Secondary | ICD-10-CM | POA: Diagnosis not present

## 2014-01-04 DIAGNOSIS — Z8673 Personal history of transient ischemic attack (TIA), and cerebral infarction without residual deficits: Secondary | ICD-10-CM | POA: Insufficient documentation

## 2014-01-04 DIAGNOSIS — I129 Hypertensive chronic kidney disease with stage 1 through stage 4 chronic kidney disease, or unspecified chronic kidney disease: Secondary | ICD-10-CM | POA: Insufficient documentation

## 2014-01-04 DIAGNOSIS — Z7901 Long term (current) use of anticoagulants: Secondary | ICD-10-CM | POA: Insufficient documentation

## 2014-01-04 DIAGNOSIS — R300 Dysuria: Secondary | ICD-10-CM | POA: Diagnosis present

## 2014-01-04 DIAGNOSIS — I739 Peripheral vascular disease, unspecified: Secondary | ICD-10-CM | POA: Insufficient documentation

## 2014-01-04 DIAGNOSIS — R4182 Altered mental status, unspecified: Secondary | ICD-10-CM | POA: Insufficient documentation

## 2014-01-04 DIAGNOSIS — N390 Urinary tract infection, site not specified: Secondary | ICD-10-CM | POA: Diagnosis not present

## 2014-01-04 DIAGNOSIS — R319 Hematuria, unspecified: Secondary | ICD-10-CM

## 2014-01-04 DIAGNOSIS — R339 Retention of urine, unspecified: Secondary | ICD-10-CM

## 2014-01-04 DIAGNOSIS — Z792 Long term (current) use of antibiotics: Secondary | ICD-10-CM | POA: Insufficient documentation

## 2014-01-04 LAB — URINALYSIS, ROUTINE W REFLEX MICROSCOPIC
Bilirubin Urine: NEGATIVE
Glucose, UA: NEGATIVE mg/dL
Ketones, ur: NEGATIVE mg/dL
NITRITE: NEGATIVE
Protein, ur: 30 mg/dL — AB
SPECIFIC GRAVITY, URINE: 1.013 (ref 1.005–1.030)
UROBILINOGEN UA: 0.2 mg/dL (ref 0.0–1.0)
pH: 7.5 (ref 5.0–8.0)

## 2014-01-04 LAB — TYPE AND SCREEN
ABO/RH(D): O POS
ANTIBODY SCREEN: NEGATIVE

## 2014-01-04 LAB — COMPREHENSIVE METABOLIC PANEL
ALBUMIN: 2.4 g/dL — AB (ref 3.5–5.2)
ALT: 14 U/L (ref 0–53)
AST: 16 U/L (ref 0–37)
Alkaline Phosphatase: 171 U/L — ABNORMAL HIGH (ref 39–117)
Anion gap: 12 (ref 5–15)
BUN: 46 mg/dL — ABNORMAL HIGH (ref 6–23)
CALCIUM: 9.2 mg/dL (ref 8.4–10.5)
CO2: 27 meq/L (ref 19–32)
Chloride: 98 mEq/L (ref 96–112)
Creatinine, Ser: 1.75 mg/dL — ABNORMAL HIGH (ref 0.50–1.35)
GFR calc Af Amer: 41 mL/min — ABNORMAL LOW (ref >90)
GFR calc non Af Amer: 36 mL/min — ABNORMAL LOW (ref >90)
Glucose, Bld: 156 mg/dL — ABNORMAL HIGH (ref 70–99)
Potassium: 4.4 mEq/L (ref 3.7–5.3)
SODIUM: 137 meq/L (ref 137–147)
TOTAL PROTEIN: 6.9 g/dL (ref 6.0–8.3)
Total Bilirubin: 1 mg/dL (ref 0.3–1.2)

## 2014-01-04 LAB — CBC
HCT: 37.5 % — ABNORMAL LOW (ref 39.0–52.0)
Hemoglobin: 12.4 g/dL — ABNORMAL LOW (ref 13.0–17.0)
MCH: 28 pg (ref 26.0–34.0)
MCHC: 33.1 g/dL (ref 30.0–36.0)
MCV: 84.7 fL (ref 78.0–100.0)
Platelets: 146 10*3/uL — ABNORMAL LOW (ref 150–400)
RBC: 4.43 MIL/uL (ref 4.22–5.81)
RDW: 15.4 % (ref 11.5–15.5)
WBC: 6.6 10*3/uL (ref 4.0–10.5)

## 2014-01-04 LAB — URINE MICROSCOPIC-ADD ON

## 2014-01-04 MED ORDER — CEPHALEXIN 500 MG PO CAPS: 500.0000 mg | ORAL_CAPSULE | Freq: Four times a day (QID) | ORAL | Status: DC

## 2014-01-04 NOTE — ED Notes (Signed)
Pt arrives via EMS from [facility]. Began c/o groin pain yesterday. Pt normally has indwelling urinary catheter. Pt began having increasing pain so urinary catheter was removed by NH staff. They noted blood coming from his penis. Concerned because patient is on coumadin. Bleeding controlled by EMS arrival. Last dose of 5mg  oxycodone given at 0800 for pain.

## 2014-01-04 NOTE — Assessment & Plan Note (Signed)
Pt's Hb improved to 12.4; pt's PLT slt  low at 146

## 2014-01-04 NOTE — Assessment & Plan Note (Signed)
Pt's rate has been controlled and ho sx of CP or SOB. Pt was seen by [facility] and released with a handwritten note that pt needs to see Cardiology and PCP stat. I have read through surgeons note and do not see any problems. Pt has been set up for cards and has seen me this week.

## 2014-01-04 NOTE — Discharge Instructions (Signed)
Catheter-Associated Urinary Tract Infection FAQs °WHAT IS "CATHETER-ASSOCIATED" URINARY TRACT INFECTION? °A urinary tract infection (also called "UTI") is an infection in the urinary system, which includes the bladder (which stores the urine) and the kidneys (which filter the blood to make urine). Germs (for example, bacteria or yeasts) do not normally live in these areas; but if germs are introduced, an infection can occur. If you have a urinary catheter, germs can travel along the catheter and cause an infection in your bladder or your kidney; in that case it is called a catheter-associated urinary tract infection (or "CA-UTI").  °WHAT IS A URINARY CATHETER? °A urinary catheter is a thin tube placed in the bladder to drain urine. Urine drains through the tube into a bag that collects the urine. A urinary  °catheter may be used: °· If you are not able to urinate on your own. °· To measure the amount of urine that you make, for example, during intensive care. °· During and after some types of surgery. °· During some tests of the kidneys and bladder . °People with urinary catheters have a much higher chance of getting a urinary tract infection than people who don't have a catheter. °HOW DO I GET A CATHETER-ASSOCIATED URINARY TRACT INFECTION (CA-UTI)? °If germs enter the urinary tract, they may cause an infection. Many of the germs that cause a catheter-associated urinary tract infection are common germs found in your intestines that do not usually cause an infection there. Germs can enter the urinary tract when the catheter is being put in or while the catheter remains in the bladder.  °WHAT ARE THE SYMPTOMS OF A URINARY TRACT INFECTION?  °Some of the common symptoms of a urinary tract infection are: °· Burning or pain in the lower abdomen (that is, below the stomach). °· Fever. °· Bloody urine may be a sign of infection, but is also caused by other problems . °· Burning during urination or an increase in the  frequency of urination after the catheter is removed. °Sometimes people with catheter-associated urinary tract infections do not have these symptoms of infection. °CAN CATHETER-ASSOCIATED URINARY TRACT INFECTIONS BE TREATED? °Yes, most catheter-associated urinary tract infections can be treated with antibiotics and removal or change of the catheter. Your doctor will determine which antibiotic is best for you.  °WHAT ARE SOME OF THE THINGS THAT HOSPITALS ARE DOING TO PREVENT CATHETER-ASSOCIATED URINARY TRACT INFECTIONS? °To prevent urinary tract infections, doctors and nurses take the following actions.  °Catheter insertion °· Catheters are put in only when necessary and they are removed as soon as possible. °· Only properly trained persons insert catheters using sterile ("clean") technique. °· The skin in the area where the catheter will be inserted is cleaned before inserting the catheter. °· Other methods to drain the urine are sometimes used, such as: °¨ External catheters in men (these look like condoms and are placed over the penis rather than into the penis) °¨ Putting a temporary catheter in to drain the urine and removing it right away. This is called intermittent urethral catheterization. °Catheter care °· Healthcare providers clean their hands by washing them with soap and water or using an alcohol-based hand rub before and after touching your catheter. °¨ If you do not see your providers clean their hands, please ask them to do so. °· Avoid disconnecting the catheter and drain tube. This helps to prevent germs from getting into the catheter tube. °· The catheter is secured to the leg to prevent pulling on the   catheter.  Avoid twisting or kinking the catheter.  Keep the bag lower than the bladder to prevent urine from backflowing to the bladder.  Empty the bag regularly. The drainage spout should not touch anything while emptying the bag. WHAT CAN I DO TO HELP PREVENT CATHETER-ASSOCIATED URINARY  TRACT INFECTIONS IF I HAVE A CATHETER?  Always clean your hands before and after doing catheter care.  Always keep your urine bag below the level of your bladder.  Do not tug or pull on the tubing.  Do not twist or kink the catheter tubing.  Ask your healthcare provider each day if you still need the catheter. WHAT DO I NEED TO DO WHEN I GO HOME FROM THE HOSPITAL?  If you will be going home with a catheter, your doctor or nurse should explain everything you need to know about taking care of the catheter. Make sure you understand how to care for it before you leave the hospital.  If you develop any of the symptoms of a urinary tract infection, such as burning or pain in the lower abdomen, fever, or an increase in the frequency of urination, contact your doctor or nurse immediately.  Before you go home, make sure you know who to contact if you have questions or problems after you get home. If you have questions, please ask your doctor or nurse. Developed and co-sponsored by [employer] for [employer] [place] [phone]); Infectious Diseases Society of America (IDSA); [facility] Association; Association for Professionals in Infection Control and Epidemiology (APIC); Center for Disease Control (CDC); and The Joint Commission Document Released: 12/13/2011 Document Reviewed: 12/13/2011 [facility] Patient Information 2015 [place], [place]. This information is not intended to replace advice given to you by your health care provider. Make sure you discuss any questions you have with your health care provider.  Hematuria Hematuria is blood in your urine. It can be caused by a bladder infection, kidney infection, prostate infection, kidney stone, or cancer of your urinary tract. Infections can usually be treated with medicine, and a kidney stone usually will pass through your urine. If neither of these is the cause of your hematuria, further workup to find out the reason may be  needed. It is very important that you tell your health care provider about any blood you see in your urine, even if the blood stops without treatment or happens without causing pain. Blood in your urine that happens and then stops and then happens again can be a symptom of a very serious condition. Also, pain is not a symptom in the initial stages of many urinary cancers. HOME CARE INSTRUCTIONS   Drink lots of fluid, 3-4 quarts a day. If you have been diagnosed with an infection, cranberry juice is especially recommended, in addition to large amounts of water.  Avoid caffeine, tea, and carbonated beverages because they tend to irritate the bladder.  Avoid alcohol because it may irritate the prostate.  Take all medicines as directed by your health care provider.  If you were prescribed an antibiotic medicine, finish it all even if you start to feel better.  If you have been diagnosed with a kidney stone, follow your health care provider's instructions regarding straining your urine to catch the stone.  Empty your bladder often. Avoid holding urine for long periods of time.  After a bowel movement, women should cleanse front to back. Use each tissue only once.  Empty your bladder before and after sexual intercourse if you are a male.  SEEK MEDICAL CARE IF:  You develop back pain.  You have a fever.  You have a feeling of sickness in your stomach (nausea) or vomiting.  Your symptoms are not better in 3 days. Return sooner if you are getting worse. SEEK IMMEDIATE MEDICAL CARE IF:   You develop severe vomiting and are unable to keep the medicine down.  You develop severe back or abdominal pain despite taking your medicines.  You begin passing a large amount of blood or clots in your urine.  You feel extremely weak or faint, or you pass out. MAKE SURE YOU:   Understand these instructions.  Will watch your condition.  Will get help right away if you are not doing well or get  worse. Document Released: 03/20/2005 Document Revised: 08/04/2013 Document Reviewed: 11/18/2012 [facility] Patient Information 2015 [place]. This information is not intended to replace advice given to you by your health care provider. Make sure you discuss any questions you have with your health care provider.

## 2014-01-04 NOTE — Assessment & Plan Note (Signed)
L inguinal hernia persists and is stable.
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
## 2014-01-04 NOTE — Assessment & Plan Note (Signed)
10/4 BUN 46/Cr 1.75, with GFR 36 - baseline , no changes

## 2014-01-06 LAB — URINE CULTURE
Colony Count: NO GROWTH
Culture: NO GROWTH
SPECIAL REQUESTS: NORMAL

## 2014-01-29 ENCOUNTER — Non-Acute Institutional Stay (SKILLED_NURSING_FACILITY): Payer: Medicare Other | Admitting: Internal Medicine

## 2014-01-29 ENCOUNTER — Encounter: Payer: Self-pay | Admitting: Internal Medicine

## 2014-01-29 DIAGNOSIS — N183 Chronic kidney disease, stage 3 unspecified: Secondary | ICD-10-CM

## 2014-01-29 DIAGNOSIS — N508 Other specified disorders of male genital organs: Secondary | ICD-10-CM

## 2014-01-29 DIAGNOSIS — R600 Localized edema: Secondary | ICD-10-CM

## 2014-01-29 DIAGNOSIS — I4891 Unspecified atrial fibrillation: Secondary | ICD-10-CM

## 2014-01-29 DIAGNOSIS — I10 Essential (primary) hypertension: Secondary | ICD-10-CM

## 2014-01-29 DIAGNOSIS — N5089 Other specified disorders of the male genital organs: Secondary | ICD-10-CM
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
## 2014-01-30 ENCOUNTER — Non-Acute Institutional Stay (SKILLED_NURSING_FACILITY): Payer: Medicare Other | Admitting: Internal Medicine

## 2014-01-30 DIAGNOSIS — N508 Other specified disorders of male genital organs: Secondary | ICD-10-CM

## 2014-01-30 DIAGNOSIS — N5089 Other specified disorders of the male genital organs: Secondary | ICD-10-CM

## 2014-01-30 NOTE — Assessment & Plan Note (Signed)
Chronic and stable;no signs of strangulation.pt c/o did hurt last night to pee (pt has foley in) but doesn't hurt now. Pt txed for UTI in early October. Urine looks a little cloudy today, have ordered U/A with C and S
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
## 2014-02-02 ENCOUNTER — Non-Acute Institutional Stay (SKILLED_NURSING_FACILITY): Payer: Medicare Other | Admitting: Internal Medicine

## 2014-02-02 ENCOUNTER — Encounter (HOSPITAL_COMMUNITY): Payer: Self-pay | Admitting: Emergency Medicine

## 2014-02-02 ENCOUNTER — Inpatient Hospital Stay (HOSPITAL_COMMUNITY)
Admission: EM | Admit: 2014-02-02 | Discharge: 2014-02-10 | DRG: 698 | Disposition: A | Discharge status: Skilled Nursing Facility | Payer: Medicare Other | Attending: Internal Medicine | Admitting: Internal Medicine

## 2014-02-02 ENCOUNTER — Emergency Department (HOSPITAL_COMMUNITY): Payer: Medicare Other

## 2014-02-02 ENCOUNTER — Inpatient Hospital Stay (HOSPITAL_COMMUNITY): Payer: Medicare Other

## 2014-02-02 DIAGNOSIS — E119 Type 2 diabetes mellitus without complications: Secondary | ICD-10-CM

## 2014-02-02 DIAGNOSIS — N400 Enlarged prostate without lower urinary tract symptoms: Secondary | ICD-10-CM | POA: Diagnosis present

## 2014-02-02 DIAGNOSIS — D649 Anemia, unspecified: Secondary | ICD-10-CM | POA: Diagnosis present

## 2014-02-02 DIAGNOSIS — R600 Localized edema: Secondary | ICD-10-CM

## 2014-02-02 DIAGNOSIS — Z7982 Long term (current) use of aspirin: Secondary | ICD-10-CM

## 2014-02-02 DIAGNOSIS — L89610 Pressure ulcer of right heel, unstageable: Secondary | ICD-10-CM | POA: Diagnosis present

## 2014-02-02 DIAGNOSIS — D508 Other iron deficiency anemias: Secondary | ICD-10-CM

## 2014-02-02 DIAGNOSIS — N390 Urinary tract infection, site not specified: Secondary | ICD-10-CM | POA: Diagnosis present

## 2014-02-02 DIAGNOSIS — I672 Cerebral atherosclerosis: Secondary | ICD-10-CM | POA: Diagnosis present

## 2014-02-02 DIAGNOSIS — N189 Chronic kidney disease, unspecified: Secondary | ICD-10-CM | POA: Diagnosis not present

## 2014-02-02 DIAGNOSIS — L89159 Pressure ulcer of sacral region, unspecified stage: Secondary | ICD-10-CM | POA: Diagnosis present

## 2014-02-02 DIAGNOSIS — N136 Pyonephrosis: Secondary | ICD-10-CM | POA: Diagnosis present

## 2014-02-02 DIAGNOSIS — Y846 Urinary catheterization as the cause of abnormal reaction of the patient, or of later complication, without mention of misadventure at the time of the procedure: Secondary | ICD-10-CM | POA: Diagnosis present

## 2014-02-02 DIAGNOSIS — N179 Acute kidney failure, unspecified: Secondary | ICD-10-CM | POA: Diagnosis not present

## 2014-02-02 DIAGNOSIS — I5033 Acute on chronic diastolic (congestive) heart failure: Secondary | ICD-10-CM | POA: Diagnosis present

## 2014-02-02 DIAGNOSIS — N183 Chronic kidney disease, stage 3 (moderate): Secondary | ICD-10-CM | POA: Diagnosis present

## 2014-02-02 DIAGNOSIS — R0902 Hypoxemia: Secondary | ICD-10-CM

## 2014-02-02 DIAGNOSIS — K4090 Unilateral inguinal hernia, without obstruction or gangrene, not specified as recurrent: Secondary | ICD-10-CM | POA: Diagnosis present

## 2014-02-02 DIAGNOSIS — K8020 Calculus of gallbladder without cholecystitis without obstruction: Secondary | ICD-10-CM | POA: Diagnosis present

## 2014-02-02 DIAGNOSIS — I313 Pericardial effusion (noninflammatory): Secondary | ICD-10-CM | POA: Diagnosis present

## 2014-02-02 DIAGNOSIS — I4891 Unspecified atrial fibrillation: Secondary | ICD-10-CM | POA: Diagnosis present

## 2014-02-02 DIAGNOSIS — R319 Hematuria, unspecified: Secondary | ICD-10-CM | POA: Diagnosis not present

## 2014-02-02 DIAGNOSIS — H540 Blindness, both eyes: Secondary | ICD-10-CM | POA: Diagnosis present

## 2014-02-02 DIAGNOSIS — T8351XA Infection and inflammatory reaction due to indwelling urinary catheter, initial encounter: Principal | ICD-10-CM | POA: Diagnosis present

## 2014-02-02 DIAGNOSIS — A419 Sepsis, unspecified organism: Secondary | ICD-10-CM | POA: Diagnosis present

## 2014-02-02 DIAGNOSIS — N508 Other specified disorders of male genital organs: Secondary | ICD-10-CM

## 2014-02-02 DIAGNOSIS — R3129 Other microscopic hematuria: Secondary | ICD-10-CM | POA: Insufficient documentation

## 2014-02-02 DIAGNOSIS — R310 Gross hematuria: Secondary | ICD-10-CM | POA: Diagnosis present

## 2014-02-02 DIAGNOSIS — T8383XA Hemorrhage of genitourinary prosthetic devices, implants and grafts, initial encounter: Secondary | ICD-10-CM | POA: Diagnosis present

## 2014-02-02 DIAGNOSIS — I10 Essential (primary) hypertension: Secondary | ICD-10-CM | POA: Diagnosis not present

## 2014-02-02 DIAGNOSIS — I129 Hypertensive chronic kidney disease with stage 1 through stage 4 chronic kidney disease, or unspecified chronic kidney disease: Secondary | ICD-10-CM | POA: Diagnosis present

## 2014-02-02 DIAGNOSIS — D696 Thrombocytopenia, unspecified: Secondary | ICD-10-CM

## 2014-02-02 DIAGNOSIS — N323 Diverticulum of bladder: Secondary | ICD-10-CM | POA: Diagnosis present

## 2014-02-02 DIAGNOSIS — N5089 Other specified disorders of the male genital organs: Secondary | ICD-10-CM

## 2014-02-02 DIAGNOSIS — F0390 Unspecified dementia without behavioral disturbance: Secondary | ICD-10-CM | POA: Diagnosis present

## 2014-02-02 DIAGNOSIS — R791 Abnormal coagulation profile: Secondary | ICD-10-CM | POA: Diagnosis present

## 2014-02-02 DIAGNOSIS — F0150 Vascular dementia without behavioral disturbance: Secondary | ICD-10-CM | POA: Diagnosis present

## 2014-02-02 DIAGNOSIS — E872 Acidosis, unspecified: Secondary | ICD-10-CM

## 2014-02-02 DIAGNOSIS — D62 Acute posthemorrhagic anemia: Secondary | ICD-10-CM | POA: Diagnosis present

## 2014-02-02 DIAGNOSIS — Z7901 Long term (current) use of anticoagulants: Secondary | ICD-10-CM

## 2014-02-02 DIAGNOSIS — R579 Shock, unspecified: Secondary | ICD-10-CM

## 2014-02-02 DIAGNOSIS — G9341 Metabolic encephalopathy: Secondary | ICD-10-CM | POA: Diagnosis present

## 2014-02-02 DIAGNOSIS — R0600 Dyspnea, unspecified: Secondary | ICD-10-CM

## 2014-02-02 DIAGNOSIS — R6521 Severe sepsis with septic shock: Secondary | ICD-10-CM | POA: Diagnosis present

## 2014-02-02 DIAGNOSIS — R1030 Lower abdominal pain, unspecified: Secondary | ICD-10-CM

## 2014-02-02 DIAGNOSIS — Z8673 Personal history of transient ischemic attack (TIA), and cerebral infarction without residual deficits: Secondary | ICD-10-CM | POA: Diagnosis not present

## 2014-02-02 DIAGNOSIS — R102 Pelvic and perineal pain: Secondary | ICD-10-CM | POA: Insufficient documentation

## 2014-02-02 DIAGNOSIS — R34 Anuria and oliguria: Secondary | ICD-10-CM

## 2014-02-02 DIAGNOSIS — R3989 Other symptoms and signs involving the genitourinary system: Secondary | ICD-10-CM

## 2014-02-02 LAB — COMPREHENSIVE METABOLIC PANEL
ALBUMIN: 2.3 g/dL — AB (ref 3.5–5.2)
ALK PHOS: 114 U/L (ref 39–117)
ALT: 10 U/L (ref 0–53)
ANION GAP: 16 — AB (ref 5–15)
AST: 17 U/L (ref 0–37)
BILIRUBIN TOTAL: 1.2 mg/dL (ref 0.3–1.2)
BUN: 45 mg/dL — AB (ref 6–23)
CO2: 22 mEq/L (ref 19–32)
Calcium: 8.9 mg/dL (ref 8.4–10.5)
Chloride: 104 mEq/L (ref 96–112)
Creatinine, Ser: 1.93 mg/dL — ABNORMAL HIGH (ref 0.50–1.35)
GFR calc Af Amer: 37 mL/min — ABNORMAL LOW (ref >90)
GFR calc non Af Amer: 32 mL/min — ABNORMAL LOW (ref >90)
Glucose, Bld: 129 mg/dL — ABNORMAL HIGH (ref 70–99)
POTASSIUM: 4.6 meq/L (ref 3.7–5.3)
SODIUM: 142 meq/L (ref 137–147)
Total Protein: 6.5 g/dL (ref 6.0–8.3)

## 2014-02-02 LAB — CBC WITH DIFFERENTIAL/PLATELET
Basophils Absolute: 0 10*3/uL (ref 0.0–0.1)
Basophils Relative: 0 % (ref 0–1)
EOS ABS: 0 10*3/uL (ref 0.0–0.7)
EOS PCT: 0 % (ref 0–5)
HEMATOCRIT: 31.2 % — AB (ref 39.0–52.0)
HEMOGLOBIN: 9.9 g/dL — AB (ref 13.0–17.0)
LYMPHS PCT: 9 % — AB (ref 12–46)
Lymphs Abs: 0.3 10*3/uL — ABNORMAL LOW (ref 0.7–4.0)
MCH: 26.9 pg (ref 26.0–34.0)
MCHC: 31.7 g/dL (ref 30.0–36.0)
MCV: 84.8 fL (ref 78.0–100.0)
MONO ABS: 0 10*3/uL — AB (ref 0.1–1.0)
MONOS PCT: 0 % — AB (ref 3–12)
Neutro Abs: 2.9 10*3/uL (ref 1.7–7.7)
Neutrophils Relative %: 91 % — ABNORMAL HIGH (ref 43–77)
PLATELETS: 174 10*3/uL (ref 150–400)
RBC: 3.68 MIL/uL — AB (ref 4.22–5.81)
RDW: 14.9 % (ref 11.5–15.5)
WBC: 3.2 10*3/uL — ABNORMAL LOW (ref 4.0–10.5)

## 2014-02-02 LAB — GLUCOSE, CAPILLARY
GLUCOSE-CAPILLARY: 122 mg/dL — AB (ref 70–99)
Glucose-Capillary: 71 mg/dL (ref 70–99)

## 2014-02-02 LAB — URINE MICROSCOPIC-ADD ON

## 2014-02-02 LAB — URINALYSIS, ROUTINE W REFLEX MICROSCOPIC
GLUCOSE, UA: NEGATIVE mg/dL
KETONES UR: 40 mg/dL — AB
Nitrite: POSITIVE — AB
PH: 7.5 (ref 5.0–8.0)
Protein, ur: >300 mg/dL — AB
SPECIFIC GRAVITY, URINE: 1.013 (ref 1.005–1.030)
Urobilinogen, UA: 1 mg/dL (ref 0.0–1.0)

## 2014-02-02 LAB — MRSA PCR SCREENING: MRSA by PCR: NEGATIVE

## 2014-02-02 LAB — PROTIME-INR
INR: 2.41 — ABNORMAL HIGH (ref 0.00–1.49)
Prothrombin Time: 26.4 seconds — ABNORMAL HIGH (ref 11.6–15.2)

## 2014-02-02 LAB — I-STAT CG4 LACTIC ACID, ED: Lactic Acid, Venous: 4.59 mmol/L — ABNORMAL HIGH (ref 0.5–2.2)

## 2014-02-02 MED ORDER — AMIODARONE HCL 200 MG PO TABS
200.0000 mg | ORAL_TABLET | Freq: Every day | ORAL | Status: DC
  Administered 2014-02-02 – 2014-02-10 (×9): 200 mg via ORAL
  Filled 2014-02-02 (×9): qty 1

## 2014-02-02 MED ORDER — AMLODIPINE BESYLATE 2.5 MG PO TABS
2.5000 mg | ORAL_TABLET | Freq: Every day | ORAL | Status: DC
  Filled 2014-02-02: qty 1

## 2014-02-02 MED ORDER — SODIUM CHLORIDE 0.9 % IJ SOLN
3.0000 mL | Freq: Two times a day (BID) | INTRAMUSCULAR | Status: DC
  Administered 2014-02-02 – 2014-02-10 (×14): 3 mL via INTRAVENOUS

## 2014-02-02 MED ORDER — SODIUM CHLORIDE 0.9 % IV BOLUS (SEPSIS)
1000.0000 mL | Freq: Once | INTRAVENOUS | Status: AC
  Administered 2014-02-02: 1000 mL via INTRAVENOUS

## 2014-02-02 MED ORDER — ACETAMINOPHEN 325 MG PO TABS
650.0000 mg | ORAL_TABLET | Freq: Four times a day (QID) | ORAL | Status: DC | PRN
  Administered 2014-02-02: 650 mg via ORAL
  Filled 2014-02-02: qty 2

## 2014-02-02 MED ORDER — MORPHINE SULFATE 2 MG/ML IJ SOLN
2.0000 mg | INTRAMUSCULAR | Status: DC | PRN
  Administered 2014-02-02 – 2014-02-03 (×4): 2 mg via INTRAVENOUS
  Filled 2014-02-02 (×4): qty 1

## 2014-02-02 MED ORDER — INSULIN ASPART 100 UNIT/ML ~~LOC~~ SOLN
0.0000 [IU] | Freq: Three times a day (TID) | SUBCUTANEOUS | Status: DC
  Administered 2014-02-02: 2 [IU] via SUBCUTANEOUS

## 2014-02-02 MED ORDER — SODIUM CHLORIDE 0.9 % IV SOLN
Freq: Once | INTRAVENOUS | Status: AC
  Administered 2014-02-02: 21:00:00 via INTRAVENOUS

## 2014-02-02 MED ORDER — SIMVASTATIN 10 MG PO TABS
10.0000 mg | ORAL_TABLET | Freq: Every day | ORAL | Status: DC
Start: 2014-02-03 — End: 2014-02-10
  Administered 2014-02-03 – 2014-02-10 (×8): 10 mg via ORAL
  Filled 2014-02-02 (×9): qty 1

## 2014-02-02 MED ORDER — CEFTRIAXONE SODIUM 1 G IJ SOLR
1.0000 g | INTRAMUSCULAR | Status: DC
  Administered 2014-02-02: 1 g via INTRAVENOUS
  Filled 2014-02-02: qty 10

## 2014-02-02 MED ORDER — SODIUM CHLORIDE 0.9 % IV BOLUS (SEPSIS): 1000.0000 mL | Freq: Once | INTRAVENOUS | Status: AC

## 2014-02-02 MED ORDER — SODIUM CHLORIDE 0.9 % IV SOLN: INTRAVENOUS | Status: AC

## 2014-02-02 MED ORDER — ACETAMINOPHEN 650 MG RE SUPP: 650.0000 mg | Freq: Four times a day (QID) | RECTAL | Status: DC | PRN

## 2014-02-02 MED ORDER — SODIUM CHLORIDE 0.9 % IV SOLN
INTRAVENOUS | Status: DC
  Administered 2014-02-02: 18:00:00 via INTRAVENOUS

## 2014-02-02 MED ORDER — ONDANSETRON HCL 4 MG/2ML IJ SOLN
4.0000 mg | Freq: Four times a day (QID) | INTRAMUSCULAR | Status: DC | PRN
  Administered 2014-02-02 – 2014-02-05 (×3): 4 mg via INTRAVENOUS
  Filled 2014-02-02 (×3): qty 2

## 2014-02-02 MED ORDER — INSULIN ASPART 100 UNIT/ML ~~LOC~~ SOLN: 0.0000 [IU] | Freq: Every day | SUBCUTANEOUS | Status: DC

## 2014-02-02 MED ORDER — ONDANSETRON HCL 4 MG PO TABS
4.0000 mg | ORAL_TABLET | Freq: Four times a day (QID) | ORAL | Status: DC | PRN
  Filled 2014-02-02: qty 1
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
## 2014-02-02 NOTE — Progress Notes (Signed)
Pt given oj with sugar for cbg of 71

## 2014-02-02 NOTE — ED Notes (Signed)
Per [facility] routine foley change at 0930 without urine return. Upon assessment pt has 20 ml of bright red blood in catheter. Lockwood MD at bedside assessing pt with arrival. Per Jeraldine Loots irrigate; if unsuccessful urine return new foley placement.

## 2014-02-02 NOTE — Plan of Care (Signed)
Problem: Phase I Progression Outcomes Goal: OOB as tolerated unless otherwise ordered Outcome: Not Progressing     

## 2014-02-02 NOTE — Progress Notes (Signed)
Pt c/o increased pain around penis. Attempted to irrigate bladder no results. Called urology awaiting call back.

## 2014-02-02 NOTE — ED Notes (Signed)
Unable to collect urinalysis post foley. Urine return but gross blood clot amount 10 ml. Jeraldine Loots MD at bedside.

## 2014-02-02 NOTE — Progress Notes (Signed)
ANTIBIOTIC CONSULT NOTE - INITIAL  Pharmacy Consult for Rocephin Indication: UTI  No Known Allergies  Patient Measurements:   Adjusted Body Weight: 74 kg  Vital Signs: Temp: 99.7 F (37.6 C) (11/02 1617) Temp Source: Oral (11/02 1617) BP: 117/52 mmHg (11/02 1655) Pulse Rate: 94 (11/02 1655) Intake/Output from previous day:   Intake/Output from this shift: Total I/O In: -  Out: 10 [Urine:10]  Labs:  Recent Labs  02/02/14 1435  WBC 3.2*  HGB 9.9*  PLT 174  CREATININE 1.93*   CrCl cannot be calculated (Unknown ideal weight.). No results for input(s): VANCOTROUGH, VANCOPEAK, VANCORANDOM, GENTTROUGH, GENTPEAK, GENTRANDOM, TOBRATROUGH, TOBRAPEAK, TOBRARND, AMIKACINPEAK, AMIKACINTROU, AMIKACIN in the last 72 hours.   Microbiology: Recent Results (from the past 720 hour(s))  Urine culture     Status: None   Collection Time: 01/04/14 10:26 AM  Result Value Ref Range Status   Specimen Description URINE, CATHETERIZED  Final   Special Requests Normal  Final   Culture  Setup Time   Final    01/04/2014 16:37 Performed at [facility]   Colony Count NO GROWTH Performed at [facility]  Final   Culture NO GROWTH Performed at [facility]  Final   Report Status 01/06/2014 FINAL  Final    Medical History: Past Medical History  Diagnosis Date  . Diabetes mellitus without complication   . Hypertension   . CKD (chronic kidney disease) stage 3, GFR 30-59 ml/min 08/01/2012  . Hypoglycemia 07/26/2012  . Anemia 08/01/2012  . CVA (cerebral vascular accident)   . Pericardial effusion     750 cc drained 11/03/2013--window 11/07/13  . Peripheral arterial disease     status post left SFA stenting by myself for critical limb ischemia 02/10/10    Medications:  Scheduled:  . amiodarone  200 mg Oral Daily  . amLODipine  2.5 mg Oral Daily  . cefTRIAXone (ROCEPHIN)  IV  1 g Intravenous Q24H  . insulin aspart  0-15 Units Subcutaneous TID WC  . insulin aspart   0-5 Units Subcutaneous QHS  . [START ON 02/03/2014] simvastatin  10 mg Oral Q lunch  . sodium chloride  3 mL Intravenous Q12H   Infusions:  . sodium chloride     PRN: acetaminophen **OR** acetaminophen, morphine injection, ondansetron **OR** ondansetron (ZOFRAN) IV Assessment: 80 yo M admitted via ER from nursing home. Patient treated for UTI about 1 month ago. Patient had indwelling catheter that was changed--gross blood expressed via catheter.  Goal of Therapy:  Eradication of infection  Plan:  Rocephin 1 Gm IV q 24 hours. Follow up culture results  Loletta Specter 02/02/2014,5:14 PM

## 2014-02-02 NOTE — Plan of Care (Signed)
Problem: Consults Goal: General Medical Patient Education See Patient Education Module for specific education. Outcome: Completed/Met Date Met:  02/02/14  Problem: Phase I Progression Outcomes Goal: Pain controlled with appropriate interventions Outcome: Completed/Met Date Met:  02/02/14

## 2014-02-02 NOTE — ED Notes (Signed)
Jeraldine Loots MD at bedside. Pt foley irrigated 50 ml with sterile saline. Post intervention blood noted around meatus of genitals. Foley discontinued.
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
## 2014-02-02 NOTE — Progress Notes (Signed)
Utilization Review completed.  Ziair Penson RN CM  
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
## 2014-02-02 NOTE — ED Notes (Signed)
Bed: HF41 Expected date:  Expected time:  Means of arrival:  Comments: Elderly, foley problem

## 2014-02-02 NOTE — ED Notes (Signed)
Irrigated with 70 ml sterile saline. 40 watery blood excreted. Lockwood MD at bedside during intervention.

## 2014-02-02 NOTE — Progress Notes (Signed)
Pt with b/p of 82/34 after 1 liter bolus. Midlevel called awaiting call back. ss

## 2014-02-02 NOTE — H&P (Signed)
md on call stated to give pt pain meds and try to reirrigate it not working call md back.

## 2014-02-02 NOTE — ED Notes (Signed)
Prior to foley insertion cut noted at base of meatus. Lockwood at bedside.

## 2014-02-03 ENCOUNTER — Encounter (HOSPITAL_COMMUNITY): Payer: Self-pay | Admitting: Radiology

## 2014-02-03 ENCOUNTER — Inpatient Hospital Stay (HOSPITAL_COMMUNITY): Payer: Medicare Other

## 2014-02-03 DIAGNOSIS — R6521 Severe sepsis with septic shock: Secondary | ICD-10-CM

## 2014-02-03 DIAGNOSIS — A419 Sepsis, unspecified organism: Secondary | ICD-10-CM

## 2014-02-03 LAB — CBC
HEMATOCRIT: 20.8 % — AB (ref 39.0–52.0)
Hemoglobin: 6.8 g/dL — CL (ref 13.0–17.0)
MCH: 28.2 pg (ref 26.0–34.0)
MCHC: 32.7 g/dL (ref 30.0–36.0)
MCV: 86.3 fL (ref 78.0–100.0)
Platelets: 92 10*3/uL — ABNORMAL LOW (ref 150–400)
RBC: 2.41 MIL/uL — AB (ref 4.22–5.81)
RDW: 15.2 % (ref 11.5–15.5)
WBC: 13.2 10*3/uL — AB (ref 4.0–10.5)

## 2014-02-03 LAB — PREPARE RBC (CROSSMATCH)

## 2014-02-03 LAB — COMPREHENSIVE METABOLIC PANEL
ALT: 8 U/L (ref 0–53)
AST: 19 U/L (ref 0–37)
Albumin: 1.6 g/dL — ABNORMAL LOW (ref 3.5–5.2)
Alkaline Phosphatase: 51 U/L (ref 39–117)
Anion gap: 13 (ref 5–15)
BILIRUBIN TOTAL: 0.7 mg/dL (ref 0.3–1.2)
BUN: 49 mg/dL — ABNORMAL HIGH (ref 6–23)
CHLORIDE: 106 meq/L (ref 96–112)
CO2: 20 meq/L (ref 19–32)
CREATININE: 2.33 mg/dL — AB (ref 0.50–1.35)
Calcium: 7.4 mg/dL — ABNORMAL LOW (ref 8.4–10.5)
GFR calc Af Amer: 29 mL/min — ABNORMAL LOW (ref >90)
GFR, EST NON AFRICAN AMERICAN: 25 mL/min — AB (ref >90)
Glucose, Bld: 127 mg/dL — ABNORMAL HIGH (ref 70–99)
Potassium: 4.2 mEq/L (ref 3.7–5.3)
SODIUM: 139 meq/L (ref 137–147)
Total Protein: 4.5 g/dL — ABNORMAL LOW (ref 6.0–8.3)

## 2014-02-03 LAB — GLUCOSE, CAPILLARY
GLUCOSE-CAPILLARY: 80 mg/dL (ref 70–99)
Glucose-Capillary: 152 mg/dL — ABNORMAL HIGH (ref 70–99)
Glucose-Capillary: 141 mg/dL — ABNORMAL HIGH (ref 70–99)
Glucose-Capillary: 122 mg/dL — ABNORMAL HIGH (ref 70–99)
Glucose-Capillary: 117 mg/dL — ABNORMAL HIGH (ref 70–99)
Glucose-Capillary: 115 mg/dL — ABNORMAL HIGH (ref 70–99)

## 2014-02-03 LAB — TROPONIN I
Troponin I: <0.3 ng/mL (ref <0.30)
Troponin I: <0.3 ng/mL (ref <0.30)

## 2014-02-03 LAB — LACTIC ACID, PLASMA
LACTIC ACID, VENOUS: 2.6 mmol/L — AB (ref 0.5–2.2)
LACTIC ACID, VENOUS: 2.5 mmol/L — AB (ref 0.5–2.2)
Lactic Acid, Venous: 1.5 mmol/L (ref 0.5–2.2)
Lactic Acid, Venous: 3.3 mmol/L — ABNORMAL HIGH (ref 0.5–2.2)

## 2014-02-03 LAB — ABO/RH: ABO/RH(D): O POS

## 2014-02-03 MED ORDER — BELLADONNA ALKALOIDS-OPIUM 16.2-60 MG RE SUPP: 1.0000 | Freq: Four times a day (QID) | RECTAL | Status: DC | PRN

## 2014-02-03 MED ORDER — INSULIN ASPART 100 UNIT/ML ~~LOC~~ SOLN
2.0000 [IU] | SUBCUTANEOUS | Status: DC
  Administered 2014-02-03: 2 [IU] via SUBCUTANEOUS
  Administered 2014-02-03: 4 [IU] via SUBCUTANEOUS
  Administered 2014-02-03 – 2014-02-04 (×2): 2 [IU] via SUBCUTANEOUS

## 2014-02-03 MED ORDER — IOHEXOL 300 MG/ML  SOLN
25.0000 mL | Freq: Once | INTRAMUSCULAR | Status: AC | PRN
  Administered 2014-02-03: 25 mL

## 2014-02-03 MED ORDER — VANCOMYCIN HCL IN DEXTROSE 750-5 MG/150ML-% IV SOLN
750.0000 mg | Freq: Every day | INTRAVENOUS | Status: DC
  Administered 2014-02-03 (×2): 750 mg via INTRAVENOUS
  Filled 2014-02-03 (×2): qty 150

## 2014-02-03 MED ORDER — SODIUM CHLORIDE 0.9 % IV SOLN
Freq: Once | INTRAVENOUS | Status: AC
  Administered 2014-02-03: 03:00:00 via INTRAVENOUS

## 2014-02-03 MED ORDER — BELLADONNA ALKALOIDS-OPIUM 16.2-60 MG RE SUPP
1.0000 | Freq: Four times a day (QID) | RECTAL | Status: DC | PRN
  Administered 2014-02-03 – 2014-02-05 (×4): 1 via RECTAL
  Filled 2014-02-03 (×4): qty 1

## 2014-02-03 MED ORDER — SODIUM CHLORIDE 0.9 % IV BOLUS (SEPSIS)
1000.0000 mL | Freq: Once | INTRAVENOUS | Status: AC
  Administered 2014-02-03: 1000 mL via INTRAVENOUS

## 2014-02-03 MED ORDER — CHLORHEXIDINE GLUCONATE 0.12 % MT SOLN
15.0000 mL | Freq: Two times a day (BID) | OROMUCOSAL | Status: DC
  Administered 2014-02-03 – 2014-02-05 (×5): 15 mL via OROMUCOSAL
  Filled 2014-02-03 (×7): qty 15

## 2014-02-03 MED ORDER — FENTANYL CITRATE 0.05 MG/ML IJ SOLN
INTRAMUSCULAR | Status: AC
  Filled 2014-02-03: qty 2

## 2014-02-03 MED ORDER — NOREPINEPHRINE BITARTRATE 1 MG/ML IV SOLN
2.0000 ug/min | INTRAVENOUS | Status: DC
  Administered 2014-02-03: 10 ug/min via INTRAVENOUS
  Filled 2014-02-03: qty 4

## 2014-02-03 MED ORDER — SODIUM CHLORIDE 0.9 % IV SOLN
Freq: Once | INTRAVENOUS | Status: AC
  Administered 2014-02-03: 01:00:00 via INTRAVENOUS

## 2014-02-03 MED ORDER — PANTOPRAZOLE SODIUM 40 MG IV SOLR
40.0000 mg | Freq: Every day | INTRAVENOUS | Status: DC
  Administered 2014-02-03 (×2): 40 mg via INTRAVENOUS
  Filled 2014-02-03 (×2): qty 40

## 2014-02-03 MED ORDER — VITAMIN K1 10 MG/ML IJ SOLN
10.0000 mg | Freq: Once | INTRAMUSCULAR | Status: AC
  Administered 2014-02-03: 10 mg via SUBCUTANEOUS
  Filled 2014-02-03: qty 1

## 2014-02-03 MED ORDER — SODIUM CHLORIDE 0.9 % IV SOLN: 750.0000 mL | INTRAVENOUS | Status: DC | PRN

## 2014-02-03 MED ORDER — DOPAMINE-DEXTROSE 3.2-5 MG/ML-% IV SOLN
0.0000 ug/kg/min | INTRAVENOUS | Status: DC
  Administered 2014-02-03: 3 ug/kg/min via INTRAVENOUS
  Filled 2014-02-03: qty 250

## 2014-02-03 MED ORDER — CETYLPYRIDINIUM CHLORIDE 0.05 % MT LIQD
7.0000 mL | Freq: Two times a day (BID) | OROMUCOSAL | Status: DC
  Administered 2014-02-03 – 2014-02-05 (×3): 7 mL via OROMUCOSAL

## 2014-02-03 MED ORDER — FENTANYL CITRATE 0.05 MG/ML IJ SOLN
25.0000 ug | Freq: Once | INTRAMUSCULAR | Status: AC
  Administered 2014-02-03: 25 ug via INTRAVENOUS

## 2014-02-03 MED ORDER — FENTANYL CITRATE 0.05 MG/ML IJ SOLN: 25.0000 ug | INTRAMUSCULAR | Status: DC | PRN

## 2014-02-03 MED ORDER — PHENYLEPHRINE HCL 10 MG/ML IJ SOLN
30.0000 ug/min | INTRAVENOUS | Status: DC
  Filled 2014-02-03: qty 1

## 2014-02-03 MED ORDER — MORPHINE SULFATE 2 MG/ML IJ SOLN: 2.0000 mg | Freq: Once | INTRAMUSCULAR | Status: AC

## 2014-02-03 MED ORDER — PIPERACILLIN-TAZOBACTAM 3.375 G IVPB
3.3750 g | Freq: Three times a day (TID) | INTRAVENOUS | Status: DC
  Administered 2014-02-03 – 2014-02-07 (×13): 3.375 g via INTRAVENOUS
  Filled 2014-02-03 (×14): qty 50

## 2014-02-03 NOTE — Care Management Note (Signed)
    Page 1 of 2   02/03/2014     10:29:39 AM CARE MANAGEMENT NOTE 02/03/2014  Patient:  [name], [name]   Account Number:  [id number]  Date Initiated:  02/03/2014  Documentation initiated by:  Liticia Gasior  Subjective/Objective Assessment:   patient had urinary bleeding and anemia after a traumatic foley exchange at the alf/     Action/Plan:   Getting bld x2 units hgb 110315 6.8 after one unit   Anticipated DC Date:  02/06/2014   Anticipated DC Plan:  [facility]SIST[facility]IVING / REST HOME  In-house referral  Clinical Social Worker      DC Planning Services  CM consult      [facility] Choice  NA   Choice offered to / List presented to:  NA   DME arranged  NA      DME agency  NA     HH arranged  NA      HH agency  NA   Status of service:  In process, will continue to follow Medicare Important Message given?   (If response is "NO", the following Medicare IM given date fields will be blank) Date Medicare IM given:   Medicare IM given by:   Date Additional Medicare IM given:   Additional Medicare IM given by:    Discharge Disposition:    Per UR Regulation:  Reviewed for med. necessity/level of care/duration of stay  If discussed at Long Length of Stay Meetings, dates discussed:    Comments:  [id number]/Hoyte Ziebell Earlene Plater, RN, BSN, CCM Chart reviewed. Discharge needs and patient's stay to be reviewed and followed by case manager.

## 2014-02-03 NOTE — Progress Notes (Signed)
Pt with hgb of 6.8 midlevel called awaiting call back. Will continue to monitor. RT at bedside and starting A line. Called pt's son and given update.

## 2014-02-03 NOTE — Progress Notes (Signed)
Called by ICU secretary to patient room for unknown causes. Upon arrival, RN holding pressure from arterial catheter being out. Spoke with Dr Deterding for possible reinsertion. Telephone order received to leave arterial line out for now due to cuff pressures previously correlated with invasive readings. RN aware.

## 2014-02-03 NOTE — Progress Notes (Signed)
Urology came by to assess pt and stated although pt c/o lower abd pain and penile pain this is not attributed to urology. NP from critical care stated to start ffp in between blood PRBC. Awaiting blood bank to call to tell RN FFP thawed.

## 2014-02-03 NOTE — Plan of Care (Signed)
Problem: Phase I Progression Outcomes Goal: Voiding-avoid urinary catheter unless indicated Outcome: Not Progressing     

## 2014-02-03 NOTE — Consult Note (Signed)
WOC wound consult note Reason for Consult: Wound consult to evaluate skin beneath the Unna's Boot on the LLE and the sacral area.  Als o noted upon assessment is an unstageable PRU on the right heel. Wound type:Pressure and on the left heel, surgical Pressure Ulcer POA: Yes Measurement:left heel with exposed hardware from previous surgery. Open area measures 0.8 x 0.8cm x unknown depth.  Recommend orthopedic consultation for further guidance regarding left heel care.  Right heel with 1cm x 1.5cm purple/maroon discoloration consistent with suspected deep tissue injury.  Surrounding tissue is intact.  Area is not painful to the patient.  The sacral area presents with a 3cm x 3.5cm discolored (purple/maroon) area with no depth, some blanchable erythema in the periwound area.  Wound bed:As described above. Drainage (amount, consistency, odor) As described above.  Periwound:As described above. Dressing procedure/placement/frequency: I will provide guidance for turning and repositioning, pressure redistribution boots bilaterally for pressure ulcer prevention and saline dressings to the three affected areas.  If you agree, please consider ortho referral for guidance on the exposed hardware on the left heel. WOC nursing team will not follow, but will remain available to this patient, the nursing and medical team.  Please re-consult if needed. Thanks, Ladona Mow, MSN, RN, GNP, [place], CWON-AP [phone])

## 2014-02-03 NOTE — Progress Notes (Signed)
Patient ID: [name], male   DOB: 05-06-1933, 80 y.o.   MRN: [id number]  I reviewed the patient's CT pelvis - this shows a diverticulum at the bladder dome which is stable. I don't appreciate a bladder injury.  The catheter is positioned well within the bladder, the bladder contains no clots. I suspect the catheter irrigates irregularly because the tip of the Foley sometimes gets into the diverticulum. There is diffuse anasarca which explains the change in his physical exam.  Plan-continue Foley catheter.

## 2014-02-03 NOTE — Progress Notes (Signed)
ANTIBIOTIC CONSULT NOTE - INITIAL  Pharmacy Consult for Vancomycin and Zosyn  Indication: suspected urosepsis  No Known Allergies  Patient Measurements: Height: 5\' 6"  (167.6 cm) Weight: 162 lb 0.6 oz (73.5 kg) IBW/kg (Calculated) : 63.8 Adjusted Body Weight:   Vital Signs: Temp: 97.9 F (36.6 C) (11/03 0219) Temp Source: Oral (11/03 0219) BP: 67/33 mmHg (11/03 0100) Pulse Rate: 32 (11/03 0219) Intake/Output from previous day: 11/02 0701 - 11/03 0700 In: 3848.3 [P.O.:720; I.V.:613.3; Blood:605; IV Piggyback:50] Out: 2105 [Urine:2105] Intake/Output from this shift: Total I/O In: 2348.3 [P.O.:720; I.V.:613.3; Blood:605; Other:360; IV Piggyback:50] Out: 245 [Urine:245]  Labs:  Recent Labs  02/02/14 1435 02/03/14 0015  WBC 3.2* 13.2*  HGB 9.9* 6.8*  PLT 174 92*  CREATININE 1.93* 2.33*   Estimated Creatinine Clearance: 23.6 mL/min (by C-G formula based on Cr of 2.33). No results for input(s): VANCOTROUGH, VANCOPEAK, VANCORANDOM, GENTTROUGH, GENTPEAK, GENTRANDOM, TOBRATROUGH, TOBRAPEAK, TOBRARND, AMIKACINPEAK, AMIKACINTROU, AMIKACIN in the last 72 hours.   Microbiology: Recent Results (from the past 720 hour(s))  Urine culture     Status: None   Collection Time: 01/04/14 10:26 AM  Result Value Ref Range Status   Specimen Description URINE, CATHETERIZED  Final   Special Requests Normal  Final   Culture  Setup Time   Final    01/04/2014 16:37 Performed at [facility]   Colony Count NO GROWTH Performed at [facility]  Final   Culture NO GROWTH Performed at [facility]  Final   Report Status 01/06/2014 FINAL  Final  MRSA PCR Screening     Status: None   Collection Time: 02/02/14  5:26 PM  Result Value Ref Range Status   MRSA by PCR NEGATIVE NEGATIVE Final    Comment:        The GeneXpert MRSA Assay (FDA approved for NASAL specimens only), is one component of a comprehensive MRSA colonization surveillance program. It is not intended to  diagnose MRSA infection nor to guide or monitor treatment for MRSA infections.     Medical History: Past Medical History  Diagnosis Date  . Diabetes mellitus without complication   . Hypertension   . CKD (chronic kidney disease) stage 3, GFR 30-59 ml/min 08/01/2012  . Hypoglycemia 07/26/2012  . Anemia 08/01/2012  . CVA (cerebral vascular accident)   . Pericardial effusion     750 cc drained 11/03/2013--window 11/07/13  . Peripheral arterial disease     status post left SFA stenting by myself for critical limb ischemia 02/10/10    Medications:  Anti-infectives    Start     Dose/Rate Route Frequency Ordered Stop   02/03/14 0230  piperacillin-tazobactam (ZOSYN) IVPB 3.375 g     3.375 g12.5 mL/hr over 240 Minutes Intravenous 3 times per day 02/03/14 0218     02/03/14 0230  vancomycin (VANCOCIN) IVPB 750 mg/150 ml premix     750 mg150 mL/hr over 60 Minutes Intravenous Daily at bedtime 02/03/14 0218     02/02/14 1800  cefTRIAXone (ROCEPHIN) 1 g in dextrose 5 % 50 mL IVPB  Status:  Discontinued     1 g100 mL/hr over 30 Minutes Intravenous Every 24 hours 02/02/14 1709 02/03/14 0154     Assessment: Patient with suspected urosepsis.  Vancomycin and Zosyn ordered in place of rocephin.  Patient with poor renal function.  Goal of Therapy:  Vancomycin trough level 15-20 mcg/ml  Zosyn based on renal function   Plan:  Measure antibiotic drug levels at steady state Follow up culture  results Vancomycin 750mg  iv q24hr  Zosyn 3.375g IV Q8H infused over 4hrs.   [name], Barry ShoneJulian White 02/03/2014,2:30 AM

## 2014-02-03 NOTE — Plan of Care (Signed)
Problem: Phase I Progression Outcomes Goal: OOB as tolerated unless otherwise ordered Outcome: Progressing     

## 2014-02-03 NOTE — Progress Notes (Signed)
Upon entering room. Pt with aline out. Small amount of blood noted on bed. Pressure applied x 15 minutes. RT called. Pt w/o any s/s of distress. Will continue to monitor.

## 2014-02-03 NOTE — Consult Note (Signed)
PULMONARY / CRITICAL CARE MEDICINE   Name: [name] MRN: [id number] DOB: Nov 18, 1933    ADMISSION DATE:  02/02/2014 CONSULTATION DATE:  02/03/2014  REFERRING MD :  Dr. Rhona Leavens  CHIEF COMPLAINT:  Hematuria  INITIAL PRESENTATION: 80 year old male who presented to [facility] ED 11/2 after traumatic foley change at assisted living facility. He had initially improved, and then 11/3 early AM he became hypotensive and was found to be anemic. He required pressors and PCCM was consulted for further eval.   STUDIES:  11/2 CT abd/pelvis > Inflammatory process at dome of bladder with gas. Malpositioned foley catheter, L inguinal hernia.   SIGNIFICANT EVENTS:   HISTORY OF PRESENT ILLNESS:  80 year old male with PMH as below, which includes Dementia, DM, CKD 3, CVA, and pericardial effusion presented to [facility] ED 11/2. He resides at [facility] with chronic indwelling foley. Hematuria was noted 11/2 and foley was changed. It was a traumatic insertion with bleeding. He presented to [facility] for this. He was admitted and treated for UTI and given IVF resuscitation. He was doing well initially, but slowly became hypotensive. He was also found to be anemic. He was started on dopamine and transfused 2 units PRBC. PCCM was asked to see for further eval.  CT was performed that revealed air in urinary bladder.  Urology was consulted.  PAST MEDICAL HISTORY :   has a past medical history of Diabetes mellitus without complication; Hypertension; CKD (chronic kidney disease) stage 3, GFR 30-59 ml/min (08/01/2012); Hypoglycemia (07/26/2012); Anemia (08/01/2012); CVA (cerebral vascular accident); Pericardial effusion; and Peripheral arterial disease.  has past surgical history that includes Leg Surgery; Foot surgery; and Subxyphoid pericardial window (N/A, 11/07/2013). Prior to Admission medications   Medication Sig Start Date End Date Taking? Authorizing Provider  amiodarone (PACERONE) 200 MG tablet Take 200 mg by mouth daily.    Historical  Provider, MD  amLODipine (NORVASC) 2.5 MG tablet Take 2.5 mg by mouth daily.    Historical Provider, MD  aspirin 81 MG chewable tablet Chew 81 mg by mouth daily.    Historical Provider, MD  ferrous sulfate 325 (65 FE) MG tablet Take 325 mg by mouth 3 (three) times daily with meals.    Historical Provider, MD  furosemide (LASIX) 20 MG tablet Take 20 mg by mouth.    Historical Provider, MD  Lactobacillus (PROBIOTIC ACIDOPHILUS PO) Take 1 capsule by mouth 2 (two) times daily.    Historical Provider, MD  oxyCODONE (OXY IR/ROXICODONE) 5 MG immediate release tablet Take one tablet by mouth every four hours as needed for pain; Take two tablets by mouth every four hours as needed for pain 12/03/13   Oneal Grout, MD  potassium chloride SA (K-DUR,KLOR-CON) 20 MEQ tablet Take 20 mEq by mouth daily.    Historical Provider, MD  sennosides-docusate sodium (SENOKOT-S) 8.6-50 MG tablet Take 2 tablets by mouth 2 (two) times daily.    Historical Provider, MD  simvastatin (ZOCOR) 10 MG tablet Take 10 mg by mouth daily with lunch.    Historical Provider, MD  warfarin (COUMADIN) 1 MG tablet Take 1.5 mg by mouth daily. Take with 0.5 mg for a total of 1.5 mg daily    Historical Provider, MD   No Known Allergies  FAMILY HISTORY:  has no family status information on file.  SOCIAL HISTORY:  reports that he has never smoked. He has never used smokeless tobacco. He reports that he does not drink alcohol or use illicit drugs.  REVIEW OF SYSTEMS:  Unable as patient is encephalopathic.  SUBJECTIVE: Lower abdominal pain.  VITAL SIGNS: Temp:  [97.8 F (36.6 C)-102 F (38.9 C)] 98.4 F (36.9 C) (11/03 0154) Pulse Rate:  [50-116] 51 (11/03 0154) Resp:  [11-26] 16 (11/03 0154) BP: (59-119)/(28-80) 67/33 mmHg (11/03 0100) SpO2:  [92 %-100 %] 99 % (11/03 0154) Arterial Line BP: (59)/(16) 59/16 mmHg (11/03 0154) Weight:  [73.5 kg (162 lb 0.6 oz)] 73.5 kg (162 lb 0.6 oz) (11/02 1726) HEMODYNAMICS:   VENTILATOR  SETTINGS:   INTAKE / OUTPUT:  Intake/Output Summary (Last 24 hours) at 02/03/14 0156 Last data filed at 02/03/14 0000  Gross per 24 hour  Intake 3243.34 ml  Output   2105 ml  Net 1138.34 ml    PHYSICAL EXAMINATION: General:  Elderly male in NAD Neuro:  Alert to verbal, disoriented. Reportedly pretty diminished at baseline.  HEENT:  [place]/AT, no JVD noted. PERRL Cardiovascular:  [place] FoleyBrady, no MRG Lungs:  Clear bilateral lung sounds. Diminished bases.  Abdomen:  Soft, non-tender, non-distended Musculoskeletal:  Bandage to LLE, broken bone per patient although this seems unlikely given dressing.   Skin:  Intact  LABS:  CBC  Recent Labs Lab 02/02/14 1435 02/03/14 0015  WBC 3.2* 13.2*  HGB 9.9* 6.8*  HCT 31.2* 20.8*  PLT 174 92*   Coag's  Recent Labs Lab 02/02/14 1435  INR 2.41*   BMET  Recent Labs Lab 02/02/14 1435 02/03/14 0015  NA 142 139  K 4.6 4.2  CL 104 106  CO2 22 20  BUN 45* 49*  CREATININE 1.93* 2.33*  GLUCOSE 129* 127*   Electrolytes  Recent Labs Lab 02/02/14 1435 02/03/14 0015  CALCIUM 8.9 7.4*   Sepsis Markers  Recent Labs Lab 02/02/14 1444 02/03/14 0013  LATICACIDVEN 4.59* 3.3*   ABG No results for input(s): PHART, PCO2ART, PO2ART in the last 168 hours. Liver Enzymes  Recent Labs Lab 02/02/14 1435 02/03/14 0015  AST 17 19  ALT 10 8  ALKPHOS 114 51  BILITOT 1.2 0.7  ALBUMIN 2.3* 1.6*   Cardiac Enzymes No results for input(s): TROPONINI, PROBNP in the last 168 hours. Glucose  Recent Labs Lab 02/02/14 1737 02/02/14 2140  GLUCAP 122* 71    Imaging Ct Abdomen Pelvis Wo Contrast  02/02/2014   CLINICAL DATA:  80 year old male with acute gross hematuria from Foley catheter. Initial encounter.  EXAM: CT ABDOMEN AND PELVIS WITHOUT CONTRAST  TECHNIQUE: Multidetector CT imaging of the abdomen and pelvis was performed following the standard protocol without IV contrast.  COMPARISON:  Chest CT 10/02/2013.  FINDINGS: Regressed  pericardial effusion but increased layering bilateral pleural effusions, larger on the left. Associated compressive lung base atelectasis.  No acute osseous abnormality identified. Calcified right flank injection granulomas.  Foley catheter in place but the Foley balloon is inflated just below the pelvic floor (posterior urethra see sagittal image 60).  Hematocrit level within the bladder (series 2, image 70). Along the dome of the bladder there is an abnormal air and fluid collection encompassing 29 x 57 x 40 mm (AP by transverse by CC) with surrounding fat stranding. This finding is also adjacent 2 but appears separate from the sigmoid colon. There are sigmoid diverticula. No active inflammation of the segment of the sigmoid colon is identified.  However, there is a mesentery and proximal sigmoid/ distal descending colon containing left inguinal hernia which continues into the left hemiscrotum and is incompletely visible with associated free fluid (series 2, image 98).  Upstream of the hernia there  is no dilatation of the left colon. Diverticular again noted. Decompressed transverse colon and much of the right colon. Diverticulosis of the cecum and ascending colon. Normal appendix which courses toward the midline. Inflammation along the lower tip of the cecum probably secondary to the bladder findings described earlier. No dilated small bowel. Decompressed stomach and duodenum.  Cholelithiasis. No definite pericholecystic inflammation. Negative non contrast liver, spleen, pancreas and adrenal glands. No hydronephrosis. There is mild right hydroureter and periureteral stranding to the level of the pelvic inlet where there is inflammation surrounding the abnormal dome of the bladder described earlier. Distal to that level the distal right ureter is decompressed.  Aortoiliac calcified atherosclerosis noted. Ectasia of the abdominal aorta and proximal iliac arteries. No pelvic sidewall lymphadenopathy identified. No  abdominal lymphadenopathy.  IMPRESSION: 1. Inflammatory process at the dome of the bladder where there is an extraluminal appearing 5-6 cm air in fluid collection with surrounding stranding. Favor infected and inflamed fundal diverticulum. The gas within could be from gas-forming infection, or perhaps recent bladder catheter manipulation. No adjacent bowel loops are inflamed such that fistula is not favored. 2. Malpositioned Foley catheter, balloon inflated at the level of the pelvic floor. 3. Large incompletely visible bowel containing left inguinal hernia, also with some free fluid. 4. Chronic and increased layering pleural effusions. Chronic decreased pericardial effusion. Study discussed by telephone with admitting Hospitalist Dr. Aldine Contes on 02/02/2014 at the time of dictation.   Electronically Signed   By: Augusto Gamble M.D.   On: 02/02/2014 17:05   Dg Chest Port 1 View  02/02/2014   CLINICAL DATA:  80 year old male with acute lactic acidosis. Initial encounter.  EXAM: PORTABLE CHEST - 1 VIEW  COMPARISON:  12/30/2013 and earlier.  FINDINGS: Portable AP semi upright view at 1559 hrs. Mildly lower lung volumes. Stable cardiac size and mediastinal contours. Visualized tracheal air column is within normal limits. No pneumothorax. Increased interstitial markings diffusely. Patchy increased opacity along the right hilum and minor fissure. Trace pleural fluid in the right minor fissure. No air bronchograms. Stable visualized osseous structures.  IMPRESSION: Lower lung volumes with diffuse increased interstitial opacity, more confluent at the right hilum. Interstitial edema with pleural fluid is favored. Bilateral viral/atypical pneumonia less likely.   Electronically Signed   By: Augusto Gamble M.D.   On: 02/02/2014 16:21     ASSESSMENT / PLAN:  PULMONARY A: No acute issues  P:   Monitor airway closely for potential need for intubation. Titrate O2 for sats. IS and flutter valve.  CARDIOVASCULAR CVL >>> A:   Shock, septic +/- hemorrhagic Bradycardia Chronic pericardial effusion (smaller 11/2 than prior study) Atrail fibrillation on coumadin and amio.   P:  MAP goal > 45mm/Hg. Aggressive volume resuscitation with IVF and blood products as indicated. Dopamine to maintain MAP goal and Pulse > 60 Add phenylephrine Trend lactic acid Trend troponin Place CVL and follow CVP  RENAL A:   Acute on CKD with oliguria Chronic foley Hematuria, traumatic foley insertion.  P:   IVF hydration Follow [facility] Urology evaluated patient, will come and see again today for potential need for continuous bladder irrigation.  GASTROINTESTINAL A:   Large inguinal hernia Cholelithiasis, no inflammation  P:   SUP: Pantoprazole NPO for now  HEMATOLOGIC A:  Acute blood loss anemia Coumadin coagulopathy  P:  Transfusing 2 units PRBC now Transfuse FFP Vitamin K Follow CBC, Coags Urology following.  Hold coumadin.  Hold amio for bradycardia.  INFECTIOUS A:  Severe sepsis, urine likely source  P:   BCx2 11/2 >>> UC 11/2 >>> Abx: Ceftriaxone, start date 11/2 > 11/2 Abx: Vancomycin, start date 11/3, day 1 Abx: Zosyn, start date 11/3, day 1 Trend WBC and fever curve  ENDOCRINE A:   DM  P:   CBG monitoring with SSI  NEUROLOGIC A:  Acute encephalopathy, unclear how far he is from baseline  H/o several CVA with residual confusion.  P:   RASS goal: 0 Monitor PRN fentanyl for analgesia.   FAMILY  - Updates: No family bedside.  - Inter-disciplinary family meet or Palliative Care meeting due by:  11/10  Joneen Roach, ACNP [place] [facility] Pager [phone] or [phone]  TODAY'S SUMMARY: UTI with septic shock, will place TLC and change pressor to central line, urology called back for ?continuous bladder irrigation, follow CVP and continue vanc/zosyn and will follow up.  CC time 45 min.  Patient seen and examined, agree with above note.  I dictated the care  and orders written for this patient under my direction.  Alyson Reedy, M.D. [facility] Pulmonary/Critical Care Medicine. Pager: [phone]. After hours pager: [phone].

## 2014-02-03 NOTE — Progress Notes (Signed)
Consent obtained for blood products, arterial line and central line from son. He is primary contact per demographics.

## 2014-02-03 NOTE — Progress Notes (Addendum)
Pt admitted earlier 11/2 with hematuria and traumatic injury from Foley insertion and sepsis of unknown source. LA over 4. Slightly hypotensive in ED and improved.  Now, hypotensive in the 70-80 range and in process of receiving his 4th NS bolus since admission. BP dropped to high 60s at one point, and this NP called Elink and spoke to Dr. Darrick Penna about plan of care. She agreed to continue boluses for now, place Aline, and follow. Dr. Darrick Penna saw pt via in room camera.  Will get RT to place Aline for accurate BP measurement and follow. May need a line and pressors eventually.  Stat CBC and BMP. Type and screen. Craige Cotta, NP NP to bedside. Pt awakens with name calling but is sleepy. VS reviewed. RT to place Aline. 4th bolus ending, 5th one starting. BP in 80s. Respiratory status-normal rate, no increased WOB, O2 sat normal.  UA positive on admission and pt on Rocephin, this is likely cause of sepsis. Cultures pending.  Pt not taking much po, continue IVF. D/c SSI for now and will restart if sugars rise. Change CBG to q4.  Will follow closely.  Update: Hgb low at 6.8. TF 2 units with H/H after. BMP-creat trending up. Fluids should help. WBCC climbing as well.  Update: Urology MD at bedside checking catheter. Clear drainage and irrigates well. BP in high 60s to 70s on Aline. Elink aware and have ordered pressors. Blood is pending for transfusion. PCCM NP at bedside now. NP to call son and relate situation and inquire about treatment plan/code status.  Jimmye Norman, NP [facility] Update: Spoke to son who is POA. Explained gravity of situation, sepsis, organ failure, pressors, blood transfusion, etc. Explained how "sick" his father is and that no matter what we do, he could still die. Son is reasonable. Son presently wants aggressive care and confirms father is a FULL CODE. Seems open to address goals of care if measures fail.  Baseline at SNF is memory issues (dementia vs vascular from  multiple strokes). Last stroke in Aug and was at [facility] for rehab but hasn't progressed. Couldn't return home because spouse is chronically ill as well. Doesn't ambulate on own and can't feed himself. Son says his baseline has really decreased over past few months to one year.  Will continue to update son. KJKG, NP

## 2014-02-03 NOTE — Procedures (Signed)
Central Venous Catheter Insertion Procedure Note [name] [id number] [birth year]  Procedure: Insertion of Central Venous Catheter Indications: Assessment of intravascular volume and Drug and/or fluid administration  Procedure Details Consent: Risks of procedure as well as the alternatives and risks of each were explained to the (patient/caregiver).  Consent for procedure obtained. Time Out: Verified patient identification, verified procedure, site/side was marked, verified correct patient position, special equipment/implants available, medications/allergies/relevent history reviewed, required imaging and test results available.  Performed  Maximum sterile technique was used including antiseptics, cap, gloves, gown, hand hygiene, mask and sheet. Skin prep: Chlorhexidine; local anesthetic administered A antimicrobial bonded/coated triple lumen catheter was placed in the right internal jugular vein using the Seldinger technique.  Evaluation Blood flow good Complications: No apparent complications Patient did tolerate procedure well. Chest X-ray ordered to verify placement.  CXR: pending.  U/S used in placement.  YACOUB,WESAM 02/03/2014, 10:22 AM

## 2014-02-03 NOTE — Progress Notes (Signed)
Removed ace wrap/una wrap from left lower leg per [facility] nurse instructions.  Cleansed leg with soap and water.  WOC nurse to come and assess the patient's wounds.

## 2014-02-03 NOTE — Progress Notes (Addendum)
Patient ID: [name], male   DOB: [birth year], 80 y.o.   MRN: [id number]  Called again about foley catheter and patients exam.   PE: Pt is NAD, looks much better than yesterday evening and last night. Now alert.  Abd - has developed SP and scrotal edema which is new from 1 AM, but it is soft and non-tender. Also bladder non-distended and non-tender. Abd feels soft, non-tender. GU - clear urine in tubing; foley irrigates normally without hematuria and clear return. Pt has pain with irrigation this AM    Intake/Output Summary (Last 24 hours) at 02/03/14 1034 Last data filed at 02/03/14 0910  Gross per 24 hour  Intake 11035.81 ml  Output   2255 ml  Net 8780.81 ml   Imp -  Urinary retetion, urethral trauma - s/p foley catheter SP edema - likely from the 11L of fluid pt has received Bladder pain - likely from foley Low UOP - likely from hypotension.   However, given the low UOP, bladder pain, etc. I ordered a CT cystogram to ensure foley in place, draining well, bladder without perf (given improper foley's obstructed bladder yesterday), etc.

## 2014-02-03 NOTE — Procedures (Signed)
Arterial Catheter Insertion Procedure Note [name] [id number] July 12, 1933  Procedure: Insertion of Arterial Catheter  Indications: Blood pressure monitoring  Procedure Details Consent: Risks of procedure as well as the alternatives and risks of each were explained to the (patient/caregiver).  Consent for procedure obtained. Time Out: Verified patient identification, verified procedure, site/side was marked, verified correct patient position, special equipment/implants available, medications/allergies/relevent history reviewed, required imaging and test results available.  Performed  Maximum sterile technique was used including antiseptics, cap, gloves, gown, hand hygiene, mask and sheet. Skin prep: Chlorhexidine; local anesthetic administered 20 gauge catheter was inserted into left radial artery using the Seldinger technique.  Evaluation Blood flow good; BP tracing good. Complications: No apparent complications.   [name] 02/03/2014

## 2014-02-03 NOTE — Treatment Plan (Signed)
Overnight events noted. Pt continued to be hypotensive, ultimately requiring IV pressors. Pt not seen by me today, but is now under care of PCCM as pt is on pressor support. Discussed with PCCM earlier this AM.

## 2014-02-03 NOTE — Progress Notes (Signed)
Paged urology to give update on pt status. Awaiting call back.

## 2014-02-03 NOTE — Progress Notes (Signed)
Patient ID: [name], male   DOB: 1933/07/23, 80 y.o.   MRN: [id number]  I was called by nurse as she wasn't sure foley was draining and though bladder might be distended. She has been able to irrigate catheter and a bladder scan read only 40 ml.   PE: Bladder not distended on palpation - nurse pointed out area of concern and it is a left inguinal hernia which again in non-tender on my exam. GU: foley in place - I dont appreciate any further bleeding around foley and if there is any it is significantly improved compared to my exam with prior foley in place and just after the change The foley catheter irrigates normally and there is no hematuria or clots.   Imp, plan  - gross hematuria - much improved Low UOP - likely from hypotension, acute on CRF - recommend continued resuscitation

## 2014-02-04 ENCOUNTER — Inpatient Hospital Stay (HOSPITAL_COMMUNITY): Payer: Medicare Other

## 2014-02-04 DIAGNOSIS — R6521 Severe sepsis with septic shock: Secondary | ICD-10-CM

## 2014-02-04 DIAGNOSIS — R319 Hematuria, unspecified: Secondary | ICD-10-CM

## 2014-02-04 DIAGNOSIS — A419 Sepsis, unspecified organism: Secondary | ICD-10-CM

## 2014-02-04 DIAGNOSIS — E872 Acidosis: Secondary | ICD-10-CM

## 2014-02-04 LAB — TYPE AND SCREEN
ABO/RH(D): O POS
ANTIBODY SCREEN: NEGATIVE
UNIT DIVISION: 0
Unit division: 0

## 2014-02-04 LAB — CBC
HEMATOCRIT: 25.8 % — AB (ref 39.0–52.0)
Hemoglobin: 8.4 g/dL — ABNORMAL LOW (ref 13.0–17.0)
MCH: 28.1 pg (ref 26.0–34.0)
MCHC: 32.6 g/dL (ref 30.0–36.0)
MCV: 86.3 fL (ref 78.0–100.0)
Platelets: 109 10*3/uL — ABNORMAL LOW (ref 150–400)
RBC: 2.99 MIL/uL — AB (ref 4.22–5.81)
RDW: 15.8 % — ABNORMAL HIGH (ref 11.5–15.5)
WBC: 16.3 10*3/uL — ABNORMAL HIGH (ref 4.0–10.5)

## 2014-02-04 LAB — GLUCOSE, CAPILLARY
GLUCOSE-CAPILLARY: 106 mg/dL — AB (ref 70–99)
Glucose-Capillary: 83 mg/dL (ref 70–99)
Glucose-Capillary: 89 mg/dL (ref 70–99)
Glucose-Capillary: 107 mg/dL — ABNORMAL HIGH (ref 70–99)

## 2014-02-04 LAB — BASIC METABOLIC PANEL
Anion gap: 14 (ref 5–15)
BUN: 56 mg/dL — AB (ref 6–23)
CO2: 20 meq/L (ref 19–32)
CREATININE: 2.39 mg/dL — AB (ref 0.50–1.35)
Calcium: 7.5 mg/dL — ABNORMAL LOW (ref 8.4–10.5)
Chloride: 108 mEq/L (ref 96–112)
GFR calc Af Amer: 28 mL/min — ABNORMAL LOW (ref >90)
GFR calc non Af Amer: 24 mL/min — ABNORMAL LOW (ref >90)
Glucose, Bld: 89 mg/dL (ref 70–99)
Potassium: 4.4 mEq/L (ref 3.7–5.3)
Sodium: 142 mEq/L (ref 137–147)

## 2014-02-04 LAB — PREPARE FRESH FROZEN PLASMA: UNIT DIVISION: 0

## 2014-02-04 LAB — MAGNESIUM: Magnesium: 1.7 mg/dL (ref 1.5–2.5)

## 2014-02-04 LAB — PHOSPHORUS: Phosphorus: 3.4 mg/dL (ref 2.3–4.6)

## 2014-02-04 MED ORDER — PANTOPRAZOLE SODIUM 40 MG PO TBEC
40.0000 mg | DELAYED_RELEASE_TABLET | Freq: Every day | ORAL | Status: DC
  Administered 2014-02-04 – 2014-02-10 (×7): 40 mg via ORAL
  Filled 2014-02-04 (×7): qty 1

## 2014-02-04 MED ORDER — DOCUSATE SODIUM 100 MG PO CAPS
100.0000 mg | ORAL_CAPSULE | Freq: Two times a day (BID) | ORAL | Status: DC
  Administered 2014-02-04 – 2014-02-10 (×12): 100 mg via ORAL
  Filled 2014-02-04 (×13): qty 1

## 2014-02-04 NOTE — Plan of Care (Signed)
Problem: Phase I Progression Outcomes Goal: OOB as tolerated unless otherwise ordered Outcome: Progressing     

## 2014-02-04 NOTE — Progress Notes (Signed)
PULMONARY / CRITICAL CARE MEDICINE   Name: [name] MRN: [id number] DOB: 03-Dec-1933    ADMISSION DATE:  02/02/2014 CONSULTATION DATE:  02/03/2014  REFERRING MD :  Dr. Rhona Leavens  CHIEF COMPLAINT:  Hematuria  INITIAL PRESENTATION:  80 year old male who presented to [facility] ED 11/2 after traumatic foley change at assisted living facility. He had initially improved, and then 11/3 early AM he became hypotensive and was found to be anemic. He required pressors and PCCM was consulted for further eval.   STUDIES:  11/2 CT abd/pelvis > Inflammatory process at dome of bladder with gas. Malpositioned foley catheter, L inguinal hernia.   SIGNIFICANT EVENTS: 11/3: moved to ICU for for shock 11/4 OFF pressors.    SUBJECTIVE: Lower abdominal pain.  VITAL SIGNS: Temp:  [97.6 F (36.4 C)-98.9 F (37.2 C)] 97.6 F (36.4 C) (11/04 0400) Pulse Rate:  [55-96] 62 (11/04 0800) Resp:  [13-29] 14 (11/04 0800) BP: (80-148)/(38-87) 116/43 mmHg (11/04 0800) SpO2:  [93 %-100 %] 100 % (11/04 0800) Weight:  [81.7 kg (180 lb 1.9 oz)] 81.7 kg (180 lb 1.9 oz) (11/04 0400)  Room air  HEMODYNAMICS: CVP:  [9 mmHg-17 mmHg] 11 mmHg VENTILATOR SETTINGS:   INTAKE / OUTPUT:  Intake/Output Summary (Last 24 hours) at 02/04/14 4098 Last data filed at 02/04/14 0800  Gross per 24 hour  Intake 1501.2 ml  Output    700 ml  Net  801.2 ml    PHYSICAL EXAMINATION: General:  Elderly male in NAD Neuro:  Alert to verbal, disoriented. Reportedly pretty diminished at baseline.  HEENT:  [place]/AT, no JVD noted. PERRL Cardiovascular:  A fib no MRG Lungs:  Clear bilateral lung sounds. Diminished bases.  Abdomen:  Soft, non-tender, non-distended Musculoskeletal:  Bandage to LLE, broken bone per patient although this seems unlikely given dressing.   Skin:  Intact  LABS:  CBC  Recent Labs Lab 02/02/14 1435 02/03/14 0015 02/04/14 0440  WBC 3.2* 13.2* 16.3*  HGB 9.9* 6.8* 8.4*  HCT 31.2* 20.8* 25.8*  PLT 174 92* 109*    Coag's  Recent Labs Lab 02/02/14 1435  INR 2.41*   BMET  Recent Labs Lab 02/02/14 1435 02/03/14 0015 02/04/14 0440  NA 142 139 142  K 4.6 4.2 4.4  CL 104 106 108  CO2 22 20 20   BUN 45* 49* 56*  CREATININE 1.93* 2.33* 2.39*  GLUCOSE 129* 127* 89   Electrolytes  Recent Labs Lab 02/02/14 1435 02/03/14 0015 02/04/14 0440  CALCIUM 8.9 7.4* 7.5*  MG  --   --  1.7  PHOS  --   --  3.4   Sepsis Markers  Recent Labs Lab 02/03/14 0330 02/03/14 1055 02/03/14 1432  LATICACIDVEN 2.6* 2.5* 1.5   ABG No results for input(s): PHART, PCO2ART, PO2ART in the last 168 hours. Liver Enzymes  Recent Labs Lab 02/02/14 1435 02/03/14 0015  AST 17 19  ALT 10 8  ALKPHOS 114 51  BILITOT 1.2 0.7  ALBUMIN 2.3* 1.6*   Cardiac Enzymes  Recent Labs Lab 02/03/14 0330 02/03/14 1055 02/03/14 1530  TROPONINI <0.30 <0.30 <0.30   Glucose  Recent Labs Lab 02/03/14 0837 02/03/14 1208 02/03/14 1619 02/03/14 2009 02/03/14 2300 02/04/14 0344  GLUCAP 141* 122* 80 117* 115* 83    Imaging Ct Pelvis Wo Contrast  02/03/2014   CLINICAL DATA:  Bladder pain. Low urine output. Scrotal edema which is new since earlier this morning. Urinary retention. Urethral trauma, status post Foley catheter.Bladder pain R39.89 (ICD-10-CM) Low urine  output R34 (ICD-10-CM)  EXAM: CT PELVIS WITHOUT CONTRAST  TECHNIQUE: Multidetector CT imaging of the pelvis was performed following the standard protocol without intravenous contrast.  COMPARISON:  CT of 1 day prior  FINDINGS: Nearly 7 cm stool ball within the rectum. Mild motion degradation throughout. Incompletely imaged left inguinal hernia containing and nonobstructive sigmoid colon. Extensive colonic diverticulosis. Normal caliber of small bowel loops. No pneumatosis.  No pelvic adenopathy. A right inguinal hernia contains fat and minimal fluid. Small volume paracolic gutter fluid or interstitial thickening is slightly increased, specially on the left.  Example image 1.  Urinary bladder demonstrates an irregular mildly thickened wall. Focal gas and contrast filled outpouching at the bladder dome measures 3.7 cm on image 23. Decreased since the prior exam. No surrounding inflammation. The tip of the Foley catheter is positioned within this outpouching  The bladder is contrast filled, but not well distended. The Foley balloon is now appropriately positioned. Normal prostate.  Diffuse anasarca.  No acute osseous abnormality.  IMPRESSION: 1. Mild motion degradation. 2. Bladder wall thickening and irregularity, likely related to bladder outlet obstruction. Focal outpouching of the bladder dome contains contrast and air. Although this could represent a diverticulum, given the clinical history, localized bladder injury cannot be excluded. Of note, the Foley catheter terminates in this outpouching 3. Foley catheter in place with incomplete bladder decompression. Unless the catheter has been clamped, this would suggest suboptimal catheter function. 4. Left larger than right inguinal hernias. Left-sided hernia contains nonobstructive transverse colon. 5. Increase in anasarca and incompletely imaged fluid or interstitial thickening along the paracolic gutters. 6. Large stool ball in the rectum, suggesting constipation or fecal impaction.   Electronically Signed   By: Jeronimo GreavesKyle  Talbot M.D.   On: 02/03/2014 16:06   Dg Chest Port 1 View  02/03/2014   CLINICAL DATA:  Shock, status post central line placement  EXAM: PORTABLE CHEST - 1 VIEW  COMPARISON:  02/02/2014  FINDINGS: The cardiac shadow is stable. Patchy interstitial changes are again noted bilaterally. Poor inspiratory effort is again noted. A right central venous line is seen with the catheter tip in the distal superior vena cava. No pneumothorax is noted. No other focal abnormality is seen.  IMPRESSION: No pneumothorax following central line placement.  The chest is stable from the previous day.   Electronically Signed    By: Alcide CleverMark  Lukens M.D.   On: 02/03/2014 11:38     ASSESSMENT / PLAN:  PULMONARY A: Bilateral lung opacities with L>R effusions: favor element of edema.   P:   Consider restarting home lasix 20mg  if remains off pressors for next 24 hrs  Titrate O2 for sats. IS and flutter valve. Up to chair today  CARDIOVASCULAR CVL >>> A:  Shock, septic +/- hemorrhagic- Resolved Chronic pericardial effusion (smaller 11/2 than prior study) Atrail fibrillation on coumadin and amio.  CVP 11 P:  Fluid KVO'd as taking good PO Home amiodarone restart home zocor 10mg     RENAL A:   Acute on CKD with oliguria improving..hope that scr leveling off  Chronic foley Hematuria resolved, traumatic foley insertion. blaP:   IVF hydration Follow [facility] following, appreciate input PRN B&O supp   GASTROINTESTINAL A:   Large inguinal hernia Cholelithiasis, no inflammation  P:   SUP: Pantoprazole Advance diet  HEMATOLOGIC A:  Acute blood loss anemia Coumadin coagulopathy leukocytosis P:  Follow CBC, [facility] following.  Hold coumadin.   INFECTIOUS A:   Severe sepsis, urine likely source-->NOS to  Date   P:   BCx2 11/2 >>> UC 11/2 >>> Abx: Ceftriaxone, start date 11/2 > 11/2 Abx: Vancomycin, start date 11/3, day 2-->DC, will cont: Zosyn, start date 11/3, day 2 Trend WBC and fever curve  ENDOCRINE A:   DM  P:   CBG monitoring with SSI  NEUROLOGIC A:   Acute encephalopathy, unclear how far he is from baseline  H/o several CVA with residual confusion.  P:   RASS goal: 0 Monitor PRN fentanyl for analgesia.    MUSCULAR SKELETAL  A: LLE left heel wound w/ exposed hardware Sacral ulcer P: Wound nurse following Have placed call w/ Ortho to give wound care instructions  FAMILY  - Updates: No family bedside.  - Inter-disciplinary family meet or Palliative Care meeting due by:  11/10   ATTENDING NOTE: Patient improved, off pressors at this point, ready to  transition to SDU, TRH to pick up for 11/5, PCCM will sign off.  Patient seen and examined, agree with above note.  I dictated the care and orders written for this patient under my direction.  Alyson Reedy, MD [phone]

## 2014-02-04 NOTE — Progress Notes (Deleted)
PULMONARY / CRITICAL CARE MEDICINE   Name: [name] MRN: [id number] DOB: Nov 18, 1933    ADMISSION DATE:  02/02/2014 CONSULTATION DATE:  02/03/2014  REFERRING MD :  Dr. Rhona Leavens  CHIEF COMPLAINT:  Hematuria  INITIAL PRESENTATION: 80 year old male who presented to [facility] ED 11/2 after traumatic foley change at assisted living facility. He had initially improved, and then 11/3 early AM he became hypotensive and was found to be anemic. He required pressors and PCCM was consulted for further eval.   STUDIES:  11/2 CT abd/pelvis > Inflammatory process at dome of bladder with gas. Malpositioned foley catheter, L inguinal hernia.   SIGNIFICANT EVENTS:   HISTORY OF PRESENT ILLNESS:  80 year old male with PMH as below, which includes Dementia, DM, CKD 3, CVA, and pericardial effusion presented to [facility] ED 11/2. He resides at [facility] with chronic indwelling foley. Hematuria was noted 11/2 and foley was changed. It was a traumatic insertion with bleeding. He presented to [facility] for this. He was admitted and treated for UTI and given IVF resuscitation. He was doing well initially, but slowly became hypotensive. He was also found to be anemic. He was started on dopamine and transfused 2 units PRBC. PCCM was asked to see for further eval.  CT was performed that revealed air in urinary bladder.  Urology was consulted.  PAST MEDICAL HISTORY :   has a past medical history of Diabetes mellitus without complication; Hypertension; CKD (chronic kidney disease) stage 3, GFR 30-59 ml/min (08/01/2012); Hypoglycemia (07/26/2012); Anemia (08/01/2012); CVA (cerebral vascular accident); Pericardial effusion; and Peripheral arterial disease.  has past surgical history that includes Leg Surgery; Foot surgery; and Subxyphoid pericardial window (N/A, 11/07/2013). Prior to Admission medications   Medication Sig Start Date End Date Taking? Authorizing Provider  amiodarone (PACERONE) 200 MG tablet Take 200 mg by mouth daily.    Historical  Provider, MD  amLODipine (NORVASC) 2.5 MG tablet Take 2.5 mg by mouth daily.    Historical Provider, MD  aspirin 81 MG chewable tablet Chew 81 mg by mouth daily.    Historical Provider, MD  ferrous sulfate 325 (65 FE) MG tablet Take 325 mg by mouth 3 (three) times daily with meals.    Historical Provider, MD  furosemide (LASIX) 20 MG tablet Take 20 mg by mouth.    Historical Provider, MD  Lactobacillus (PROBIOTIC ACIDOPHILUS PO) Take 1 capsule by mouth 2 (two) times daily.    Historical Provider, MD  oxyCODONE (OXY IR/ROXICODONE) 5 MG immediate release tablet Take one tablet by mouth every four hours as needed for pain; Take two tablets by mouth every four hours as needed for pain 12/03/13   Oneal Grout, MD  potassium chloride SA (K-DUR,KLOR-CON) 20 MEQ tablet Take 20 mEq by mouth daily.    Historical Provider, MD  sennosides-docusate sodium (SENOKOT-S) 8.6-50 MG tablet Take 2 tablets by mouth 2 (two) times daily.    Historical Provider, MD  simvastatin (ZOCOR) 10 MG tablet Take 10 mg by mouth daily with lunch.    Historical Provider, MD  warfarin (COUMADIN) 1 MG tablet Take 1.5 mg by mouth daily. Take with 0.5 mg for a total of 1.5 mg daily    Historical Provider, MD   No Known Allergies  FAMILY HISTORY:  has no family status information on file.  SOCIAL HISTORY:  reports that he has never smoked. He has never used smokeless tobacco. He reports that he does not drink alcohol or use illicit drugs.  REVIEW OF SYSTEMS:  Unable as patient is encephalopathic.  SUBJECTIVE: Lower abdominal pain.  VITAL SIGNS: Temp:  [97.6 F (36.4 C)-98.9 F (37.2 C)] 97.6 F (36.4 C) (11/04 0400) Pulse Rate:  [50-96] 62 (11/04 0800) Resp:  [13-29] 14 (11/04 0800) BP: (80-148)/(38-87) 116/43 mmHg (11/04 0800) SpO2:  [93 %-100 %] 100 % (11/04 0800) Weight:  [81.7 kg (180 lb 1.9 oz)] 81.7 kg (180 lb 1.9 oz) (11/04 0400) HEMODYNAMICS: CVP:  [9 mmHg-17 mmHg] 11 mmHg VENTILATOR SETTINGS:   INTAKE /  OUTPUT:  Intake/Output Summary (Last 24 hours) at 02/04/14 [id number] Last data filed at 02/04/14 0800  Gross per 24 hour  Intake 1558.8 ml  Output    700 ml  Net  858.8 ml    PHYSICAL EXAMINATION: General:  Elderly male in NAD Neuro:  Alert to verbal, disoriented. Reportedly pretty diminished at baseline.  HEENT:  [place]/AT, no JVD noted. PERRL Cardiovascular:  A fib no MRG Lungs:  Clear bilateral lung sounds. Diminished bases.  Abdomen:  Soft, non-tender, non-distended Musculoskeletal:  Bandage to LLE, broken bone per patient although this seems unlikely given dressing.   Skin:  Intact  LABS:  CBC  Recent Labs Lab 02/02/14 1435 02/03/14 0015 02/04/14 0440  WBC 3.2* 13.2* 16.3*  HGB 9.9* 6.8* 8.4*  HCT 31.2* 20.8* 25.8*  PLT 174 92* 109*   Coag's  Recent Labs Lab 02/02/14 1435  INR 2.41*   BMET  Recent Labs Lab 02/02/14 1435 02/03/14 0015 02/04/14 0440  NA 142 139 142  K 4.6 4.2 4.4  CL 104 106 108  CO2 22 20 20   BUN 45* 49* 56*  CREATININE 1.93* 2.33* 2.39*  GLUCOSE 129* 127* 89   Electrolytes  Recent Labs Lab 02/02/14 1435 02/03/14 0015 02/04/14 0440  CALCIUM 8.9 7.4* 7.5*  MG  --   --  1.7  PHOS  --   --  3.4   Sepsis Markers  Recent Labs Lab 02/03/14 0330 02/03/14 1055 02/03/14 1432  LATICACIDVEN 2.6* 2.5* 1.5   ABG No results for input(s): PHART, PCO2ART, PO2ART in the last 168 hours. Liver Enzymes  Recent Labs Lab 02/02/14 1435 02/03/14 0015  AST 17 19  ALT 10 8  ALKPHOS 114 51  BILITOT 1.2 0.7  ALBUMIN 2.3* 1.6*   Cardiac Enzymes  Recent Labs Lab 02/03/14 0330 02/03/14 1055 02/03/14 1530  TROPONINI <0.30 <0.30 <0.30   Glucose  Recent Labs Lab 02/03/14 0837 02/03/14 1208 02/03/14 1619 02/03/14 2009 02/03/14 2300 02/04/14 0344  GLUCAP 141* 122* 80 117* 115* 83    Imaging Ct Pelvis Wo Contrast  02/03/2014   CLINICAL DATA:  Bladder pain. Low urine output. Scrotal edema which is new since earlier this morning.  Urinary retention. Urethral trauma, status post Foley catheter.Bladder pain R39.89 (ICD-10-CM) Low urine output R34 (ICD-10-CM)  EXAM: CT PELVIS WITHOUT CONTRAST  TECHNIQUE: Multidetector CT imaging of the pelvis was performed following the standard protocol without intravenous contrast.  COMPARISON:  CT of 1 day prior  FINDINGS: Nearly 7 cm stool ball within the rectum. Mild motion degradation throughout. Incompletely imaged left inguinal hernia containing and nonobstructive sigmoid colon. Extensive colonic diverticulosis. Normal caliber of small bowel loops. No pneumatosis.  No pelvic adenopathy. A right inguinal hernia contains fat and minimal fluid. Small volume paracolic gutter fluid or interstitial thickening is slightly increased, specially on the left. Example image 1.  Urinary bladder demonstrates an irregular mildly thickened wall. Focal gas and contrast filled outpouching at the bladder dome measures 3.7 cm on  image 23. Decreased since the prior exam. No surrounding inflammation. The tip of the Foley catheter is positioned within this outpouching  The bladder is contrast filled, but not well distended. The Foley balloon is now appropriately positioned. Normal prostate.  Diffuse anasarca.  No acute osseous abnormality.  IMPRESSION: 1. Mild motion degradation. 2. Bladder wall thickening and irregularity, likely related to bladder outlet obstruction. Focal outpouching of the bladder dome contains contrast and air. Although this could represent a diverticulum, given the clinical history, localized bladder injury cannot be excluded. Of note, the Foley catheter terminates in this outpouching 3. Foley catheter in place with incomplete bladder decompression. Unless the catheter has been clamped, this would suggest suboptimal catheter function. 4. Left larger than right inguinal hernias. Left-sided hernia contains nonobstructive transverse colon. 5. Increase in anasarca and incompletely imaged fluid or  interstitial thickening along the paracolic gutters. 6. Large stool ball in the rectum, suggesting constipation or fecal impaction.   Electronically Signed   By: Jeronimo Greaves M.D.   On: 02/03/2014 16:06   Dg Chest Port 1 View  02/03/2014   CLINICAL DATA:  Shock, status post central line placement  EXAM: PORTABLE CHEST - 1 VIEW  COMPARISON:  02/02/2014  FINDINGS: The cardiac shadow is stable. Patchy interstitial changes are again noted bilaterally. Poor inspiratory effort is again noted. A right central venous line is seen with the catheter tip in the distal superior vena cava. No pneumothorax is noted. No other focal abnormality is seen.  IMPRESSION: No pneumothorax following central line placement.  The chest is stable from the previous day.   Electronically Signed   By: Alcide Clever M.D.   On: 02/03/2014 11:38     ASSESSMENT / PLAN:  PULMONARY A: Bilateral lung opacities with L>R effusions  P:   Consider restarting home lasix 20mg  once hypotension resolved Titrate O2 for sats. IS and flutter valve. Up to chair today  CARDIOVASCULAR CVL >>> A:  Shock, septic +/- hemorrhagic- Resolved Chronic pericardial effusion (smaller 11/2 than prior study) Atrail fibrillation on coumadin and amio.  CVP 11 P:  MAP goal > 46mm/Hg. Home amiodarone Consider restarting home lasix 20mg  once hypotension resolved Consider restarting home zocor 10mg     RENAL A:   Acute on CKD with oliguria improving Chronic foley Hematuria resolved, traumatic foley insertion.  P:   IVF hydration Follow [facility] following, appreciate input  GASTROINTESTINAL A:   Large inguinal hernia Cholelithiasis, no inflammation  P:   SUP: Pantoprazole Advance diet  HEMATOLOGIC A:  Acute blood loss anemia Coumadin coagulopathy leukocytosis P:  Follow CBC, [facility] following.  Hold coumadin.   INFECTIOUS A:   Severe sepsis, urine likely source  P:   BCx2 11/2 >>> UC 11/2 >>> Abx:  Ceftriaxone, start date 11/2 > 11/2 Abx: Vancomycin, start date 11/3, day 2 Abx: Zosyn, start date 11/3, day 2 Trend WBC and fever curve  ENDOCRINE A:   DM  P:   CBG monitoring with SSI  NEUROLOGIC A:  Acute encephalopathy, unclear how far he is from baseline  H/o several CVA with residual confusion.  P:   RASS goal: 0 Monitor PRN fentanyl for analgesia.   FAMILY  - Updates: No family bedside.  - Inter-disciplinary family meet or Palliative Care meeting due by:  11/10

## 2014-02-04 NOTE — Progress Notes (Signed)
PHARMACIST - PHYSICIAN COMMUNICATION DR:   PCCM/TRH CONCERNING: IV to Oral Route Change Policy  RECOMMENDATION: This patient is receiving pantoprazole by the intravenous route.  Based on criteria approved by the Pharmacy and Therapeutics Committee, the intravenous medication(s) is/are being converted to the equivalent oral dose form(s).   DESCRIPTION: These criteria include:  The patient is eating (either orally or via tube) and/or has been taking other orally administered medications for a least 24 hours  The patient has no evidence of active gastrointestinal bleeding or impaired GI absorption (gastrectomy, short bowel, patient on TNA or NPO).  If you have questions about this conversion, please contact the Pharmacy Department  []   [phone] )  [facility] []   [phone] )  [facility]  []   [phone] )  [facility] [x]   [phone] )  [facility]   Juliette Alcide Paauilo, [facility] 02/04/2014 11:05 AM

## 2014-02-04 NOTE — Progress Notes (Signed)
Clinical Social Work Department BRIEF PSYCHOSOCIAL ASSESSMENT 02/04/2014  Patient:  [name], [name]     Account Number:  [id number]     Admit date:  02/02/2014  Clinical Social Worker:  [name]  Date/Time:  02/04/2014 04:00 PM  Referred by:  Physician  Date Referred:  02/04/2014 Referred for  SNF Placement   Other Referral:   Interview type:  Family Other interview type:    PSYCHOSOCIAL DATA Living Status:  FACILITY Admitted from facility:  [facility] Level of care:  Skilled Nursing Facility Primary support name:  [name]/[phone] Primary support relationship to patient:  CHILD, ADULT Degree of support available:   strong    CURRENT CONCERNS Current Concerns  Post-Acute Placement   Other Concerns:    SOCIAL WORK ASSESSMENT / PLAN CSW received referral that pt admitted from [facility] and Re[facility].    CSW received phone call from pt son, [name]. CSW introduced self and explained role. Pt son explained that pt is a resident at [facility] and currently under the long term care section. Pt son discussed that he has had a conversation with pt and pt family and they do not wish for pt to return to [facility]. Pt son agreeable to [facility] search and asked CSW to communicate with his sister, [name] regarding placement as he is out of town. CSW discussed with pt son that options may be limited due to pt being long term and pt son expressed understanding.    CSW received phone call from pt step-daughter, [name] whom pt son provided permission for CSW to discuss pt with. Pt step-daughter discussed that pt went to [facility] for rehab, but did not progress enough to be safe to return home. Pt step-daughter explained that pt and pt family were satisfied with the care pt was receiving during rehab at [facility], but once pt transition to long term care many concerns became present. Pt  step-daughter shared that pt family has had many discussions with the facility, but they have not seen a change. CSW discussed with pt step-daughter that CSW can explore SNF options, but options may be limited due to pt being long term care. Pt step-daughter expressed understanding, but hopeful that another option will be available.    CSW completed FL2 and initiated SNF search in [place].    CSW to follow up with pt family re: SNF bed offers.    CSW to continue to follow.   Assessment/plan status:  Psychosocial Support/Ongoing Assessment of Needs Other assessment/ plan:   discharge planning   Information/referral to community resources:   [facility] search    PATIENT'S/FAMILY'S RESPONSE TO PLAN OF CARE: Per chart, pt oriented to person and place, disoriented to time and situation. Pt family has not been satisfied with the care pt has been receiving at [facility]. Pt family hopeful that pt may have other options for pt family to explore for pt placement.    Loletta Specter, MSW, LCSW Clinical Social Work [phone]

## 2014-02-04 NOTE — Progress Notes (Addendum)
Clinical Social Work Department CLINICAL SOCIAL WORK PLACEMENT NOTE 02/04/2014  Patient:  [name], [name]  Account Number:  [id number] Admit date:  02/02/2014  Clinical Social Worker:  Jacelyn Grip  Date/time:  02/04/2014 04:28 PM  Clinical Social Work is seeking post-discharge placement for this patient at the following level of care:   SKILLED NURSING   (*CSW will update this form in Epic as items are completed)   02/04/2014  Patient/family provided with [facility] Department of Clinical Social Work's list of facilities offering this level of care within the geographic area requested by the patient (or if unable, by the patient's family).  02/04/2014  Patient/family informed of their freedom to choose among providers that offer the needed level of care, that participate in Medicare, Medicaid or managed care program needed by the patient, have an available bed and are willing to accept the patient.  02/04/2014  Patient/family informed of [facility]' ownership interest in [facility], as well as of the fact that they are under no obligation to receive care at this facility.  PASARR submitted to EDS on 02/04/2014 PASARR number received on 02/04/2014  FL2 transmitted to all facilities in geographic area requested by pt/family on  02/04/2014 FL2 transmitted to all facilities within larger geographic area on   Patient informed that his/her managed care company has contracts with or will negotiate with  certain facilities, including the following:     Patient/family informed of bed offers received:  02/05/2014 Patient chooses bed at  Physician recommends and patient chooses bed at    Patient to be transferred to  on   Patient to be transferred to facility by  Patient and family notified of transfer on  Name of family member notified:    The following physician request were entered in Epic:   Additional Comments:   Loletta Specter, MSW, LCSW Clinical Social  Work [phone]

## 2014-02-04 NOTE — Clinical Documentation Improvement (Signed)
  Please review WOC nurse assessment by Evlyn Courier RN dated 02/03/14 at 16:45.  ICD 10 requires that the presence of pressure ulcers, including all locations, must be documented by the attending physician and include whether or not Present on Admission (POA).  Please document the pressure ulcers, including locations, if you agree with the WOC nurse's assessment.    Thank You, Jerral Ralph ,RN Clinical Documentation Specialist:  [phone] [facility]- Health Information Management

## 2014-02-05 DIAGNOSIS — D62 Acute posthemorrhagic anemia: Secondary | ICD-10-CM

## 2014-02-05 DIAGNOSIS — N179 Acute kidney failure, unspecified: Secondary | ICD-10-CM

## 2014-02-05 DIAGNOSIS — R600 Localized edema: Secondary | ICD-10-CM

## 2014-02-05 DIAGNOSIS — N189 Chronic kidney disease, unspecified: Secondary | ICD-10-CM

## 2014-02-05 LAB — BASIC METABOLIC PANEL
Anion gap: 14 (ref 5–15)
BUN: 60 mg/dL — ABNORMAL HIGH (ref 6–23)
CO2: 18 meq/L — AB (ref 19–32)
Calcium: 8.1 mg/dL — ABNORMAL LOW (ref 8.4–10.5)
Chloride: 107 mEq/L (ref 96–112)
Creatinine, Ser: 2.48 mg/dL — ABNORMAL HIGH (ref 0.50–1.35)
GFR calc Af Amer: 27 mL/min — ABNORMAL LOW (ref >90)
GFR calc non Af Amer: 23 mL/min — ABNORMAL LOW (ref >90)
Glucose, Bld: 107 mg/dL — ABNORMAL HIGH (ref 70–99)
Potassium: 4.5 mEq/L (ref 3.7–5.3)
Sodium: 139 mEq/L (ref 137–147)

## 2014-02-05 LAB — PROTIME-INR
INR: 1.62 — ABNORMAL HIGH (ref 0.00–1.49)
Prothrombin Time: 19.4 seconds — ABNORMAL HIGH (ref 11.6–15.2)

## 2014-02-05 LAB — CBC
HEMATOCRIT: 27.2 % — AB (ref 39.0–52.0)
HEMOGLOBIN: 8.8 g/dL — AB (ref 13.0–17.0)
MCH: 27.8 pg (ref 26.0–34.0)
MCHC: 32.4 g/dL (ref 30.0–36.0)
MCV: 85.8 fL (ref 78.0–100.0)
Platelets: 107 10*3/uL — ABNORMAL LOW (ref 150–400)
RBC: 3.17 MIL/uL — ABNORMAL LOW (ref 4.22–5.81)
RDW: 15.6 % — ABNORMAL HIGH (ref 11.5–15.5)
WBC: 16.3 10*3/uL — ABNORMAL HIGH (ref 4.0–10.5)

## 2014-02-05 LAB — GLUCOSE, CAPILLARY
GLUCOSE-CAPILLARY: 106 mg/dL — AB (ref 70–99)
GLUCOSE-CAPILLARY: 132 mg/dL — AB (ref 70–99)
GLUCOSE-CAPILLARY: 140 mg/dL — AB (ref 70–99)
GLUCOSE-CAPILLARY: 91 mg/dL (ref 70–99)
Glucose-Capillary: 97 mg/dL (ref 70–99)
Glucose-Capillary: 114 mg/dL — ABNORMAL HIGH (ref 70–99)
Glucose-Capillary: 131 mg/dL — ABNORMAL HIGH (ref 70–99)

## 2014-02-05 LAB — HEMOGLOBIN A1C
HEMOGLOBIN A1C: 5.3 % (ref <5.7)
Mean Plasma Glucose: 105 mg/dL (ref <117)

## 2014-02-05 MED ORDER — FUROSEMIDE 40 MG PO TABS
40.0000 mg | ORAL_TABLET | Freq: Once | ORAL | Status: AC
  Administered 2014-02-05: 40 mg via ORAL
  Filled 2014-02-05: qty 1

## 2014-02-05 MED ORDER — HYDROCODONE-ACETAMINOPHEN 5-325 MG PO TABS
1.0000 | ORAL_TABLET | ORAL | Status: DC | PRN
Start: 2014-02-05 — End: 2014-02-10
  Administered 2014-02-05 – 2014-02-07 (×5): 1 via ORAL
  Filled 2014-02-05 (×7): qty 1

## 2014-02-05 MED ORDER — POLYETHYLENE GLYCOL 3350 17 G PO PACK: 17.0000 g | PACK | Freq: Every day | ORAL | Status: DC

## 2014-02-05 MED ORDER — FERROUS SULFATE 325 (65 FE) MG PO TABS
325.0000 mg | ORAL_TABLET | Freq: Three times a day (TID) | ORAL | Status: DC
  Administered 2014-02-05 – 2014-02-10 (×16): 325 mg via ORAL
  Filled 2014-02-05 (×18): qty 1

## 2014-02-05 MED ORDER — INSULIN ASPART 100 UNIT/ML ~~LOC~~ SOLN
0.0000 [IU] | Freq: Three times a day (TID) | SUBCUTANEOUS | Status: DC
  Administered 2014-02-05: 1 [IU] via SUBCUTANEOUS

## 2014-02-05 MED ORDER — SENNA 8.6 MG PO TABS: 2.0000 | ORAL_TABLET | Freq: Every day | ORAL | Status: DC
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
## 2014-02-05 NOTE — Progress Notes (Signed)
Pt transferred from ICU to [id number], alert and oriented with periods of confusion. Coude cath inplace, inserted by urology, pressure ulcers on bil heels-unstagable ulcer and scrotum red and swollen, elevated. Dressing change order per  [facility] nurse. Oriented pt to room/unit and reviewed plan of care with patient, will continue to assess patient.

## 2014-02-05 NOTE — Progress Notes (Signed)
[facility] PROGRESS NOTE  [name] ZOX:[id number] DOB: 11/16/1933 DOA: 02/02/2014 PCP: Dorrene German, MD  Brief Summary  80 yo M with hx of dementia, DM, HTN, CVA with indwelling foley cath due to BPH who presented to the ED from [facility] after developing gross hematuria after a traumatic foley insertion.  Post catheter exchange, large amounts of gross blood were expressed via cath.  CT scan demonstrated inflammatory process at teh dome of teh bladder with gas and malpositioned foley catheter.  The patient was referred to the ED, where initial hgb was 9.9 but quickly dropped to 6.8 mg/dl.  Urology was consulted and he had a coude catheter placed.  His urine has since cleared, however, he has had low uop.  He was also initially mildly hypotensive with lactate of over 4.  He became increasingly hypotensive the day of admission and was transferred to the ICU under care of critical care and was started on dopamine and levophed, antibiotics for presumptive urosepsis, and given blood transfusion.  He was transitioned off vasopressors on 11/4 and transitioned back to [facility] service on 11/5.    Assessment/Plan  Septic and hemorrhagic shock, now resolved.  Septic shock, unclear source but likely urinary although suggestion of infiltrates vs. Pulmonary edema on CXR, clinically improved -  Blood cultures were not obtained -  Urine culture 11/23 pending -  Continue zosyn, day 3  BPH with chronic foley and recent hematuria due to traumatic foley insertion. -  Appreciate Urology assistance -  Continue coude catheter  Acute on CKD stage III with oliguria, likely due to shock, uop increased to > 1L yesterday, BUN and creatinine possibly plateauing but developing mild metabolic acidosis.  No hyperkalemia.  Baseline creatinine of 1.8. -  Continue foley -  Trend BMP -  Trial of lasix x 1 today  Bilateral lung opacities with L>R effusions, likely pulmonary edema from volume resuscitation -  Trial  of lasix 40mg  PO once -  IS and flutter valve -  OOB  Chronic pericardial effusion (smaller 11/2 than prior study)  Atrial fibrillation on coumadin and amio, sinus bradycardia on telemetry -  Continue amiodarone -  Continue zocor 10mg   -  Coumadin held due to recent hematuria  Acute blood loss anemia superimposed on iron deficiency anemia, hemoglobin stable/improved -  Resume home iron supplementation  Leukocytosis due to septic shock, plateauing  -  Repeat CBC in AM   DM type 2, CBG well controlled. -  check A1c -  Change to meal time low dose SSI  Acute encephalopathy superimposed on vascular dementia   LLE left heel wound w/ exposed hardware, unstageable, stage 3 right heel ulcer, & Sacral ulcer -  Appreciate WOC and orthopedics assistance -  Plan for rod removal as outpatient  Large inguinal hernia & asymptomatic cholelithiasis, outpatient follow up  Generalized weakness due to recent severe illness -  PT evaluation  Diet:  Healthy heart Access:  Right IJ TLC placed 11/3.  Place PIV and remove CVC ASAP IVF:  off Proph:  SCD  Code Status: full Family Communication: patient alone Disposition Plan: pending PT evaluation, results of urine culture, improvement in breathing   Consultants:  Critical care, Dr. Koren Bound  Orthopedic surgery, Dr. Aldean Baker  Urology, Dr. Jerilee Field  Procedures: 11/2 inflammatory process at teh dome of teh bladder with gas and malpositioned foley catheter.   11/3 CT pelvis:  diverticulum at the bladder dome which is stable, catheter is positioned well within the  bladder  Antibiotics: Ceftriaxone, start date 11/2 > 11/2 Abx: Vancomycin, start date 11/3, day 2-->DC, will cont: Zosyn, start date 11/3, day 2  HPI/Subjective:  Has pain in right heal.    Objective: Filed Vitals:   02/05/14 0500 02/05/14 0530 02/05/14 0545 02/05/14 0600  BP: 133/45 110/56  80/61  Pulse: 53 51 54 56  Temp:      TempSrc:      Resp: 14  19 18 18   Height:      Weight:      SpO2: 97% 99% 98% 98%    Intake/Output Summary (Last 24 hours) at 02/05/14 0741 Last data filed at 02/05/14 0600  Gross per 24 hour  Intake   1749 ml  Output   1120 ml  Net    629 ml   Filed Weights   02/02/14 1726 02/04/14 0400  Weight: 73.5 kg (162 lb 0.6 oz) 81.7 kg (180 lb 1.9 oz)    Exam:   General:  WM, No acute distress  HEENT:  NCAT, MMM  Cardiovascular:  RRR, nl S1, S2 no mrg, 2+ pulses, warm extremities  Respiratory:  Rales posteriorly with wheezes, no rhonchi, no increased WOB  Abdomen:   NABS, soft, NT/ND  GU:  Scrotum swollen and TTP, mildly erythematous, no rash, coude in place, no blood in urine  MSK:   Normal tone and bulk, 1+ LLE edema and mild injection with warmth  Neuro:  Grossly moves all extremities  Bilateral heel ulcesr with hardware protruding on left heel.  Sacrum not observed today.    Data Reviewed: Basic Metabolic Panel:  Recent Labs Lab 02/02/14 1435 02/03/14 0015 02/04/14 0440 02/05/14 0533  NA 142 139 142 139  K 4.6 4.2 4.4 4.5  CL 104 106 108 107  CO2 22 20 20  18*  GLUCOSE 129* 127* 89 107*  BUN 45* 49* 56* 60*  CREATININE 1.93* 2.33* 2.39* 2.48*  CALCIUM 8.9 7.4* 7.5* 8.1*  MG  --   --  1.7  --   PHOS  --   --  3.4  --    Liver Function Tests:  Recent Labs Lab 02/02/14 1435 02/03/14 0015  AST 17 19  ALT 10 8  ALKPHOS 114 51  BILITOT 1.2 0.7  PROT 6.5 4.5*  ALBUMIN 2.3* 1.6*   No results for input(s): LIPASE, AMYLASE in the last 168 hours. No results for input(s): AMMONIA in the last 168 hours. CBC:  Recent Labs Lab 02/02/14 1435 02/03/14 0015 02/04/14 0440 02/05/14 0533  WBC 3.2* 13.2* 16.3* 16.3*  NEUTROABS 2.9  --   --   --   HGB 9.9* 6.8* 8.4* 8.8*  HCT 31.2* 20.8* 25.8* 27.2*  MCV 84.8 86.3 86.3 85.8  PLT 174 92* 109* 107*   Cardiac Enzymes:  Recent Labs Lab 02/03/14 0330 02/03/14 1055 02/03/14 1530  TROPONINI <0.30 <0.30 <0.30   BNP (last 3  results) No results for input(s): PROBNP in the last 8760 hours. CBG:  Recent Labs Lab 02/04/14 1300 02/04/14 1641 02/04/14 1945 02/04/14 2348 02/05/14 0333  GLUCAP 107* 106* 132* 91 106*    Recent Results (from the past 240 hour(s))  MRSA PCR Screening     Status: None   Collection Time: 02/02/14  5:26 PM  Result Value Ref Range Status   MRSA by PCR NEGATIVE NEGATIVE Final    Comment:        The GeneXpert MRSA Assay (FDA approved for NASAL specimens only), is one component of a  comprehensive MRSA colonization surveillance program. It is not intended to diagnose MRSA infection nor to guide or monitor treatment for MRSA infections.   Culture, Urine     Status: None (Preliminary result)   Collection Time: 02/02/14  6:55 PM  Result Value Ref Range Status   Specimen Description URINE, CLEAN CATCH  Final   Special Requests NONE  Final   Culture  Setup Time   Final    02/03/2014 10:04 Performed at [facility] Count PENDING  Incomplete   Culture   Final    Culture reincubated for better growth Performed at [facility]    Report Status PENDING  Incomplete     Studies: Ct Pelvis Wo Contrast  02/03/2014   CLINICAL DATA:  Bladder pain. Low urine output. Scrotal edema which is new since earlier this morning. Urinary retention. Urethral trauma, status post Foley catheter.Bladder pain R39.89 (ICD-10-CM) Low urine output R34 (ICD-10-CM)  EXAM: CT PELVIS WITHOUT CONTRAST  TECHNIQUE: Multidetector CT imaging of the pelvis was performed following the standard protocol without intravenous contrast.  COMPARISON:  CT of 1 day prior  FINDINGS: Nearly 7 cm stool ball within the rectum. Mild motion degradation throughout. Incompletely imaged left inguinal hernia containing and nonobstructive sigmoid colon. Extensive colonic diverticulosis. Normal caliber of small bowel loops. No pneumatosis.  No pelvic adenopathy. A right inguinal hernia contains fat and minimal  fluid. Small volume paracolic gutter fluid or interstitial thickening is slightly increased, specially on the left. Example image 1.  Urinary bladder demonstrates an irregular mildly thickened wall. Focal gas and contrast filled outpouching at the bladder dome measures 3.7 cm on image 23. Decreased since the prior exam. No surrounding inflammation. The tip of the Foley catheter is positioned within this outpouching  The bladder is contrast filled, but not well distended. The Foley balloon is now appropriately positioned. Normal prostate.  Diffuse anasarca.  No acute osseous abnormality.  IMPRESSION: 1. Mild motion degradation. 2. Bladder wall thickening and irregularity, likely related to bladder outlet obstruction. Focal outpouching of the bladder dome contains contrast and air. Although this could represent a diverticulum, given the clinical history, localized bladder injury cannot be excluded. Of note, the Foley catheter terminates in this outpouching 3. Foley catheter in place with incomplete bladder decompression. Unless the catheter has been clamped, this would suggest suboptimal catheter function. 4. Left larger than right inguinal hernias. Left-sided hernia contains nonobstructive transverse colon. 5. Increase in anasarca and incompletely imaged fluid or interstitial thickening along the paracolic gutters. 6. Large stool ball in the rectum, suggesting constipation or fecal impaction.   Electronically Signed   By: Jeronimo Greaves M.D.   On: 02/03/2014 16:06   Dg Chest Port 1 View  02/04/2014   CLINICAL DATA:  Hypoxemia.  EXAM: PORTABLE CHEST - 1 VIEW  COMPARISON:  02/03/2014  FINDINGS: Right jugular central venous catheter remains in place with tip overlying the lower SVC. Cardiac silhouette remains enlarged. Thoracic aortic calcification is again seen. There is a small left pleural effusion, which appears larger than on the prior study. Trace right pleural effusion is also present, with fluid noted in the  right minor fissure. Bilateral interstitial opacities have mildly increased from the prior study. More confluent opacities are present at the left greater than right lung bases. No pneumothorax is identified.  IMPRESSION: Mildly worsening bilateral lung opacities with small left larger than right pleural effusions, suggestive of edema.   Electronically Signed   By: Freida Busman  Mosetta PuttGrady   On: 02/04/2014 08:06   Dg Chest Port 1 View  02/03/2014   CLINICAL DATA:  Shock, status post central line placement  EXAM: PORTABLE CHEST - 1 VIEW  COMPARISON:  02/02/2014  FINDINGS: The cardiac shadow is stable. Patchy interstitial changes are again noted bilaterally. Poor inspiratory effort is again noted. A right central venous line is seen with the catheter tip in the distal superior vena cava. No pneumothorax is noted. No other focal abnormality is seen.  IMPRESSION: No pneumothorax following central line placement.  The chest is stable from the previous day.   Electronically Signed   By: Alcide CleverMark  Lukens M.D.   On: 02/03/2014 11:38    Scheduled Meds: . amiodarone  200 mg Oral Daily  . antiseptic oral rinse  7 mL Mouth Rinse q12n4p  . chlorhexidine  15 mL Mouth Rinse BID  . docusate sodium  100 mg Oral BID  . insulin aspart  2-6 Units Subcutaneous 6 times per day  . pantoprazole  40 mg Oral Daily  . piperacillin-tazobactam (ZOSYN)  IV  3.375 g Intravenous 3 times per day  . simvastatin  10 mg Oral Q lunch  . sodium chloride  3 mL Intravenous Q12H   Continuous Infusions: . sodium chloride 10 mL/hr (02/03/14 1108)    Principal Problem:   Lactic acidosis Active Problems:   Diabetes mellitus without complication   Hypertension   Anemia   Hematuria   Lactic acid acidosis   Severe sepsis with septic shock    Time spent: 30 min    Skylier Kretschmer, [facility] AntonioMACKENZIE  [facility] Pager [phone]. If 7PM-7AM, please contact night-coverage at [email or web address], password [email or web address] 02/05/2014, 7:41 AM  LOS: 3 days

## 2014-02-05 NOTE — Progress Notes (Signed)
CSW continuing to follow for disposition planning.  Pt admitted from [facility], but pt and pt family interested in exploring other options for placement.  CSW contacted pt step-daughter, Dossie Arbour [phone]) via telephone. CSW provided pt step-daughter with SNF bed offers. CSW left pt SNF bed offers at bedside as well. Pt step-daughter plans to tour facilities to make decision. Pt step-daughter reports that she is leaning toward [facility] or [facility].  CSW to follow up with pt family re: decision for SNF.   Please note that pt son, Lief Speros is currently out of town and has asked CSW to communicate with pt step-daughter, Dossie Arbour regarding SNF and has deferred decision to pt step-daughter as pt son out of town, pt has some confusion, and process is to overwhelming for pt wife per pt son.   CSW to provide handoff to [facility] CSW, Lincoln Maxin as pt transferred to 1429 today.  CSW to continue to follow.  Loletta Specter, MSW, LCSW Clinical Social Work [phone]

## 2014-02-05 NOTE — Progress Notes (Signed)
PT Cancellation Note  Patient Details Name: [name] MRN: [id number] DOB: [birth year]   Cancelled Treatment:    Reason Eval/Treat Not Completed: Medical issues which prohibited therapy (LE dopplers ordered, will await results)   [name],[name] 02/05/2014, 10:24 AM Zenovia Jarred, PT, DPT 02/05/2014 Pager: [phone]

## 2014-02-05 NOTE — Progress Notes (Signed)
Bilateral lower extremity venous duplex completed:  No obvious evidence of DVT, superficial thrombosis, or Baker's cyst.  Technically difficult study due to the edema.

## 2014-02-05 NOTE — Discharge Instructions (Signed)
Wear the lambswool PRAFO boots 24 hours a day.

## 2014-02-05 NOTE — Progress Notes (Signed)
Patient ID: [name], male   DOB: 12-Dec-1933, 80 y.o.   MRN: [id number]  Catheter draining well.  Patient improving and moving to the floor.  Filed Vitals:   02/05/14 0600  BP: 80/61  Pulse: 56  Temp:   Resp: 18    Intake/Output Summary (Last 24 hours) at 02/05/14 0831 Last data filed at 02/05/14 0600  Gross per 24 hour  Intake   1739 ml  Output   1010 ml  Net    729 ml   PE: No acute distress, alert and oriented Abdomen was soft nontender Foley in place urine clear  BMET    Component Value Date/Time   NA 139 02/05/2014 0533   K 4.5 02/05/2014 0533   CL 107 02/05/2014 0533   CO2 18* 02/05/2014 0533   GLUCOSE 107* 02/05/2014 0533   BUN 60* 02/05/2014 0533   CREATININE 2.48* 02/05/2014 0533   CALCIUM 8.1* 02/05/2014 0533   GFRNONAA 23* 02/05/2014 0533   GFRAA 27* 02/05/2014 0533    Impression-urethral trauma Plan -Continue Foley catheter changing every 3-4 weeks as normal.  I would recommend at least a 16 French Foley catheter. -Follow-up with me in outpatient for cystoscopy and voiding trial.  Discussed with patient and he is agreeable.  Will sign off, call with questions.

## 2014-02-05 NOTE — Plan of Care (Signed)
Problem: Phase II Progression Outcomes Goal: Discharge plan established Outcome: Progressing     

## 2014-02-06 DIAGNOSIS — N189 Chronic kidney disease, unspecified: Secondary | ICD-10-CM

## 2014-02-06 DIAGNOSIS — N179 Acute kidney failure, unspecified: Secondary | ICD-10-CM

## 2014-02-06 LAB — BASIC METABOLIC PANEL
ANION GAP: 16 — AB (ref 5–15)
BUN: 60 mg/dL — ABNORMAL HIGH (ref 6–23)
CO2: 20 mEq/L (ref 19–32)
Calcium: 8.5 mg/dL (ref 8.4–10.5)
Chloride: 103 mEq/L (ref 96–112)
Creatinine, Ser: 2.54 mg/dL — ABNORMAL HIGH (ref 0.50–1.35)
GFR calc Af Amer: 26 mL/min — ABNORMAL LOW (ref >90)
GFR, EST NON AFRICAN AMERICAN: 23 mL/min — AB (ref >90)
Glucose, Bld: 99 mg/dL (ref 70–99)
POTASSIUM: 4.3 meq/L (ref 3.7–5.3)
SODIUM: 139 meq/L (ref 137–147)

## 2014-02-06 LAB — URINE CULTURE

## 2014-02-06 LAB — CBC
HCT: 28.8 % — ABNORMAL LOW (ref 39.0–52.0)
Hemoglobin: 9.3 g/dL — ABNORMAL LOW (ref 13.0–17.0)
MCH: 27.4 pg (ref 26.0–34.0)
MCHC: 32.3 g/dL (ref 30.0–36.0)
MCV: 85 fL (ref 78.0–100.0)
Platelets: 131 10*3/uL — ABNORMAL LOW (ref 150–400)
RBC: 3.39 MIL/uL — AB (ref 4.22–5.81)
RDW: 15.4 % (ref 11.5–15.5)
WBC: 16.1 10*3/uL — AB (ref 4.0–10.5)

## 2014-02-06 LAB — PROTIME-INR
INR: 1.52 — ABNORMAL HIGH (ref 0.00–1.49)
Prothrombin Time: 18.5 seconds — ABNORMAL HIGH (ref 11.6–15.2)

## 2014-02-06 LAB — GLUCOSE, CAPILLARY
GLUCOSE-CAPILLARY: 108 mg/dL — AB (ref 70–99)
Glucose-Capillary: 108 mg/dL — ABNORMAL HIGH (ref 70–99)

## 2014-02-06 MED ORDER — ALBUTEROL SULFATE (2.5 MG/3ML) 0.083% IN NEBU
2.5000 mg | INHALATION_SOLUTION | Freq: Four times a day (QID) | RESPIRATORY_TRACT | Status: DC | PRN
  Administered 2014-02-06: 2.5 mg via RESPIRATORY_TRACT
  Filled 2014-02-06: qty 3

## 2014-02-06 NOTE — Progress Notes (Signed)
[facility] PROGRESS NOTE  [name] BZJ:[id number] DOB: December 14, 1933 DOA: 02/02/2014 PCP: Dorrene German, MD  Brief Summary  80 yo M with hx of dementia, DM, HTN, CVA with indwelling foley cath due to BPH who presented to the ED from [facility] after developing gross hematuria after a traumatic foley insertion.  Post catheter exchange, large amounts of gross blood were expressed via cath.  CT scan demonstrated inflammatory process at teh dome of teh bladder with gas and malpositioned foley catheter.  The patient was referred to the ED, where initial hgb was 9.9 but quickly dropped to 6.8 mg/dl.  Urology was consulted and he had a coude catheter placed.  His urine has since cleared, however, he has had low uop.  He was also initially mildly hypotensive with lactate of over 4.  He became increasingly hypotensive the day of admission and was transferred to the ICU under care of critical care and was started on dopamine and levophed, antibiotics for presumptive urosepsis, and given blood transfusion.  He was transitioned off vasopressors on 11/4 and [facility]    Assessment/Plan  Septic and hemorrhagic shock, now resolved.  Septic shock, unclear source but likely urinary although suggestion of infiltrates vs. Pulmonary edema on CXR, clinically improved -  Blood cultures were not obtained -  Urine culture 11/23 pending -  Continue zosyn, day 4  BPH with chronic foley and recent hematuria due to traumatic foley insertion. -  Appreciate Urology assistance -  Continue at least 54F foley for 3-4 weeks   Acute on CKD stage III with oliguria, likely due to shock, uop increased to > 1L yesterday, BUN and creatinine possibly plateauing but developing mild metabolic acidosis.  No hyperkalemia.  Baseline creatinine of 1.8. -  Continue foley  -  Trend BMP  Bilateral lung opacities with L>R effusions, likely pulmonary edema from volume resuscitation -  Given lasix  40mg  PO once on 11/5 -  IS and flutter valve -  OOB  Chronic pericardial effusion (smaller 11/2 than prior study)  Atrial fibrillation on coumadin and amio, sinus bradycardia on telemetry, stable -  Continue amiodarone  -  Continue zocor 10mg    -  Coumadin held due to recent hematuria -  Okay to d/c telemetry  Acute blood loss anemia superimposed on iron deficiency anemia, hemoglobin stable/improved -  Resume home iron supplementation  Leukocytosis due to septic shock, plateauing  -  Repeat CBC in AM   Hx of DM type 2, diet controlled, A1c 5.3 -  D/c SSI  Acute encephalopathy superimposed on vascular dementia   LLE left heel wound w/ exposed hardware, unstageable, stage 3 right heel ulcer, & Sacral ulcer -  Appreciate WOC and orthopedics assistance -  Plan for rod removal as outpatient  LLE swelling -  Duplex neg for DVT  Large inguinal hernia & asymptomatic cholelithiasis, outpatient follow up  Generalized weakness due to recent severe illness -  PT evaluation pending  Diet:  Healthy heart Access:  Right IJ TLC placed 11/3.  Place PIV and remove CVC ASAP IVF:  off Proph:  SCD  Code Status: full Family Communication: patient alone Disposition Plan: pending PT evaluation, results of urine culture, improvement in creatinine   Consultants:  Critical care, Dr. Koren Bound  Orthopedic surgery, Dr. Aldean Baker  Urology, Dr. Jerilee Field  Procedures: 11/2 inflammatory process at teh dome of teh bladder with gas and malpositioned foley catheter.   11/3 CT pelvis:  diverticulum  at the bladder dome which is stable, catheter is positioned well within the bladder  Antibiotics: Ceftriaxone, start date 11/2 > 11/2 Vancomycin, 11/3 > 11/4 Zosyn 11/3 >>  HPI/Subjective:  States that he is eating okay, hungry for breakfast.  Denies abdominal pain.  Feels like he may have BM today  Objective: Filed Vitals:   02/05/14 2116 02/06/14 0400 02/06/14 0502 02/06/14  0701  BP: 140/65   132/56  Pulse: 72 67  63  Temp: 98 F (36.7 C)   98.1 F (36.7 C)  TempSrc: Oral   Oral  Resp: 16   16  Height:      Weight:      SpO2: 97% 97% 95% 96%    Intake/Output Summary (Last 24 hours) at 02/06/14 0711 Last data filed at 02/06/14 [id number]  Gross per 24 hour  Intake    800 ml  Output   1225 ml  Net   -425 ml   Filed Weights   02/02/14 1726 02/04/14 0400  Weight: 73.5 kg (162 lb 0.6 oz) 81.7 kg (180 lb 1.9 oz)    Exam:   General:  WM, No acute distress  HEENT:  NCAT, MMM, CVC still in right neck  Cardiovascular:  RRR, nl S1, S2 no mrg, 2+ pulses, warm extremities  Respiratory:  CTAB, no increased WOB  Abdomen:   NABS, soft, NT/ND  GU:  Scrotum swollen, mildly erythematous, no rash, catheter in place, trace sediment in urine  MSK:   Normal tone and bulk, 1+ LLE edema and mild injection with warmth  Neuro:  Grossly moves all extremities  Bilateral heel ulcers dressed.  Not observed today    Data Reviewed: Basic Metabolic Panel:  Recent Labs Lab 02/02/14 1435 02/03/14 0015 02/04/14 0440 02/05/14 0533 02/06/14 0520  NA 142 139 142 139 139  K 4.6 4.2 4.4 4.5 4.3  CL 104 106 108 107 103  CO2 22 20 20  18* 20  GLUCOSE 129* 127* 89 107* 99  BUN 45* 49* 56* 60* 60*  CREATININE 1.93* 2.33* 2.39* 2.48* 2.54*  CALCIUM 8.9 7.4* 7.5* 8.1* 8.5  MG  --   --  1.7  --   --   PHOS  --   --  3.4  --   --    Liver Function Tests:  Recent Labs Lab 02/02/14 1435 02/03/14 0015  AST 17 19  ALT 10 8  ALKPHOS 114 51  BILITOT 1.2 0.7  PROT 6.5 4.5*  ALBUMIN 2.3* 1.6*   No results for input(s): LIPASE, AMYLASE in the last 168 hours. No results for input(s): AMMONIA in the last 168 hours. CBC:  Recent Labs Lab 02/02/14 1435 02/03/14 0015 02/04/14 0440 02/05/14 0533 02/06/14 0520  WBC 3.2* 13.2* 16.3* 16.3* 16.1*  NEUTROABS 2.9  --   --   --   --   HGB 9.9* 6.8* 8.4* 8.8* 9.3*  HCT 31.2* 20.8* 25.8* 27.2* 28.8*  MCV 84.8 86.3 86.3  85.8 85.0  PLT 174 92* 109* 107* 131*   Cardiac Enzymes:  Recent Labs Lab 02/03/14 0330 02/03/14 1055 02/03/14 1530  TROPONINI <0.30 <0.30 <0.30   BNP (last 3 results) No results for input(s): PROBNP in the last 8760 hours. CBG:  Recent Labs Lab 02/05/14 0333 02/05/14 0737 02/05/14 1250 02/05/14 1619 02/05/14 2110  GLUCAP 106* 97 114* 131* 140*    Recent Results (from the past 240 hour(s))  MRSA PCR Screening     Status: None   Collection Time: 02/02/14  5:26 PM  Result Value Ref Range Status   MRSA by PCR NEGATIVE NEGATIVE Final    Comment:        The GeneXpert MRSA Assay (FDA approved for NASAL specimens only), is one component of a comprehensive MRSA colonization surveillance program. It is not intended to diagnose MRSA infection nor to guide or monitor treatment for MRSA infections.   Culture, Urine     Status: None (Preliminary result)   Collection Time: 02/02/14  6:55 PM  Result Value Ref Range Status   Specimen Description URINE, CLEAN CATCH  Final   Special Requests NONE  Final   Culture  Setup Time   Final    02/03/2014 10:04 Performed at [facility] Count   Final    >=100,000 COLONIES/ML Performed at [facility]    Culture   Final    GRAM NEGATIVE RODS Performed at [facility]    Report Status PENDING  Incomplete     Studies: No results found.  Scheduled Meds: . amiodarone  200 mg Oral Daily  . docusate sodium  100 mg Oral BID  . ferrous sulfate  325 mg Oral TID WC  . insulin aspart  0-9 Units Subcutaneous TID WC  . pantoprazole  40 mg Oral Daily  . piperacillin-tazobactam (ZOSYN)  IV  3.375 g Intravenous 3 times per day  . simvastatin  10 mg Oral Q lunch  . sodium chloride  3 mL Intravenous Q12H   Continuous Infusions: . sodium chloride 10 mL/hr (02/03/14 1108)    Principal Problem:   Lactic acidosis Active Problems:   Diabetes mellitus without complication   Hypertension   Anemia    Hematuria   Lactic acid acidosis   Severe sepsis with septic shock   Acute on chronic kidney failure   Acute blood loss anemia    Time spent: 30 min    Gregori Abril  [facility] Pager [phone]. If 7PM-7AM, please contact night-coverage at [email or web address], password [facility] 02/06/2014, 7:11 AM  LOS: 4 days

## 2014-02-06 NOTE — Progress Notes (Signed)
ANTIBIOTIC CONSULT NOTE - FOLLOW UP  Pharmacy Consult for Zosyn Indication: suspected urosepsis  No Known Allergies  Patient Measurements: Height: 5\' 6"  (167.6 cm) Weight: 180 lb 1.9 oz (81.7 kg) IBW/kg (Calculated) : 63.8  Vital Signs: Temp: 98.1 F (36.7 C) (11/06 0701) Temp Source: Oral (11/06 0701) BP: 132/56 mmHg (11/06 0701) Pulse Rate: 63 (11/06 0701) Intake/Output from previous day: 11/05 0701 - 11/06 0700 In: 880 [P.O.:480; I.V.:250; IV Piggyback:150] Out: 900 [Urine:900] Intake/Output from this shift: Total I/O In: -  Out: 325 [Urine:325]  Labs:  Recent Labs  02/04/14 0440 02/05/14 0533 02/06/14 0520  WBC 16.3* 16.3* 16.1*  HGB 8.4* 8.8* 9.3*  PLT 109* 107* 131*  CREATININE 2.39* 2.48* 2.54*    Assessment: 80 yo M admitted via ER from nursing home. Patient treated for UTI about 1 month ago. Patient has indwelling catheter that was changed 11/2 (traumatic procedure)--gross blood expressed via catheter.  Pharmacy consulted to dose Zosyn for suspected urosepsis.  11/1 >> Rocephin >> 11/3 11/3 >> zosyn >>  11/3 >> vanc >> 11/4  Tmax: afebrile WBCs: elevated/stable Renal: SCr 2.48 (continues to trend up), UOP OK, CrCl~27CG  11/2 urine: > 100k Proteus mirabilis (R-Amp/NTF, otherwise sensitive)  Today is day #4 Zosyn 3.375g IV Q8H infused over 4hrs.  Goal of Therapy:  Doses adjusted per renal function Eradication of infection  Plan:  1.  Continue Zosyn 3.375g IV q8h (4 hour infusion time) since CrCl still >20 ml/min. 2.  Now that urine culture finalized with sensitivities, consider narrowing antibiotics.  Clance Boll 02/06/2014,1:09 PM

## 2014-02-06 NOTE — Evaluation (Addendum)
Physical Therapy Evaluation Patient Details Name: [name] MRN: [id number] DOB: [birth year] Today's Date: 02/06/2014   History of Present Illness  80 yo M with hx of dementia, DM, HTN, CVA with indwelling foley cath due to BPH admitted from [facility] [place] after developing gross hematuria after a traumatic foley insertion and diagnosed with septic and hemorrhagic shock.  Clinical Impression  Pt currently with functional limitations due to the deficits listed below (see PT Problem List).  Pt will benefit from skilled PT to increase their independence and safety with mobility to allow discharge to the venue listed below.  Pt requiring total assist +2 for bed mobility at this time and unable to stand without total assist as well however poor compliance with NWB so returned to sitting.   Pt with scrotal edema causing pain with mobility as well.       Follow Up Recommendations SNF    Equipment Recommendations  None recommended by PT    Recommendations for Other Services       Precautions / Restrictions Precautions Precautions: Fall Restrictions Weight Bearing Restrictions: Yes LLE Weight Bearing: Non weight bearing      Mobility  Bed Mobility Overal bed mobility: Needs Assistance;+2 for physical assistance Bed Mobility: Supine to Sit;Sit to Supine     Supine to sit: Max assist;+2 for physical assistance Sit to supine: Max assist;+2 for physical assistance   General bed mobility comments: assist for upper and lower body, increased time due to scrotal pain (edema)  Transfers Overall transfer level: Needs assistance Equipment used: Rolling walker (2 wheeled) Transfers: Sit to/from Stand Sit to Stand: +2 physical assistance;Total assist;From elevated surface         General transfer comment: pt unable to stand fully erect due to weakness also started WB but NWB status so had pt return to sitting, pt unable to lateral scoot up HOB in sitting as well   Ambulation/Gait                 Stairs            Wheelchair Mobility    Modified Rankin (Stroke Patients Only)       Balance Overall balance assessment: Needs assistance Sitting-balance support: Bilateral upper extremity supported;Feet supported Sitting balance-Leahy Scale: Fair                                       Pertinent Vitals/Pain Pain Assessment: Faces Faces Pain Scale: Hurts whole lot Pain Location: scrotum Pain Descriptors / Indicators: Sore;Tender Pain Intervention(s): Limited activity within patient's tolerance;Monitored during session;Repositioned    Home Living Family/patient expects to be discharged to:: Skilled nursing facility                      Prior Function Level of Independence: Needs assistance   Gait / Transfers Assistance Needed: reports ambulatory with RW     Comments: states he had PT at SNF however they had stopped seeing him     Hand Dominance        Extremity/Trunk Assessment   Upper Extremity Assessment: Generalized weakness           Lower Extremity Assessment: Generalized weakness         Communication   Communication: No difficulties  Cognition Arousal/Alertness: Awake/alert Behavior During Therapy: WFL for tasks assessed/performed Overall Cognitive Status: Within Functional Limits for tasks assessed  General Comments General comments (skin integrity, edema, etc.): with with sacral and bil heel pressure ulcer, also with exposed L calcaneal hardware per notes (heels with dressings during session), use PRAFOs for positioning, also scrotal edema which was elevated with washcloth    Exercises        Assessment/Plan    PT Assessment Patient needs continued PT services  PT Diagnosis Difficulty walking;Generalized weakness   PT Problem List Decreased strength;Decreased activity tolerance;Decreased mobility;Decreased knowledge of use of DME;Decreased safety  awareness;Decreased knowledge of precautions;Pain;Decreased skin integrity  PT Treatment Interventions DME instruction;Gait training;Functional mobility training;Therapeutic activities;Therapeutic exercise;Patient/family education;Wheelchair mobility training   PT Goals (Current goals can be found in the Care Plan section) Acute Rehab PT Goals PT Goal Formulation: With patient Time For Goal Achievement: 02/20/14 Potential to Achieve Goals: Good    Frequency Min 3X/week   Barriers to discharge        Co-evaluation               End of Session   Activity Tolerance: Patient limited by pain;Patient limited by fatigue Patient left: in bed;with call bell/phone within reach;with bed alarm set           Time: 1412-1431 PT Time Calculation (min): 19 min   Charges:   PT Evaluation $Initial PT Evaluation Tier I: 1 Procedure PT Treatments $Therapeutic Activity: 8-22 mins   PT G Codes:          Capitola Ladson,KATHrine E 02/06/2014, 3:35 PM Zenovia JarredKati Lenoria Narine, PT, DPT 02/06/2014 Pager: [phone]

## 2014-02-07 ENCOUNTER — Inpatient Hospital Stay (HOSPITAL_COMMUNITY): Payer: Medicare Other

## 2014-02-07 ENCOUNTER — Encounter: Payer: Self-pay | Admitting: Internal Medicine

## 2014-02-07 DIAGNOSIS — I5033 Acute on chronic diastolic (congestive) heart failure: Secondary | ICD-10-CM

## 2014-02-07 LAB — BASIC METABOLIC PANEL
ANION GAP: 14 (ref 5–15)
BUN: 51 mg/dL — ABNORMAL HIGH (ref 6–23)
CO2: 22 mEq/L (ref 19–32)
Calcium: 8.4 mg/dL (ref 8.4–10.5)
Chloride: 106 mEq/L (ref 96–112)
Creatinine, Ser: 2.44 mg/dL — ABNORMAL HIGH (ref 0.50–1.35)
GFR calc Af Amer: 28 mL/min — ABNORMAL LOW (ref >90)
GFR, EST NON AFRICAN AMERICAN: 24 mL/min — AB (ref >90)
Glucose, Bld: 105 mg/dL — ABNORMAL HIGH (ref 70–99)
Potassium: 4 mEq/L (ref 3.7–5.3)
Sodium: 142 mEq/L (ref 137–147)

## 2014-02-07 LAB — CBC
HCT: 27.4 % — ABNORMAL LOW (ref 39.0–52.0)
Hemoglobin: 8.9 g/dL — ABNORMAL LOW (ref 13.0–17.0)
MCH: 27.6 pg (ref 26.0–34.0)
MCHC: 32.5 g/dL (ref 30.0–36.0)
MCV: 85.1 fL (ref 78.0–100.0)
Platelets: 125 10*3/uL — ABNORMAL LOW (ref 150–400)
RBC: 3.22 MIL/uL — AB (ref 4.22–5.81)
RDW: 15.3 % (ref 11.5–15.5)
WBC: 8.3 10*3/uL (ref 4.0–10.5)

## 2014-02-07 LAB — PROTIME-INR
INR: 1.6 — ABNORMAL HIGH (ref 0.00–1.49)
Prothrombin Time: 19.2 seconds — ABNORMAL HIGH (ref 11.6–15.2)

## 2014-02-07 MED ORDER — FUROSEMIDE 10 MG/ML IJ SOLN
40.0000 mg | Freq: Once | INTRAMUSCULAR | Status: AC
  Administered 2014-02-07: 40 mg via INTRAVENOUS
  Filled 2014-02-07: qty 4

## 2014-02-07 MED ORDER — CIPROFLOXACIN HCL 500 MG PO TABS
500.0000 mg | ORAL_TABLET | ORAL | Status: DC
  Administered 2014-02-07 – 2014-02-09 (×3): 500 mg via ORAL
  Filled 2014-02-07 (×4): qty 1

## 2014-02-07 MED ORDER — ENSURE COMPLETE PO LIQD
237.0000 mL | Freq: Two times a day (BID) | ORAL | Status: DC
  Administered 2014-02-07 – 2014-02-10 (×7): 237 mL via ORAL

## 2014-02-07 NOTE — Progress Notes (Signed)
[facility] PROGRESS NOTE  Juvencio Verdi ZOX:[id number] DOB: January 01, 1934 DOA: 02/02/2014 PCP: Dorrene German, MD  Brief Summary  80 yo M with hx of dementia, DM, HTN, CVA with indwelling foley cath due to BPH who presented to the ED from [facility] after developing gross hematuria after a traumatic foley insertion.  Post catheter exchange, large amounts of gross blood were expressed via cath.  CT scan demonstrated inflammatory process at teh dome of teh bladder with gas and malpositioned foley catheter.  The patient was referred to the ED, where initial hgb was 9.9 but quickly dropped to 6.8 mg/dl.  Urology was consulted and he had a coude catheter placed.  His urine has since cleared, however, he has had low uop.  He was also initially mildly hypotensive with lactate of over 4.  He became increasingly hypotensive the day of admission and was transferred to the ICU under care of critical care and was started on dopamine and levophed, antibiotics for presumptive urosepsis, and given blood transfusion.  He was transitioned off vasopressors on 11/4 and transitioned back to [facility] service on 11/5.  AKI starting to resolve.  Urine growing proteus sensitive to multiple antibiotics.    Assessment/Plan  Septic and hemorrhagic shock, now resolved.  Septic shock, unclear source but likely urinary although suggestion of infiltrates vs. Pulmonary edema on CXR, clinically improved -  Blood cultures were not obtained -  Urine culture growing proteus -  zosyn and start cipro (day 5 of antibiotics)  Acute on CKD stage III with oliguria, likely due to shock, baseline creatinine of 1.8, creatinine starting to trend down -  Continue foley  -  Trend creatinine  Increase wheezing and dyspnea today, suspect acute on chronic diastolic heart failure.  Patient is a nonsmoker. -  Repeat CXR -  Lasix 40mg  IV once pending CXR -  IS and flutter valve -  OOB  BPH with chronic foley and recent hematuria due to  traumatic foley insertion. -  Appreciate Urology assistance -  Continue at least 13F foley for 3-4 weeks   Chronic pericardial effusion (smaller 11/2 than prior study)  Atrial fibrillation on coumadin and amio, sinus bradycardia on telemetry, stable.  Tele d/c'd on 11/6 -  Continue amiodarone  -  Continue zocor 10mg    -  Coumadin held due to recent hematuria  Acute blood loss anemia superimposed on iron deficiency anemia, hemoglobin stable/improved -  Continue iron supplementation  Leukocytosis due to septic shock, resolved   Hx of DM type 2, diet controlled, A1c 5.3  Acute encephalopathy superimposed on vascular dementia, resolving  LLE left heel wound w/ exposed hardware, unstageable, stage 3 right heel ulcer, & Sacral ulcer -  Appreciate WOC and orthopedics assistance -  Plan for rod removal as outpatient  LLE swelling -  Duplex neg for DVT  Large inguinal hernia & asymptomatic cholelithiasis, outpatient follow up  Generalized weakness due to recent severe illness -  PT recommending SNF (from [facility] but would like a different facility)  Poor appetite -  Nutrition consultation -  Start supplements  Diet:  Healthy heart Access:  PIV IVF:  off Proph:  SCD  Code Status: full Family Communication: patient alone Disposition Plan:  Will likely need some diuresis given wheezing/dyspnea with close monitoring of kidney function.  Anticipate d/c to SNF early next week   Consultants:  Critical care, Dr. Koren Bound  Orthopedic surgery, Dr. Aldean Baker  Urology, Dr. Jerilee Field  Procedures: 11/2 inflammatory process at  teh dome of teh bladder with gas and malpositioned foley catheter.   11/3 CT pelvis:  diverticulum at the bladder dome which is stable, catheter is positioned well within the bladder 11/3 Right IJ CVC placed 11/6 Right IJ CVC removed  Antibiotics: Ceftriaxone, start date 11/2 > 11/2 Vancomycin, 11/3 > 11/4 Zosyn 11/3 >> 11/7 Cipro 11/7  >>  HPI/Subjective:  Feels wheezy and a little more SOB today.  Having BMs.  Denies pain, nausea.  Poor appetite  Objective: Filed Vitals:   02/06/14 0701 02/06/14 1435 02/06/14 2134 02/07/14 0525  BP: 132/56 115/64 158/59 129/67  Pulse: 63 70 74 62  Temp: 98.1 F (36.7 C) 98.1 F (36.7 C) 98.8 F (37.1 C) 98.6 F (37 C)  TempSrc: Oral Oral Oral Oral  Resp: 16 20 20 16   Height:      Weight:      SpO2: 96% 95% 94% 96%    Intake/Output Summary (Last 24 hours) at 02/07/14 0740 Last data filed at 02/07/14 [id number]  Gross per 24 hour  Intake    380 ml  Output   1300 ml  Net   -920 ml   Filed Weights   02/02/14 1726 02/04/14 0400  Weight: 73.5 kg (162 lb 0.6 oz) 81.7 kg (180 lb 1.9 oz)    Exam:   General:  WM mild accessory muscle use (SCM)  HEENT:  NCAT, MMM  Cardiovascular:  RRR, nl S1, S2 no mrg, 2+ pulses, warm extremities  Respiratory:  Diminished with full exp wheeze bilaterally anteriorly, prolonged I:E  Abdomen:   NABS, soft, NT/ND  GU:  Scrotum swollen, mildly erythematous, no rash, catheter in place  MSK:   Normal tone and bulk, 1+ LLE edema and mild injection with warmth  Neuro:  Grossly moves all extremities  Bilateral heel ulcers dressed.  Not observed today    Data Reviewed: Basic Metabolic Panel:  Recent Labs Lab 02/03/14 0015 02/04/14 0440 02/05/14 0533 02/06/14 0520 02/07/14 0523  NA 139 142 139 139 142  K 4.2 4.4 4.5 4.3 4.0  CL 106 108 107 103 106  CO2 20 20 18* 20 22  GLUCOSE 127* 89 107* 99 105*  BUN 49* 56* 60* 60* 51*  CREATININE 2.33* 2.39* 2.48* 2.54* 2.44*  CALCIUM 7.4* 7.5* 8.1* 8.5 8.4  MG  --  1.7  --   --   --   PHOS  --  3.4  --   --   --    Liver Function Tests:  Recent Labs Lab 02/02/14 1435 02/03/14 0015  AST 17 19  ALT 10 8  ALKPHOS 114 51  BILITOT 1.2 0.7  PROT 6.5 4.5*  ALBUMIN 2.3* 1.6*   No results for input(s): LIPASE, AMYLASE in the last 168 hours. No results for input(s): AMMONIA in the last 168  hours. CBC:  Recent Labs Lab 02/02/14 1435 02/03/14 0015 02/04/14 0440 02/05/14 0533 02/06/14 0520 02/07/14 0523  WBC 3.2* 13.2* 16.3* 16.3* 16.1* 8.3  NEUTROABS 2.9  --   --   --   --   --   HGB 9.9* 6.8* 8.4* 8.8* 9.3* 8.9*  HCT 31.2* 20.8* 25.8* 27.2* 28.8* 27.4*  MCV 84.8 86.3 86.3 85.8 85.0 85.1  PLT 174 92* 109* 107* 131* 125*   Cardiac Enzymes:  Recent Labs Lab 02/03/14 0330 02/03/14 1055 02/03/14 1530  TROPONINI <0.30 <0.30 <0.30   BNP (last 3 results) No results for input(s): PROBNP in the last 8760 hours. CBG:  Recent Labs  Lab 02/05/14 1250 02/05/14 1619 02/05/14 2110 02/06/14 0739 02/06/14 1138  GLUCAP 114* 131* 140* 108* 108*    Recent Results (from the past 240 hour(s))  MRSA PCR Screening     Status: None   Collection Time: 02/02/14  5:26 PM  Result Value Ref Range Status   MRSA by PCR NEGATIVE NEGATIVE Final    Comment:        The GeneXpert MRSA Assay (FDA approved for NASAL specimens only), is one component of a comprehensive MRSA colonization surveillance program. It is not intended to diagnose MRSA infection nor to guide or monitor treatment for MRSA infections.   Culture, Urine     Status: None   Collection Time: 02/02/14  6:55 PM  Result Value Ref Range Status   Specimen Description URINE, CLEAN CATCH  Final   Special Requests NONE  Final   Culture  Setup Time   Final    02/03/2014 10:04 Performed at [facility] Count   Final    >=100,000 COLONIES/ML Performed at [facility]    Culture   Final    PROTEUS MIRABILIS Note: Two isolates with different morphologies were identified as the same organism.The most resistant organism was reported. Performed at [facility]    Report Status 02/06/2014 FINAL  Final   Organism ID, Bacteria PROTEUS MIRABILIS  Final      Susceptibility   Proteus mirabilis - MIC*    AMPICILLIN >=32 RESISTANT Resistant     CEFAZOLIN <=4 SENSITIVE Sensitive      CEFTRIAXONE <=1 SENSITIVE Sensitive     CIPROFLOXACIN <=0.25 SENSITIVE Sensitive     GENTAMICIN <=1 SENSITIVE Sensitive     LEVOFLOXACIN <=0.12 SENSITIVE Sensitive     NITROFURANTOIN 128 RESISTANT Resistant     TOBRAMYCIN <=1 SENSITIVE Sensitive     TRIMETH/SULFA <=20 SENSITIVE Sensitive     PIP/TAZO <=4 SENSITIVE Sensitive     * PROTEUS MIRABILIS     Studies: No results found.  Scheduled Meds: . amiodarone  200 mg Oral Daily  . docusate sodium  100 mg Oral BID  . ferrous sulfate  325 mg Oral TID WC  . pantoprazole  40 mg Oral Daily  . piperacillin-tazobactam (ZOSYN)  IV  3.375 g Intravenous 3 times per day  . simvastatin  10 mg Oral Q lunch  . sodium chloride  3 mL Intravenous Q12H   Continuous Infusions: . sodium chloride 10 mL/hr (02/03/14 1108)    Principal Problem:   Lactic acidosis Active Problems:   Diabetes mellitus without complication   Hypertension   Anemia   Hematuria   Lactic acid acidosis   Severe sepsis with septic shock   Acute on chronic kidney failure   Acute blood loss anemia    Time spent: 30 min    Curt Oatis  [facility] Pager [phone]. If 7PM-7AM, please contact night-coverage at [email or web address], password [email or web address] 02/07/2014, 7:40 AM  LOS: 5 days

## 2014-02-07 NOTE — Progress Notes (Addendum)
ANTIBIOTIC CONSULT NOTE - FOLLOW UP  Pharmacy Consult for Zosyn  --> ciprofloxacin Indication: suspected urosepsis  No Known Allergies  Patient Measurements: Height: 5\' 6"  (167.6 cm) Weight: 180 lb 1.9 oz (81.7 kg) IBW/kg (Calculated) : 63.8  Vital Signs: Temp: 98.6 F (37 C) (11/07 0525) Temp Source: Oral (11/07 0525) BP: 129/67 mmHg (11/07 0525) Pulse Rate: 62 (11/07 0525) Intake/Output from previous day: 11/06 0701 - 11/07 0700 In: 380 [P.O.:240; IV Piggyback:140] Out: 1625 [Urine:1625] Intake/Output from this shift:    Labs:  Recent Labs  02/05/14 0533 02/06/14 0520 02/07/14 0523  WBC 16.3* 16.1* 8.3  HGB 8.8* 9.3* 8.9*  PLT 107* 131* 125*  CREATININE 2.48* 2.54* 2.44*    Assessment: 80 yo M admitted via ER from nursing home. Patient treated for UTI about 1 month ago. Patient has indwelling catheter that was changed 11/2 (traumatic procedure)--gross blood expressed via catheter.  Pharmacy consulted to dose Zosyn for suspected urosepsis.  11/1 >> Rocephin >> 11/3 11/3 >> zosyn >> 11/7 11/3 >> vanc >> 11/4 11/7 >> ciprofloxacin >>  Tmax: afebrile WBCs: WNL Renal: SCr 2.44, UOP OK, CrCl~25CG  11/2 urine: > 100k Proteus mirabilis (R-Amp/NTF, otherwise sensitive)  Goal of Therapy:  Doses adjusted per renal function Eradication of infection  Plan:  Day #1 ciprofloxacin (Day #5 antibiotics)  Ciprofloxacin 500mg  PO q24h (clinicially improved, WBC now WNL, afebrile)   Juliette Alcide, PharmD, BCPS.   Pager: [phone]  02/07/2014,8:21 AM

## 2014-02-07 NOTE — Progress Notes (Signed)
Assumed care of patient. Agree with previous RN assessment. Kaidin Boehle K, RN 

## 2014-02-07 NOTE — Progress Notes (Signed)
CSW spoke with step daughter Dossie Arbour via telephone today to discuss pt's discharge to SNF.  CSW inquired with pt's step daughter if she had made any decisions about which SNF she wanted pt to do to once he was ready for discharge some time next week.  Pt's step daughter stated that she had an ear infection and had not been able to tour facilities but was hoping that she would be able to visit them tomorrow and make a decision.  CSW discussed that pt would be ready for discharge sometime early next week per his doctor.  CSW relayed to pt's step daughter that a SNF list was left at pt's bedside for her as she stated that she would be visiting him tomorrow.  CSW will follow up with which SNF pt's step daughter chooses.  Regina Kujawa LCSW Loletta Specter, MSW, LCSW Clinical Social Work [phone]

## 2014-02-08 DIAGNOSIS — I5033 Acute on chronic diastolic (congestive) heart failure: Secondary | ICD-10-CM

## 2014-02-08 LAB — BASIC METABOLIC PANEL
Anion gap: 11 (ref 5–15)
BUN: 44 mg/dL — AB (ref 6–23)
CO2: 25 mEq/L (ref 19–32)
Calcium: 8.7 mg/dL (ref 8.4–10.5)
Chloride: 106 mEq/L (ref 96–112)
Creatinine, Ser: 2.35 mg/dL — ABNORMAL HIGH (ref 0.50–1.35)
GFR calc non Af Amer: 25 mL/min — ABNORMAL LOW (ref >90)
GFR, EST AFRICAN AMERICAN: 29 mL/min — AB (ref >90)
Glucose, Bld: 115 mg/dL — ABNORMAL HIGH (ref 70–99)
POTASSIUM: 4 meq/L (ref 3.7–5.3)
Sodium: 142 mEq/L (ref 137–147)

## 2014-02-08 LAB — CBC
HCT: 29.1 % — ABNORMAL LOW (ref 39.0–52.0)
Hemoglobin: 9.4 g/dL — ABNORMAL LOW (ref 13.0–17.0)
MCH: 27.2 pg (ref 26.0–34.0)
MCHC: 32.3 g/dL (ref 30.0–36.0)
MCV: 84.1 fL (ref 78.0–100.0)
PLATELETS: 140 10*3/uL — AB (ref 150–400)
RBC: 3.46 MIL/uL — AB (ref 4.22–5.81)
RDW: 15.2 % (ref 11.5–15.5)
WBC: 7.4 10*3/uL (ref 4.0–10.5)

## 2014-02-08 LAB — PROTIME-INR
INR: 1.75 — AB (ref 0.00–1.49)
Prothrombin Time: 20.6 seconds — ABNORMAL HIGH (ref 11.6–15.2)

## 2014-02-08 MED ORDER — SENNA 8.6 MG PO TABS
2.0000 | ORAL_TABLET | Freq: Every day | ORAL | Status: DC
  Administered 2014-02-08 – 2014-02-09 (×2): 17.2 mg via ORAL
  Filled 2014-02-08 (×2): qty 2

## 2014-02-08 MED ORDER — FUROSEMIDE 10 MG/ML IJ SOLN: 40.0000 mg | Freq: Every day | INTRAMUSCULAR | Status: DC

## 2014-02-08 MED ORDER — POLYETHYLENE GLYCOL 3350 17 G PO PACK
17.0000 g | PACK | Freq: Every day | ORAL | Status: DC | PRN
  Filled 2014-02-08: qty 1

## 2014-02-08 MED ORDER — PRO-STAT SUGAR FREE PO LIQD
30.0000 mL | Freq: Two times a day (BID) | ORAL | Status: DC
  Administered 2014-02-08 – 2014-02-10 (×4): 30 mL
  Filled 2014-02-08 (×6): qty 30

## 2014-02-08 MED ORDER — FUROSEMIDE 10 MG/ML IJ SOLN
40.0000 mg | Freq: Two times a day (BID) | INTRAMUSCULAR | Status: DC
  Administered 2014-02-08 – 2014-02-10 (×5): 40 mg via INTRAVENOUS
  Filled 2014-02-08 (×7): qty 4

## 2014-02-08 NOTE — Plan of Care (Signed)
Problem: Phase I Progression Outcomes Goal: Initial discharge plan identified Outcome: Completed/Met Date Met:  02/08/14

## 2014-02-08 NOTE — Plan of Care (Signed)
Problem: Consults Goal: Skin Care Protocol Initiated - if Braden Score 18 or less If consults are not indicated, leave blank or document N/A  Outcome: Completed/Met Date Met:  02/08/14  Problem: Phase II Progression Outcomes Goal: IV changed to normal saline lock Outcome: Completed/Met Date Met:  02/08/14

## 2014-02-08 NOTE — Progress Notes (Signed)
[facility] PROGRESS NOTE  Barry LawmanRobert White JWJ:[id number] DOB: 11/05/1933 DOA: 02/02/2014 PCP: [name],[name], [name]  Brief Summary  80 yo M with hx of dementia, DM, HTN, CVA with indwelling foley cath due to BPH who presented to the ED from [facility] after developing gross hematuria after White traumatic foley insertion.  Post catheter exchange, large amounts of gross blood were expressed via cath.  CT scan demonstrated inflammatory process at teh dome of teh bladder with gas and malpositioned foley catheter.  The patient was referred to the ED, where initial hgb was 9.9 but quickly dropped to 6.8 mg/dl.  Urology was consulted and he had White coude catheter placed.  His urine has since cleared, however, he has had low uop.  He was also initially mildly hypotensive with lactate of over 4.  He became increasingly hypotensive the day of admission and was transferred to the ICU under care of critical care and was started on dopamine and levophed, antibiotics for presumptive urosepsis, and given blood transfusion.  He was transitioned off vasopressors on 11/4 and [facility]  AKI resolving.  Urine growing proteus sensitive to multiple antibiotics so changed to ciprofloxacin.  Developed acute on chronic diastolic heart failure exacerbation from fluid resuscitation, now diuresing.    Assessment/Plan  Septic and hemorrhagic shock, now resolved.  Septic shock, unclear source but likely urinary although suggestion of infiltrates vs. Pulmonary edema on CXR, clinically improved -  Blood cultures were not obtained -  Urine culture growing proteus -  Cont cipro day 6 of 14 of antibiotics  Acute on CKD stage III with oliguria, likely due to shock, baseline creatinine of 1.8, creatinine starting to trend down -  Continue foley  -  Trend creatinine  Increase wheezing and dyspnea today, suspect acute on chronic diastolic heart failure.  Patient is White nonsmoker. -  -1.7L but creatinine  stable -  Increase to BID Lasix 40mg  IV  -  Continue to trend creatinine -  IS and flutter valve -  OOB  BPH with chronic foley and recent hematuria due to traumatic foley insertion. -  Appreciate Urology assistance -  Continue at least 67F foley for 3-4 weeks   Chronic pericardial effusion (smaller 11/2 than prior study)  Atrial fibrillation on coumadin and amio, sinus bradycardia on telemetry, stable.  Tele d/c'd on 11/6 -  Continue amiodarone  -  Continue zocor 10mg    -  Coumadin held due to recent hematuria, consider resuming in about 1 week  Acute blood loss anemia superimposed on iron deficiency anemia, hemoglobin stable/improved -  Continue iron supplementation  Leukocytosis due to septic shock, resolved   Hx of DM type 2, diet controlled, A1c 5.3  Acute encephalopathy superimposed on vascular dementia, resolving  LLE left heel wound w/ exposed hardware, unstageable, stage 3 right heel ulcer, & Sacral ulcer -  Appreciate WOC and orthopedics assistance -  Plan for rod removal as outpatient  LLE swelling -  Duplex neg for DVT  Large inguinal hernia & asymptomatic cholelithiasis, outpatient follow up  Generalized weakness due to recent severe illness -  PT recommending SNF (from [facility] but would like [facility] different facility)  Poor appetite -  Nutrition consultation -  Start supplements  Diet:  Healthy heart Access:  PIV IVF:  off Proph:  SCD  Code Status: full Family Communication: patient alone Disposition Plan:  Continue diuresis.  Anticipate d/c to SNF early next week   Consultants:  Critical care,  Dr. Koren Bound  Orthopedic surgery, Dr. Aldean Baker  Urology, Dr. Jerilee Field  Procedures: 11/2 inflammatory process at teh dome of teh bladder with gas and malpositioned foley catheter.   11/3 CT pelvis:  diverticulum at the bladder dome which is stable, catheter is positioned well within the bladder 11/3 Right IJ CVC placed 11/6 Right IJ CVC  removed  Antibiotics: Ceftriaxone, start date 11/2 > 11/2 Vancomycin, 11/3 > 11/4 Zosyn 11/3 >> 11/7 Cipro 11/7 >>  HPI/Subjective:  Still feels wheezy and somewhat SOB today.  Having BMs but states he feels constipated.  Denies pain, nausea.  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  Objective: Filed Vitals:   02/07/14 0525 02/07/14 1351 02/07/14 2128 02/08/14 0523  BP: 129/67 146/61 144/65 151/69  Pulse: 62 65 65 64  Temp: 98.6 F (37 C) 97.8 F (36.6 C) 98 F (36.7 C) 97.6 F (36.4 C)  TempSrc: Oral Oral Oral Oral  Resp: 16 18 18 19   Height:      Weight:      SpO2: 96% 97% 97% 99%    Intake/Output Summary (Last 24 hours) at 02/08/14 0805 Last data filed at 02/08/14 0524  Gross per 24 hour  Intake    530 ml  Output   2325 ml  Net  -1795 ml   Filed Weights   02/02/14 1726 02/04/14 0400  Weight: 73.5 kg (162 lb 0.6 oz) 81.7 kg (180 lb 1.9 oz)    Exam:   General:  [name], mild accessory muscle use (SCM) when talking, none at rest  HEENT:  NCAT, MMM  Cardiovascular:  RRR, nl S1, S2 no mrg, 2+ pulses, warm extremities  Respiratory:  Full exp wheeze bilaterally, no focal rales or rhonchi, prolonged I:E  Abdomen:   NABS, soft, NT/ND  GU:  Scrotum swollen, mildly erythematous, no rash, catheter in place  MSK:   Normal tone and bulk, 1+ BLE edema, anasarca  Neuro:  Grossly moves all extremities  Bilateral heel ulcers dressed.  Not observed today    Data Reviewed: Basic Metabolic Panel:  Recent Labs Lab 02/04/14 0440 02/05/14 0533 02/06/14 0520 02/07/14 0523 02/08/14 0553  NA 142 139 139 142 142  K 4.4 4.5 4.3 4.0 4.0  CL 108 107 103 106 106  CO2 20 18* 20 22 25   GLUCOSE 89 107* 99 105* 115*  BUN 56* 60* 60* 51* 44*  CREATININE 2.39* 2.48* 2.54* 2.44* 2.35*  CALCIUM 7.5* 8.1* 8.5 8.4 8.7  MG 1.7  --   --   --   --   PHOS 3.4  --   --   --   --    Liver Function Tests:  Recent Labs Lab  02/02/14 1435 02/03/14 0015  AST 17 19  ALT 10 8  ALKPHOS 114 51  BILITOT 1.2 0.7  PROT 6.5 4.5*  ALBUMIN 2.3* 1.6*   No results for input(s): LIPASE, AMYLASE in the last 168 hours. No results for input(s): AMMONIA in the last 168 hours. CBC:  Recent Labs Lab 02/02/14 1435  02/04/14 0440 02/05/14 0533 02/06/14 0520 02/07/14 0523 02/08/14 0553  WBC 3.2*  < > 16.3* 16.3* 16.1* 8.3 7.4  NEUTROABS 2.9  --   --   --   --   --   --   HGB 9.9*  < > 8.4* 8.8* 9.3* 8.9* 9.4*  HCT 31.2*  < > 25.8* 27.2* 28.8* 27.4* 29.1*  MCV 84.8  < >  86.3 85.8 85.0 85.1 84.1  PLT 174  < > 109* 107* 131* 125* 140*  < > = values in this interval not displayed. Cardiac Enzymes:  Recent Labs Lab 02/03/14 0330 02/03/14 1055 02/03/14 1530  TROPONINI <0.30 <0.30 <0.30   BNP (last 3 results) No results for input(s): PROBNP in the last 8760 hours. CBG:  Recent Labs Lab 02/05/14 1250 02/05/14 1619 02/05/14 2110 02/06/14 0739 02/06/14 1138  GLUCAP 114* 131* 140* 108* 108*    Recent Results (from the past 240 hour(s))  MRSA PCR Screening     Status: None   Collection Time: 02/02/14  5:26 PM  Result Value Ref Range Status   MRSA by PCR NEGATIVE NEGATIVE Final    Comment:        The GeneXpert MRSA Assay (FDA approved for NASAL specimens only), is one component of White comprehensive MRSA colonization surveillance program. It is not intended to diagnose MRSA infection nor to guide or monitor treatment for MRSA infections.   Culture, Urine     Status: None   Collection Time: 02/02/14  6:55 PM  Result Value Ref Range Status   Specimen Description URINE, CLEAN CATCH  Final   Special Requests NONE  Final   Culture  Setup Time   Final    02/03/2014 10:04 Performed at [facility] Count   Final    >=100,000 COLONIES/ML Performed at [facility]    Culture   Final    PROTEUS MIRABILIS Note: Two isolates with different morphologies were identified as the same  organism.The most resistant organism was reported. Performed at [facility]    Report Status 02/06/2014 FINAL  Final   Organism ID, Bacteria PROTEUS MIRABILIS  Final      Susceptibility   Proteus mirabilis - MIC*    AMPICILLIN >=32 RESISTANT Resistant     CEFAZOLIN <=4 SENSITIVE Sensitive     CEFTRIAXONE <=1 SENSITIVE Sensitive     CIPROFLOXACIN <=0.25 SENSITIVE Sensitive     GENTAMICIN <=1 SENSITIVE Sensitive     LEVOFLOXACIN <=0.12 SENSITIVE Sensitive     NITROFURANTOIN 128 RESISTANT Resistant     TOBRAMYCIN <=1 SENSITIVE Sensitive     TRIMETH/SULFA <=20 SENSITIVE Sensitive     PIP/TAZO <=4 SENSITIVE Sensitive     * PROTEUS MIRABILIS     Studies: Dg Chest Port 1 View  02/07/2014   CLINICAL DATA:  Dyspnea.  EXAM: PORTABLE CHEST - 1 VIEW  COMPARISON:  02/04/2014  FINDINGS: Moderate cardiomegaly which is stable from previous. Stable aortic contours.  Worsening diffuse interstitial opacity with perihilar airspace disease. There are layering pleural effusions bilaterally. No pneumothorax.  IMPRESSION: Worsening pulmonary edema.  Layering bilateral pleural effusion.   Electronically Signed   By: Tiburcio Pea M.D.   On: 02/07/2014 08:17    Scheduled Meds: . amiodarone  200 mg Oral Daily  . ciprofloxacin  500 mg Oral Q24H  . docusate sodium  100 mg Oral BID  . feeding supplement (ENSURE COMPLETE)  237 mL Oral BID BM  . ferrous sulfate  325 mg Oral TID WC  . furosemide  40 mg Intravenous Daily  . pantoprazole  40 mg Oral Daily  . simvastatin  10 mg Oral Q lunch  . sodium chloride  3 mL Intravenous Q12H   Continuous Infusions: . sodium chloride 10 mL/hr (02/03/14 1108)    Principal Problem:   Lactic acidosis Active Problems:   Diabetes mellitus without complication   Hypertension  Anemia   Hematuria   Lactic acid acidosis   Severe sepsis with septic shock   Acute on chronic kidney failure   Acute blood loss anemia   Acute on chronic diastolic heart  failure    Time spent: 30 min    Jahaad Penado, [facility]  [facility] Pager [phone]. If 7PM-7AM, please contact night-coverage at [email or web address], password [facility] 02/08/2014, 8:05 AM  LOS: 6 days
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
## 2014-02-08 NOTE — Plan of Care (Signed)
Problem: Phase I Progression Outcomes Goal: Hemodynamically stable Outcome: Progressing     

## 2014-02-09 LAB — BASIC METABOLIC PANEL
Anion gap: 10 (ref 5–15)
BUN: 41 mg/dL — ABNORMAL HIGH (ref 6–23)
CHLORIDE: 103 meq/L (ref 96–112)
CO2: 29 meq/L (ref 19–32)
CREATININE: 1.99 mg/dL — AB (ref 0.50–1.35)
Calcium: 8.6 mg/dL (ref 8.4–10.5)
GFR calc Af Amer: 35 mL/min — ABNORMAL LOW (ref >90)
GFR calc non Af Amer: 30 mL/min — ABNORMAL LOW (ref >90)
GLUCOSE: 123 mg/dL — AB (ref 70–99)
Potassium: 3.9 mEq/L (ref 3.7–5.3)
Sodium: 142 mEq/L (ref 137–147)

## 2014-02-09 LAB — PROTIME-INR
INR: 1.68 — ABNORMAL HIGH (ref 0.00–1.49)
Prothrombin Time: 20 seconds — ABNORMAL HIGH (ref 11.6–15.2)

## 2014-02-09 LAB — CBC
HEMATOCRIT: 28.9 % — AB (ref 39.0–52.0)
HEMOGLOBIN: 9.5 g/dL — AB (ref 13.0–17.0)
MCH: 27.5 pg (ref 26.0–34.0)
MCHC: 32.9 g/dL (ref 30.0–36.0)
MCV: 83.5 fL (ref 78.0–100.0)
Platelets: 154 10*3/uL (ref 150–400)
RBC: 3.46 MIL/uL — ABNORMAL LOW (ref 4.22–5.81)
RDW: 15 % (ref 11.5–15.5)
WBC: 7.8 10*3/uL (ref 4.0–10.5)

## 2014-02-09 NOTE — Plan of Care (Signed)
Problem: Phase III Progression Outcomes Goal: Foley discontinued Outcome: Not Applicable Date Met:  06/00/45

## 2014-02-09 NOTE — Progress Notes (Signed)
CSW spoke with patient's step-daughter, Dossie Arbour (ph#: [phone]) to confirm that they would like to go with [facility] SNF at discharge. CSW confirmed with [facility] that they would be able to take patient tomorrow. CSW anticipating discharge tomorrow.   Clinical Social Work Department CLINICAL SOCIAL WORK PLACEMENT NOTE 02/09/2014  Patient:  [name], [name]  Account Number:  [id number] Admit date:  02/02/2014  Clinical Social Worker:  Jacelyn Grip  Date/time:  02/04/2014 04:28 PM  Clinical Social Work is seeking post-discharge placement for this patient at the following level of care:   SKILLED NURSING   (*CSW will update this form in Epic as items are completed)   02/04/2014  Patient/family provided with [facility] Department of Clinical Social Work's list of facilities offering this level of care within the geographic area requested by the patient (or if unable, by the patient's family).  02/04/2014  Patient/family informed of their freedom to choose among providers that offer the needed level of care, that participate in Medicare, Medicaid or managed care program needed by the patient, have an available bed and are willing to accept the patient.  02/04/2014  Patient/family informed of [facility]' ownership interest in [facility], as well as of the fact that they are under no obligation to receive care at this facility.  PASARR submitted to EDS on 02/04/2014 PASARR number received on 02/04/2014  FL2 transmitted to all facilities in geographic area requested by pt/family on  02/04/2014 FL2 transmitted to all facilities within larger geographic area on   Patient informed that his/her managed care company has contracts with or will negotiate with  certain facilities, including the following:     Patient/family informed of bed offers received:  02/09/2014 Patient chooses bed at [facility] Physician recommends and patient  chooses bed at    Patient to be transferred to [facility] on   Patient to be transferred to facility by  Patient and family notified of transfer on  Name of family member notified:    The following physician request were entered in Epic:   Additional Comments:   Lincoln Maxin, LCSW [facility] Clinical Social Worker cell #: [phone]

## 2014-02-09 NOTE — Progress Notes (Signed)
ANTIBIOTIC CONSULT NOTE - FOLLOW UP  Pharmacy Consult for Zosyn  --> ciprofloxacin Indication: suspected urosepsis  No Known Allergies  Patient Measurements: Height: 5\' 6"  (167.6 cm) Weight: 180 lb 1.9 oz (81.7 kg) IBW/kg (Calculated) : 63.8  Vital Signs: Temp: 99 F (37.2 C) (11/09 1438) Temp Source: Oral (11/09 1438) BP: 141/57 mmHg (11/09 1438) Pulse Rate: 65 (11/09 1438) Intake/Output from previous day: 11/08 0701 - 11/09 0700 In: 360 [P.O.:360] Out: 4000 [Urine:4000] Intake/Output from this shift: Total I/O In: -  Out: 1200 [Urine:1200]  Labs:  Recent Labs  02/07/14 0523 02/08/14 0553 02/09/14 0403  WBC 8.3 7.4 7.8  HGB 8.9* 9.4* 9.5*  PLT 125* 140* 154  CREATININE 2.44* 2.35* 1.99*    Assessment: 80 yo M admitted via ER from nursing home. Patient treated for UTI about 1 month ago. Patient has indwelling catheter that was changed 11/2 (traumatic procedure)--gross blood expressed via catheter.  Pharmacy consulted to dose Zosyn for suspected urosepsis.  11/1 >> Rocephin >> 11/3 11/3 >> zosyn >> 11/7 11/3 >> vanc >> 11/4 11/7 >> ciprofloxacin >>  Tmax: 99 WBCs: WNL Renal: SCr 2.35, UOP OK, CrCl~30CG  11/2 urine: > 100k Proteus mirabilis (R-Amp/NTF, otherwise sensitive)   Goal of Therapy:  Doses adjusted per renal function Eradication of infection  Plan:  Day 3 ciprofloxacin (Day 7 antibiotics)  Continue ciprofloxacin 500mg  PO q24h with current renal function  F/u clinical course, SCr  Bernadene Person, PharmD Pager: [phone] 02/09/2014, 3:39 PM

## 2014-02-09 NOTE — Progress Notes (Signed)
[facility] PROGRESS NOTE  Martez Weiand ZOX:[id number] DOB: [birth year] DOA: 02/02/2014 PCP: Dorrene German, MD  Brief Summary  80 yo M with hx of dementia, DM, HTN, CVA with indwelling foley cath due to BPH who presented to the ED from [facility] after developing gross hematuria after a traumatic foley insertion.  Post catheter exchange, large amounts of gross blood were expressed via cath.  CT scan demonstrated inflammatory process at teh dome of teh bladder with gas and malpositioned foley catheter.  The patient was referred to the ED, where initial hgb was 9.9 but quickly dropped to 6.8 mg/dl.  Urology was consulted and he had a coude catheter placed.  His urine has since cleared, however, he has had low uop.  He was also initially mildly hypotensive with lactate of over 4.  He became increasingly hypotensive the day of admission and was transferred to the ICU under care of critical care and was started on dopamine and levophed, antibiotics for presumptive urosepsis, and given blood transfusion.  He was transitioned off vasopressors on 11/4 and transitioned back to [facility] service on 11/5.  AKI resolving.  Urine growing proteus sensitive to multiple antibiotics so changed to ciprofloxacin.  Developed acute on chronic diastolic heart failure exacerbation from fluid resuscitation, now diuresing.    Assessment/Plan  Septic and hemorrhagic shock, now resolved.  Septic shock, unclear source but likely urinary although suggestion of infiltrates vs. Pulmonary edema on CXR, clinically improved -  Blood cultures were not obtained -  Urine culture growing proteus -  Cont cipro day 7 of 14 of antibiotics  Acute on CKD stage III with oliguria, likely due to shock, baseline creatinine of 1.8, creatinine almost back to baseline -  Continue foley  -  Trend creatinine  Wheezing and dyspnea due to acute on chronic diastolic heart failure.  Patient is a nonsmoker. -  -3.64L  -  continue BID Lasix 40mg  IV   -  Continue to trend creatinine -  IS and flutter valve -  OOB  BPH with chronic foley and recent hematuria due to traumatic foley insertion. -  Appreciate Urology assistance -  Continue at least 75F foley for 3-4 weeks   Chronic pericardial effusion (smaller 11/2 than prior study)  Atrial fibrillation on coumadin and amio, sinus bradycardia on telemetry, stable.  Tele d/c'd on 11/6 -  Continue amiodarone  -  Continue zocor 10mg    -  Coumadin held due to recent hematuria, consider resuming in about 1 week  Acute blood loss anemia superimposed on iron deficiency anemia, hemoglobin stable/improved -  Continue iron supplementation  Leukocytosis due to septic shock, resolved   Hx of DM type 2, diet controlled, A1c 5.3  Acute encephalopathy superimposed on vascular dementia, resolving  LLE left heel wound w/ exposed hardware, unstageable, stage 3 right heel ulcer, & Sacral ulcer -  Appreciate WOC and orthopedics assistance -  Plan for rod removal as outpatient  LLE swelling -  Duplex neg for DVT  Large inguinal hernia & asymptomatic cholelithiasis, outpatient follow up  Generalized weakness due to recent severe illness -  PT recommending SNF (from [facility] but would like a different facility)  Poor appetite -  Nutrition consultation -  Start supplements  Diet:  Healthy heart Access:  PIV IVF:  off Proph:  SCD  Code Status: full Family Communication: patient alone Disposition Plan:  Continue diuresis.  Anticipate d/c to SNF Tuesday   Consultants:  Critical care, Dr. Koren Bound  Orthopedic surgery,  Dr. Aldean Baker  Urology, Dr. Jerilee Field  Procedures: 11/2 inflammatory process at teh dome of teh bladder with gas and malpositioned foley catheter.   11/3 CT pelvis:  diverticulum at the bladder dome which is stable, catheter is positioned well within the bladder 11/3 Right IJ CVC placed 11/6 Right IJ CVC removed  Antibiotics: Ceftriaxone, start date  11/2 > 11/2 Vancomycin, 11/3 > 11/4 Zosyn 11/3 >> 11/7 Cipro 11/7 >>  HPI/Subjective:  Still wheezy but less SOB.  Denies pain, nausea.  Needs some assistance setting up his tray  Objective: Filed Vitals:   02/08/14 0523 02/08/14 1432 02/08/14 2125 02/09/14 0526  BP: 151/69 137/70 160/67 158/58  Pulse: 64 66 72 63  Temp: 97.6 F (36.4 C) 97.9 F (36.6 C) 98.1 F (36.7 C) 98.3 F (36.8 C)  TempSrc: Oral Oral Oral Oral  Resp: 19 18 18 18   Height:      Weight:      SpO2: 99% 98% 98% 98%    Intake/Output Summary (Last 24 hours) at 02/09/14 0751 Last data filed at 02/09/14 0527  Gross per 24 hour  Intake    360 ml  Output   4000 ml  Net  -3640 ml   Filed Weights   02/02/14 1726 02/04/14 0400  Weight: 73.5 kg (162 lb 0.6 oz) 81.7 kg (180 lb 1.9 oz)    Exam:   General:  WM, NAD  HEENT:  NCAT, MMM  Cardiovascular:  RRR, nl S1, S2 no mrg, 2+ pulses, warm extremities  Respiratory:  + wheeze but improved today bilaterally, no focal rales or rhonchi, prolonged I:E  Abdomen:   NABS, soft, NT/ND  GU:  Scrotum swollen, mildly erythematous, no rash, catheter in place  MSK:   Normal tone and bulk, 1+ BLE edema, anasarca  Neuro:  Grossly moves all extremities  Bilateral heel ulcers dressed.  Not observed today    Data Reviewed: Basic Metabolic Panel:  Recent Labs Lab 02/04/14 0440 02/05/14 0533 02/06/14 0520 02/07/14 0523 02/08/14 0553 02/09/14 0403  NA 142 139 139 142 142 142  K 4.4 4.5 4.3 4.0 4.0 3.9  CL 108 107 103 106 106 103  CO2 20 18* 20 22 25 29   GLUCOSE 89 107* 99 105* 115* 123*  BUN 56* 60* 60* 51* 44* 41*  CREATININE 2.39* 2.48* 2.54* 2.44* 2.35* 1.99*  CALCIUM 7.5* 8.1* 8.5 8.4 8.7 8.6  MG 1.7  --   --   --   --   --   PHOS 3.4  --   --   --   --   --    Liver Function Tests:  Recent Labs Lab 02/02/14 1435 02/03/14 0015  AST 17 19  ALT 10 8  ALKPHOS 114 51  BILITOT 1.2 0.7  PROT 6.5 4.5*  ALBUMIN 2.3* 1.6*   No results for  input(s): LIPASE, AMYLASE in the last 168 hours. No results for input(s): AMMONIA in the last 168 hours. CBC:  Recent Labs Lab 02/02/14 1435  02/05/14 0533 02/06/14 0520 02/07/14 0523 02/08/14 0553 02/09/14 0403  WBC 3.2*  < > 16.3* 16.1* 8.3 7.4 7.8  NEUTROABS 2.9  --   --   --   --   --   --   HGB 9.9*  < > 8.8* 9.3* 8.9* 9.4* 9.5*  HCT 31.2*  < > 27.2* 28.8* 27.4* 29.1* 28.9*  MCV 84.8  < > 85.8 85.0 85.1 84.1 83.5  PLT 174  < > 107*  131* 125* 140* 154  < > = values in this interval not displayed. Cardiac Enzymes:  Recent Labs Lab 02/03/14 0330 02/03/14 1055 02/03/14 1530  TROPONINI <0.30 <0.30 <0.30   BNP (last 3 results) No results for input(s): PROBNP in the last 8760 hours. CBG:  Recent Labs Lab 02/05/14 1250 02/05/14 1619 02/05/14 2110 02/06/14 0739 02/06/14 1138  GLUCAP 114* 131* 140* 108* 108*    Recent Results (from the past 240 hour(s))  MRSA PCR Screening     Status: None   Collection Time: 02/02/14  5:26 PM  Result Value Ref Range Status   MRSA by PCR NEGATIVE NEGATIVE Final    Comment:        The GeneXpert MRSA Assay (FDA approved for NASAL specimens only), is one component of a comprehensive MRSA colonization surveillance program. It is not intended to diagnose MRSA infection nor to guide or monitor treatment for MRSA infections.   Culture, Urine     Status: None   Collection Time: 02/02/14  6:55 PM  Result Value Ref Range Status   Specimen Description URINE, CLEAN CATCH  Final   Special Requests NONE  Final   Culture  Setup Time   Final    02/03/2014 10:04 Performed at [facility] Count   Final    >=100,000 COLONIES/ML Performed at [facility]    Culture   Final    PROTEUS MIRABILIS Note: Two isolates with different morphologies were identified as the same organism.The most resistant organism was reported. Performed at [facility]    Report Status 02/06/2014 FINAL  Final   Organism ID,  Bacteria PROTEUS MIRABILIS  Final      Susceptibility   Proteus mirabilis - MIC*    AMPICILLIN >=32 RESISTANT Resistant     CEFAZOLIN <=4 SENSITIVE Sensitive     CEFTRIAXONE <=1 SENSITIVE Sensitive     CIPROFLOXACIN <=0.25 SENSITIVE Sensitive     GENTAMICIN <=1 SENSITIVE Sensitive     LEVOFLOXACIN <=0.12 SENSITIVE Sensitive     NITROFURANTOIN 128 RESISTANT Resistant     TOBRAMYCIN <=1 SENSITIVE Sensitive     TRIMETH/SULFA <=20 SENSITIVE Sensitive     PIP/TAZO <=4 SENSITIVE Sensitive     * PROTEUS MIRABILIS     Studies: Dg Chest Port 1 View  02/07/2014   CLINICAL DATA:  Dyspnea.  EXAM: PORTABLE CHEST - 1 VIEW  COMPARISON:  02/04/2014  FINDINGS: Moderate cardiomegaly which is stable from previous. Stable aortic contours.  Worsening diffuse interstitial opacity with perihilar airspace disease. There are layering pleural effusions bilaterally. No pneumothorax.  IMPRESSION: Worsening pulmonary edema.  Layering bilateral pleural effusion.   Electronically Signed   By: Tiburcio Pea M.D.   On: 02/07/2014 08:17    Scheduled Meds: . amiodarone  200 mg Oral Daily  . ciprofloxacin  500 mg Oral Q24H  . docusate sodium  100 mg Oral BID  . feeding supplement (ENSURE COMPLETE)  237 mL Oral BID BM  . feeding supplement (PRO-STAT SUGAR FREE 64)  30 mL Per Tube BID WC  . ferrous sulfate  325 mg Oral TID WC  . furosemide  40 mg Intravenous BID  . pantoprazole  40 mg Oral Daily  . senna  2 tablet Oral QHS  . simvastatin  10 mg Oral Q lunch  . sodium chloride  3 mL Intravenous Q12H   Continuous Infusions: . sodium chloride 10 mL/hr (02/03/14 1108)    Principal Problem:   Lactic acidosis Active  Problems:   Diabetes mellitus without complication   Hypertension   Anemia   Hematuria   Lactic acid acidosis   Severe sepsis with septic shock   Acute on chronic kidney failure   Acute blood loss anemia   Acute on chronic diastolic heart failure    Time spent: 30 min    Diksha Tagliaferro,  [facility] HospitalMACKENZIE  [facility] Pager [phone]. If 7PM-7AM, please contact night-coverage at [email or web address], password [email or web address] 02/09/2014, 7:51 AM  LOS: 7 days

## 2014-02-09 NOTE — Progress Notes (Signed)
Physical Therapy Treatment Patient Details Name: Barry LawmanRobert White MRN: [id number] DOB: 01/07/1934 Today's Date: 02/09/2014    History of Present Illness 80 yo M with hx of dementia, DM, HTN, CVA with indwelling foley cath due to BPH admitted [facility] after developing gross hematuria after a traumatic foley insertion and diagnosed with septic and hemorrhagic shock.    PT Comments    Pt was able to improve activity tolerance and gait distance to 30 feet x2; required one sitting rest break.  Pt did sit to stand 5x and [facility]; pt had loose stools.  Pt required min-mod A with all mobility and requires increased time to perform.    Follow Up Recommendations  SNF     Equipment Recommendations  None recommended by PT    Recommendations for Other Services       Precautions / Restrictions Precautions Precautions: Fall Restrictions Weight Bearing Restrictions: No    Mobility  Bed Mobility Overal bed mobility: Needs Assistance Bed Mobility: Supine to Sit;Sit to Supine     Supine to sit: Mod assist;HOB elevated Sit to supine: Mod assist   General bed mobility comments: VCs for sequencing; pt requires increased time to perform.  Transfers Overall transfer level: Needs assistance Equipment used: Rolling walker (2 wheeled) Transfers: Sit to/from Stand Sit to Stand: Mod assist         General transfer comment: sit to stand x5; [facility]; VCs for hand placement; pt requires increased time to perform  Ambulation/Gait Ambulation/Gait assistance: Min assist Ambulation Distance (Feet): 30 Feet (x2; pt required one sitting rest break) Assistive device: Rolling walker (2 wheeled) Gait Pattern/deviations: Shuffle;Step-through pattern;Trunk flexed Gait velocity: decreased   General Gait Details: VCs for RW management; pt requires increased time; pt fatigued after 30 feet and required sitting rest break;    Stairs            Wheelchair  Mobility    Modified Rankin (Stroke Patients Only)       Balance                                    Cognition Arousal/Alertness: Awake/alert Behavior During Therapy: WFL for tasks assessed/performed Overall Cognitive Status: Within Functional Limits for tasks assessed                      Exercises      General Comments        Pertinent Vitals/Pain      Home Living                      Prior Function            PT Goals (current goals can now be found in the care plan section) Progress towards PT goals: Progressing toward goals    Frequency  Min 3X/week    PT Plan Current plan remains appropriate    Co-evaluation             End of Session Equipment Utilized During Treatment: Gait belt Activity Tolerance: Patient tolerated treatment well Patient left: in bed;with call bell/phone within reach     Time: 1441-1524 PT Time Calculation (min): 43 min  Charges:                       G Codes:  Miller,Derrick, SPTA 02/09/2014, 3:48 PM   Reviewed above  Felecia Shelling  PTA [facility]  Acute  Rehab Pager      [phone]

## 2014-02-10 LAB — BASIC METABOLIC PANEL
ANION GAP: 11 (ref 5–15)
BUN: 36 mg/dL — ABNORMAL HIGH (ref 6–23)
CHLORIDE: 100 meq/L (ref 96–112)
CO2: 30 meq/L (ref 19–32)
Calcium: 8.5 mg/dL (ref 8.4–10.5)
Creatinine, Ser: 1.67 mg/dL — ABNORMAL HIGH (ref 0.50–1.35)
GFR calc Af Amer: 44 mL/min — ABNORMAL LOW (ref >90)
GFR calc non Af Amer: 38 mL/min — ABNORMAL LOW (ref >90)
Glucose, Bld: 117 mg/dL — ABNORMAL HIGH (ref 70–99)
Potassium: 3.1 mEq/L — ABNORMAL LOW (ref 3.7–5.3)
SODIUM: 141 meq/L (ref 137–147)

## 2014-02-10 LAB — CBC
HEMATOCRIT: 28.6 % — AB (ref 39.0–52.0)
HEMOGLOBIN: 9.4 g/dL — AB (ref 13.0–17.0)
MCH: 27.3 pg (ref 26.0–34.0)
MCHC: 32.9 g/dL (ref 30.0–36.0)
MCV: 83.1 fL (ref 78.0–100.0)
Platelets: 177 10*3/uL (ref 150–400)
RBC: 3.44 MIL/uL — AB (ref 4.22–5.81)
RDW: 15.2 % (ref 11.5–15.5)
WBC: 8.5 10*3/uL (ref 4.0–10.5)

## 2014-02-10 LAB — GLUCOSE, CAPILLARY
Glucose-Capillary: 175 mg/dL — ABNORMAL HIGH (ref 70–99)
Glucose-Capillary: 142 mg/dL — ABNORMAL HIGH (ref 70–99)

## 2014-02-10 LAB — PROTIME-INR
INR: 1.41 (ref 0.00–1.49)
Prothrombin Time: 17.4 seconds — ABNORMAL HIGH (ref 11.6–15.2)

## 2014-02-10 MED ORDER — POTASSIUM CHLORIDE CRYS ER 20 MEQ PO TBCR
40.0000 meq | EXTENDED_RELEASE_TABLET | Freq: Once | ORAL | Status: AC
  Administered 2014-02-10: 40 meq via ORAL
  Filled 2014-02-10: qty 2

## 2014-02-10 MED ORDER — CIPROFLOXACIN HCL 500 MG PO TABS: 500.0000 mg | ORAL_TABLET | ORAL | Status: DC

## 2014-02-10 MED ORDER — ENSURE COMPLETE PO LIQD: 237.0000 mL | Freq: Two times a day (BID) | ORAL | Status: AC

## 2014-02-10 NOTE — Discharge Summary (Signed)
Physician Discharge Summary  [name] WUJ:[id number] DOB: October 10, 1933 DOA: 02/02/2014  PCP: Dorrene German, MD  Admit date: 02/02/2014 Discharge date: 02/10/2014  Recommendations for Outpatient Follow-up:  1. Transfer to SNF for ongoing PT/OT 2. Continue foley catheter at discharge.  Will need to be exchanged in about 2 weeks.  3. Urology follow up with Dr. Stephanie Acre in 2 weeks 4. Continue ciprofloxacin through 11/16 then stop 5. Repeat creatinine in 1 week to check creatinine 6. Orthopedics follow up in 2 weeks with Dr. Lajoyce Corners for rod removal 7. Wound care to bilateral heels, sacral ulcers:  Cleanse with NS, pat gently dry. Apply a saline moistened gauze 2x2 to the affected areas, top with dry 4x4s and secure with Medipore tape.  Change twice daily. Keep feet in Prevalon boots per protocol and turn patient side to side (avoiding the supine position) except for meals.   Discharge Diagnoses:  Principal Problem:   Septic and hemorrhagic shock, likely secondary to pyelonephritis from CAUTI present at time of admission. Active Problems:   Diabetes mellitus without complication   Hypertension   Anemia   Hematuria   Lactic acid acidosis   Severe sepsis with septic shock   Acute on chronic kidney failure   Acute blood loss anemia   Acute on chronic diastolic heart failure   Discharge Condition: stable, improved  Diet recommendation:  finger foods.  Needs some assistance with meal set up because of blindness   Wt Readings from Last 3 Encounters:  02/04/14 81.7 kg (180 lb 1.9 oz)  12/30/13 74.39 kg (164 lb)  11/27/13 74.6 kg (164 lb 7.4 oz)    History of present illness:  80 yo M with hx of dementia, DM, HTN, CVA with indwelling foley cath due to BPH who presented to the ED from [facility] after developing gross hematuria after a traumatic foley insertion. Post catheter exchange, large amounts of gross blood were expressed via cath. CT scan demonstrated inflammatory process at the  dome of the bladder with gas and malpositioned foley catheter. The patient was referred to the ED, where initial hgb was 9.9 but quickly dropped to 6.8 mg/dl. Urology was consulted and he had a coude catheter placed. His urine has since cleared, however, he has had low uop. He was also initially mildly hypotensive with lactate of over 4. He became increasingly hypotensive the day of admission and was transferred to the ICU under care of critical care and was started on dopamine and levophed, antibiotics for presumptive urosepsis, and given blood transfusion. He was transitioned off vasopressors on 11/4 and transitioned back to [facility] service on 11/5. AKI resolving. Urine growing proteus sensitive to multiple antibiotics so changed to ciprofloxacin. Developed acute on chronic diastolic heart failure exacerbation from fluid resuscitation and was diuresed.   Hospital Course:   Septic and hemorrhagic shock, likely secondary to pyelonephritis from CAUTI present at time of admission.  Blood cultures were not obtained.  Urine culture grew proteus.  He was initially placed on broad spectrum antibiotics while in the ICU, however, after his urine culture speciated, he was transitioned to ciprofloxacin which he should continue for 14 days.  Last day should be on 11/16, then stop.  He may also have had a component of hemorrhagic shock due to hematuria which required blood transfusions.    Acute on CKD stage III with oliguria, peak creatinine of 2.54, likely due to shock, baseline creatinine of 1.8, creatinine almost back to baseline after initial fluid resuscitation and now with some  diuresis.    Wheezing and dyspnea due to acute on chronic diastolic heart failure. Patient is a nonsmoker.  He developed acute on chronic diastolic heart failure seconadry to fluid resuscitation during initial illness.  He has been diuresed with BID IV lasix 40mg  for the last several days with good clinical response.  Breathing has  improved and anasarca has also decreased.  Continue IS and flutter valve.    BPH with chronic foley and recent hematuria due to traumatic foley insertion.  Urology was consulted and he underwent coude catheter placement with irrigation.  His aspirin and coumadin were held and his hematuria has resolved.  Recommend that he have routine catheter exchanges every 3-4 weeks.  He will need new catheter exchange in about 2 weeks.  May follow up with Dr. Mena Goes in that time and first exchange can be performed in clinic.  Continue at least 17F foley for 3-4 weeks   Chronic pericardial effusion, smaller 11/2 than prior study.  Atrial fibrillation on coumadin and amio, sinus bradycardia on telemetry, stable. Continued amiodarone, zocor.  Coumadin and aspirin held due to recent hematuria.  Would recommend continuing to hold for next 1-2 weeks so that orthopedic surgery can safely remove rod from leg.  Consider resuming post-operatively if no hematuria.    Acute blood loss anemia superimposed on iron deficiency anemia, hemoglobin stable/improved.  Continue iron supplementation.    Leukocytosis due to septic shock, resolved  Hx of DM type 2, diet controlled, A1c 5.3  Acute encephalopathy superimposed on vascular dementia, resolving  LLE left heel wound w/ exposed hardware, unstageable, stage 3 right heel ulcer, & Sacral ulcer.  Appreciate WOC and orthopedics assistance.  Air mattress if available.  PRAFO boots with lambswool 24 hours per day.  Wound care to bilateral heels, sacral ulcers:  Cleanse with NS, pat gently dry. Apply a saline moistened gauze 2x2 to the affected areas, top with dry 4x4s and secure with Medipore tape.  Change twice daily. Keep feet in Prevalon boots per protocol and turn patient side to side (avoiding the supine position) except for meals.  Plan for rod removal as outpatient by orthopedics in a 1 week.    LLE swelling, Duplex neg for DVT  Large inguinal hernia & asymptomatic  cholelithiasis, outpatient follow up  Generalized weakness due to recent severe illness.  SNF for ongoing PT/OT  Poor appetite, in part because of blindness.  Recommend continuing supplements. Finger foods.    Consultants:  Critical care, Dr. Koren Bound  Orthopedic surgery, Dr. Aldean Baker  Urology, Dr. Jerilee Field  Procedures: 11/2 inflammatory process at teh dome of teh bladder with gas and malpositioned foley catheter.  11/3 CT pelvis: diverticulum at the bladder dome which is stable, catheter is positioned well within the bladder 11/3 Right IJ CVC placed 11/6 Right IJ CVC removed  Antibiotics: Ceftriaxone, start date 11/2 > 11/2 Vancomycin, 11/3 > 11/4 Zosyn 11/3 >> 11/7 Cipro 11/7 >>  Discharge Exam: Filed Vitals:   02/10/14 0550  BP: 174/59  Pulse: 64  Temp: 98.1 F (36.7 C)  Resp: 20   Filed Vitals:   02/09/14 1033 02/09/14 1438 02/09/14 2139 02/10/14 0550  BP: 158/57 141/57 157/65 174/59  Pulse: 65 65 68 64  Temp:  99 F (37.2 C) 98.3 F (36.8 C) 98.1 F (36.7 C)  TempSrc:  Oral Oral Oral  Resp: 20 18 20 20   Height:      Weight:      SpO2: 100% 99% 95% 99%  General: WM, NAD  HEENT: NCAT, MMM  Cardiovascular: RRR, nl S1, S2 no mrg, 2+ pulses, warm extremities  Respiratory: CTAB, no increased WOB  Abdomen: NABS, soft, NT/ND  GU: Scrotum swollen, mildly erythematous, no rash, catheter in place  MSK: decreased tone and bulk, particularly RLE.  Rod protrudes from left heel.  Pitting dependent edema, but markedly improved from a few days ago  Neuro: diffuse weakness, mild confusion  Bilateral heels in prevalon boots  Discharge Instructions      Discharge Instructions    (HEART FAILURE PATIENTS) Call MD:  Anytime you have any of the following symptoms: 1) 3 pound weight gain in 24 hours or 5 pounds in 1 week 2) shortness of breath, with or without a dry hacking cough 3) swelling in the hands, feet or stomach 4) if you  have to sleep on extra pillows at night in order to breathe.    Complete by:  As directed      Call MD for:  difficulty breathing, headache or visual disturbances    Complete by:  As directed      Call MD for:  extreme fatigue    Complete by:  As directed      Call MD for:  hives    Complete by:  As directed      Call MD for:  persistant dizziness or light-headedness    Complete by:  As directed      Call MD for:  persistant nausea and vomiting    Complete by:  As directed      Call MD for:  redness, tenderness, or signs of infection (pain, swelling, redness, odor or green/yellow discharge around incision site)    Complete by:  As directed      Call MD for:  severe uncontrolled pain    Complete by:  As directed      Call MD for:  temperature >100.4    Complete by:  As directed      Diet general    Complete by:  As directed      Increase activity slowly    Complete by:  As directed             Medication List    STOP taking these medications        aspirin 81 MG chewable tablet     oxyCODONE 5 MG immediate release tablet  Commonly known as:  Oxy IR/ROXICODONE     warfarin 1 MG tablet  Commonly known as:  COUMADIN      TAKE these medications        amiodarone 200 MG tablet  Commonly known as:  PACERONE  Take 200 mg by mouth daily.     amLODipine 2.5 MG tablet  Commonly known as:  NORVASC  Take 2.5 mg by mouth daily.     ciprofloxacin 500 MG tablet  Commonly known as:  CIPRO  Take 1 tablet (500 mg total) by mouth daily.     feeding supplement (ENSURE COMPLETE) Liqd  Take 237 mLs by mouth 2 (two) times daily between meals.     ferrous sulfate 325 (65 FE) MG tablet  Take 325 mg by mouth 3 (three) times daily with meals.     furosemide 20 MG tablet  Commonly known as:  LASIX  Take 20 mg by mouth.     potassium chloride SA 20 MEQ tablet  Commonly known as:  K-DUR,KLOR-CON  Take 20 mEq by mouth daily.  PROBIOTIC ACIDOPHILUS PO  Take 1 capsule by mouth 2  (two) times daily.     sennosides-docusate sodium 8.6-50 MG tablet  Commonly known as:  SENOKOT-S  Take 2 tablets by mouth 2 (two) times daily.     simvastatin 10 MG tablet  Commonly known as:  ZOCOR  Take 10 mg by mouth daily with lunch.       Follow-up Information    Follow up with Jerilee Field, MD In 2 weeks.   Specialty:  Urology   Why:  2-3 weeks; call for appointment after discharge   Contact information:   [address] [place] [address] [phone]       Follow up with DUDA,MARCUS V, MD In 1 week.   Specialty:  Orthopedic Surgery   Why:  call for appointment after discharge from hospital   Contact information:   [address] [place] [address] [place] [address] [phone]       Follow up with Dorrene German, MD.   Specialty:  Internal Medicine   Contact information:   [address] [place] [address] [phone]        The results of significant diagnostics from this hospitalization (including imaging, microbiology, ancillary and laboratory) are listed below for reference.    Significant Diagnostic Studies: Ct Abdomen Pelvis Wo Contrast  02/02/2014   CLINICAL DATA:  80 year old male with acute gross hematuria from Foley catheter. Initial encounter.  EXAM: CT ABDOMEN AND PELVIS WITHOUT CONTRAST  TECHNIQUE: Multidetector CT imaging of the abdomen and pelvis was performed following the standard protocol without IV contrast.  COMPARISON:  Chest CT 10/02/2013.  FINDINGS: Regressed pericardial effusion but increased layering bilateral pleural effusions, larger on the left. Associated compressive lung base atelectasis.  No acute osseous abnormality identified. Calcified right flank injection granulomas.  Foley catheter in place but the Foley balloon is inflated just below the pelvic floor (posterior urethra see sagittal image 60).  Hematocrit level within the bladder (series 2, image 70). Along the dome of the bladder there is an abnormal air and fluid  collection encompassing 29 x 57 x 40 mm (AP by transverse by CC) with surrounding fat stranding. This finding is also adjacent 2 but appears separate from the sigmoid colon. There are sigmoid diverticula. No active inflammation of the segment of the sigmoid colon is identified.  However, there is a mesentery and proximal sigmoid/ distal descending colon containing left inguinal hernia which continues into the left hemiscrotum and is incompletely visible with associated free fluid (series 2, image 98).  Upstream of the hernia there is no dilatation of the left colon. Diverticular again noted. Decompressed transverse colon and much of the right colon. Diverticulosis of the cecum and ascending colon. Normal appendix which courses toward the midline. Inflammation along the lower tip of the cecum probably secondary to the bladder findings described earlier. No dilated small bowel. Decompressed stomach and duodenum.  Cholelithiasis. No definite pericholecystic inflammation. Negative non contrast liver, spleen, pancreas and adrenal glands. No hydronephrosis. There is mild right hydroureter and periureteral stranding to the level of the pelvic inlet where there is inflammation surrounding the abnormal dome of the bladder described earlier. Distal to that level the distal right ureter is decompressed.  Aortoiliac calcified atherosclerosis noted. Ectasia of the abdominal aorta and proximal iliac arteries. No pelvic sidewall lymphadenopathy identified. No abdominal lymphadenopathy.  IMPRESSION: 1. Inflammatory process at the dome of the bladder where there is an extraluminal appearing 5-6 cm air in fluid collection with surrounding stranding.  Favor infected and inflamed fundal diverticulum. The gas within could be from gas-forming infection, or perhaps recent bladder catheter manipulation. No adjacent bowel loops are inflamed such that fistula is not favored. 2. Malpositioned Foley catheter, balloon inflated at the level of  the pelvic floor. 3. Large incompletely visible bowel containing left inguinal hernia, also with some free fluid. 4. Chronic and increased layering pleural effusions. Chronic decreased pericardial effusion. Study discussed by telephone with admitting Hospitalist Dr. Aldine Contes on 02/02/2014 at the time of dictation.   Electronically Signed   By: Augusto Gamble M.D.   On: 02/02/2014 17:05   Ct Pelvis Wo Contrast  02/03/2014   CLINICAL DATA:  Bladder pain. Low urine output. Scrotal edema which is new since earlier this morning. Urinary retention. Urethral trauma, status post Foley catheter.Bladder pain R39.89 (ICD-10-CM) Low urine output R34 (ICD-10-CM)  EXAM: CT PELVIS WITHOUT CONTRAST  TECHNIQUE: Multidetector CT imaging of the pelvis was performed following the standard protocol without intravenous contrast.  COMPARISON:  CT of 1 day prior  FINDINGS: Nearly 7 cm stool ball within the rectum. Mild motion degradation throughout. Incompletely imaged left inguinal hernia containing and nonobstructive sigmoid colon. Extensive colonic diverticulosis. Normal caliber of small bowel loops. No pneumatosis.  No pelvic adenopathy. A right inguinal hernia contains fat and minimal fluid. Small volume paracolic gutter fluid or interstitial thickening is slightly increased, specially on the left. Example image 1.  Urinary bladder demonstrates an irregular mildly thickened wall. Focal gas and contrast filled outpouching at the bladder dome measures 3.7 cm on image 23. Decreased since the prior exam. No surrounding inflammation. The tip of the Foley catheter is positioned within this outpouching  The bladder is contrast filled, but not well distended. The Foley balloon is now appropriately positioned. Normal prostate.  Diffuse anasarca.  No acute osseous abnormality.  IMPRESSION: 1. Mild motion degradation. 2. Bladder wall thickening and irregularity, likely related to bladder outlet obstruction. Focal outpouching of the bladder dome  contains contrast and air. Although this could represent a diverticulum, given the clinical history, localized bladder injury cannot be excluded. Of note, the Foley catheter terminates in this outpouching 3. Foley catheter in place with incomplete bladder decompression. Unless the catheter has been clamped, this would suggest suboptimal catheter function. 4. Left larger than right inguinal hernias. Left-sided hernia contains nonobstructive transverse colon. 5. Increase in anasarca and incompletely imaged fluid or interstitial thickening along the paracolic gutters. 6. Large stool ball in the rectum, suggesting constipation or fecal impaction.   Electronically Signed   By: Jeronimo Greaves M.D.   On: 02/03/2014 16:06   Dg Chest Port 1 View  02/07/2014   CLINICAL DATA:  Dyspnea.  EXAM: PORTABLE CHEST - 1 VIEW  COMPARISON:  02/04/2014  FINDINGS: Moderate cardiomegaly which is stable from previous. Stable aortic contours.  Worsening diffuse interstitial opacity with perihilar airspace disease. There are layering pleural effusions bilaterally. No pneumothorax.  IMPRESSION: Worsening pulmonary edema.  Layering bilateral pleural effusion.   Electronically Signed   By: Tiburcio Pea M.D.   On: 02/07/2014 08:17   Dg Chest Port 1 View  02/04/2014   CLINICAL DATA:  Hypoxemia.  EXAM: PORTABLE CHEST - 1 VIEW  COMPARISON:  02/03/2014  FINDINGS: Right jugular central venous catheter remains in place with tip overlying the lower SVC. Cardiac silhouette remains enlarged. Thoracic aortic calcification is again seen. There is a small left pleural effusion, which appears larger than on the prior study. Trace right pleural effusion is also  present, with fluid noted in the right minor fissure. Bilateral interstitial opacities have mildly increased from the prior study. More confluent opacities are present at the left greater than right lung bases. No pneumothorax is identified.  IMPRESSION: Mildly worsening bilateral lung opacities  with small left larger than right pleural effusions, suggestive of edema.   Electronically Signed   By: Sebastian AcheAllen  Grady   On: 02/04/2014 08:06   Dg Chest Port 1 View  02/03/2014   CLINICAL DATA:  Shock, status post central line placement  EXAM: PORTABLE CHEST - 1 VIEW  COMPARISON:  02/02/2014  FINDINGS: The cardiac shadow is stable. Patchy interstitial changes are again noted bilaterally. Poor inspiratory effort is again noted. A right central venous line is seen with the catheter tip in the distal superior vena cava. No pneumothorax is noted. No other focal abnormality is seen.  IMPRESSION: No pneumothorax following central line placement.  The chest is stable from the previous day.   Electronically Signed   By: Alcide CleverMark  Lukens M.D.   On: 02/03/2014 11:38   Dg Chest Port 1 View  02/02/2014   CLINICAL DATA:  80 year old male with acute lactic acidosis. Initial encounter.  EXAM: PORTABLE CHEST - 1 VIEW  COMPARISON:  12/30/2013 and earlier.  FINDINGS: Portable AP semi upright view at 1559 hrs. Mildly lower lung volumes. Stable cardiac size and mediastinal contours. Visualized tracheal air column is within normal limits. No pneumothorax. Increased interstitial markings diffusely. Patchy increased opacity along the right hilum and minor fissure. Trace pleural fluid in the right minor fissure. No air bronchograms. Stable visualized osseous structures.  IMPRESSION: Lower lung volumes with diffuse increased interstitial opacity, more confluent at the right hilum. Interstitial edema with pleural fluid is favored. Bilateral viral/atypical pneumonia less likely.   Electronically Signed   By: Augusto GambleLee  Hall M.D.   On: 02/02/2014 16:21    Microbiology: Recent Results (from the past 240 hour(s))  MRSA PCR Screening     Status: None   Collection Time: 02/02/14  5:26 PM  Result Value Ref Range Status   MRSA by PCR NEGATIVE NEGATIVE Final    Comment:        The GeneXpert MRSA Assay (FDA approved for NASAL specimens only), is  one component of a comprehensive MRSA colonization surveillance program. It is not intended to diagnose MRSA infection nor to guide or monitor treatment for MRSA infections.   Culture, Urine     Status: None   Collection Time: 02/02/14  6:55 PM  Result Value Ref Range Status   Specimen Description URINE, CLEAN CATCH  Final   Special Requests NONE  Final   Culture  Setup Time   Final    02/03/2014 10:04 Performed at [facility]    Colony Count   Final    >=100,000 COLONIES/ML Performed at [facility]    Culture   Final    PROTEUS MIRABILIS Note: Two isolates with different morphologies were identified as the same organism.The most resistant organism was reported. Performed at [facility]    Report Status 02/06/2014 FINAL  Final   Organism ID, Bacteria PROTEUS MIRABILIS  Final      Susceptibility   Proteus mirabilis - MIC*    AMPICILLIN >=32 RESISTANT Resistant     CEFAZOLIN <=4 SENSITIVE Sensitive     CEFTRIAXONE <=1 SENSITIVE Sensitive     CIPROFLOXACIN <=0.25 SENSITIVE Sensitive     GENTAMICIN <=1 SENSITIVE Sensitive     LEVOFLOXACIN <=0.12 SENSITIVE Sensitive  NITROFURANTOIN 128 RESISTANT Resistant     TOBRAMYCIN <=1 SENSITIVE Sensitive     TRIMETH/SULFA <=20 SENSITIVE Sensitive     PIP/TAZO <=4 SENSITIVE Sensitive     * PROTEUS MIRABILIS     Labs: Basic Metabolic Panel:  Recent Labs Lab 02/04/14 0440  02/06/14 0520 02/07/14 0523 02/08/14 0553 02/09/14 0403 02/10/14 0426  NA 142  < > 139 142 142 142 141  K 4.4  < > 4.3 4.0 4.0 3.9 3.1*  CL 108  < > 103 106 106 103 100  CO2 20  < > 20 22 25 29 30   GLUCOSE 89  < > 99 105* 115* 123* 117*  BUN 56*  < > 60* 51* 44* 41* 36*  CREATININE 2.39*  < > 2.54* 2.44* 2.35* 1.99* 1.67*  CALCIUM 7.5*  < > 8.5 8.4 8.7 8.6 8.5  MG 1.7  --   --   --   --   --   --   PHOS 3.4  --   --   --   --   --   --   < > = values in this interval not displayed. Liver Function Tests: No results for  input(s): AST, ALT, ALKPHOS, BILITOT, PROT, ALBUMIN in the last 168 hours. No results for input(s): LIPASE, AMYLASE in the last 168 hours. No results for input(s): AMMONIA in the last 168 hours. CBC:  Recent Labs Lab 02/06/14 0520 02/07/14 0523 02/08/14 0553 02/09/14 0403 02/10/14 0426  WBC 16.1* 8.3 7.4 7.8 8.5  HGB 9.3* 8.9* 9.4* 9.5* 9.4*  HCT 28.8* 27.4* 29.1* 28.9* 28.6*  MCV 85.0 85.1 84.1 83.5 83.1  PLT 131* 125* 140* 154 177   Cardiac Enzymes:  Recent Labs Lab 02/03/14 1055 02/03/14 1530  TROPONINI <0.30 <0.30   BNP: BNP (last 3 results) No results for input(s): PROBNP in the last 8760 hours. CBG:  Recent Labs Lab 02/05/14 1619 02/05/14 2110 02/06/14 0739 02/06/14 1138 02/10/14 0745  GLUCAP 131* 140* 108* 108* 175*    Time coordinating discharge: 45 minutes  Signed:  Tawan Corkern  [facility] 02/10/2014, 10:45 AM

## 2014-02-10 NOTE — Progress Notes (Signed)
Patient is set to discharge to [facility] today. Patient & step-daughter, Jamesetta So aware. Discharge packet in [facility] - RN, [place] aware. [facility] called for transport.   Clinical Social Work Department CLINICAL SOCIAL WORK PLACEMENT NOTE 02/10/2014  Patient:  [name], [name]  Account Number:  [id number] Admit date:  02/02/2014  Clinical Social Worker:  Jacelyn Grip  Date/time:  02/04/2014 04:28 PM  Clinical Social Work is seeking post-discharge placement for this patient at the following level of care:   SKILLED NURSING   (*CSW will update this form in Epic as items are completed)   02/04/2014  Patient/family provided with [facility] Department of Clinical Social Work's list of facilities offering this level of care within the geographic area requested by the patient (or if unable, by the patient's family).  02/04/2014  Patient/family informed of their freedom to choose among providers that offer the needed level of care, that participate in Medicare, Medicaid or managed care program needed by the patient, have an available bed and are willing to accept the patient.  02/04/2014  Patient/family informed of [facility]' ownership interest in [facility], as well as of the fact that they are under no obligation to receive care at this facility.  PASARR submitted to EDS on 02/04/2014 PASARR number received on 02/04/2014  FL2 transmitted to all facilities in geographic area requested by pt/family on  02/04/2014 FL2 transmitted to all facilities within larger geographic area on   Patient informed that his/her managed care company has contracts with or will negotiate with  certain facilities, including the following:     Patient/family informed of bed offers received:  02/09/2014 Patient chooses bed at [facility], [place] Physician recommends and patient chooses bed at    Patient to be transferred to [facility] on   02/10/2014 Patient to be transferred to facility by [facility] Patient and family notified of transfer on 02/10/2014 Name of family member notified:  patient's step-daughter, Jamesetta So via phone  The following physician request were entered in Epic:   Additional Comments:   Lincoln Maxin, LCSW [facility] Clinical Social Worker cell #: [phone]

## 2014-02-10 NOTE — Progress Notes (Signed)
Report called to nurse at [facility]. Hospital course reviewed and all questions answered. Julio Sicks RN

## 2014-02-10 NOTE — Plan of Care (Signed)
Problem: Phase I Progression Outcomes Goal: OOB as tolerated unless otherwise ordered Outcome: Completed/Met Date Met:  02/10/14 Goal: Voiding-avoid urinary catheter unless indicated Outcome: Not Applicable Date Met:  87/06/58 Goal: Hemodynamically stable Outcome: Completed/Met Date Met:  02/10/14

## 2014-02-11 ENCOUNTER — Non-Acute Institutional Stay (SKILLED_NURSING_FACILITY): Payer: Medicare Other | Admitting: Internal Medicine

## 2014-02-11 ENCOUNTER — Encounter: Payer: Self-pay | Admitting: Internal Medicine

## 2014-02-11 DIAGNOSIS — I5033 Acute on chronic diastolic (congestive) heart failure: Secondary | ICD-10-CM

## 2014-02-11 DIAGNOSIS — N3001 Acute cystitis with hematuria: Secondary | ICD-10-CM

## 2014-02-11 DIAGNOSIS — I739 Peripheral vascular disease, unspecified: Secondary | ICD-10-CM

## 2014-02-11 DIAGNOSIS — R312 Other microscopic hematuria: Secondary | ICD-10-CM

## 2014-02-11 DIAGNOSIS — N183 Chronic kidney disease, stage 3 unspecified: Secondary | ICD-10-CM

## 2014-02-11 DIAGNOSIS — N5089 Other specified disorders of the male genital organs: Secondary | ICD-10-CM

## 2014-02-11 DIAGNOSIS — I4891 Unspecified atrial fibrillation: Secondary | ICD-10-CM | POA: Insufficient documentation

## 2014-02-11 DIAGNOSIS — I482 Chronic atrial fibrillation, unspecified: Secondary | ICD-10-CM

## 2014-02-11 DIAGNOSIS — R3129 Other microscopic hematuria: Secondary | ICD-10-CM

## 2014-02-11 DIAGNOSIS — R5381 Other malaise: Secondary | ICD-10-CM

## 2014-02-11 DIAGNOSIS — L97529 Non-pressure chronic ulcer of other part of left foot with unspecified severity: Secondary | ICD-10-CM

## 2014-02-11 DIAGNOSIS — N508 Other specified disorders of male genital organs: Secondary | ICD-10-CM

## 2014-02-11 DIAGNOSIS — E119 Type 2 diabetes mellitus without complications: Secondary | ICD-10-CM

## 2014-02-11 DIAGNOSIS — R42 Dizziness and giddiness: Secondary | ICD-10-CM
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
## 2014-03-12 ENCOUNTER — Encounter (HOSPITAL_COMMUNITY): Payer: Self-pay | Admitting: Cardiology

## 2014-03-24 ENCOUNTER — Non-Acute Institutional Stay (SKILLED_NURSING_FACILITY): Payer: Medicare Other | Admitting: Adult Health

## 2014-03-24 DIAGNOSIS — E119 Type 2 diabetes mellitus without complications: Secondary | ICD-10-CM

## 2014-03-24 DIAGNOSIS — I5033 Acute on chronic diastolic (congestive) heart failure: Secondary | ICD-10-CM

## 2014-03-24 DIAGNOSIS — I4891 Unspecified atrial fibrillation: Secondary | ICD-10-CM

## 2014-03-24 DIAGNOSIS — E785 Hyperlipidemia, unspecified: Secondary | ICD-10-CM

## 2014-03-24 DIAGNOSIS — I10 Essential (primary) hypertension: Secondary | ICD-10-CM

## 2014-04-01 ENCOUNTER — Encounter: Payer: Self-pay | Admitting: Adult Health

## 2014-04-01 DIAGNOSIS — E785 Hyperlipidemia, unspecified: Secondary | ICD-10-CM | POA: Insufficient documentation

## 2014-04-01 MED ORDER — AMIODARONE HCL 200 MG PO TABS: 100.0000 mg | ORAL_TABLET | Freq: Every day | ORAL | Status: AC

## 2014-04-01 MED ORDER — ASPIRIN EC 81 MG PO TBEC: 81.0000 mg | DELAYED_RELEASE_TABLET | Freq: Every day | ORAL | Status: AC
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
## 2014-04-05 ENCOUNTER — Inpatient Hospital Stay (HOSPITAL_COMMUNITY): Payer: Medicare Other

## 2014-04-05 ENCOUNTER — Encounter (HOSPITAL_COMMUNITY): Payer: Self-pay | Admitting: Physical Medicine and Rehabilitation

## 2014-04-05 ENCOUNTER — Emergency Department (HOSPITAL_COMMUNITY): Payer: Medicare Other

## 2014-04-05 ENCOUNTER — Inpatient Hospital Stay (HOSPITAL_COMMUNITY)
Admission: EM | Admit: 2014-04-05 | Discharge: 2014-05-04 | DRG: 296 | Disposition: E | Payer: Medicare Other | Attending: Pulmonary Disease | Admitting: Pulmonary Disease

## 2014-04-05 DIAGNOSIS — R4020 Unspecified coma: Secondary | ICD-10-CM | POA: Diagnosis present

## 2014-04-05 DIAGNOSIS — I272 Other secondary pulmonary hypertension: Secondary | ICD-10-CM | POA: Diagnosis present

## 2014-04-05 DIAGNOSIS — N390 Urinary tract infection, site not specified: Secondary | ICD-10-CM | POA: Insufficient documentation

## 2014-04-05 DIAGNOSIS — I129 Hypertensive chronic kidney disease with stage 1 through stage 4 chronic kidney disease, or unspecified chronic kidney disease: Secondary | ICD-10-CM | POA: Diagnosis present

## 2014-04-05 DIAGNOSIS — Z66 Do not resuscitate: Secondary | ICD-10-CM | POA: Diagnosis present

## 2014-04-05 DIAGNOSIS — J9690 Respiratory failure, unspecified, unspecified whether with hypoxia or hypercapnia: Secondary | ICD-10-CM

## 2014-04-05 DIAGNOSIS — D649 Anemia, unspecified: Secondary | ICD-10-CM | POA: Diagnosis present

## 2014-04-05 DIAGNOSIS — N189 Chronic kidney disease, unspecified: Secondary | ICD-10-CM | POA: Insufficient documentation

## 2014-04-05 DIAGNOSIS — E46 Unspecified protein-calorie malnutrition: Secondary | ICD-10-CM | POA: Diagnosis present

## 2014-04-05 DIAGNOSIS — D696 Thrombocytopenia, unspecified: Secondary | ICD-10-CM | POA: Diagnosis present

## 2014-04-05 DIAGNOSIS — Z6825 Body mass index (BMI) 25.0-25.9, adult: Secondary | ICD-10-CM

## 2014-04-05 DIAGNOSIS — Z7982 Long term (current) use of aspirin: Secondary | ICD-10-CM | POA: Diagnosis not present

## 2014-04-05 DIAGNOSIS — G319 Degenerative disease of nervous system, unspecified: Secondary | ICD-10-CM | POA: Diagnosis present

## 2014-04-05 DIAGNOSIS — I469 Cardiac arrest, cause unspecified: Secondary | ICD-10-CM | POA: Diagnosis present

## 2014-04-05 DIAGNOSIS — B9620 Unspecified Escherichia coli [E. coli] as the cause of diseases classified elsewhere: Secondary | ICD-10-CM | POA: Diagnosis present

## 2014-04-05 DIAGNOSIS — J15212 Pneumonia due to Methicillin resistant Staphylococcus aureus: Secondary | ICD-10-CM | POA: Diagnosis present

## 2014-04-05 DIAGNOSIS — I429 Cardiomyopathy, unspecified: Secondary | ICD-10-CM | POA: Diagnosis present

## 2014-04-05 DIAGNOSIS — J690 Pneumonitis due to inhalation of food and vomit: Secondary | ICD-10-CM | POA: Diagnosis not present

## 2014-04-05 DIAGNOSIS — Z978 Presence of other specified devices: Secondary | ICD-10-CM

## 2014-04-05 DIAGNOSIS — G931 Anoxic brain damage, not elsewhere classified: Secondary | ICD-10-CM | POA: Diagnosis present

## 2014-04-05 DIAGNOSIS — I428 Other cardiomyopathies: Secondary | ICD-10-CM

## 2014-04-05 DIAGNOSIS — E875 Hyperkalemia: Secondary | ICD-10-CM | POA: Diagnosis present

## 2014-04-05 DIAGNOSIS — R579 Shock, unspecified: Secondary | ICD-10-CM | POA: Diagnosis present

## 2014-04-05 DIAGNOSIS — G9389 Other specified disorders of brain: Secondary | ICD-10-CM | POA: Diagnosis present

## 2014-04-05 DIAGNOSIS — E1121 Type 2 diabetes mellitus with diabetic nephropathy: Secondary | ICD-10-CM | POA: Diagnosis present

## 2014-04-05 DIAGNOSIS — E1122 Type 2 diabetes mellitus with diabetic chronic kidney disease: Secondary | ICD-10-CM | POA: Diagnosis present

## 2014-04-05 DIAGNOSIS — I739 Peripheral vascular disease, unspecified: Secondary | ICD-10-CM | POA: Diagnosis present

## 2014-04-05 DIAGNOSIS — Z8673 Personal history of transient ischemic attack (TIA), and cerebral infarction without residual deficits: Secondary | ICD-10-CM | POA: Diagnosis not present

## 2014-04-05 DIAGNOSIS — N179 Acute kidney failure, unspecified: Secondary | ICD-10-CM | POA: Diagnosis not present

## 2014-04-05 DIAGNOSIS — Z515 Encounter for palliative care: Secondary | ICD-10-CM | POA: Diagnosis not present

## 2014-04-05 DIAGNOSIS — R7989 Other specified abnormal findings of blood chemistry: Secondary | ICD-10-CM | POA: Diagnosis present

## 2014-04-05 DIAGNOSIS — Z9289 Personal history of other medical treatment: Secondary | ICD-10-CM

## 2014-04-05 DIAGNOSIS — N183 Chronic kidney disease, stage 3 (moderate): Secondary | ICD-10-CM | POA: Diagnosis present

## 2014-04-05 DIAGNOSIS — J9601 Acute respiratory failure with hypoxia: Secondary | ICD-10-CM | POA: Diagnosis not present

## 2014-04-05 DIAGNOSIS — E1165 Type 2 diabetes mellitus with hyperglycemia: Secondary | ICD-10-CM | POA: Diagnosis present

## 2014-04-05 DIAGNOSIS — J9602 Acute respiratory failure with hypercapnia: Secondary | ICD-10-CM | POA: Diagnosis not present

## 2014-04-05 DIAGNOSIS — R40243 Glasgow coma scale score 3-8: Secondary | ICD-10-CM

## 2014-04-05 DIAGNOSIS — R403 Persistent vegetative state: Secondary | ICD-10-CM | POA: Diagnosis present

## 2014-04-05 DIAGNOSIS — E43 Unspecified severe protein-calorie malnutrition: Secondary | ICD-10-CM | POA: Diagnosis present

## 2014-04-05 DIAGNOSIS — I480 Paroxysmal atrial fibrillation: Secondary | ICD-10-CM | POA: Diagnosis present

## 2014-04-05 DIAGNOSIS — I369 Nonrheumatic tricuspid valve disorder, unspecified: Secondary | ICD-10-CM | POA: Diagnosis not present

## 2014-04-05 DIAGNOSIS — R778 Other specified abnormalities of plasma proteins: Secondary | ICD-10-CM | POA: Diagnosis present

## 2014-04-05 LAB — TROPONIN I
TROPONIN I: 0.08 ng/mL — AB (ref <0.031)
Troponin I: 0.09 ng/mL — ABNORMAL HIGH (ref <0.031)
Troponin I: 0.12 ng/mL — ABNORMAL HIGH (ref <0.031)

## 2014-04-05 LAB — POCT I-STAT 3, ART BLOOD GAS (G3+)
ACID-BASE DEFICIT: 2 mmol/L (ref 0.0–2.0)
Bicarbonate: 23 mEq/L (ref 20.0–24.0)
O2 SAT: 100 %
PH ART: 7.456 — AB (ref 7.350–7.450)
Patient temperature: 32.6
TCO2: 24 mmol/L (ref 0–100)
pCO2 arterial: 31.2 mmHg — ABNORMAL LOW (ref 35.0–45.0)
pO2, Arterial: 308 mmHg — ABNORMAL HIGH (ref 80.0–100.0)

## 2014-04-05 LAB — I-STAT CHEM 8, ED
BUN: 62 mg/dL — AB (ref 6–23)
CALCIUM ION: 1.15 mmol/L (ref 1.13–1.30)
CREATININE: 2.3 mg/dL — AB (ref 0.50–1.35)
Chloride: 104 mEq/L (ref 96–112)
GLUCOSE: 203 mg/dL — AB (ref 70–99)
HCT: 28 % — ABNORMAL LOW (ref 39.0–52.0)
Hemoglobin: 9.5 g/dL — ABNORMAL LOW (ref 13.0–17.0)
Potassium: 5.7 mmol/L — ABNORMAL HIGH (ref 3.5–5.1)
Sodium: 136 mmol/L (ref 135–145)
TCO2: 18 mmol/L (ref 0–100)

## 2014-04-05 LAB — CBC WITH DIFFERENTIAL/PLATELET
Basophils Absolute: 0 10*3/uL (ref 0.0–0.1)
Basophils Relative: 0 % (ref 0–1)
EOS ABS: 0 10*3/uL (ref 0.0–0.7)
EOS PCT: 0 % (ref 0–5)
HEMATOCRIT: 27.1 % — AB (ref 39.0–52.0)
HEMOGLOBIN: 8.1 g/dL — AB (ref 13.0–17.0)
Lymphocytes Relative: 10 % — ABNORMAL LOW (ref 12–46)
Lymphs Abs: 1 10*3/uL (ref 0.7–4.0)
MCH: 26.5 pg (ref 26.0–34.0)
MCHC: 29.9 g/dL — ABNORMAL LOW (ref 30.0–36.0)
MCV: 88.6 fL (ref 78.0–100.0)
MONO ABS: 0.4 10*3/uL (ref 0.1–1.0)
MONOS PCT: 4 % (ref 3–12)
Neutro Abs: 8.6 10*3/uL — ABNORMAL HIGH (ref 1.7–7.7)
Neutrophils Relative %: 86 % — ABNORMAL HIGH (ref 43–77)
Platelets: 162 10*3/uL (ref 150–400)
RBC: 3.06 MIL/uL — AB (ref 4.22–5.81)
RDW: 18.1 % — ABNORMAL HIGH (ref 11.5–15.5)
WBC: 10 10*3/uL (ref 4.0–10.5)

## 2014-04-05 LAB — POCT I-STAT 3, VENOUS BLOOD GAS (G3P V)
Acid-base deficit: 5 mmol/L — ABNORMAL HIGH (ref 0.0–2.0)
BICARBONATE: 21.7 meq/L (ref 20.0–24.0)
O2 SAT: 81 %
PCO2 VEN: 37.2 mmHg — AB (ref 45.0–50.0)
Patient temperature: 32.9
TCO2: 23 mmol/L (ref 0–100)
pH, Ven: 7.354 — ABNORMAL HIGH (ref 7.250–7.300)
pO2, Ven: 37 mmHg (ref 30.0–45.0)

## 2014-04-05 LAB — POCT I-STAT, CHEM 8
BUN: 51 mg/dL — AB (ref 6–23)
BUN: 48 mg/dL — ABNORMAL HIGH (ref 6–23)
BUN: 48 mg/dL — ABNORMAL HIGH (ref 6–23)
BUN: 49 mg/dL — ABNORMAL HIGH (ref 6–23)
BUN: 50 mg/dL — AB (ref 6–23)
CALCIUM ION: 1.18 mmol/L (ref 1.13–1.30)
CALCIUM ION: 1.22 mmol/L (ref 1.13–1.30)
CHLORIDE: 107 meq/L (ref 96–112)
CHLORIDE: 105 meq/L (ref 96–112)
CREATININE: 2 mg/dL — AB (ref 0.50–1.35)
Calcium, Ion: 1.17 mmol/L (ref 1.13–1.30)
Calcium, Ion: 1.18 mmol/L (ref 1.13–1.30)
Calcium, Ion: 1.21 mmol/L (ref 1.13–1.30)
Chloride: 105 mEq/L (ref 96–112)
Chloride: 107 mEq/L (ref 96–112)
Chloride: 105 mEq/L (ref 96–112)
Creatinine, Ser: 2 mg/dL — ABNORMAL HIGH (ref 0.50–1.35)
Creatinine, Ser: 2.1 mg/dL — ABNORMAL HIGH (ref 0.50–1.35)
Creatinine, Ser: 2.1 mg/dL — ABNORMAL HIGH (ref 0.50–1.35)
Creatinine, Ser: 2 mg/dL — ABNORMAL HIGH (ref 0.50–1.35)
GLUCOSE: 180 mg/dL — AB (ref 70–99)
GLUCOSE: 195 mg/dL — AB (ref 70–99)
GLUCOSE: 182 mg/dL — AB (ref 70–99)
Glucose, Bld: 189 mg/dL — ABNORMAL HIGH (ref 70–99)
Glucose, Bld: 178 mg/dL — ABNORMAL HIGH (ref 70–99)
HCT: 31 % — ABNORMAL LOW (ref 39.0–52.0)
HCT: 24 % — ABNORMAL LOW (ref 39.0–52.0)
HCT: 25 % — ABNORMAL LOW (ref 39.0–52.0)
HCT: 25 % — ABNORMAL LOW (ref 39.0–52.0)
HEMATOCRIT: 26 % — AB (ref 39.0–52.0)
HEMOGLOBIN: 10.5 g/dL — AB (ref 13.0–17.0)
HEMOGLOBIN: 8.2 g/dL — AB (ref 13.0–17.0)
HEMOGLOBIN: 8.5 g/dL — AB (ref 13.0–17.0)
HEMOGLOBIN: 8.5 g/dL — AB (ref 13.0–17.0)
Hemoglobin: 8.8 g/dL — ABNORMAL LOW (ref 13.0–17.0)
POTASSIUM: 4.2 mmol/L (ref 3.5–5.1)
POTASSIUM: 4.3 mmol/L (ref 3.5–5.1)
Potassium: 4.7 mmol/L (ref 3.5–5.1)
Potassium: 4.5 mmol/L (ref 3.5–5.1)
Potassium: 4.4 mmol/L (ref 3.5–5.1)
SODIUM: 140 mmol/L (ref 135–145)
SODIUM: 140 mmol/L (ref 135–145)
Sodium: 138 mmol/L (ref 135–145)
Sodium: 139 mmol/L (ref 135–145)
Sodium: 140 mmol/L (ref 135–145)
TCO2: 18 mmol/L (ref 0–100)
TCO2: 20 mmol/L (ref 0–100)
TCO2: 21 mmol/L (ref 0–100)
TCO2: 19 mmol/L (ref 0–100)
TCO2: 20 mmol/L (ref 0–100)

## 2014-04-05 LAB — BASIC METABOLIC PANEL
ANION GAP: 10 (ref 5–15)
Anion gap: 14 (ref 5–15)
Anion gap: 8 (ref 5–15)
BUN: 62 mg/dL — AB (ref 6–23)
BUN: 57 mg/dL — AB (ref 6–23)
BUN: 57 mg/dL — ABNORMAL HIGH (ref 6–23)
CALCIUM: 8.3 mg/dL — AB (ref 8.4–10.5)
CALCIUM: 7.5 mg/dL — AB (ref 8.4–10.5)
CALCIUM: 7.6 mg/dL — AB (ref 8.4–10.5)
CHLORIDE: 107 meq/L (ref 96–112)
CO2: 20 mmol/L (ref 19–32)
CO2: 21 mmol/L (ref 19–32)
CO2: 20 mmol/L (ref 19–32)
CREATININE: 2.36 mg/dL — AB (ref 0.50–1.35)
Chloride: 103 mEq/L (ref 96–112)
Chloride: 110 mEq/L (ref 96–112)
Creatinine, Ser: 2.65 mg/dL — ABNORMAL HIGH (ref 0.50–1.35)
Creatinine, Ser: 2.23 mg/dL — ABNORMAL HIGH (ref 0.50–1.35)
GFR calc Af Amer: 25 mL/min — ABNORMAL LOW (ref >90)
GFR calc Af Amer: 29 mL/min — ABNORMAL LOW (ref >90)
GFR calc Af Amer: 31 mL/min — ABNORMAL LOW (ref >90)
GFR calc non Af Amer: 25 mL/min — ABNORMAL LOW (ref >90)
GFR calc non Af Amer: 27 mL/min — ABNORMAL LOW (ref >90)
GFR, EST NON AFRICAN AMERICAN: 22 mL/min — AB (ref >90)
GLUCOSE: 204 mg/dL — AB (ref 70–99)
Glucose, Bld: 160 mg/dL — ABNORMAL HIGH (ref 70–99)
Glucose, Bld: 181 mg/dL — ABNORMAL HIGH (ref 70–99)
Potassium: 5.9 mmol/L — ABNORMAL HIGH (ref 3.5–5.1)
Potassium: 5.5 mmol/L — ABNORMAL HIGH (ref 3.5–5.1)
Potassium: 4.8 mmol/L (ref 3.5–5.1)
Sodium: 137 mmol/L (ref 135–145)
Sodium: 139 mmol/L (ref 135–145)
Sodium: 137 mmol/L (ref 135–145)

## 2014-04-05 LAB — PROTIME-INR
INR: 1.88 — ABNORMAL HIGH (ref 0.00–1.49)
INR: 1.69 — ABNORMAL HIGH (ref 0.00–1.49)
Prothrombin Time: 21.8 seconds — ABNORMAL HIGH (ref 11.6–15.2)
Prothrombin Time: 20.1 seconds — ABNORMAL HIGH (ref 11.6–15.2)

## 2014-04-05 LAB — I-STAT ARTERIAL BLOOD GAS, ED
ACID-BASE DEFICIT: 8 mmol/L — AB (ref 0.0–2.0)
Bicarbonate: 17.9 mEq/L — ABNORMAL LOW (ref 20.0–24.0)
O2 SAT: 96 %
PCO2 ART: 37.8 mmHg (ref 35.0–45.0)
PH ART: 7.284 — AB (ref 7.350–7.450)
TCO2: 19 mmol/L (ref 0–100)
pO2, Arterial: 92 mmHg (ref 80.0–100.0)

## 2014-04-05 LAB — GLUCOSE, CAPILLARY
GLUCOSE-CAPILLARY: 187 mg/dL — AB (ref 70–99)
GLUCOSE-CAPILLARY: 178 mg/dL — AB (ref 70–99)
GLUCOSE-CAPILLARY: 177 mg/dL — AB (ref 70–99)
GLUCOSE-CAPILLARY: 163 mg/dL — AB (ref 70–99)

## 2014-04-05 LAB — CBC
HEMATOCRIT: 25.2 % — AB (ref 39.0–52.0)
Hemoglobin: 7.5 g/dL — ABNORMAL LOW (ref 13.0–17.0)
MCH: 25.4 pg — ABNORMAL LOW (ref 26.0–34.0)
MCHC: 29.8 g/dL — ABNORMAL LOW (ref 30.0–36.0)
MCV: 85.4 fL (ref 78.0–100.0)
Platelets: 123 10*3/uL — ABNORMAL LOW (ref 150–400)
RBC: 2.95 MIL/uL — AB (ref 4.22–5.81)
RDW: 18 % — AB (ref 11.5–15.5)
WBC: 9 10*3/uL (ref 4.0–10.5)

## 2014-04-05 LAB — I-STAT TROPONIN, ED: Troponin i, poc: 0.06 ng/mL (ref 0.00–0.08)

## 2014-04-05 LAB — APTT
APTT: 39 s — AB (ref 24–37)
aPTT: 39 seconds — ABNORMAL HIGH (ref 24–37)

## 2014-04-05 LAB — BRAIN NATRIURETIC PEPTIDE: B NATRIURETIC PEPTIDE 5: 947.6 pg/mL — AB (ref 0.0–100.0)

## 2014-04-05 LAB — MRSA PCR SCREENING: MRSA by PCR: POSITIVE — AB

## 2014-04-05 LAB — STREP PNEUMONIAE URINARY ANTIGEN: Strep Pneumo Urinary Antigen: NEGATIVE

## 2014-04-05 LAB — I-STAT CG4 LACTIC ACID, ED: LACTIC ACID, VENOUS: 7.69 mmol/L — AB (ref 0.5–2.2)

## 2014-04-05 MED ORDER — SODIUM CHLORIDE 0.9 % IV SOLN: 2000.0000 mL | Freq: Once | INTRAVENOUS | Status: AC

## 2014-04-05 MED ORDER — NOREPINEPHRINE BITARTRATE 1 MG/ML IV SOLN
0.0000 ug/min | INTRAVENOUS | Status: DC
  Administered 2014-04-05: 5 ug/min via INTRAVENOUS
  Administered 2014-04-06: 1 ug/min via INTRAVENOUS
  Administered 2014-04-06: 0.5 ug/min via INTRAVENOUS
  Administered 2014-04-06: 1 ug/min via INTRAVENOUS
  Filled 2014-04-05 (×2): qty 4

## 2014-04-05 MED ORDER — ARTIFICIAL TEARS OP OINT
1.0000 "application " | TOPICAL_OINTMENT | Freq: Three times a day (TID) | OPHTHALMIC | Status: DC
  Administered 2014-04-05 – 2014-04-07 (×6): 1 via OPHTHALMIC
  Filled 2014-04-05: qty 3.5

## 2014-04-05 MED ORDER — AMPICILLIN-SULBACTAM SODIUM 1.5 (1-0.5) G IJ SOLR
1.5000 g | Freq: Two times a day (BID) | INTRAMUSCULAR | Status: DC
  Administered 2014-04-05 – 2014-04-07 (×6): 1.5 g via INTRAVENOUS
  Filled 2014-04-05 (×7): qty 1.5

## 2014-04-05 MED ORDER — SUCCINYLCHOLINE CHLORIDE 20 MG/ML IJ SOLN
INTRAMUSCULAR | Status: AC
  Filled 2014-04-05: qty 1

## 2014-04-05 MED ORDER — METHYLPREDNISOLONE SODIUM SUCC 125 MG IJ SOLR
80.0000 mg | Freq: Three times a day (TID) | INTRAMUSCULAR | Status: DC
  Administered 2014-04-05 – 2014-04-06 (×3): 80 mg via INTRAVENOUS
  Filled 2014-04-05: qty 2
  Filled 2014-04-05: qty 1.28
  Filled 2014-04-05 (×2): qty 2
  Filled 2014-04-05 (×2): qty 1.28

## 2014-04-05 MED ORDER — NOREPINEPHRINE BITARTRATE 1 MG/ML IV SOLN
0.0000 ug/min | INTRAVENOUS | Status: DC
  Administered 2014-04-05: 5 ug/min via INTRAVENOUS
  Filled 2014-04-05: qty 4

## 2014-04-05 MED ORDER — MIDAZOLAM HCL 5 MG/ML IJ SOLN
1.0000 mg/h | INTRAMUSCULAR | Status: DC
  Administered 2014-04-05 (×2): 1 mg/h via INTRAVENOUS
  Administered 2014-04-06 (×2): 4 mg/h via INTRAVENOUS
  Filled 2014-04-05 (×4): qty 10

## 2014-04-05 MED ORDER — LIDOCAINE HCL (CARDIAC) 20 MG/ML IV SOLN
INTRAVENOUS | Status: AC
  Filled 2014-04-05: qty 5

## 2014-04-05 MED ORDER — ATROPINE SULFATE 0.1 MG/ML IJ SOLN
INTRAMUSCULAR | Status: AC
  Filled 2014-04-05: qty 10

## 2014-04-05 MED ORDER — SODIUM CHLORIDE 0.9 % IV SOLN
2000.0000 mL | Freq: Once | INTRAVENOUS | Status: AC
  Administered 2014-04-05: 2000 mL via INTRAVENOUS

## 2014-04-05 MED ORDER — CETYLPYRIDINIUM CHLORIDE 0.05 % MT LIQD
7.0000 mL | Freq: Four times a day (QID) | OROMUCOSAL | Status: DC
  Administered 2014-04-05 – 2014-04-14 (×37): 7 mL via OROMUCOSAL

## 2014-04-05 MED ORDER — NOREPINEPHRINE BITARTRATE 1 MG/ML IV SOLN: 0.0000 ug/min | Freq: Once | INTRAVENOUS | Status: DC

## 2014-04-05 MED ORDER — CHLORHEXIDINE GLUCONATE 0.12 % MT SOLN
15.0000 mL | Freq: Two times a day (BID) | OROMUCOSAL | Status: DC
  Administered 2014-04-05 – 2014-04-14 (×20): 15 mL via OROMUCOSAL
  Filled 2014-04-05 (×20): qty 15

## 2014-04-05 MED ORDER — MIDAZOLAM HCL 2 MG/2ML IJ SOLN
5.0000 mg | Freq: Once | INTRAMUSCULAR | Status: AC
  Administered 2014-04-05: 2 mg via INTRAVENOUS

## 2014-04-05 MED ORDER — ASPIRIN 300 MG RE SUPP
300.0000 mg | RECTAL | Status: AC
  Administered 2014-04-05: 300 mg via RECTAL

## 2014-04-05 MED ORDER — CHLORHEXIDINE GLUCONATE CLOTH 2 % EX PADS
6.0000 | MEDICATED_PAD | Freq: Every day | CUTANEOUS | Status: AC
  Administered 2014-04-06 – 2014-04-10 (×6): 6 via TOPICAL

## 2014-04-05 MED ORDER — MUPIROCIN 2 % EX OINT
1.0000 "application " | TOPICAL_OINTMENT | Freq: Two times a day (BID) | CUTANEOUS | Status: AC
  Administered 2014-04-05 – 2014-04-10 (×10): 1 via NASAL
  Filled 2014-04-05: qty 22

## 2014-04-05 MED ORDER — SODIUM CHLORIDE 0.9 % IV BOLUS (SEPSIS)
1000.0000 mL | Freq: Once | INTRAVENOUS | Status: AC
  Administered 2014-04-05: 1000 mL via INTRAVENOUS

## 2014-04-05 MED ORDER — SODIUM CHLORIDE 0.9 % IV SOLN
1.0000 ug/kg/min | INTRAVENOUS | Status: DC
  Administered 2014-04-05 – 2014-04-06 (×2): 1.5 ug/kg/min via INTRAVENOUS
  Filled 2014-04-05 (×2): qty 20

## 2014-04-05 MED ORDER — SUCCINYLCHOLINE CHLORIDE 20 MG/ML IJ SOLN
100.0000 mg | Freq: Once | INTRAMUSCULAR | Status: AC
  Administered 2014-04-05: 100 mg via INTRAVENOUS
  Filled 2014-04-05: qty 5

## 2014-04-05 MED ORDER — ALBUTEROL SULFATE (2.5 MG/3ML) 0.083% IN NEBU: 2.5000 mg | INHALATION_SOLUTION | RESPIRATORY_TRACT | Status: DC | PRN

## 2014-04-05 MED ORDER — CISATRACURIUM BESYLATE 10 MG/ML IV SOLN
1.0000 ug/kg/min | INTRAVENOUS | Status: DC
  Administered 2014-04-05: 1 ug/kg/min via INTRAVENOUS
  Filled 2014-04-05: qty 20

## 2014-04-05 MED ORDER — IPRATROPIUM-ALBUTEROL 0.5-2.5 (3) MG/3ML IN SOLN
3.0000 mL | Freq: Four times a day (QID) | RESPIRATORY_TRACT | Status: DC
  Administered 2014-04-05 – 2014-04-13 (×32): 3 mL via RESPIRATORY_TRACT
  Filled 2014-04-05 (×31): qty 3

## 2014-04-05 MED ORDER — MIDAZOLAM HCL 2 MG/2ML IJ SOLN
INTRAMUSCULAR | Status: AC
  Filled 2014-04-05: qty 2

## 2014-04-05 MED ORDER — ATROPINE SULFATE 1 MG/ML IJ SOLN
1.0000 mg | Freq: Once | INTRAMUSCULAR | Status: AC
  Administered 2014-04-05: 1 mg via INTRAVENOUS

## 2014-04-05 MED ORDER — CISATRACURIUM BOLUS VIA INFUSION
0.1000 mg/kg | Freq: Once | INTRAVENOUS | Status: DC
  Filled 2014-04-05: qty 7

## 2014-04-05 MED ORDER — ETOMIDATE 2 MG/ML IV SOLN
INTRAVENOUS | Status: AC
  Filled 2014-04-05: qty 20

## 2014-04-05 MED ORDER — ROCURONIUM BROMIDE 50 MG/5ML IV SOLN
INTRAVENOUS | Status: AC
  Filled 2014-04-05: qty 2

## 2014-04-05 MED ORDER — INSULIN ASPART 100 UNIT/ML ~~LOC~~ SOLN
1.0000 [IU] | SUBCUTANEOUS | Status: DC
  Administered 2014-04-05 – 2014-04-06 (×5): 2 [IU] via SUBCUTANEOUS
  Administered 2014-04-06: 1 [IU] via SUBCUTANEOUS

## 2014-04-05 MED ORDER — HEPARIN SODIUM (PORCINE) 5000 UNIT/ML IJ SOLN
5000.0000 [IU] | Freq: Three times a day (TID) | INTRAMUSCULAR | Status: DC
  Administered 2014-04-05 – 2014-04-08 (×9): 5000 [IU] via SUBCUTANEOUS
  Filled 2014-04-05 (×12): qty 1

## 2014-04-05 MED ORDER — PANTOPRAZOLE SODIUM 40 MG IV SOLR
40.0000 mg | Freq: Every day | INTRAVENOUS | Status: DC
  Administered 2014-04-05: 40 mg via INTRAVENOUS
  Filled 2014-04-05 (×2): qty 40

## 2014-04-05 MED ORDER — CISATRACURIUM BOLUS VIA INFUSION
0.0500 mg/kg | INTRAVENOUS | Status: AC | PRN
  Administered 2014-04-05: 3.5 mg via INTRAVENOUS
  Filled 2014-04-05: qty 4

## 2014-04-05 MED ORDER — SODIUM CHLORIDE 0.9 % IV SOLN
25.0000 ug/h | INTRAVENOUS | Status: DC
  Administered 2014-04-05 (×2): 50 ug/h via INTRAVENOUS
  Administered 2014-04-06: 150 ug/h via INTRAVENOUS
  Filled 2014-04-05 (×3): qty 50

## 2014-04-05 NOTE — ED Notes (Signed)
POC labs reported to Dr.James at bedside. ED-Lab.
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
## 2014-04-05 NOTE — Progress Notes (Signed)
EKG CRITICAL VALUE     12 lead EKG performed.  Critical value noted.  Garnette Czech, RN notified.   Evangeline Dakin C, CCT 20-Apr-2014 4:38 PM

## 2014-04-05 NOTE — Procedures (Signed)
Arterial Catheter Insertion Procedure Note [name] [id number] Oct 02, 1933  Procedure: Insertion of Arterial Catheter  Indications: Frequent blood sampling  Procedure Details Consent: Unable to obtain consent because of emergent medical necessity. Time Out: Verified patient identification, verified procedure, site/side was marked, verified correct patient position, special equipment/implants available, medications/allergies/relevent history reviewed, required imaging and test results available.  Performed  Maximum sterile technique was used including antiseptics, cap, gloves, gown, hand hygiene, mask and sheet. Skin prep: Chlorhexidine; local anesthetic administered 20 gauge catheter was inserted into right radial artery using the Seldinger technique.  Evaluation Blood flow good; BP tracing good. Complications: No apparent complications.   [name], Rudy Jew 04/11/2014
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
## 2014-04-05 NOTE — Progress Notes (Signed)
ANTIBIOTIC CONSULT NOTE - INITIAL  Pharmacy Consult for unasyn Indication: pneumonia  No Known Allergies  Patient Measurements: Height: 5\' 8"  (172.7 cm) IBW/kg (Calculated) : 68.4   Vital Signs: BP: 155/80 mmHg (01/03 1130) Pulse Rate: 84 (01/03 1130) Intake/Output from previous day:   Intake/Output from this shift: Total I/O In: 1000 [I.V.:1000] Out: 50 [Urine:50]  Labs:  Recent Labs  2014-04-06 0930 04/06/14 0934  WBC 10.0  --   HGB 8.1* 9.5*  PLT 162  --   CREATININE 2.65* 2.30*   Estimated Creatinine Clearance: 25.6 mL/min (by C-G formula based on Cr of 2.3). No results for input(s): VANCOTROUGH, VANCOPEAK, VANCORANDOM, GENTTROUGH, GENTPEAK, GENTRANDOM, TOBRATROUGH, TOBRAPEAK, TOBRARND, AMIKACINPEAK, AMIKACINTROU, AMIKACIN in the last 72 hours.   Microbiology: No results found for this or any previous visit (from the past 720 hour(s)).  Medical History: Past Medical History  Diagnosis Date  . Diabetes mellitus without complication   . Hypertension   . CKD (chronic kidney disease) stage 3, GFR 30-59 ml/min 08/01/2012  . Hypoglycemia 07/26/2012  . Anemia 08/01/2012  . CVA (cerebral vascular accident)   . Pericardial effusion     750 cc drained 11/03/2013--window 11/07/13  . Peripheral arterial disease     status post left SFA stenting by myself for critical limb ischemia 02/10/10    Medications:  See medication history Assessment: 81 yo man to start unasyn for aspiration pneumonia.  He had witnessed cardiac arrrest and will be started on artic sun protocol.  CrCl ~25 ml/min  Goal of Therapy:  Prevention/eradication of infection  Plan:  Unasyn 1.5 gm IV q12 hours F/u clinical course  Makel Mcmann Poteet 04/06/2014,11:40 AM

## 2014-04-05 NOTE — ED Notes (Signed)
Artic Sun Protocol per CCM. Pharmacy notified.
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
## 2014-04-05 NOTE — ED Notes (Signed)
Unable to establish peripheral IV access, attempted by 2 RN's. Dr. Fayrene Fearing notified.
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
## 2014-04-05 NOTE — Progress Notes (Signed)
Advanced ETT 1CM per md order

## 2014-04-05 NOTE — H&P (Signed)
PULMONARY / CRITICAL CARE MEDICINE   Name: [name] MRN: [id number] DOB: 11-17-1933    ADMISSION DATE:  04/26/14  REFERRING MD :  EDP  CHIEF COMPLAINT:  Cardiac arrest   INITIAL PRESENTATION: 81 yo male SNF pt w/ witnessed cardiac arrest with immediate CPR , initial PEA noted , 25 min CPR w/ 3 epi prior to ER, ROS w/ junctional rhythm . PCCM to admit.   STUDIES:    SIGNIFICANT EVENTS: Artic Sun 1/3 10:52 am    HISTORY OF PRESENT ILLNESS:  81 yo male with hx of atrial fib and HTN with witnessed arrest while eating breakfast at SNF this am with immediate CPR, AED with no shockable rhythm, PEA noted. Total CPR  3 epi per EMS. [place] in field. ROS to junctional rhythm, required atropine in ER for bradycardia. EDP with CVL placed. PCCM asked to admit. Pt started on Artic sun . At 1027 am. Cardiology paged and aware of pt. Pt is unresponsive on vent .  No family at bedside    PAST MEDICAL HISTORY :   has a past medical history of Diabetes mellitus without complication; Hypertension; CKD (chronic kidney disease) stage 3, GFR 30-59 ml/min (08/01/2012); Hypoglycemia (07/26/2012); Anemia (08/01/2012); CVA (cerebral vascular accident); Pericardial effusion; and Peripheral arterial disease.  has past surgical history that includes Leg Surgery; Foot surgery; Subxyphoid pericardial window (N/A, 11/07/2013); and pericardial tap (N/A, 11/03/2013). Prior to Admission medications   Medication Sig Start Date End Date Taking? Authorizing Provider  amiodarone (PACERONE) 200 MG tablet Take 0.5 tablets (100 mg total) by mouth daily. 04/01/14  Yes Sharee Holster, NP  amLODipine (NORVASC) 2.5 MG tablet Take 2.5 mg by mouth daily.   Yes Historical Provider, MD  aspirin EC 81 MG tablet Take 1 tablet (81 mg total) by mouth daily. 04/01/14  Yes Sharee Holster, NP  feeding supplement, ENSURE COMPLETE, (ENSURE COMPLETE) LIQD Take 237 mLs by mouth 2 (two) times daily between meals. 02/10/14  Yes  Renae Fickle, MD  furosemide (LASIX) 20 MG tablet Take 20 mg by mouth daily.    Yes Historical Provider, MD  LORazepam (ATIVAN) 0.5 MG tablet Take 0.5 mg by mouth every 8 (eight) hours as needed for anxiety.   Yes Historical Provider, MD  Multiple Vitamins-Minerals (DECUBI-VITE) CAPS Take 2 capsules by mouth daily.   Yes Historical Provider, MD  potassium chloride (K-DUR,KLOR-CON) 10 MEQ tablet Take 20 mEq by mouth daily.   Yes Historical Provider, MD  saccharomyces boulardii (FLORASTOR) 250 MG capsule Take 250 mg by mouth 2 (two) times daily.   Yes Historical Provider, MD  simvastatin (ZOCOR) 10 MG tablet Take 10 mg by mouth daily with lunch.   Yes Historical Provider, MD  tamsulosin (FLOMAX) 0.4 MG CAPS capsule Take 0.4 mg by mouth at bedtime.    Yes Historical Provider, MD  traMADol (ULTRAM) 50 MG tablet Take 50 mg by mouth daily.    Yes Historical Provider, MD  traMADol (ULTRAM) 50 MG tablet Take 50 mg by mouth every 6 (six) hours as needed for moderate pain.   Yes Historical Provider, MD   No Known Allergies  FAMILY HISTORY:  has no family status information on file.  SOCIAL HISTORY:  reports that he has never smoked. He has never used smokeless tobacco. He reports that he does not drink alcohol or use illicit drugs.  REVIEW OF SYSTEMS:  Unable to obtain as unrepsonsive on vent  See HPI   SUBJECTIVE: cardiac arrest  VITAL SIGNS: Pulse Rate:  [52-68] 52 (01/03 1016) Resp:  [12-17] 17 (01/03 1016) BP: (93-114)/(46-56) 93/46 mmHg (01/03 1016) SpO2:  [100 %] 100 % (01/03 1016) FiO2 (%):  [100 %] 100 % (01/03 0936) HEMODYNAMICS:   VENTILATOR SETTINGS: Vent Mode:  [-] PRVC FiO2 (%):  [100 %] 100 % Set Rate:  [16 bmp] 16 bmp Vt Set:  [540 mL] 540 mL PEEP:  [5 cmH20] 5 cmH20 Plateau Pressure:  [21 cmH20] 21 cmH20 INTAKE / OUTPUT:  Intake/Output Summary (Last 24 hours) at 04/04/2014 1058 Last data filed at 04/06/2014 1010  Gross per 24 hour  Intake      0 ml  Output     50 ml   Net    -50 ml    PHYSICAL EXAMINATION: General:  Elderly , on vent  Neuro:  Unresponsive  HEENT:  ETT  Cardiovascular:  Junctional , 2/6 SM , no edema  Lungs:  Basilar crackles , and exp wheezing  Abdomen:  Soft , hypoactive BS  Musculoskeletal:  Intact  Skin:  Wound dressing along lower extremities   LABS:  CBC  Recent Labs Lab 04/03/2014 0930 04/17/2014 0934  WBC 10.0  --   HGB 8.1* 9.5*  HCT 27.1* 28.0*  PLT 162  --    Coag's No results for input(s): APTT, INR in the last 168 hours. BMET  Recent Labs Lab 04/08/2014 0930 04/14/2014 0934  NA 137 136  K 5.9* 5.7*  CL 103 104  CO2 20  --   BUN 62* 62*  CREATININE 2.65* 2.30*  GLUCOSE 204* 203*   Electrolytes  Recent Labs Lab 04/16/2014 0930  CALCIUM 8.3*   Sepsis Markers  Recent Labs Lab 04/29/2014 0934  LATICACIDVEN 7.69*   ABG No results for input(s): PHART, PCO2ART, PO2ART in the last 168 hours. Liver Enzymes No results for input(s): AST, ALT, ALKPHOS, BILITOT, ALBUMIN in the last 168 hours. Cardiac Enzymes  Recent Labs Lab 04/29/2014 0930  TROPONINI 0.09*   Glucose No results for input(s): GLUCAP in the last 168 hours.  Imaging No results found.   ASSESSMENT / PLAN:  PULMONARY OETT 1/3 >> A:Acute Respiratory Failure secondary to cardiac arrest  Bilateral edema/Infiltrates ? Aspiration +/- edema  P:   Full Vent support  Daily WUA  Check ABG and CXR  Artic Sun protocol  Begin IV steroids-solumedrol 80 q 8  Begin IV abx with Unasyn  Begin BD   CARDIOVASCULAR CVL R. IJ 1/3 >> A: Witnessed Cardiac Arrest  Atrial Fib prev on amio CHF -last echo 11/2013 EF 40% , grade 2 DD  HTN -on norvasc/lasix  P:  Begin Artic Sun protocol  EKG  Cycle enzymes  Cards consult  Check bnp Hold norvasc and lasix   RENAL A:  Hyperkalemia  Acute on chronic Renal Failure  P:   Monitor and replace electrolytes    GASTROINTESTINAL A:  NPO  P:   PPI   HEMATOLOGIC A: Anemia  P:  Transfuse per  ICU protocol   INFECTIOUS A:  ?Aspiration PNA  P:   BCx2 1/3  UC 1/3  Sputum 1/3  Abx: Unasyn , start date 1/3 , day 1/   ENDOCRINE A:  DM P:   ICU SSI   NEUROLOGIC A:  Acute encephalopathy -had immediate CPR   P:   RASS goal: -5 during ARTIC protocol   EEG per ARTIC protocol    FAMILY  - Updates: none at bedside   - Inter-disciplinary family meet or  Palliative Care meeting due by:  1/10     TODAY'S SUMMARY: 81 yo male SNF pt admitted with witnessed arrest w/ immediate CPR. ARTIC sun protocol started in ER at 1/3 10:52 . Begin IV steroids and BD nebs for wheezing      Judit Awad NP-C  Pulmonary and Critical Care Medicine [facility] Pager: [phone]  04/26/2014, 10:58 AM

## 2014-04-05 NOTE — Progress Notes (Signed)
  Echocardiogram 2D Echocardiogram has been performed.  [name] 2014/04/18, 4:02 PM

## 2014-04-05 NOTE — Progress Notes (Signed)
Pt's tube was inserted to 24cm at the lips to stop leak around the tube.

## 2014-04-05 NOTE — ED Notes (Signed)
7.5 ET tube, 23 @ lip, bilateral breath sounds noted, positive color change.

## 2014-04-06 ENCOUNTER — Inpatient Hospital Stay (HOSPITAL_COMMUNITY): Payer: Medicare Other

## 2014-04-06 DIAGNOSIS — I429 Cardiomyopathy, unspecified: Secondary | ICD-10-CM

## 2014-04-06 DIAGNOSIS — G931 Anoxic brain damage, not elsewhere classified: Secondary | ICD-10-CM | POA: Insufficient documentation

## 2014-04-06 DIAGNOSIS — J9601 Acute respiratory failure with hypoxia: Secondary | ICD-10-CM

## 2014-04-06 DIAGNOSIS — N179 Acute kidney failure, unspecified: Secondary | ICD-10-CM | POA: Insufficient documentation

## 2014-04-06 DIAGNOSIS — R579 Shock, unspecified: Secondary | ICD-10-CM

## 2014-04-06 DIAGNOSIS — I428 Other cardiomyopathies: Secondary | ICD-10-CM

## 2014-04-06 DIAGNOSIS — R7989 Other specified abnormal findings of blood chemistry: Secondary | ICD-10-CM

## 2014-04-06 DIAGNOSIS — N189 Chronic kidney disease, unspecified: Secondary | ICD-10-CM

## 2014-04-06 LAB — BASIC METABOLIC PANEL
Anion gap: 4 — ABNORMAL LOW (ref 5–15)
Anion gap: 7 (ref 5–15)
Anion gap: 7 (ref 5–15)
Anion gap: 5 (ref 5–15)
Anion gap: 8 (ref 5–15)
Anion gap: 7 (ref 5–15)
Anion gap: 7 (ref 5–15)
BUN: 56 mg/dL — ABNORMAL HIGH (ref 6–23)
BUN: 57 mg/dL — AB (ref 6–23)
BUN: 57 mg/dL — ABNORMAL HIGH (ref 6–23)
BUN: 56 mg/dL — ABNORMAL HIGH (ref 6–23)
BUN: 57 mg/dL — ABNORMAL HIGH (ref 6–23)
BUN: 55 mg/dL — AB (ref 6–23)
BUN: 57 mg/dL — ABNORMAL HIGH (ref 6–23)
CALCIUM: 7.8 mg/dL — AB (ref 8.4–10.5)
CALCIUM: 7.9 mg/dL — AB (ref 8.4–10.5)
CHLORIDE: 108 meq/L (ref 96–112)
CHLORIDE: 111 meq/L (ref 96–112)
CHLORIDE: 109 meq/L (ref 96–112)
CO2: 26 mmol/L (ref 19–32)
CO2: 24 mmol/L (ref 19–32)
CO2: 24 mmol/L (ref 19–32)
CO2: 23 mmol/L (ref 19–32)
CO2: 22 mmol/L (ref 19–32)
CO2: 22 mmol/L (ref 19–32)
CO2: 22 mmol/L (ref 19–32)
CREATININE: 2.11 mg/dL — AB (ref 0.50–1.35)
CREATININE: 2.09 mg/dL — AB (ref 0.50–1.35)
Calcium: 7.9 mg/dL — ABNORMAL LOW (ref 8.4–10.5)
Calcium: 8.1 mg/dL — ABNORMAL LOW (ref 8.4–10.5)
Calcium: 7.9 mg/dL — ABNORMAL LOW (ref 8.4–10.5)
Calcium: 7.7 mg/dL — ABNORMAL LOW (ref 8.4–10.5)
Calcium: 7.7 mg/dL — ABNORMAL LOW (ref 8.4–10.5)
Chloride: 110 mEq/L (ref 96–112)
Chloride: 109 mEq/L (ref 96–112)
Chloride: 111 mEq/L (ref 96–112)
Chloride: 108 mEq/L (ref 96–112)
Creatinine, Ser: 2.15 mg/dL — ABNORMAL HIGH (ref 0.50–1.35)
Creatinine, Ser: 2.11 mg/dL — ABNORMAL HIGH (ref 0.50–1.35)
Creatinine, Ser: 2.1 mg/dL — ABNORMAL HIGH (ref 0.50–1.35)
Creatinine, Ser: 2.11 mg/dL — ABNORMAL HIGH (ref 0.50–1.35)
Creatinine, Ser: 2.13 mg/dL — ABNORMAL HIGH (ref 0.50–1.35)
GFR calc Af Amer: 33 mL/min — ABNORMAL LOW (ref >90)
GFR calc Af Amer: 33 mL/min — ABNORMAL LOW (ref >90)
GFR calc Af Amer: 33 mL/min — ABNORMAL LOW (ref >90)
GFR calc Af Amer: 33 mL/min — ABNORMAL LOW (ref >90)
GFR calc Af Amer: 32 mL/min — ABNORMAL LOW (ref >90)
GFR calc non Af Amer: 28 mL/min — ABNORMAL LOW (ref >90)
GFR calc non Af Amer: 28 mL/min — ABNORMAL LOW (ref >90)
GFR calc non Af Amer: 29 mL/min — ABNORMAL LOW (ref >90)
GFR calc non Af Amer: 29 mL/min — ABNORMAL LOW (ref >90)
GFR calc non Af Amer: 28 mL/min — ABNORMAL LOW (ref >90)
GFR calc non Af Amer: 28 mL/min — ABNORMAL LOW (ref >90)
GFR, EST AFRICAN AMERICAN: 32 mL/min — AB (ref >90)
GFR, EST AFRICAN AMERICAN: 33 mL/min — AB (ref >90)
GFR, EST NON AFRICAN AMERICAN: 28 mL/min — AB (ref >90)
GLUCOSE: 177 mg/dL — AB (ref 70–99)
GLUCOSE: 151 mg/dL — AB (ref 70–99)
GLUCOSE: 127 mg/dL — AB (ref 70–99)
GLUCOSE: 130 mg/dL — AB (ref 70–99)
GLUCOSE: 137 mg/dL — AB (ref 70–99)
Glucose, Bld: 162 mg/dL — ABNORMAL HIGH (ref 70–99)
Glucose, Bld: 126 mg/dL — ABNORMAL HIGH (ref 70–99)
POTASSIUM: 4.1 mmol/L (ref 3.5–5.1)
POTASSIUM: 3.9 mmol/L (ref 3.5–5.1)
POTASSIUM: 3.9 mmol/L (ref 3.5–5.1)
POTASSIUM: 4.2 mmol/L (ref 3.5–5.1)
Potassium: 4.2 mmol/L (ref 3.5–5.1)
Potassium: 4 mmol/L (ref 3.5–5.1)
Potassium: 4 mmol/L (ref 3.5–5.1)
SODIUM: 137 mmol/L (ref 135–145)
Sodium: 138 mmol/L (ref 135–145)
Sodium: 141 mmol/L (ref 135–145)
Sodium: 140 mmol/L (ref 135–145)
Sodium: 139 mmol/L (ref 135–145)
Sodium: 141 mmol/L (ref 135–145)
Sodium: 138 mmol/L (ref 135–145)

## 2014-04-06 LAB — GLUCOSE, CAPILLARY
GLUCOSE-CAPILLARY: 114 mg/dL — AB (ref 70–99)
GLUCOSE-CAPILLARY: 144 mg/dL — AB (ref 70–99)
GLUCOSE-CAPILLARY: 152 mg/dL — AB (ref 70–99)
GLUCOSE-CAPILLARY: 161 mg/dL — AB (ref 70–99)
GLUCOSE-CAPILLARY: 172 mg/dL — AB (ref 70–99)
Glucose-Capillary: 163 mg/dL — ABNORMAL HIGH (ref 70–99)
Glucose-Capillary: 129 mg/dL — ABNORMAL HIGH (ref 70–99)
Glucose-Capillary: 115 mg/dL — ABNORMAL HIGH (ref 70–99)
Glucose-Capillary: 131 mg/dL — ABNORMAL HIGH (ref 70–99)

## 2014-04-06 LAB — TROPONIN I
TROPONIN I: 0.12 ng/mL — AB (ref <0.031)
Troponin I: 0.1 ng/mL — ABNORMAL HIGH (ref <0.031)

## 2014-04-06 LAB — PHOSPHORUS: Phosphorus: 2.7 mg/dL (ref 2.3–4.6)

## 2014-04-06 LAB — PROCALCITONIN: Procalcitonin: 1.86 ng/mL

## 2014-04-06 LAB — LACTIC ACID, PLASMA: Lactic Acid, Venous: 1 mmol/L (ref 0.5–2.2)

## 2014-04-06 LAB — MAGNESIUM: Magnesium: 2 mg/dL (ref 1.5–2.5)

## 2014-04-06 MED ORDER — SODIUM CHLORIDE 0.9 % IV SOLN
INTRAVENOUS | Status: DC
  Administered 2014-04-06: 10 mL/h via INTRAVENOUS

## 2014-04-06 MED ORDER — COLLAGENASE 250 UNIT/GM EX OINT
TOPICAL_OINTMENT | Freq: Every day | CUTANEOUS | Status: DC
  Administered 2014-04-06 – 2014-04-07 (×2): 1 via TOPICAL
  Administered 2014-04-08 – 2014-04-10 (×3): via TOPICAL
  Administered 2014-04-11: 1 via TOPICAL
  Administered 2014-04-12 – 2014-04-14 (×3): via TOPICAL
  Filled 2014-04-06: qty 30

## 2014-04-06 MED ORDER — FAMOTIDINE IN NACL 20-0.9 MG/50ML-% IV SOLN
20.0000 mg | INTRAVENOUS | Status: DC
  Administered 2014-04-06 – 2014-04-07 (×2): 20 mg via INTRAVENOUS
  Filled 2014-04-06 (×2): qty 50

## 2014-04-06 MED ORDER — INSULIN ASPART 100 UNIT/ML ~~LOC~~ SOLN
0.0000 [IU] | SUBCUTANEOUS | Status: DC
  Administered 2014-04-06 – 2014-04-07 (×6): 2 [IU] via SUBCUTANEOUS
  Administered 2014-04-07: 3 [IU] via SUBCUTANEOUS
  Administered 2014-04-08: 2 [IU] via SUBCUTANEOUS
  Administered 2014-04-08 (×3): 3 [IU] via SUBCUTANEOUS
  Administered 2014-04-08: 5 [IU] via SUBCUTANEOUS
  Administered 2014-04-08: 2 [IU] via SUBCUTANEOUS
  Administered 2014-04-09 (×2): 3 [IU] via SUBCUTANEOUS
  Administered 2014-04-09: 5 [IU] via SUBCUTANEOUS
  Administered 2014-04-09 (×3): 3 [IU] via SUBCUTANEOUS
  Administered 2014-04-09: 5 [IU] via SUBCUTANEOUS
  Administered 2014-04-10: 2 [IU] via SUBCUTANEOUS
  Administered 2014-04-10: 3 [IU] via SUBCUTANEOUS
  Administered 2014-04-10: 5 [IU] via SUBCUTANEOUS
  Administered 2014-04-10: 3 [IU] via SUBCUTANEOUS
  Administered 2014-04-10: 2 [IU] via SUBCUTANEOUS
  Administered 2014-04-11 (×3): 3 [IU] via SUBCUTANEOUS
  Administered 2014-04-11: 2 [IU] via SUBCUTANEOUS
  Administered 2014-04-11: 3 [IU] via SUBCUTANEOUS
  Administered 2014-04-11: 2 [IU] via SUBCUTANEOUS
  Administered 2014-04-11 – 2014-04-12 (×5): 3 [IU] via SUBCUTANEOUS
  Administered 2014-04-12: 2 [IU] via SUBCUTANEOUS
  Administered 2014-04-13: 3 [IU] via SUBCUTANEOUS
  Administered 2014-04-13 (×2): 2 [IU] via SUBCUTANEOUS

## 2014-04-06 MED ORDER — ASPIRIN 325 MG PO TABS
325.0000 mg | ORAL_TABLET | Freq: Every day | ORAL | Status: DC
  Administered 2014-04-06: 325 mg
  Filled 2014-04-06: qty 1

## 2014-04-06 MED ORDER — ASPIRIN 81 MG PO CHEW
81.0000 mg | CHEWABLE_TABLET | Freq: Every day | ORAL | Status: DC
  Administered 2014-04-07: 81 mg via ORAL
  Filled 2014-04-06: qty 1

## 2014-04-06 MED ORDER — BUDESONIDE 0.25 MG/2ML IN SUSP
0.2500 mg | Freq: Four times a day (QID) | RESPIRATORY_TRACT | Status: DC
  Administered 2014-04-06 – 2014-04-11 (×18): 0.25 mg via RESPIRATORY_TRACT
  Filled 2014-04-06 (×26): qty 2

## 2014-04-06 MED FILL — Medication: Qty: 1 | Status: AC

## 2014-04-06 NOTE — Progress Notes (Signed)
PULMONARY / CRITICAL CARE MEDICINE   Name: Trygg Hopf MRN: [id number] DOB: 11-28-1933    ADMISSION DATE:  Apr 24, 2014  REFERRING MD :  EDP  CHIEF COMPLAINT:  Cardiac arrest   INITIAL PRESENTATION:  81 yo male SNF pt w/ witnessed cardiac arrest with immediate CPR , initial PEA noted , 25 min CPR w/ 3 epi prior to ER, ROS w/ junctional rhythm . PCCM to admit.   SIGNIFICANT EVENTS/STUDIES:  01/03 Admission via ED post PEA arrest. Donavan Foil protocol initiated 01/03 Echocardiogram: Mild LVH, LVEF 25-30%, Grade 2 diastolic dysfunction, moderately dilated LA, PASP est 42 mmHg 01/04 EEG: diffuse, nonspecific slowing 01/04 Cardiac markers negative   INDWELLING DEVICES: ETT 01/03 >>  R IJ CVL 01/03 >>  R radial A-line 01/03 >>   MICRO DATA: PCT 01/04: 1.86,  01/05:  Urine 01/03 >>  Resp  01/03 >>  Blood 01/03 >>    ANTIMICROBIALS:  Amp-sulbactam 01/03 >>    SUBJECTIVE:  Hypothermic, unresponsive   VITAL SIGNS: Temp:  [90.7 F (32.6 C)-92.5 F (33.6 C)] 92.5 F (33.6 C) (01/04 1500) Pulse Rate:  [54-81] 65 (01/04 1400) Resp:  [14-25] 14 (01/04 1400) BP: (119-156)/(48-75) 126/49 mmHg (01/04 1500) SpO2:  [100 %] 100 % (01/04 1400) Arterial Line BP: (131-174)/(47-62) 148/54 mmHg (01/04 1400) FiO2 (%):  [40 %-50 %] 40 % (01/04 1440) HEMODYNAMICS: CVP:  [2 mmHg-7 mmHg] 2 mmHg VENTILATOR SETTINGS: Vent Mode:  [-] PRVC FiO2 (%):  [40 %-50 %] 40 % Set Rate:  [14 bmp-16 bmp] 14 bmp Vt Set:  [540 mL] 540 mL PEEP:  [5 cmH20] 5 cmH20 Plateau Pressure:  [18 cmH20-28 cmH20] 26 cmH20 INTAKE / OUTPUT:  Intake/Output Summary (Last 24 hours) at 04/06/14 1506 Last data filed at 04/06/14 1400  Gross per 24 hour  Intake  946.2 ml  Output    540 ml  Net  406.2 ml    PHYSICAL EXAMINATION: General: sedated, paralyzed  Neuro: Pupils react, rest of exam not possible due to NMBs HEENT: NCAT Cardiovascular: reg, soft systolic M Lungs: clear anteriorly Abdomen:  Soft ,  hypoactive BS  Ext: no edema, cool, severe bilateral heel ulcers   LABS: I have reviewed all of today's lab results. Relevant abnormalities are discussed in the A/P section  CXR: mild edema pattern  ASSESSMENT / PLAN:  PULMONARY A: VDRF post cardiac arrest Pulmonary infiltrates - likely edema P:   Cont full vent support - settings reviewed and/or adjusted Cont vent bundle Daily SBT if/when meets criteria Continue nebulized BDs Begin nebulized steroids 01/04  CARDIOVASCULAR A:  Witnessed PEA arrest  H/O PAF Cardiomyopathy HTN P:  CVP goal 10-14 MAP goal > 80 mmHg while on arctic sun protocol MAP goal > 65 mmHg after rewarming Holding home antihypertensive medications  RENAL A:   Hyperkalemia, resolved CKD AKI, nonoliguric P:   Monitor BMET intermittently Monitor I/Os Correct electrolytes as indicated  GASTROINTESTINAL A:   No issues P:   SUP: IV famotidine NPO until after rewarming  HEMATOLOGIC A:  Anemia without acute blood loss P:  DVT px: SQ heparin Monitor CBC intermittently Transfuse per usual ICU guidelines  INFECTIOUS A:  Concern for aspiration PNA  P:   Micro and abx as above  ENDOCRINE A:   DM 2, diet controlled prior to admission P:   Cont SSI   NEUROLOGIC A:   Post anoxic encephalopathy  P:   RASS goal: N/A while paralyzed Complete rewarming and consider further assessment as indicated  FAMILY - updated 1/04    CCM X 45 mins  Billy Fischer, MD ; [facility] [phone].  After 5:30 PM or weekends, call [phone]

## 2014-04-06 NOTE — Care Management Note (Signed)
    Page 1 of 1   04/06/2014     1:19:34 PM CARE MANAGEMENT NOTE 04/06/2014  Patient:  [name], [name]   Account Number:  [id number]  Date Initiated:  04/06/2014  Documentation initiated by:  Junius Creamer  Subjective/Objective Assessment:   adm w cardiac arrest, vent     Action/Plan:   from nsg facility   Anticipated DC Date:     Anticipated DC Plan:    In-house referral  Clinical Social Worker         Choice offered to / List presented to:             Status of service:   Medicare Important Message given?   (If response is "NO", the following Medicare IM given date fields will be blank) Date Medicare IM given:   Medicare IM given by:   Date Additional Medicare IM given:   Additional Medicare IM given by:    Discharge Disposition:    Per UR Regulation:  Reviewed for med. necessity/level of care/duration of stay  If discussed at Long Length of Stay Meetings, dates discussed:    Comments:

## 2014-04-06 NOTE — Procedures (Signed)
History: 81 yo M s/p cardiac arrest with cooling protocol.   Sedation: fentanyl/versed  Technique: This is a 17 channel routine scalp EEG performed at the bedside with bipolar and monopolar montages arranged in accordance to the international 10/20 system of electrode placement. One channel was dedicated to EKG recording.    Background: The background consists of low voltage irregular delta activity with a marked attenuation of faster frequencies. There are occasional centrally predominant runs of 7 -8 hz activity resembling sleep structures.   Photic stimulation: Physiologic driving is not performed  EEG Abnormalities: 1) Irregular slow activity 2) low voltage EEG  Clinical Interpretation: This EEG is consistent with a  generalized non-specific cerebral dysfunction(encephalopathy), though non-specific, these findigns can be seen with medication induced sedation. There was no seizure or seizure predisposition recorded on this study.   Ritta Slot, MD [facility] [phone]  If 7pm- 7am, please page neurology on call as listed in AMION.

## 2014-04-06 NOTE — Progress Notes (Signed)
81 y.o. male with a PMHx of PVD, , who was admitted to [facility] on 2014-04-30 for evaluation of PEA cardiac arrest. .   The patient lives in a nursing facility. He was found down by the nursing staff. EMS was called. CPR was initiated immediately. They were able to regain a pulse after about 20 minutes. He was transported to [facility] for further evaluation. He has not regained consciousness. He remains on pressors - but lower doses.  Subjective:  Remains intubated, sedated & on Artic Sun.  Objective:  Vital Signs in the last 24 hours: Temp:  [90.7 F (32.6 C)-91.8 F (33.2 C)] 91 F (32.8 C) (01/04 1000) Pulse Rate:  [44-86] 66 (01/04 1000) Resp:  [12-25] 14 (01/04 1000) BP: (115-170)/(48-87) 132/52 mmHg (01/04 1000) SpO2:  [100 %] 100 % (01/04 1000) Arterial Line BP: (123-174)/(47-62) 146/57 mmHg (01/04 1000) FiO2 (%):  [40 %-100 %] 40 % (01/04 0822) Weight:  [165 lb 9.1 oz (75.1 kg)] 165 lb 9.1 oz (75.1 kg) (01/03 1245)  Intake/Output from previous day: 01/03 0701 - 01/04 0700 In: 1728.1 [I.V.:1628.1; IV Piggyback:100] Out: 400 [Urine:400] Intake/Output from this shift: Total I/O In: 276 [I.V.:156; NG/GT:120] Out: 150 [Urine:150]  Physical Exam: General appearance: intubated, sedated. chronically ill appearing Neck: no adenopathy, no carotid bruit and no JVD Lungs: coarse vent sounds, no rales Heart: regular rate and rhythm, S1, S2 normal and no obvious M/R/G Abdomen: soft, non-tender; bowel sounds normal; no masses,  no organomegaly Extremities: extremities normal, atraumatic, no cyanosis or edema and RUE swelling Pulses: 2+ and symmetric  Lab Results:  Recent Labs  2014/04/30 0930  04-30-2014 1052  2014/04/30 1859 2014/04/30 2027  WBC 10.0  --  9.0  --   --   --   HGB 8.1*  < > 7.5*  < > 8.5* 8.8*  PLT 162  --  123*  --   --   --   < > = values in this interval not displayed.  Recent Labs  04/06/14 0307 04/06/14 0600  NA 141 140  K 4.2 3.9  CL 110 109   CO2 24 24  GLUCOSE 162* 151*  BUN 57* 57*  CREATININE 2.11* 2.11*    Recent Labs  04-30-2014 2310 04/06/14 0421  TROPONINI 0.12* 0.10*   Hepatic Function Panel  Imaging:  CXR: Stable, mild pulm edema & Bilat effusions.  Cardiac Studies:   Echo: EF 25-30%, Gr 2 DD, mild PA pressure elevation of ~42 mmHg  Assessment/Plan:  Active Problems:   Non-ischemic cardiomyopathy   Troponin I above reference range   Cardiac arrest   Acute respiratory failure with hypoxia   Shock circulatory   Coma h/o PAF - on Amio @ home; in NSR, not on Anticoagulation beyond ASA.  Overall remains relatively stable from a cardiac standpoint;  Since on pressors, no room to initiate BB +/- afterload reduction. No HF findings, but with low EF, will need to be judicious with IV Intake to avoid pulmonary edema. Continue ASA - can reduce to 81mg   Per Dr. Harvie Bridge note yesterday - he talked with his son - Brandy Milum & discussed Donavan Foil.. He describes a steady decline in Mr. Lac condition over the past year. He has had several strokes and was not able to live independently. At this point , Dr. Robet Leu felt that we should be rather conservative in our management.    LOS: 1 day    Lindon Kiel W 04/06/2014, 11:23 AM

## 2014-04-06 NOTE — Progress Notes (Signed)
Changed dressing to right a/c peripheral IV line, skin tear while removing previous tegaderm and tape, applied small foam dressing to area after cleansing.  Also applied prevalon boots to feet bilateral per [facility] nurse order.  Patient has multiple skin tears and weeping skin on arms and hands bilat, he has multiple foam dressings applied as well.  Also has scrotal edema supported with pillow case to help support and sling.  Will continue to monitor

## 2014-04-06 NOTE — Progress Notes (Signed)
Advanced tube to 27cm at lip to stop audible snoring noise around tube.

## 2014-04-06 NOTE — Consult Note (Addendum)
WOC wound consult note Reason for Consult: Consult requested for bilat foot wounds.  Pt has been followed in the past by Dr Lajoyce Corners of the ortho service for chronic wounds to bilat heels. Wound type: Left heel with 2 stage 4 wounds; .8X.8X.2cm and 1X1X.2cm.  Both are separated by a narrow island of skin. One wound 80% red, 20% dark brown, the other is 50% red, 50% dark brown.  No odor, small amt tan drainage, bone palpable with swab. Right heel is unstageable; 3X3cm, 100% loose eschar, fluctuant with strong foul odor.  Pressure Ulcer POA: Yes Pt has several areas of partail thickness skin loss which have been appropriately managed with foam silicone dressings to minimize adherence to skin and promote healing.  He is on a Sport low air loss bed to reduce pressure. Dressing procedure/placement/frequency: Continue foam dressings to skin tears to promote healing.  Reduce pressure to bilat heels with Prevalon heel lift boots.  Santyl for chemical debridement of nonviable tissue to bilat heels.   Pt could benefit from x-ray to R/O osteomyelitis to right heel R/T strong foul odor, please order if desired. Please re-consult if further assistance is needed.  Thank-you,  Cammie Mcgee MSN, RN, CWOCN, [place], CNS [phone]

## 2014-04-06 NOTE — Progress Notes (Addendum)
EEG Complete, Results pending. Dr Sung Amabile did come in near the end of the study and stated that he had just canceled the EEG however since it was nearly finished it was completed.

## 2014-04-07 ENCOUNTER — Inpatient Hospital Stay (HOSPITAL_COMMUNITY): Payer: Medicare Other

## 2014-04-07 DIAGNOSIS — J9690 Respiratory failure, unspecified, unspecified whether with hypoxia or hypercapnia: Secondary | ICD-10-CM

## 2014-04-07 DIAGNOSIS — I469 Cardiac arrest, cause unspecified: Secondary | ICD-10-CM

## 2014-04-07 DIAGNOSIS — Z9289 Personal history of other medical treatment: Secondary | ICD-10-CM

## 2014-04-07 LAB — COMPREHENSIVE METABOLIC PANEL
ALT: 80 U/L — AB (ref 0–53)
AST: 37 U/L (ref 0–37)
Albumin: 1.6 g/dL — ABNORMAL LOW (ref 3.5–5.2)
Alkaline Phosphatase: 50 U/L (ref 39–117)
Anion gap: 8 (ref 5–15)
BILIRUBIN TOTAL: 0.9 mg/dL (ref 0.3–1.2)
BUN: 56 mg/dL — ABNORMAL HIGH (ref 6–23)
CALCIUM: 7.9 mg/dL — AB (ref 8.4–10.5)
CHLORIDE: 110 meq/L (ref 96–112)
CO2: 23 mmol/L (ref 19–32)
Creatinine, Ser: 2.18 mg/dL — ABNORMAL HIGH (ref 0.50–1.35)
GFR calc Af Amer: 32 mL/min — ABNORMAL LOW (ref >90)
GFR, EST NON AFRICAN AMERICAN: 27 mL/min — AB (ref >90)
Glucose, Bld: 138 mg/dL — ABNORMAL HIGH (ref 70–99)
Potassium: 4.4 mmol/L (ref 3.5–5.1)
SODIUM: 141 mmol/L (ref 135–145)
Total Protein: 4.7 g/dL — ABNORMAL LOW (ref 6.0–8.3)

## 2014-04-07 LAB — LEGIONELLA ANTIGEN, URINE

## 2014-04-07 LAB — PROCALCITONIN: Procalcitonin: 1.96 ng/mL

## 2014-04-07 LAB — GLUCOSE, CAPILLARY
GLUCOSE-CAPILLARY: 130 mg/dL — AB (ref 70–99)
GLUCOSE-CAPILLARY: 152 mg/dL — AB (ref 70–99)
Glucose-Capillary: 124 mg/dL — ABNORMAL HIGH (ref 70–99)
Glucose-Capillary: 129 mg/dL — ABNORMAL HIGH (ref 70–99)
Glucose-Capillary: 138 mg/dL — ABNORMAL HIGH (ref 70–99)
Glucose-Capillary: 145 mg/dL — ABNORMAL HIGH (ref 70–99)
Glucose-Capillary: 158 mg/dL — ABNORMAL HIGH (ref 70–99)

## 2014-04-07 LAB — CBC
HEMATOCRIT: 23.9 % — AB (ref 39.0–52.0)
Hemoglobin: 7.4 g/dL — ABNORMAL LOW (ref 13.0–17.0)
MCH: 25.3 pg — AB (ref 26.0–34.0)
MCHC: 31 g/dL (ref 30.0–36.0)
MCV: 81.6 fL (ref 78.0–100.0)
Platelets: 89 10*3/uL — ABNORMAL LOW (ref 150–400)
RBC: 2.93 MIL/uL — ABNORMAL LOW (ref 4.22–5.81)
RDW: 17.8 % — AB (ref 11.5–15.5)
WBC: 4.4 10*3/uL (ref 4.0–10.5)

## 2014-04-07 MED ORDER — ASPIRIN 81 MG PO CHEW
81.0000 mg | CHEWABLE_TABLET | Freq: Every day | ORAL | Status: DC
  Administered 2014-04-08 – 2014-04-12 (×5): 81 mg
  Filled 2014-04-07 (×5): qty 1

## 2014-04-07 MED ORDER — LEVETIRACETAM IN NACL 500 MG/100ML IV SOLN
500.0000 mg | Freq: Two times a day (BID) | INTRAVENOUS | Status: DC
  Administered 2014-04-07 – 2014-04-08 (×2): 500 mg via INTRAVENOUS
  Filled 2014-04-07 (×3): qty 100

## 2014-04-07 MED ORDER — LEVETIRACETAM IN NACL 1000 MG/100ML IV SOLN
1000.0000 mg | Freq: Once | INTRAVENOUS | Status: AC
  Administered 2014-04-07: 1000 mg via INTRAVENOUS
  Filled 2014-04-07: qty 100

## 2014-04-07 MED ORDER — VITAL HIGH PROTEIN PO LIQD
1000.0000 mL | ORAL | Status: DC
  Administered 2014-04-07: 1000 mL
  Administered 2014-04-07 – 2014-04-08 (×2)
  Filled 2014-04-07 (×4): qty 1000

## 2014-04-07 MED ORDER — LORAZEPAM 2 MG/ML IJ SOLN
1.0000 mg | Freq: Once | INTRAMUSCULAR | Status: AC
  Administered 2014-04-07: 2 mg via INTRAVENOUS
  Filled 2014-04-07: qty 1

## 2014-04-07 MED ORDER — FAMOTIDINE 40 MG/5ML PO SUSR
20.0000 mg | Freq: Every day | ORAL | Status: DC
Start: 2014-04-07 — End: 2014-04-14
  Administered 2014-04-08 – 2014-04-14 (×7): 20 mg
  Filled 2014-04-07 (×8): qty 2.5

## 2014-04-07 NOTE — Progress Notes (Signed)
eLink Physician-Brief Progress Note Patient Name: Osamah Kesten DOB: 1933-10-11 MRN: [id number]   Date of Service  04/07/2014  HPI/Events of Note  Twitching of eyelids post rewarm, paralytic off EEG 1/4 was neg  eICU Interventions  Load with keppra Ativan x 1 Head CT Neuro input     Intervention Category Major Interventions: Seizures - evaluation and management  Mariangela Heldt V. 04/07/2014, 2:37 AM

## 2014-04-07 NOTE — Progress Notes (Signed)
Started pt on normothermia at 0200 per protocol. Will continue to monitor.
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
## 2014-04-07 NOTE — Progress Notes (Signed)
EEG completed; results pending.    

## 2014-04-07 NOTE — Progress Notes (Signed)
PULMONARY / CRITICAL CARE MEDICINE   Name: [name] MRN: [id number] DOB: 07-06-1933    ADMISSION DATE:  04/23/2014  REFERRING MD :  EDP  CHIEF COMPLAINT:  Cardiac arrest   INITIAL PRESENTATION:  81 yo male SNF pt w/ witnessed cardiac arrest with immediate CPR , initial PEA noted , 25 min CPR w/ 3 epi prior to ER, ROS w/ junctional rhythm . PCCM to admit.   SIGNIFICANT EVENTS/STUDIES:  01/03 Admission via ED post PEA arrest. Arctic Sun protocol initiated 01/03 Echocardiogram: Mild LVH, LVEF 25-30%, Grade 2 diastolic dysfunction, moderately dilated LA, PASP est 42 mmHg 01/04 EEG: diffuse, nonspecific slowing 01/04 Cardiac markers negative 01/05 Rewarmed. Very minimal response to deep pain. Apneic when changed to PSV mode.  01/05 Repeat EEG: diffuse slowing 01/05 CT head: multiple old strokes and encephalomalacia. Mod-severe cerebral atrophy.  01/05 Family conference: DNR if arrests. Continue current support. Reassess daily with plans for withdrawal if no improvement in 24-48 hrs   INDWELLING DEVICES: ETT 01/03 >>  R IJ CVL 01/03 >>  R radial A-line 01/03 >>   MICRO DATA: PCT 01/04: 1.86,  01/05:  Urine 01/03 >> 100k E coli Resp  01/03 >> abundant staph >>  Blood 01/03 >>    ANTIMICROBIALS:  Amp-sulbactam 01/03 >>    SUBJECTIVE:  Minimally responsive to deep pain. No sedatives or opioids X almost 24 hrs  VITAL SIGNS: Temp:  [93.2 F (34 C)-99 F (37.2 C)] 98.4 F (36.9 C) (01/05 1500) Pulse Rate:  [71-93] 75 (01/05 1614) Resp:  [12-19] 14 (01/05 1614) BP: (87-139)/(38-56) 139/47 mmHg (01/05 1614) SpO2:  [100 %] 100 % (01/05 1614) Arterial Line BP: (115-152)/(40-52) 140/44 mmHg (01/05 1500) FiO2 (%):  [40 %] 40 % (01/05 1614) HEMODYNAMICS: CVP:  [2 mmHg-6 mmHg] 5 mmHg VENTILATOR SETTINGS: Vent Mode:  [-] PRVC FiO2 (%):  [40 %] 40 % Set Rate:  [14 bmp] 14 bmp Vt Set:  [540 mL] 540 mL PEEP:  [5 cmH20] 5 cmH20 Plateau Pressure:  [22 cmH20-25 cmH20] 24  cmH20 INTAKE / OUTPUT:  Intake/Output Summary (Last 24 hours) at 04/07/14 1647 Last data filed at 04/07/14 1600  Gross per 24 hour  Intake 904.37 ml  Output    347 ml  Net 557.37 ml    PHYSICAL EXAMINATION: General: RASS -4, no spont movement, not F/C  Neuro: Pupils react, minimal withdrawal HEENT: NCAT Cardiovascular: reg, soft systolic M Lungs: clear anteriorly Abdomen:  Soft , hypoactive BS  Ext: no edema, cool, severe bilateral heel ulcers   LABS: I have reviewed all of today's lab results. Relevant abnormalities are discussed in the A/P section  CXR: improved bibasilar aeration  ASSESSMENT / PLAN:  PULMONARY A: VDRF post cardiac arrest Pulmonary infiltrates, improved P:   Cont full vent support - settings reviewed and/or adjusted Cont vent bundle Daily SBT if/when meets criteria Continue nebulized BDs Cont nebulized steroids   CARDIOVASCULAR A:  Witnessed PEA arrest  H/O PAF Cardiomyopathy HTN P:  CVP goal 10-14 MAP goal > 65 mmHg Holding home antihypertensive medications  RENAL A:   Hyperkalemia, resolved CKD AKI, nonoliguric P:   Monitor BMET intermittently Monitor I/Os Correct electrolytes as indicated  GASTROINTESTINAL A:   No issues P:   SUP: enteral famotidine Begin TFs 01/05  HEMATOLOGIC A:  Anemia without acute blood loss P:  DVT px: SQ heparin Monitor CBC intermittently Transfuse per usual ICU guidelines  INFECTIOUS A:  Concern for aspiration PNA  E coli UTI P:  Micro and abx as above  ENDOCRINE A:   DM 2, diet controlled prior to admission P:   Cont SSI   NEUROLOGIC A:   Post anoxic encephalopathy Multiple prior CVAs with encephalomalacia Mod-severe cortical atrophy by CT head 01/05 P:   RASS goal: 0 Minimize sedation/opioids   FAMILY - updated 1/05 as above   CCM X 50 mins  Billy Fischer, MD ; [facility] [phone].  After 5:30 PM or weekends, call [phone]

## 2014-04-07 NOTE — Progress Notes (Signed)
Patient has multiple skin tears with mepilex/foam gauze covering on arms and hands bilat, arms weeping serous fluid bilat as well, +3 generalized edema notes as well.  While doing assessment prior to duplex doppler of lower extremities, found the left groin to be excoriated from arctic sun pads, cleansed and reapplied pads lower to prevent further rubbing.

## 2014-04-07 NOTE — Procedures (Signed)
History: 81 yo M s/p cardiac arrest with cooling protocol.   Sedation: fentanyl/versed  Technique: This is a 17 channel routine scalp EEG performed at the bedside with bipolar and monopolar montages arranged in accordance to the international 10/20 system of electrode placement. One channel was dedicated to EKG recording.    Background: The background consists of low voltage irregular delta activity with a marked attenuation of faster frequencies. There are occasional centrally predominant runs of 7 -8 hz activity resembling sleep structures.   Photic stimulation: Physiologic driving is not performed  EEG Abnormalities: 1) Irregular slow activity 2) low voltage EEG  Clinical Interpretation: This EEG is consistent with a generalized non-specific cerebral dysfunction(encephalopathy). There was no seizure or seizure predisposition recorded on this study. Compared to the study of yesterday, there has been little change.   Ritta Slot, MD [facility] [phone]  If 7pm- 7am, please page neurology on call as listed in AMION.

## 2014-04-07 NOTE — Progress Notes (Signed)
Notified Dr. Vassie Loll of pt eyes quivering. New orders obtained. Will continue to monitor.
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
## 2014-04-07 NOTE — Progress Notes (Signed)
VASCULAR LAB PRELIMINARY  PRELIMINARY  PRELIMINARY  PRELIMINARY  Bilateral lower extremity venous duplex  completed.    Preliminary report:  Bilateral:  No evidence of DVT, superficial thrombosis, or Baker's Cyst.  Left popliteal vein not imaged secondary to inability to bend knee.      Fatima Fedie, RVT 04/07/2014, 10:10 AM

## 2014-04-07 NOTE — Progress Notes (Signed)
WIS 60 Fentanyl and 30 Versed witnessed by [place] Perkins, RN

## 2014-04-08 ENCOUNTER — Inpatient Hospital Stay (HOSPITAL_COMMUNITY): Payer: Medicare Other

## 2014-04-08 DIAGNOSIS — N390 Urinary tract infection, site not specified: Secondary | ICD-10-CM | POA: Insufficient documentation

## 2014-04-08 LAB — CULTURE, RESPIRATORY W GRAM STAIN: Gram Stain: NONE SEEN

## 2014-04-08 LAB — COMPREHENSIVE METABOLIC PANEL
ALK PHOS: 49 U/L (ref 39–117)
ALT: 55 U/L — ABNORMAL HIGH (ref 0–53)
AST: 20 U/L (ref 0–37)
Albumin: 1.6 g/dL — ABNORMAL LOW (ref 3.5–5.2)
Anion gap: 7 (ref 5–15)
BILIRUBIN TOTAL: 0.9 mg/dL (ref 0.3–1.2)
BUN: 61 mg/dL — ABNORMAL HIGH (ref 6–23)
CALCIUM: 7.9 mg/dL — AB (ref 8.4–10.5)
CO2: 23 mmol/L (ref 19–32)
CREATININE: 2.15 mg/dL — AB (ref 0.50–1.35)
Chloride: 113 mEq/L — ABNORMAL HIGH (ref 96–112)
GFR calc Af Amer: 32 mL/min — ABNORMAL LOW (ref >90)
GFR calc non Af Amer: 28 mL/min — ABNORMAL LOW (ref >90)
Glucose, Bld: 155 mg/dL — ABNORMAL HIGH (ref 70–99)
Potassium: 4.3 mmol/L (ref 3.5–5.1)
SODIUM: 143 mmol/L (ref 135–145)
Total Protein: 4.5 g/dL — ABNORMAL LOW (ref 6.0–8.3)

## 2014-04-08 LAB — URINE CULTURE: Colony Count: >=100000

## 2014-04-08 LAB — CBC
HCT: 23.7 % — ABNORMAL LOW (ref 39.0–52.0)
HEMOGLOBIN: 7.4 g/dL — AB (ref 13.0–17.0)
MCH: 26 pg (ref 26.0–34.0)
MCHC: 31.2 g/dL (ref 30.0–36.0)
MCV: 83.2 fL (ref 78.0–100.0)
Platelets: 86 10*3/uL — ABNORMAL LOW (ref 150–400)
RBC: 2.85 MIL/uL — ABNORMAL LOW (ref 4.22–5.81)
RDW: 18.3 % — ABNORMAL HIGH (ref 11.5–15.5)
WBC: 3.8 10*3/uL — AB (ref 4.0–10.5)

## 2014-04-08 LAB — GLUCOSE, CAPILLARY
GLUCOSE-CAPILLARY: 137 mg/dL — AB (ref 70–99)
GLUCOSE-CAPILLARY: 192 mg/dL — AB (ref 70–99)
Glucose-Capillary: 157 mg/dL — ABNORMAL HIGH (ref 70–99)
Glucose-Capillary: 227 mg/dL — ABNORMAL HIGH (ref 70–99)

## 2014-04-08 LAB — CULTURE, RESPIRATORY

## 2014-04-08 MED ORDER — VANCOMYCIN HCL IN DEXTROSE 1-5 GM/200ML-% IV SOLN
1000.0000 mg | Freq: Once | INTRAVENOUS | Status: AC
  Administered 2014-04-08: 1000 mg via INTRAVENOUS
  Filled 2014-04-08: qty 200

## 2014-04-08 MED ORDER — VANCOMYCIN HCL IN DEXTROSE 750-5 MG/150ML-% IV SOLN
750.0000 mg | INTRAVENOUS | Status: DC
  Administered 2014-04-09 – 2014-04-12 (×4): 750 mg via INTRAVENOUS
  Filled 2014-04-08 (×5): qty 150

## 2014-04-08 MED ORDER — VITAL HIGH PROTEIN PO LIQD
1000.0000 mL | ORAL | Status: DC
  Administered 2014-04-08 – 2014-04-13 (×7): 1000 mL
  Filled 2014-04-08 (×11): qty 1000

## 2014-04-08 MED ORDER — PIPERACILLIN-TAZOBACTAM 3.375 G IVPB
3.3750 g | Freq: Three times a day (TID) | INTRAVENOUS | Status: DC
  Administered 2014-04-08 – 2014-04-13 (×15): 3.375 g via INTRAVENOUS
  Filled 2014-04-08 (×17): qty 50

## 2014-04-08 NOTE — Progress Notes (Signed)
Notified Dr. Vassie Loll pt has E. Coli and ESBL in urine. Will continue to monitor

## 2014-04-08 NOTE — Progress Notes (Signed)
   04/08/14 1200  Clinical Encounter Type  Visited With Family  Visit Type Follow-up;Spiritual support;Social support  Stress Factors  Family Stress Factors Major life changes   Chaplain spoke with patient's son outside of the patient's room today. Patient's son said that today he is just trying to have hope. Patient's son said that the medical team has indicated that the patient's brain activity does not look good. However, patient's son said he was with the patient yesterday and touched the patient's leg and it moved. Patient's son said that this would be easier and he would be more at peace if he knew the patient was tired and ready to pass on. Patient's son feels like he needs to hear this to be at peace with letting the patient pass away. Patient's son has been taking time off of work and feels like he has to be there for the patient during this time as he is the patient's only son. Patient's son seems to be feeling burdened and also in deep grief. Chaplain will continue to provide emotional and spiritual support for patient and patient's family as needed. Dara Hoyer  Pager: [phone] 12:04 PM

## 2014-04-08 NOTE — Progress Notes (Signed)
Subjective: No overnight events. No seizure activity noted. Remains off all sedation.  EEG completed shows no seizure activity, shows diffuse slowing  CT head imaging reviewed, shows no acute process  Objective: Current vital signs: BP 139/47 mmHg  Pulse 69  Temp(Src) 98.4 F (36.9 C) (Core (Comment))  Resp 14  Ht  (1.727 m)  Wt 75.1 kg (165 lb 9.1 oz)  BMI 25.18 kg/m2  SpO2 100% Vital signs in last 24 hours: Temp:  [97.3 F (36.3 C)-99 F (37.2 C)] 98.4 F (36.9 C) (01/06 0600) Pulse Rate:  [69-81] 69 (01/06 0720) Resp:  [12-19] 14 (01/06 0720) BP: (101-139)/(38-47) 139/47 mmHg (01/05 1614) SpO2:  [100 %] 100 % (01/06 0720) Arterial Line BP: (129-151)/(41-51) 149/48 mmHg (01/06 0600) FiO2 (%):  [40 %] 40 % (01/06 0335)  Intake/Output from previous day: 01/05 0701 - 01/06 0700 In: 1161.7 [I.V.:225; NG/GT:536.7; IV Piggyback:400] Out: 529 [Urine:529] Intake/Output this shift:   Nutritional status:    Neurologic Exam: Mental Status: Patient does not respond to verbal stimuli. Does not respond to deep sternal rub. Does not follow commands. No verbalizations noted.  Cranial Nerves: II: patient does not respond confrontation bilaterally, pupils right 3 mm, left 3 mm,and reactive bilaterally III,IV,VI: doll's response present bilaterally. No nystagmus noted. V,VII: corneal reflex absent bilaterally  VIII: patient does not respond to verbal stimuli Motor: Extremities flaccid throughout. No spontaneous movement noted of the extremities. No purposeful movements noted of the extremities. Sensory: Does not respond to noxious stimuli in any extremity. Deep Tendon Reflexes:  2+ in the upper extremities and absent in the lower extremities Plantars: upgoing on the right and mute on the left Cerebellar: Unable to perform  Lab Results: Basic Metabolic Panel:  Recent Labs Lab 04/06/14 0307  04/06/14 1500 04/06/14 1800 04/06/14 2300 04/07/14 0350  04/08/14 0410  NA 141  < > 141 138 137 141 143  K 4.2  < > 4.0 4.0 4.2 4.4 4.3  CL 110  < > 111 109 108 110 113*  CO2 24  < > GLUCOSE 162*  < > 130* 126* 137* 138* 155*  BUN 57*  < > 57* 55* 57* 56* 61*  CREATININE 2.11*  < > 2.09* 2.11* 2.13* 2.18* 2.15*  CALCIUM 8.1*  < > 7.7* 7.8* 7.9* 7.9* 7.9*  MG 2.0  --   --   --   --   --   --   PHOS 2.7  --   --   --   --   --   --   < > = values in this interval not displayed.  Liver Function Tests:  Recent Labs Lab 04/07/14 0350 04/08/14 0410  AST 37 20  ALT 80* 55*  ALKPHOS 50 49  BILITOT 0.9 0.9  PROT 4.7* 4.5*  ALBUMIN 1.6* 1.6*   No results for input(s): LIPASE, AMYLASE in the last 168 hours. No results for input(s): AMMONIA in the last 168 hours.  CBC:  Recent Labs Lab April 27, 2014 0930  27-Apr-2014 1052  April 27, 2014 1700 April 27, 2014 1859 April 27, 2014 2027 04/07/14 0350 04/08/14 0410  WBC 10.0  --  9.0  --   --   --   --  4.4 3.8*  NEUTROABS 8.6*  --   --   --   --   --   --   --   --   HGB 8.1*  < > 7.5*  < > 8.5* 8.5* 8.8* 7.4* 7.4*  HCT 27.1*  < > 25.2*  < > 25.0* 25.0* 26.0* 23.9* 23.7*  MCV 88.6  --  85.4  --   --   --   --  81.6 83.2  PLT 162  --  123*  --   --   --   --  89* 86*  < > = values in this interval not displayed.  Cardiac Enzymes:  Recent Labs Lab 04/22/2014 0930 04/04/2014 1052 05/02/2014 1652 05/02/2014 2310 04/06/14 0421  TROPONINI 0.09* 0.08* 0.12* 0.12* 0.10*    Lipid Panel: No results for input(s): CHOL, TRIG, HDL, CHOLHDL, VLDL, LDLCALC in the last 168 hours.  CBG:  Recent Labs Lab 04/07/14 1114 04/07/14 1623 04/07/14 1940 04/07/14 2342 04/08/14 0413  GLUCAP 124* 145* 152* 158* 137*    Microbiology: Results for orders placed or performed during the hospital encounter of 04/20/2014  Urine culture     Status: None   Collection Time: 04/04/2014 11:47 AM  Result Value Ref Range Status   Specimen Description URINE, CATHETERIZED  Final   Special Requests NONE  Final   Colony  Count   Final    >=100,000 COLONIES/ML Performed at [facility]    Culture   Final    ESCHERICHIA COLI Note: Confirmed Extended Spectrum Beta-Lactamase Producer (ESBL) CRITICAL RESULT CALLED TO, READ BACK BY AND VERIFIED WITH: CAROLINE FOWLER AT 3:56 A.M. ON 04/08/2013 WARRB Performed at [facility]    Report Status 04/08/2014 FINAL  Final   Organism ID, Bacteria ESCHERICHIA COLI  Final      Susceptibility   Escherichia coli - MIC*    AMPICILLIN >=32 RESISTANT Resistant     CEFAZOLIN >=64 RESISTANT Resistant     CEFTRIAXONE >=64 RESISTANT Resistant     CIPROFLOXACIN >=4 RESISTANT Resistant     GENTAMICIN <=1 SENSITIVE Sensitive     LEVOFLOXACIN >=8 RESISTANT Resistant     NITROFURANTOIN <=16 SENSITIVE Sensitive     TOBRAMYCIN <=1 SENSITIVE Sensitive     TRIMETH/SULFA >=320 RESISTANT Resistant     IMIPENEM <=0.25 SENSITIVE Sensitive     PIP/TAZO <=4 SENSITIVE Sensitive     * ESCHERICHIA COLI  MRSA PCR Screening     Status: Abnormal   Collection Time: 04/19/2014  1:58 PM  Result Value Ref Range Status   MRSA by PCR POSITIVE (A) NEGATIVE Final    Comment:        The GeneXpert MRSA Assay (FDA approved for NASAL specimens only), is one component of a comprehensive MRSA colonization surveillance program. It is not intended to diagnose MRSA infection nor to guide or monitor treatment for MRSA infections. RESULT CALLED TO, READ BACK BY AND VERIFIED WITH: L.SHORT 04/14/2014 1620 BY V.WILKINS   Culture, blood (routine x 2)     Status: None (Preliminary result)   Collection Time: 04/26/2014  2:10 PM  Result Value Ref Range Status   Specimen Description BLOOD LEFT ARM  Final   Special Requests BOTTLES DRAWN AEROBIC ONLY 5CC  Final   Culture   Final           BLOOD CULTURE RECEIVED NO GROWTH TO DATE CULTURE WILL BE HELD FOR 5 DAYS BEFORE ISSUING A FINAL NEGATIVE REPORT Performed at [facility]    Report Status PENDING  Incomplete  Culture, blood (routine x 2)      Status: None (Preliminary result)   Collection Time: 04/06/2014  2:30 PM  Result Value Ref Range Status   Specimen Description BLOOD LEFT ARM  Final   Special Requests BOTTLES DRAWN AEROBIC ONLY 3CC  Final   Culture   Final           BLOOD CULTURE RECEIVED NO GROWTH TO DATE CULTURE WILL BE HELD FOR 5 DAYS BEFORE ISSUING A FINAL NEGATIVE REPORT Note: Culture results may be compromised due to an inadequate volume of blood received in culture bottles. Performed at [facility]    Report Status PENDING  Incomplete  Culture, respiratory (NON-Expectorated)     Status: None (Preliminary result)   Collection Time: May 01, 2014  4:19 PM  Result Value Ref Range Status   Specimen Description TRACHEAL ASPIRATE  Final   Special Requests NONE  Final   Gram Stain   Final    NO WBC SEEN NO SQUAMOUS EPITHELIAL CELLS SEEN RARE GRAM NEGATIVE RODS RARE GRAM POSITIVE COCCI IN PAIRS Performed at [facility]    Culture   Final    ABUNDANT STAPHYLOCOCCUS AUREUS Note: RIFAMPIN AND GENTAMICIN SHOULD NOT BE USED AS SINGLE DRUGS FOR TREATMENT OF STAPH INFECTIONS. 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 Current history of diabetes and hypertension.  EXAM: CT HEAD WITHOUT CONTRAST  TECHNIQUE: Contiguous axial images were obtained from the base of the skull through the vertex without intravenous contrast.  COMPARISON:  Multiple prior CT head examinations dating back to 05/30/2007, most recently 11/04/2013.  FINDINGS: Moderate to severe cortical atrophy and mild deep atrophy, progressive since 2009, unchanged since the most recent examination 11/2013. Severe changes of small vessel disease of the white matter diffusely, unchanged.  Encephalomalacia in the left occipital lobe, increased since the 11/2013 examination. Old focal left superior cerebellar stroke, unchanged. No new/acute cortical stroke. No acute hemorrhage or hematoma. No extra-axial fluid collections. No evidence of generalized cerebral edema at this time.  No skull fracture or other focal osseous abnormality involving the skull. Small amount of fluid in the inferior mastoid air cells bilaterally. Both middle ear cavities well-aerated. Visualized paranasal sinuses well aerated. Extensive bilateral carotid siphon left vertebral artery atherosclerosis.  IMPRESSION: 1. No acute intracranial abnormality. Specifically, no evidence of generalized cerebral edema. 2. Old cortical stroke involving the left occipital lobe, with increase in the area of encephalomalacia in the left occipital lobe since 11/2013. 3. Old focal left superior cerebellar hemispheric stroke. 4. Moderate to severe cortical atrophy, mild deep atrophy and severe chronic microvascular ischemic changes of the white matter. 5. Small bilateral mastoid effusions.   Electronically Signed   By: Hulan Saas M.D.   On: 04/07/2014 13:10   Dg Chest Port 1 View  04/08/2014   CLINICAL DATA:  Respiratory failure, shortness of breath  EXAM: PORTABLE CHEST - 1 VIEW  COMPARISON:  Portable chest x-ray of January 5th 2016 and 04/14/2014  FINDINGS: The lungs are adequately inflated. There are patchy increased densities at both bases consistent with subsegmental atelectasis. The cardiac silhouette is mildly enlarged. The pulmonary vascularity is not engorged. External pacemaker defibrillator pads are present. The endotracheal tube tip projects 3.8 cm above the crotch of the carina. The esophagogastric tube tip projects below the inferior margin of the image. The right internal jugular venous catheter tip projects over the cavoatrial junction.  IMPRESSION: There has  been further interval improvement in the appearance of the lung  bases. Minimal subsegmental atelectasis persists. There is no significant pleural effusion. The support tubes and lines are in reasonable position radiographically.   Electronically Signed   By: David  [place]   On: 04/08/2014 07:31   Dg Chest Port 1 View  04/07/2014   CLINICAL DATA:  81 year old male with respiratory failure, cardiac arrest, circulatory shock, acute renal injury, Initial encounter.  EXAM: PORTABLE CHEST - 1 VIEW  COMPARISON:  04/06/2016 and earlier.  FINDINGS: Portable AP semi upright view at 0451 hrs. Endotracheal tube remains in satisfactory position, tip between the clavicles and carina. Stable right IJ central line, tip at the level of the superior right atrium given the current lung volumes. Enteric tube courses to the abdomen, tip not included. Resuscitation pads remain.  Stable cardiac size and mediastinal contours. No pneumothorax. Small volume pleural fluid in the right minor fissure. Streaky basilar predominant pulmonary opacity has regressed, with improved visualization of the diaphragm. No areas of worsening ventilation.  IMPRESSION: 1.  Stable lines and tubes. 2. Improved bibasilar ventilation with residual streaky opacity and small volume pleural fluid. No new cardiopulmonary abnormality.   Electronically Signed   By: Augusto Gamble M.D.   On: 04/07/2014 07:18    Medications:  Scheduled: . ampicillin-sulbactam (UNASYN) IV  1.5 g Intravenous Q12H  . antiseptic oral rinse  7 mL Mouth Rinse QID  . aspirin  81 mg Per Tube Daily  . budesonide  0.25 mg Nebulization 4 times per day  . chlorhexidine  15 mL Mouth Rinse BID  . Chlorhexidine Gluconate Cloth  6 each Topical Q0600  . collagenase   Topical Daily  . famotidine  20 mg Per Tube Daily  . feeding supplement (VITAL HIGH PROTEIN)  1,000 mL Per Tube Q24H  . heparin  5,000 Units Subcutaneous 3 times per day  . insulin aspart  0-15 Units Subcutaneous 6 times per day  . ipratropium-albuterol  3 mL Nebulization Q6H  .  levETIRAcetam  500 mg Intravenous Q12H  . mupirocin ointment  1 application Nasal BID    Assessment/Plan:  81 year old male s/p cardiac arrest and hypothermia protocol who after rewarming was noted to have stimulus induced eye twitching. No nystagmus noted. With symptoms being stimulus induced and all medications likely not fully cleared, this may have very well not represented seizure activity. Patient admitted 1/03 with PEA, rewarmed on 1/05, remains unresponsive at this time. Will continue to follow, guarded prognosis for a meaningful recovery.    LOS: 3 days   Elspeth Cho, DO [facility] [phone]  If 7pm- 7am, please page neurology on call as listed in AMION. 04/08/2014  7:53 AM
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
## 2014-04-08 NOTE — Progress Notes (Signed)
PULMONARY / CRITICAL CARE MEDICINE   Name: [name] MRN: [id number] DOB: 1933/06/10    ADMISSION DATE:  April 19, 2014  REFERRING MD :  EDP  CHIEF COMPLAINT:  Cardiac arrest   INITIAL PRESENTATION:  81 yo male SNF pt w/ witnessed cardiac arrest with immediate CPR , initial PEA noted , 25 min CPR w/ 3 epi prior to ER, ROS w/ junctional rhythm . PCCM to admit.   SIGNIFICANT EVENTS/STUDIES:  01/03 Admission via ED post PEA arrest. Arctic Sun protocol initiated 01/03 Echocardiogram: Mild LVH, LVEF 25-30%, Grade 2 diastolic dysfunction, moderately dilated LA, PASP est 42 mmHg 01/04 EEG: diffuse, nonspecific slowing 01/04 Cardiac markers negative 01/05 Rewarmed. Very minimal response to deep pain. Apneic when changed to PSV mode.  01/05 Repeat EEG: diffuse slowing 01/05 CT head: multiple old strokes and encephalomalacia. Mod-severe cerebral atrophy.  01/05 Family conference: DNR if arrests. Continue current support. Reassess daily with plans for withdrawal if no improvement in 24-48 hrs 01/05 Slightly more responsive - minimally so.   INDWELLING DEVICES: ETT 01/03 >>  R IJ CVL 01/03 >>  R radial A-line 01/03 >> 01/06  MICRO DATA: PCT 01/04: 1.86,  01/05: 1.96 Urine 01/03 >> 100k E coli (sens to pip-tazo, imipenem, aminoglycosides) Resp  01/03 >> abundant staph >>  Blood 01/03 >>    ANTIMICROBIALS:  Amp-sulbactam 01/03 >> 01/06 Pip-tazo 01/06 >>    SUBJECTIVE:  Minimally more responsive to deep pain. Remains hypopneic but not apneic  VITAL SIGNS: Temp:  [97.3 F (36.3 C)-98.8 F (37.1 C)] 98.6 F (37 C) (01/06 1000) Pulse Rate:  [69-77] 71 (01/06 0900) Resp:  [12-19] 15 (01/06 0900) BP: (139-160)/(47-50) 160/50 mmHg (01/06 0811) SpO2:  [100 %] 100 % (01/06 0900) Arterial Line BP: (134-156)/(41-51) 156/47 mmHg (01/06 0900) FiO2 (%):  [40 %] 40 % (01/06 0811) HEMODYNAMICS:   VENTILATOR SETTINGS: Vent Mode:  [-] PRVC FiO2 (%):  [40 %] 40 % Set Rate:  [14 bmp] 14  bmp Vt Set:  [540 mL] 540 mL PEEP:  [5 cmH20] 5 cmH20 Plateau Pressure:  [21 cmH20-25 cmH20] 22 cmH20 INTAKE / OUTPUT:  Intake/Output Summary (Last 24 hours) at 04/08/14 1023 Last data filed at 04/08/14 0900  Gross per 24 hour  Intake 1231.67 ml  Output    504 ml  Net 727.67 ml    PHYSICAL EXAMINATION: General: RASS -4, no spont movement, not F/C  Neuro: Pupils react, minimal withdrawal HEENT: NCAT Cardiovascular: reg, soft systolic M Lungs: clear anteriorly Abdomen:  Soft , hypoactive BS  Ext: no edema, cool, severe bilateral heel ulcers   LABS: I have reviewed all of today's lab results. Relevant abnormalities are discussed in the A/P section  CXR: NSC vs slightly improved  ASSESSMENT / PLAN:  PULMONARY A: VDRF post cardiac arrest Pulmonary infiltrates, improved P:   Cont full vent support - settings reviewed and/or adjusted Cont vent bundle Daily SBT if/when meets criteria Continue nebulized BDs Cont nebulized steroids   CARDIOVASCULAR A:  Witnessed PEA arrest  H/O PAF Cardiomyopathy HTN P:  MAP goal > 60 mmHg Holding home antihypertensive medications  RENAL A:   Hyperkalemia, resolved CKD (baseline Cr 1.67) AKI, nonoliguric  Not a candidate for HD P:   Monitor BMET intermittently Monitor I/Os Correct electrolytes as indicated  GASTROINTESTINAL A:   No issues P:   SUP: enteral famotidine Cont TFs  HEMATOLOGIC A:  Anemia without acute blood loss Thrombocytopenia  P:  DVT px: SCDs Monitor CBC intermittently Transfuse per usual  ICU guidelines  INFECTIOUS A:  Concern for aspiration PNA  E coli UTI - highly resistant  P:   Micro and abx as above  ENDOCRINE A:   DM 2, diet controlled prior to admission Hyperglycemia, controlled P:   Cont SSI   NEUROLOGIC A:   Post anoxic encephalopathy Multiple prior CVAs with encephalomalacia Mod-severe cortical atrophy by CT head 01/05 P:   RASS goal: 0 Minimize  sedation/opioids   FAMILY -    CCM X 40 mins  Billy Fischer, MD ; [facility] service Mobile [phone].  After 5:30 PM or weekends, call [phone]

## 2014-04-08 NOTE — Progress Notes (Signed)
Pharmacist Heart Failure Core Measure Documentation  Assessment: [name] has an EF documented as 25-30% on 16-Apr-2014 by ECHO.  Rationale: Heart failure patients with left ventricular systolic dysfunction (LVSD) and an EF < 40% should be prescribed an angiotensin converting enzyme inhibitor (ACEI) or angiotensin receptor blocker (ARB) at discharge unless a contraindication is documented in the medical record.  This patient is not currently on an ACEI or ARB for HF.  This note is being placed in the record in order to provide documentation that a contraindication to the use of these agents is present for this encounter.  ACE Inhibitor or Angiotensin Receptor Blocker is contraindicated (specify all that apply)  []   ACEI allergy AND ARB allergy []   Angioedema []   Moderate or severe aortic stenosis []   Hyperkalemia []   Hypotension []   Renal artery stenosis [x]   Worsening renal function, preexisting renal disease or dysfunction  Milas Schappell, Gwenlyn Found 04/08/2014 1:50 PM

## 2014-04-08 NOTE — Progress Notes (Addendum)
ANTIBIOTIC CONSULT NOTE - FOLLOW UP  Pharmacy Consult for change Unasyn to Zosyn Indication: aspiration PNA, Ecoli UTI  No Known Allergies  Patient Measurements: Height:  (172.7 cm) Weight: 165 lb 9.1 oz (75.1 kg) IBW/kg (Calculated) : 68.4 Adjusted Body Weight: n/a  Vital Signs: Temp: 98.5 F (36.9 C) (01/06 1156) Temp Source: Oral (01/06 1156) BP: 160/50 mmHg (01/06 0811) Pulse Rate: 70 (01/06 1156) Intake/Output from previous day: 01/05 0701 - 01/06 0700 In: 1161.7 [I.V.:225; NG/GT:536.7; IV Piggyback:400] Out: 529 [Urine:529] Intake/Output from this shift: Total I/O In: 250 [I.V.:50; NG/GT:200] Out: 75 [Urine:75]  Labs:  Recent Labs  04/18/2014 2027  04/06/14 2300 04/07/14 0350 04/08/14 0410  WBC  --   --   --  4.4 3.8*  HGB 8.8*  --   --  7.4* 7.4*  PLT  --   --   --  89* 86*  CREATININE 2.00*  < > 2.13* 2.18* 2.15*  < > = values in this interval not displayed. Estimated Creatinine Clearance: 27.4 mL/min (by C-G formula based on Cr of 2.15). No results for input(s): VANCOTROUGH, VANCOPEAK, VANCORANDOM, GENTTROUGH, GENTPEAK, GENTRANDOM, TOBRATROUGH, TOBRAPEAK, TOBRARND, AMIKACINPEAK, AMIKACINTROU, AMIKACIN in the last 72 hours.   Microbiology: Recent Results (from the past 720 hour(s))  Urine culture     Status: None   Collection Time: 04-18-2014 11:47 AM  Result Value Ref Range Status   Specimen Description URINE, CATHETERIZED  Final   Special Requests NONE  Final   Colony Count   Final    >=100,000 COLONIES/ML Performed at [facility]    Culture   Final    ESCHERICHIA COLI Note: Confirmed Extended Spectrum Beta-Lactamase Producer (ESBL) CRITICAL RESULT CALLED TO, READ BACK BY AND VERIFIED WITH: CAROLINE FOWLER AT 3:56 A.M. ON 04/08/2013 WARRB Performed at [facility]    Report Status 04/08/2014 FINAL  Final   Organism ID, Bacteria ESCHERICHIA COLI  Final      Susceptibility   Escherichia coli - MIC*    AMPICILLIN >=32 RESISTANT  Resistant     CEFAZOLIN >=64 RESISTANT Resistant     CEFTRIAXONE >=64 RESISTANT Resistant     CIPROFLOXACIN >=4 RESISTANT Resistant     GENTAMICIN <=1 SENSITIVE Sensitive     LEVOFLOXACIN >=8 RESISTANT Resistant     NITROFURANTOIN <=16 SENSITIVE Sensitive     TOBRAMYCIN <=1 SENSITIVE Sensitive     TRIMETH/SULFA >=320 RESISTANT Resistant     IMIPENEM <=0.25 SENSITIVE Sensitive     PIP/TAZO <=4 SENSITIVE Sensitive     * ESCHERICHIA COLI  MRSA PCR Screening     Status: Abnormal   Collection Time: April 18, 2014  1:58 PM  Result Value Ref Range Status   MRSA by PCR POSITIVE (A) NEGATIVE Final    Comment:        The GeneXpert MRSA Assay (FDA approved for NASAL specimens only), is one component of a comprehensive MRSA colonization surveillance program. It is not intended to diagnose MRSA infection nor to guide or monitor treatment for MRSA infections. RESULT CALLED TO, READ BACK BY AND VERIFIED WITH: L.SHORT 04-18-14 1620 BY V.WILKINS   Culture, blood (routine x 2)     Status: None (Preliminary result)   Collection Time: 2014/04/18  2:10 PM  Result Value Ref Range Status   Specimen Description BLOOD LEFT ARM  Final   Special Requests BOTTLES DRAWN AEROBIC ONLY 5CC  Final   Culture   Final           BLOOD CULTURE  RECEIVED NO GROWTH TO DATE CULTURE WILL BE HELD FOR 5 DAYS BEFORE ISSUING A FINAL NEGATIVE REPORT Performed at [facility]    Report Status PENDING  Incomplete  Culture, blood (routine x 2)     Status: None (Preliminary result)   Collection Time: 04/09/2014  2:30 PM  Result Value Ref Range Status   Specimen Description BLOOD LEFT ARM  Final   Special Requests BOTTLES DRAWN AEROBIC ONLY 3CC  Final   Culture   Final           BLOOD CULTURE RECEIVED NO GROWTH TO DATE CULTURE WILL BE HELD FOR 5 DAYS BEFORE ISSUING A FINAL NEGATIVE REPORT Note: Culture results may be compromised due to an inadequate volume of blood received in culture bottles. Performed at [facility]    Report Status PENDING  Incomplete  Culture, respiratory (NON-Expectorated)     Status: None (Preliminary result)   Collection Time: 05/02/2014  4:19 PM  Result Value Ref Range Status   Specimen Description TRACHEAL ASPIRATE  Final   Special Requests NONE  Final   Gram Stain   Final    NO WBC SEEN NO SQUAMOUS EPITHELIAL CELLS SEEN RARE GRAM NEGATIVE RODS RARE GRAM POSITIVE COCCI IN PAIRS Performed at [facility]    Culture   Final    ABUNDANT STAPHYLOCOCCUS AUREUS Note: RIFAMPIN AND GENTAMICIN SHOULD NOT BE USED AS SINGLE DRUGS FOR TREATMENT OF STAPH INFECTIONS. Performed at [facility]    Report Status PENDING  Incomplete    Anti-infectives    Start     Dose/Rate Route Frequency Ordered Stop   04/08/14 1400  piperacillin-tazobactam (ZOSYN) IVPB 3.375 g     3.375 g12.5 mL/hr over 240 Minutes Intravenous 3 times per day 04/08/14 1051     04/04/2014 1145  ampicillin-sulbactam (UNASYN) 1.5 g in sodium chloride 0.9 % 50 mL IVPB  Status:  Discontinued     1.5 g100 mL/hr over 30 Minutes Intravenous Every 12 hours 04/20/2014 1139 04/08/14 1035      Assessment: 81 yo male on Unasyn for aspiration PNA.  Pharmacy asked to change to Zosyn today given culture results with drug-resistant Ecoli in Urine.  Also growing abundant staph in respiratory culture.  Estimated CrCl ~ 27 ml/min  Goal of Therapy:  Resolution of infection  Plan:  1. Zosyn 3.375g IV q 8 hrs (4 hr extended interval infusion) 2. F/u cultures and renal function.  Tad Moore, BCPS  Clinical Pharmacist Pager [phone]  04/08/2014 1:48 PM   Addendum: Respiratory culture just resulted with abundant MRSA.  Called [facility] and spoke with Dr. Dema Severin - order with vancomycin received. Plan: Vancomycin 1g IV x 1 dose, then 750mg  IV q24 Continue to monitor renal function  Celedonio Miyamoto, PharmD, BCPS Clinical Pharmacist Pager [phone]

## 2014-04-09 ENCOUNTER — Other Ambulatory Visit (HOSPITAL_COMMUNITY): Payer: Medicare Other

## 2014-04-09 ENCOUNTER — Ambulatory Visit: Payer: Medicare Other | Admitting: Vascular Surgery

## 2014-04-09 DIAGNOSIS — E43 Unspecified severe protein-calorie malnutrition: Secondary | ICD-10-CM

## 2014-04-09 LAB — GLUCOSE, CAPILLARY
GLUCOSE-CAPILLARY: 176 mg/dL — AB (ref 70–99)
GLUCOSE-CAPILLARY: 213 mg/dL — AB (ref 70–99)
GLUCOSE-CAPILLARY: 180 mg/dL — AB (ref 70–99)
Glucose-Capillary: 173 mg/dL — ABNORMAL HIGH (ref 70–99)
Glucose-Capillary: 185 mg/dL — ABNORMAL HIGH (ref 70–99)
Glucose-Capillary: 186 mg/dL — ABNORMAL HIGH (ref 70–99)
Glucose-Capillary: 208 mg/dL — ABNORMAL HIGH (ref 70–99)

## 2014-04-09 LAB — CBC
HEMATOCRIT: 25.1 % — AB (ref 39.0–52.0)
HEMOGLOBIN: 7.7 g/dL — AB (ref 13.0–17.0)
MCH: 25.2 pg — AB (ref 26.0–34.0)
MCHC: 30.7 g/dL (ref 30.0–36.0)
MCV: 82.3 fL (ref 78.0–100.0)
Platelets: 98 10*3/uL — ABNORMAL LOW (ref 150–400)
RBC: 3.05 MIL/uL — AB (ref 4.22–5.81)
RDW: 18.1 % — ABNORMAL HIGH (ref 11.5–15.5)
WBC: 5.8 10*3/uL (ref 4.0–10.5)

## 2014-04-09 LAB — BASIC METABOLIC PANEL
Anion gap: 6 (ref 5–15)
BUN: 73 mg/dL — ABNORMAL HIGH (ref 6–23)
CO2: 24 mmol/L (ref 19–32)
Calcium: 8 mg/dL — ABNORMAL LOW (ref 8.4–10.5)
Chloride: 112 mEq/L (ref 96–112)
Creatinine, Ser: 2.11 mg/dL — ABNORMAL HIGH (ref 0.50–1.35)
GFR calc Af Amer: 33 mL/min — ABNORMAL LOW (ref >90)
GFR calc non Af Amer: 28 mL/min — ABNORMAL LOW (ref >90)
GLUCOSE: 215 mg/dL — AB (ref 70–99)
POTASSIUM: 4.5 mmol/L (ref 3.5–5.1)
SODIUM: 142 mmol/L (ref 135–145)

## 2014-04-09 LAB — CLOSTRIDIUM DIFFICILE BY PCR: Toxigenic C. Difficile by PCR: NEGATIVE

## 2014-04-09 MED ORDER — FREE WATER
200.0000 mL | Freq: Three times a day (TID) | Status: DC
  Administered 2014-04-09 – 2014-04-14 (×15): 200 mL

## 2014-04-09 NOTE — Progress Notes (Signed)
Inpatient Diabetes Program Recommendations  AACE/ADA: New Consensus Statement on Inpatient Glycemic Control (2013)  Target Ranges:  Prepandial:   less than 140 mg/dL      Peak postprandial:   less than 180 mg/dL (1-2 hours)      Critically ill patients:  140 - 180 mg/dL   Inpatient Diabetes Program Recommendations Insulin - Meal Coverage: add Novolog 4 units Q4 as scheduled tube feed coverage (should not be given if tube feeds held or interrupted for any reason) Thank you  Piedad Climes BSN, RN,CDE Inpatient Diabetes Coordinator [phone] (team pager)

## 2014-04-09 NOTE — Progress Notes (Signed)
RT found pt ETT to be 21 at the lip. RT advanced tube back to 25 at the lip.

## 2014-04-09 NOTE — Progress Notes (Addendum)
PULMONARY / CRITICAL CARE MEDICINE   Name: [name] MRN: [id number] DOB: Jun 19, 1933    ADMISSION DATE:  04/12/2014  REFERRING MD :  EDP  CHIEF COMPLAINT:  Cardiac arrest   INITIAL PRESENTATION:  81 yo male SN[facility] pt w/ witnessed cardiac arrest with immediate CPR , initial PEA noted , 25 min CPR w/ 3 epi prior to ER, ROS w/ junctional rhythm . PCCM to admit.   SIGNIFICANT EVENTS/STUDIES:  01/03 Admission via ED post PEA arrest. [place] protocol initiated 01/03 Echocardiogram: Mild LVH, LVEF 25-30%, Grade 2 diastolic dysfunction, moderately dilated LA, PASP est 42 mmHg 01/04 EEG: diffuse, nonspecific slowing 01/04 Cardiac markers negative 01/05 Rewarmed. Very minimal response to deep pain. Apneic when changed to PSV mode.  01/05 Repeat EEG: diffuse slowing 01/05 CT head: multiple old strokes and encephalomalacia. Mod-severe cerebral atrophy.  01/05 Family conference: DNR if arrests. Continue current support. Reassess daily with plans for withdrawal if no improvement in 24-48 hrs 01/06 Slightly more responsive - minimally so.  01/07 Slightly more responsive but still not F/C and not requiring any sedation or analgesia.   INDWELLING DEVICES: ETT 01/03 >>  R IJ CVL 01/03 >>  R radial A-line 01/03 >> 01/06  MICRO DATA: PCT 01/04: 1.86,  01/05: 1.96 Urine 01/03 >> 100k E coli (sens to pip-tazo, imipenem, aminoglycosides) Resp  01/03 >> abundant MRSA Blood 01/03 >>    ANTIMICROBIALS:  Amp-sulbactam 01/03 >> 01/06 Pip-tazo 01/06 >>    SUBJECTIVE: Remains minimally responsive but slightly more responsive than 01/06. Tolerates PS 10 cm H2O  VITAL SIGNS: Temp:  [96.7 F (35.9 C)-99.1 F (37.3 C)] 96.7 F (35.9 C) (01/07 1600) Pulse Rate:  [61-73] 65 (01/07 1600) Resp:  [13-19] 17 (01/07 1600) BP: (122-149)/(43-58) 131/46 mmHg (01/07 1600) SpO2:  [99 %-100 %] 100 % (01/07 1600) FiO2 (%):  [40 %] 40 % (01/07 1625) Weight:  [76.3 kg (168 lb 3.4 oz)] 76.3 kg (168 lb 3.4  oz) (01/07 0200) HEMODYNAMICS:   VENTILATOR SETTINGS: Vent Mode:  [-] PRVC FiO2 (%):  [40 %] 40 % Set Rate:  [14 bmp] 14 bmp Vt Set:  [500 mL] 500 mL PEEP:  [5 cmH20] 5 cmH20 Pressure Support:  [5 cmH20-10 cmH20] 10 cmH20 Plateau Pressure:  [14 cmH20-22 cmH20] 20 cmH20 INTAKE / OUTPUT:  Intake/Output Summary (Last 24 hours) at 04/09/14 1713 Last data filed at 04/09/14 1625  Gross per 24 hour  Intake 2317.5 ml  Output    800 ml  Net 1517.5 ml    PHYSICAL EXAMINATION: General: RASS -3, minimal spont movement, not F/C  Neuro: Pupils react, some withdrawal from pain HEENT: NCAT Cardiovascular: reg, soft systolic M Lungs: clear anteriorly Abdomen:  Soft , hypoactive BS  Ext: no edema, cool, severe bilateral heel ulcers   LABS: I have reviewed all of today's lab results. Relevant abnormalities are discussed in the A/P section  CXR: NNF  ASSESSMENT / PLAN:  PULMONARY A: VDRF post cardiac arrest Pulmonary infiltrates, improved P:   Cont vent support - settings reviewed and/or adjusted Wean in PSV as tolerated Cont vent bundle Daily SBT if/when meets criteria Continue nebulized BDs Cont nebulized steroids   CARDIOVASCULAR A:  Witnessed PEA arrest  H/O PAF Cardiomyopathy HTN P:  MAP goal > 60 mmHg Holding home antihypertensive medications DNR if arrests again  RENAL A:   Hyperkalemia, resolved CKD (baseline Cr 1.67) AKI, nonoliguric  Not a candidate for HD P:   Monitor BMET intermittently Monitor I/Os Correct  electrolytes as indicated Add free water 01/07  GASTROINTESTINAL A:   Protein-calorie malnutrition P:   SUP: enteral famotidine Cont TFs  HEMATOLOGIC A:  Anemia without acute blood loss Thrombocytopenia, etiology unclear P:  DVT px: SCDs Monitor CBC intermittently Transfuse per usual ICU guidelines  INFECTIOUS A:  Concern for aspiration PNA  E coli UTI - highly resistant  P:   Micro and abx as above  ENDOCRINE A:   DM 2,  diet controlled prior to admission Hyperglycemia, controlled P:   Cont SSI   NEUROLOGIC A:   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 We are working on end points. I encouraged that we get things "as good as possible" with plan for one way extubation. Son, Loraine Leriche, in particular is having difficulty accepting the poor prognosis. Chaplain services were requested   CCM X 50 mins  Billy Fischer, MD ; [facility] [phone].  After 5:30 PM or weekends, call [phone]

## 2014-04-09 NOTE — Progress Notes (Signed)
Subjective: No overnight events. No further questionable seizure activity.   CT head imaging reviewed, shows no acute process  Objective: Current vital signs: BP 137/53 mmHg  Pulse 68  Temp(Src) 98.6 F (37 C) (Core (Comment))  Resp 14  Ht 5\' 8"  (1.727 m)  Wt 76.3 kg (168 lb 3.4 oz)  BMI 25.58 kg/m2  SpO2 100% Vital signs in last 24 hours: Temp:  [98.5 F (36.9 C)-99.1 F (37.3 C)] 98.6 F (37 C) (01/07 0400) Pulse Rate:  [65-73] 68 (01/07 0700) Resp:  [13-19] 14 (01/07 0700) BP: (122-160)/(44-58) 137/53 mmHg (01/07 0700) SpO2:  [100 %] 100 % (01/07 0700) Arterial Line BP: (154-156)/(47-48) 156/48 mmHg (01/06 1100) FiO2 (%):  [40 %] 40 % (01/07 0321) Weight:  [76.3 kg (168 lb 3.4 oz)] 76.3 kg (168 lb 3.4 oz) (01/07 0200)  Intake/Output from previous day: 01/06 0701 - 01/07 0700 In: 1660 [I.V.:200; NG/GT:1335; IV Piggyback:125] Out: 550 [Urine:550] Intake/Output this shift:   Nutritional status:    Neurologic Exam: Mental Status: Patient does not respond to verbal stimuli. Does not respond to deep sternal rub. Does not follow commands. No verbalizations noted.  Cranial Nerves: II: patient does not respond confrontation bilaterally, pupils right 3 mm, left 3 mm,and reactive bilaterally III,IV,VI: doll's response present bilaterally. No nystagmus noted. V,VII: corneal reflex absent bilaterally  VIII: patient does not respond to verbal stimuli Motor: Extremities flaccid throughout. No spontaneous movement noted of the extremities. No purposeful movements noted of the extremities. Sensory: Does not respond to noxious stimuli in any extremity. Deep Tendon Reflexes:  2+ in the upper extremities and absent in the lower extremities Plantars: upgoing on the right and mute on the left Cerebellar: Unable to perform  Lab Results: Basic Metabolic Panel:  Recent Labs Lab 04/06/14 0307  04/06/14 1800 04/06/14 2300 04/07/14 0350 04/08/14 0410 04/09/14 0430  NA  141  < > 138 137 141 143 142  K 4.2  < > 4.0 4.2 4.4 4.3 4.5  CL 110  < > 109 108 110 113* 112  CO2 24  < > 22 22 23 23 24   GLUCOSE 162*  < > 126* 137* 138* 155* 215*  BUN 57*  < > 55* 57* 56* 61* 73*  CREATININE 2.11*  < > 2.11* 2.13* 2.18* 2.15* 2.11*  CALCIUM 8.1*  < > 7.8* 7.9* 7.9* 7.9* 8.0*  MG 2.0  --   --   --   --   --   --   PHOS 2.7  --   --   --   --   --   --   < > = values in this interval not displayed.  Liver Function Tests:  Recent Labs Lab 04/07/14 0350 04/08/14 0410  AST 37 20  ALT 80* 55*  ALKPHOS 50 49  BILITOT 0.9 0.9  PROT 4.7* 4.5*  ALBUMIN 1.6* 1.6*   No results for input(s): LIPASE, AMYLASE in the last 168 hours. No results for input(s): AMMONIA in the last 168 hours.  CBC:  Recent Labs Lab 2014-04-10 0930  2014/04/10 1052  04-10-2014 1859 04/10/14 2027 04/07/14 0350 04/08/14 0410 04/09/14 0430  WBC 10.0  --  9.0  --   --   --  4.4 3.8* 5.8  NEUTROABS 8.6*  --   --   --   --   --   --   --   --   HGB 8.1*  < > 7.5*  < > 8.5* 8.8* 7.4* 7.4*  7.7*  HCT 27.1*  < > 25.2*  < > 25.0* 26.0* 23.9* 23.7* 25.1*  MCV 88.6  --  85.4  --   --   --  81.6 83.2 82.3  PLT 162  --  123*  --   --   --  89* 86* 98*  < > = values in this interval not displayed.  Cardiac Enzymes:  Recent Labs Lab 04/06/2014 0930 04/24/2014 1052 04/19/2014 1652 04/23/2014 2310 04/06/14 0421  TROPONINI 0.09* 0.08* 0.12* 0.12* 0.10*    Lipid Panel: No results for input(s): CHOL, TRIG, HDL, CHOLHDL, VLDL, LDLCALC in the last 168 hours.  CBG:  Recent Labs Lab 04/08/14 0724 04/08/14 1153 04/08/14 1614 04/09/14 0012 04/09/14 0428  GLUCAP 157* 192* 227* 186* 208*    Microbiology: Results for orders placed or performed during the hospital encounter of 04/25/2014  Urine culture     Status: None   Collection Time: 04/12/2014 11:47 AM  Result Value Ref Range Status   Specimen Description URINE, CATHETERIZED  Final   Special Requests NONE  Final   Colony Count   Final     >=100,000 COLONIES/ML Performed at [facility]    Culture   Final    ESCHERICHIA COLI Note: Confirmed Extended Spectrum Beta-Lactamase Producer (ESBL) CRITICAL RESULT CALLED TO, READ BACK BY AND VERIFIED WITH: CAROLINE FOWLER AT 3:56 A.M. ON 04/08/2013 WARRB Performed at [facility]    Report Status 04/08/2014 FINAL  Final   Organism ID, Bacteria ESCHERICHIA COLI  Final      Susceptibility   Escherichia coli - MIC*    AMPICILLIN >=32 RESISTANT Resistant     CEFAZOLIN >=64 RESISTANT Resistant     CEFTRIAXONE >=64 RESISTANT Resistant     CIPROFLOXACIN >=4 RESISTANT Resistant     GENTAMICIN <=1 SENSITIVE Sensitive     LEVOFLOXACIN >=8 RESISTANT Resistant     NITROFURANTOIN <=16 SENSITIVE Sensitive     TOBRAMYCIN <=1 SENSITIVE Sensitive     TRIMETH/SULFA >=320 RESISTANT Resistant     IMIPENEM <=0.25 SENSITIVE Sensitive     PIP/TAZO <=4 SENSITIVE Sensitive     * ESCHERICHIA COLI  MRSA PCR Screening     Status: Abnormal   Collection Time: 04/16/2014  1:58 PM  Result Value Ref Range Status   MRSA by PCR POSITIVE (A) NEGATIVE Final    Comment:        The GeneXpert MRSA Assay (FDA approved for NASAL specimens only), is one component of a comprehensive MRSA colonization surveillance program. It is not intended to diagnose MRSA infection nor to guide or monitor treatment for MRSA infections. RESULT CALLED TO, READ BACK BY AND VERIFIED WITH: L.SHORT 04/10/2014 1620 BY V.WILKINS   Culture, blood (routine x 2)     Status: None (Preliminary result)   Collection Time: 04/10/2014  2:10 PM  Result Value Ref Range Status   Specimen Description BLOOD LEFT ARM  Final   Special Requests BOTTLES DRAWN AEROBIC ONLY 5CC  Final   Culture   Final           BLOOD CULTURE RECEIVED NO GROWTH TO DATE CULTURE WILL BE HELD FOR 5 DAYS BEFORE ISSUING A FINAL NEGATIVE REPORT Performed at [facility]    Report Status PENDING  Incomplete  Culture, blood (routine x 2)     Status: None  (Preliminary result)   Collection Time: 04/22/2014  2:30 PM  Result Value Ref Range Status   Specimen Description BLOOD LEFT ARM  Final  Special Requests BOTTLES DRAWN AEROBIC ONLY 3CC  Final   Culture   Final           BLOOD CULTURE RECEIVED NO GROWTH TO DATE CULTURE WILL BE HELD FOR 5 DAYS BEFORE ISSUING A FINAL NEGATIVE REPORT Note: Culture results may be compromised due to an inadequate volume of blood received in culture bottles. Performed at [facility]    Report Status PENDING  Incomplete  Culture, respiratory (NON-Expectorated)     Status: None   Collection Time: 04/07/2014  4:19 PM  Result Value Ref Range Status   Specimen Description TRACHEAL ASPIRATE  Final   Special Requests NONE  Final   Gram Stain   Final    NO WBC SEEN NO SQUAMOUS EPITHELIAL CELLS SEEN RARE GRAM NEGATIVE RODS RARE GRAM POSITIVE COCCI IN PAIRS Performed at [facility]    Culture   Final    ABUNDANT METHICILLIN RESISTANT STAPHYLOCOCCUS AUREUS Note: RIFAMPIN AND GENTAMICIN SHOULD NOT BE USED AS SINGLE DRUGS FOR TREATMENT OF STAPH INFECTIONS. CRITICAL RESULT CALLED TO, READ BACK BY AND VERIFIED WITH: MARY N 04/08/14  @300  BY REAMM Performed at [facility]    Report Status 04/08/2014 FINAL  Final   Organism ID, Bacteria METHICILLIN RESISTANT STAPHYLOCOCCUS AUREUS  Final      Susceptibility   Methicillin resistant staphylococcus aureus - MIC*    CLINDAMYCIN >=8 RESISTANT Resistant     ERYTHROMYCIN >=8 RESISTANT Resistant     GENTAMICIN <=0.5 SENSITIVE Sensitive     LEVOFLOXACIN >=8 RESISTANT Resistant     OXACILLIN >=4 RESISTANT Resistant     PENICILLIN >=0.5 RESISTANT Resistant     RIFAMPIN <=0.5 SENSITIVE Sensitive     TRIMETH/SULFA <=10 SENSITIVE Sensitive     VANCOMYCIN <=0.5 SENSITIVE Sensitive     TETRACYCLINE >=16 RESISTANT Resistant     * ABUNDANT METHICILLIN RESISTANT STAPHYLOCOCCUS AUREUS    Coagulation Studies: No results for input(s): LABPROT, INR in the last 72  hours.  Imaging: Ct Head Wo Contrast  04/07/2014   CLINICAL DATA:  Anoxic encephalopathy related to cardiorespiratory arrest. Current history of diabetes and hypertension.  EXAM: CT HEAD WITHOUT CONTRAST  TECHNIQUE: Contiguous axial images were obtained from the base of the skull through the vertex without intravenous contrast.  COMPARISON:  Multiple prior CT head examinations dating back to 05/30/2007, most recently 11/04/2013.  FINDINGS: Moderate to severe cortical atrophy and mild deep atrophy, progressive since 2009, unchanged since the most recent examination 11/2013. Severe changes of small vessel disease of the white matter diffusely, unchanged. Encephalomalacia in the left occipital lobe, increased since the 11/2013 examination. Old focal left superior cerebellar stroke, unchanged. No new/acute cortical stroke. No acute hemorrhage or hematoma. No extra-axial fluid collections. No evidence of generalized cerebral edema at this time.  No skull fracture or other focal osseous abnormality involving the skull. Small amount of fluid in the inferior mastoid air cells bilaterally. Both middle ear cavities well-aerated. Visualized paranasal sinuses well aerated. Extensive bilateral carotid siphon left vertebral artery atherosclerosis.  IMPRESSION: 1. No acute intracranial abnormality. Specifically, no evidence of generalized cerebral edema. 2. Old cortical stroke involving the left occipital lobe, with increase in the area of encephalomalacia in the left occipital lobe since 11/2013. 3. Old focal left superior cerebellar hemispheric stroke. 4. Moderate to severe cortical atrophy, mild deep atrophy and severe chronic microvascular ischemic changes of the white matter. 5. Small bilateral mastoid effusions.   Electronically Signed   By: Kayren Eaves.D.  On: 04/07/2014 13:10   Dg Chest Port 1 View  04/08/2014   CLINICAL DATA:  Respiratory failure, shortness of breath  EXAM: PORTABLE CHEST - 1 VIEW   COMPARISON:  Portable chest x-ray of January 5th 2016 and April 05, 2014  FINDINGS: The lungs are adequately inflated. There are patchy increased densities at both bases consistent with subsegmental atelectasis. The cardiac silhouette is mildly enlarged. The pulmonary vascularity is not engorged. External pacemaker defibrillator pads are present. The endotracheal tube tip projects 3.8 cm above the crotch of the carina. The esophagogastric tube tip projects below the inferior margin of the image. The right internal jugular venous catheter tip projects over the cavoatrial junction.  IMPRESSION: There has been further interval improvement in the appearance of the lung bases. Minimal subsegmental atelectasis persists. There is no significant pleural effusion. The support tubes and lines are in reasonable position radiographically.   Electronically Signed   By: David  [place]   On: 04/08/2014 07:31    Medications:  Scheduled: . antiseptic oral rinse  7 mL Mouth Rinse QID  . aspirin  81 mg Per Tube Daily  . budesonide  0.25 mg Nebulization 4 times per day  . chlorhexidine  15 mL Mouth Rinse BID  . collagenase   Topical Daily  . famotidine  20 mg Per Tube Daily  . feeding supplement (VITAL HIGH PROTEIN)  1,000 mL Per Tube Q24H  . insulin aspart  0-15 Units Subcutaneous 6 times per day  . ipratropium-albuterol  3 mL Nebulization Q6H  . mupirocin ointment  1 application Nasal BID  . piperacillin-tazobactam (ZOSYN)  IV  3.375 g Intravenous 3 times per day  . vancomycin  750 mg Intravenous Q24H    Assessment/Plan:  81 year old male s/p cardiac arrest and hypothermia protocol who after rewarming was noted to have stimulus induced eye twitching. No nystagmus noted. With symptoms being stimulus induced and all medications likely not fully cleared, this may have very well not represented seizure activity. Patient admitted 1/03 with PEA, rewarmed on 1/05, remains unresponsive at this time. Guarded prognosis  for a meaningful recovery. Will sign off at this time, please call with further questions.    LOS: 4 days   Elspeth Cho, DO [facility] [phone]  If 7pm- 7am, please page neurology on call as listed in AMION. 04/09/2014  7:34 AM

## 2014-04-10 LAB — GLUCOSE, CAPILLARY
Glucose-Capillary: 160 mg/dL — ABNORMAL HIGH (ref 70–99)
Glucose-Capillary: 198 mg/dL — ABNORMAL HIGH (ref 70–99)
Glucose-Capillary: 148 mg/dL — ABNORMAL HIGH (ref 70–99)
Glucose-Capillary: 149 mg/dL — ABNORMAL HIGH (ref 70–99)
Glucose-Capillary: 140 mg/dL — ABNORMAL HIGH (ref 70–99)

## 2014-04-10 LAB — COMPREHENSIVE METABOLIC PANEL
ALK PHOS: 60 U/L (ref 39–117)
ALT: 48 U/L (ref 0–53)
AST: 31 U/L (ref 0–37)
Albumin: 1.7 g/dL — ABNORMAL LOW (ref 3.5–5.2)
Anion gap: 6 (ref 5–15)
BUN: 82 mg/dL — ABNORMAL HIGH (ref 6–23)
CO2: 25 mmol/L (ref 19–32)
Calcium: 7.9 mg/dL — ABNORMAL LOW (ref 8.4–10.5)
Chloride: 112 mEq/L (ref 96–112)
Creatinine, Ser: 1.98 mg/dL — ABNORMAL HIGH (ref 0.50–1.35)
GFR calc Af Amer: 35 mL/min — ABNORMAL LOW (ref >90)
GFR calc non Af Amer: 31 mL/min — ABNORMAL LOW (ref >90)
Glucose, Bld: 160 mg/dL — ABNORMAL HIGH (ref 70–99)
Potassium: 4.3 mmol/L (ref 3.5–5.1)
SODIUM: 143 mmol/L (ref 135–145)
TOTAL PROTEIN: 4.8 g/dL — AB (ref 6.0–8.3)
Total Bilirubin: 0.5 mg/dL (ref 0.3–1.2)

## 2014-04-10 LAB — CBC
HCT: 24.4 % — ABNORMAL LOW (ref 39.0–52.0)
Hemoglobin: 7.4 g/dL — ABNORMAL LOW (ref 13.0–17.0)
MCH: 25 pg — ABNORMAL LOW (ref 26.0–34.0)
MCHC: 30.3 g/dL (ref 30.0–36.0)
MCV: 82.4 fL (ref 78.0–100.0)
PLATELETS: 88 10*3/uL — AB (ref 150–400)
RBC: 2.96 MIL/uL — AB (ref 4.22–5.81)
RDW: 18.3 % — AB (ref 11.5–15.5)
WBC: 5.7 10*3/uL (ref 4.0–10.5)

## 2014-04-10 NOTE — Progress Notes (Signed)
PULMONARY / CRITICAL CARE MEDICINE   Name: [name] MRN: [id number] DOB: [birth year]    ADMISSION DATE:  04/16/2014  REFERRING MD :  EDP  CHIEF COMPLAINT:  Cardiac arrest   INITIAL PRESENTATION:  81 yo male SNF pt w/ witnessed cardiac arrest with immediate CPR , initial PEA noted , 25 min CPR w/ 3 epi prior to ER, ROS w/ junctional rhythm . PCCM to admit.   SIGNIFICANT EVENTS/STUDIES:  01/03 Admission via ED post PEA arrest. [facility] [place] protocol initiated 01/03 Echocardiogram: Mild LVH, LVEF 25-30%, Grade 2 diastolic dysfunction, moderately dilated LA, PASP est 42 mmHg 01/04 EEG: diffuse, nonspecific slowing 01/04 Cardiac markers negative 01/05 Rewarmed. Very minimal response to deep pain. Apneic when changed to PSV mode.  01/05 Repeat EEG: diffuse slowing 01/05 CT head: multiple old strokes and encephalomalacia. Mod-severe cerebral atrophy.  01/05 Family conference: DNR if arrests. Continue current support. Reassess daily with plans for withdrawal if no improvement in 24-48 hrs 01/06 Slightly more responsive - minimally so.  01/07 Slightly more responsive but still not F/C and not requiring any sedation or analgesia.  01/08 Little change. Tolerated PS 10 cm H2O but poor cognition prohibits extubation  INDWELLING DEVICES: ETT 01/03 >>  R IJ CVL 01/03 >>  R radial A-line 01/03 >> 01/06  MICRO DATA: PCT 01/04: 1.86,  01/05: 1.96 Urine 01/03 >> 100k E coli (sens to pip-tazo, imipenem, aminoglycosides) Resp  01/03 >> abundant MRSA Blood 01/03 >> NEG   ANTIMICROBIALS:  Amp-sulbactam 01/03 >> 01/06 Pip-tazo 01/06 >>  Vanc 01/07 >>   SUBJECTIVE: Remains minimally responsive. Tolerates PS 10 cm H2O  VITAL SIGNS: Temp:  [96.7 F (35.9 C)-98.4 F (36.9 C)] 97.7 F (36.5 C) (01/08 1200) Pulse Rate:  [58-73] 68 (01/08 1200) Resp:  [13-22] 18 (01/08 1200) BP: (127-149)/(40-51) 127/40 mmHg (01/08 1200) SpO2:  [99 %-100 %] 100 % (01/08 1200) FiO2 (%):  [40 %] 40 % (01/08  1200) HEMODYNAMICS:   VENTILATOR SETTINGS: Vent Mode:  [-] CPAP;PSV FiO2 (%):  [40 %] 40 % Set Rate:  [14 bmp] 14 bmp Vt Set:  [500 mL] 500 mL PEEP:  [5 cmH20] 5 cmH20 Pressure Support:  [10 cmH20] 10 cmH20 Plateau Pressure:  [15 cmH20] 15 cmH20 INTAKE / OUTPUT:  Intake/Output Summary (Last 24 hours) at 04/10/14 1428 Last data filed at 04/10/14 1200  Gross per 24 hour  Intake 1747.5 ml  Output   1375 ml  Net  372.5 ml    PHYSICAL EXAMINATION: General: RASS -3, minimal spont movement, not F/C  Neuro: Pupils react, some withdrawal from pain HEENT: NCAT Cardiovascular: reg, soft systolic M Lungs: clear anteriorly Abdomen:  Soft , hypoactive BS  Ext: no edema, cool, severe bilateral heel ulcers   LABS: I have reviewed all of today's lab results. Relevant abnormalities are discussed in the A/P section  CXR: NNF  ASSESSMENT / PLAN:  PULMONARY A: VDRF post cardiac arrest Pulmonary infiltrates, improved P:   Cont vent support - settings reviewed and/or adjusted Wean in PSV as tolerated Cont vent bundle Daily SBT if/when meets criteria Continue nebulized BDs Cont nebulized steroids   CARDIOVASCULAR A:  S/P prolonged PEA arrest  H/O PAF Cardiomyopathy (LVEF 25-30%) HTN P:  MAP goal > 60 mmHg Holding home antihypertensive medications DNR if arrests again  RENAL A:   Hyperkalemia, resolved CKD (baseline Cr 1.67) AKI, nonoliguric  Not a candidate for HD P:   Monitor BMET intermittently Monitor I/Os Correct electrolytes as indicated  GASTROINTESTINAL  A:   Protein-calorie malnutrition P:   SUP: enteral famotidine Cont TFs  HEMATOLOGIC A:  Anemia without overt blood loss Thrombocytopenia, etiology unclear P:  DVT px: SCDs Monitor CBC intermittently Transfuse per usual ICU guidelines  INFECTIOUS A:  Suspect aspiration PNA  E coli UTI - highly resistant  P:   Micro and abx as above  ENDOCRINE A:   DM 2, diet controlled prior to  admission Hyperglycemia, controlled P:   Cont SSI   NEUROLOGIC A:   Post anoxic encephalopathy  Multiple prior CVAs with encephalomalacia Mod-severe cortical atrophy by CT head 01/05 Poor prognosis for functional recovery P:   RASS goal: 0 Minimizing sedation/opioids    Family will be updated again this afternoon. I have advised one way extubation now or in very near future  CCM X 40 mins  Billy Fischer, MD ; [facility] [phone].  After 5:30 PM or weekends, call [phone]

## 2014-04-10 NOTE — Progress Notes (Signed)
CSW spoke to patient's son [name] via phone this afternoon and provided support.  He stated that he would be coming to the hospital soon and that he and family was "taking things one day at a time."  CSW stated would follow up with family on Monday and available if needed today.  Son states that his mother is coping as well as possible.  CSW spoke to patient's nurse. Current plan is to monitor over the weekend and if no improvement- initiate a palliative wean from the vent the first of the week. CSW will provide support to family. Reyne Dumas, RN Liaison for [facility] updated. Lorri Frederick. Jaci Lazier, [place] [phone]

## 2014-04-11 ENCOUNTER — Inpatient Hospital Stay (HOSPITAL_COMMUNITY): Payer: Medicare Other

## 2014-04-11 DIAGNOSIS — G931 Anoxic brain damage, not elsewhere classified: Secondary | ICD-10-CM

## 2014-04-11 LAB — BASIC METABOLIC PANEL
Anion gap: 10 (ref 5–15)
BUN: 83 mg/dL — AB (ref 6–23)
CHLORIDE: 111 meq/L (ref 96–112)
CO2: 24 mmol/L (ref 19–32)
Calcium: 7.8 mg/dL — ABNORMAL LOW (ref 8.4–10.5)
Creatinine, Ser: 1.8 mg/dL — ABNORMAL HIGH (ref 0.50–1.35)
GFR calc Af Amer: 40 mL/min — ABNORMAL LOW (ref >90)
GFR calc non Af Amer: 34 mL/min — ABNORMAL LOW (ref >90)
Glucose, Bld: 197 mg/dL — ABNORMAL HIGH (ref 70–99)
Potassium: 3.8 mmol/L (ref 3.5–5.1)
Sodium: 145 mmol/L (ref 135–145)

## 2014-04-11 LAB — GLUCOSE, CAPILLARY
GLUCOSE-CAPILLARY: 198 mg/dL — AB (ref 70–99)
Glucose-Capillary: 162 mg/dL — ABNORMAL HIGH (ref 70–99)
Glucose-Capillary: 201 mg/dL — ABNORMAL HIGH (ref 70–99)
Glucose-Capillary: 163 mg/dL — ABNORMAL HIGH (ref 70–99)
Glucose-Capillary: 158 mg/dL — ABNORMAL HIGH (ref 70–99)
Glucose-Capillary: 125 mg/dL — ABNORMAL HIGH (ref 70–99)
Glucose-Capillary: 149 mg/dL — ABNORMAL HIGH (ref 70–99)
Glucose-Capillary: 158 mg/dL — ABNORMAL HIGH (ref 70–99)

## 2014-04-11 LAB — CBC
HCT: 24.8 % — ABNORMAL LOW (ref 39.0–52.0)
HEMATOCRIT: 21.8 % — AB (ref 39.0–52.0)
Hemoglobin: 6.5 g/dL — CL (ref 13.0–17.0)
Hemoglobin: 7.9 g/dL — ABNORMAL LOW (ref 13.0–17.0)
MCH: 25 pg — AB (ref 26.0–34.0)
MCH: 26.3 pg (ref 26.0–34.0)
MCHC: 29.8 g/dL — ABNORMAL LOW (ref 30.0–36.0)
MCHC: 31.9 g/dL (ref 30.0–36.0)
MCV: 83.8 fL (ref 78.0–100.0)
MCV: 82.7 fL (ref 78.0–100.0)
Platelets: 76 10*3/uL — ABNORMAL LOW (ref 150–400)
Platelets: 71 10*3/uL — ABNORMAL LOW (ref 150–400)
RBC: 2.6 MIL/uL — ABNORMAL LOW (ref 4.22–5.81)
RBC: 3 MIL/uL — ABNORMAL LOW (ref 4.22–5.81)
RDW: 18.2 % — ABNORMAL HIGH (ref 11.5–15.5)
RDW: 17.2 % — ABNORMAL HIGH (ref 11.5–15.5)
WBC: 4.6 10*3/uL (ref 4.0–10.5)
WBC: 5.3 10*3/uL (ref 4.0–10.5)

## 2014-04-11 LAB — CULTURE, BLOOD (ROUTINE X 2)
Culture: NO GROWTH
Culture: NO GROWTH

## 2014-04-11 LAB — PREPARE RBC (CROSSMATCH)

## 2014-04-11 MED ORDER — SODIUM CHLORIDE 0.9 % IV SOLN
Freq: Once | INTRAVENOUS | Status: AC
  Administered 2014-04-11: 15:00:00 via INTRAVENOUS

## 2014-04-11 NOTE — Progress Notes (Signed)
ANTIBIOTIC CONSULT NOTE - FOLLOW UP  Pharmacy Consult for vanc/zosyn Indication: MRSA pna/Ecoli UTI  No Known Allergies  Patient Measurements: Height:  (172.7 cm) Weight: 168 lb 3.4 oz (76.3 kg) IBW/kg (Calculated) : 68.4 Adjusted Body Weight: n/a  Vital Signs: Temp: 98.5 F (36.9 C) (01/09 0700) Temp Source: Oral (01/09 0700) BP: 146/46 mmHg (01/09 0800) Pulse Rate: 73 (01/09 0800) Intake/Output from previous day: 01/08 0701 - 01/09 0700 In: 1630 [P.O.:10; I.V.:30; NG/GT:1265; IV Piggyback:325] Out: 1300 [Urine:1300] Intake/Output from this shift:    Labs:  Recent Labs  04/09/14 0430 04/10/14 0418 04/11/14 0448  WBC 5.8 5.7 4.6  HGB 7.7* 7.4* 6.5*  PLT 98* 88* 76*  CREATININE 2.11* 1.98* 1.80*   Estimated Creatinine Clearance: 32.7 mL/min (by C-G formula based on Cr of 1.8). No results for input(s): VANCOTROUGH, VANCOPEAK, VANCORANDOM, GENTTROUGH, GENTPEAK, GENTRANDOM, TOBRATROUGH, TOBRAPEAK, TOBRARND, AMIKACINPEAK, AMIKACINTROU, AMIKACIN in the last 72 hours.   Microbiology: Recent Results (from the past 720 hour(s))  Urine culture     Status: None   Collection Time: 04/04/2014 11:47 AM  Result Value Ref Range Status   Specimen Description URINE, CATHETERIZED  Final   Special Requests NONE  Final   Colony Count   Final    >=100,000 COLONIES/ML Performed at [facility]    Culture   Final    ESCHERICHIA COLI Note: Confirmed Extended Spectrum Beta-Lactamase Producer (ESBL) CRITICAL RESULT CALLED TO, READ BACK BY AND VERIFIED WITH: CAROLINE FOWLER AT 3:56 A.M. ON 04/08/2013 WARRB Performed at [facility]    Report Status 04/08/2014 FINAL  Final   Organism ID, Bacteria ESCHERICHIA COLI  Final      Susceptibility   Escherichia coli - MIC*    AMPICILLIN >=32 RESISTANT Resistant     CEFAZOLIN >=64 RESISTANT Resistant     CEFTRIAXONE >=64 RESISTANT Resistant     CIPROFLOXACIN >=4 RESISTANT Resistant     GENTAMICIN <=1 SENSITIVE Sensitive      LEVOFLOXACIN >=8 RESISTANT Resistant     NITROFURANTOIN <=16 SENSITIVE Sensitive     TOBRAMYCIN <=1 SENSITIVE Sensitive     TRIMETH/SULFA >=320 RESISTANT Resistant     IMIPENEM <=0.25 SENSITIVE Sensitive     PIP/TAZO <=4 SENSITIVE Sensitive     * ESCHERICHIA COLI  MRSA PCR Screening     Status: Abnormal   Collection Time: 04/13/2014  1:58 PM  Result Value Ref Range Status   MRSA by PCR POSITIVE (A) NEGATIVE Final    Comment:        The GeneXpert MRSA Assay (FDA approved for NASAL specimens only), is one component of a comprehensive MRSA colonization surveillance program. It is not intended to diagnose MRSA infection nor to guide or monitor treatment for MRSA infections. RESULT CALLED TO, READ BACK BY AND VERIFIED WITH: L.SHORT 04/17/2014 1620 BY V.WILKINS   Culture, blood (routine x 2)     Status: None (Preliminary result)   Collection Time: 05/02/2014  2:10 PM  Result Value Ref Range Status   Specimen Description BLOOD LEFT ARM  Final   Special Requests BOTTLES DRAWN AEROBIC ONLY 5CC  Final   Culture   Final           BLOOD CULTURE RECEIVED NO GROWTH TO DATE CULTURE WILL BE HELD FOR 5 DAYS BEFORE ISSUING A FINAL NEGATIVE REPORT Performed at [facility]    Report Status PENDING  Incomplete  Culture, blood (routine x 2)     Status: None (Preliminary result)  Collection Time: 04/26/2014  2:30 PM  Result Value Ref Range Status   Specimen Description BLOOD LEFT ARM  Final   Special Requests BOTTLES DRAWN AEROBIC ONLY 3CC  Final   Culture   Final           BLOOD CULTURE RECEIVED NO GROWTH TO DATE CULTURE WILL BE HELD FOR 5 DAYS BEFORE ISSUING A FINAL NEGATIVE REPORT Note: Culture results may be compromised due to an inadequate volume of blood received in culture bottles. Performed at [facility]    Report Status PENDING  Incomplete  Culture, respiratory (NON-Expectorated)     Status: None   Collection Time: 04/14/2014  4:19 PM  Result Value Ref Range Status    Specimen Description TRACHEAL ASPIRATE  Final   Special Requests NONE  Final   Gram Stain   Final    NO WBC SEEN NO SQUAMOUS EPITHELIAL CELLS SEEN RARE GRAM NEGATIVE RODS RARE GRAM POSITIVE COCCI IN PAIRS Performed at [facility]    Culture   Final    ABUNDANT METHICILLIN RESISTANT STAPHYLOCOCCUS AUREUS Note: RIFAMPIN AND GENTAMICIN SHOULD NOT BE USED AS SINGLE DRUGS FOR TREATMENT OF STAPH INFECTIONS. CRITICAL RESULT CALLED TO, READ BACK BY AND VERIFIED WITH: MARY N 04/08/14  @300  BY REAMM Performed at [facility]    Report Status 04/08/2014 FINAL  Final   Organism ID, Bacteria METHICILLIN RESISTANT STAPHYLOCOCCUS AUREUS  Final      Susceptibility   Methicillin resistant staphylococcus aureus - MIC*    CLINDAMYCIN >=8 RESISTANT Resistant     ERYTHROMYCIN >=8 RESISTANT Resistant     GENTAMICIN <=0.5 SENSITIVE Sensitive     LEVOFLOXACIN >=8 RESISTANT Resistant     OXACILLIN >=4 RESISTANT Resistant     PENICILLIN >=0.5 RESISTANT Resistant     RIFAMPIN <=0.5 SENSITIVE Sensitive     TRIMETH/SULFA <=10 SENSITIVE Sensitive     VANCOMYCIN <=0.5 SENSITIVE Sensitive     TETRACYCLINE >=16 RESISTANT Resistant     * ABUNDANT METHICILLIN RESISTANT STAPHYLOCOCCUS AUREUS  Clostridium Difficile by PCR     Status: None   Collection Time: 04/08/14  8:04 PM  Result Value Ref Range Status   C difficile by pcr NEGATIVE NEGATIVE Final    Anti-infectives    Start     Dose/Rate Route Frequency Ordered Stop   04/09/14 1400  vancomycin (VANCOCIN) IVPB 750 mg/150 ml premix     750 mg150 mL/hr over 60 Minutes Intravenous Every 24 hours 04/08/14 1535     04/08/14 1545  vancomycin (VANCOCIN) IVPB 1000 mg/200 mL premix     1,000 mg200 mL/hr over 60 Minutes Intravenous  Once 04/08/14 1535 04/08/14 1726   04/08/14 1400  piperacillin-tazobactam (ZOSYN) IVPB 3.375 g     3.375 g12.5 mL/hr over 240 Minutes Intravenous 3 times per day 04/08/14 1051     04/28/2014 1145  ampicillin-sulbactam (UNASYN)  1.5 g in sodium chloride 0.9 % 50 mL IVPB  Status:  Discontinued     1.5 g100 mL/hr over 30 Minutes Intravenous Every 12 hours 04/16/2014 1139 04/08/14 1035      Assessment: 81 yo male continuing on D#4 of vanc/zosyn for MRSA pna/Ecoli UTI. Prognosis is poor, CCM recommends one-way extubation. Family agrees to transition to comfort care at 1st of week if no improvement. Afebrile, wbc wnl. 1/9 CXR- atelectasis or mild pna, mildly increased from prior. Renal function improving 1.98>>1.8, CrC~32, UOP ok 0.7.  1/3 Unasyn>>1/6 1/6 Zosyn> 1/6 vanco>>  1/3 urine>> 100k Ecoli (sens to  pip-tazo, imipenem, aminoglycosides) 1/3 resp>> abundant MRSA (S-vanc) 1/3 blood>>ngtd 1/6 cdiff: neg  Goal of Therapy:  Resolution of infection  Plan:  - Zosyn 3.375 g IV q 8 hrs, watch renal fx - Vancomycin  iv q24 - f/u cultures, abx plan - f/u goals of care (comfort care 1st of wk if no improvement) - f/u VT as indicated  Babs Bertin, PharmD Clinical Pharmacist - Resident Pager [phone] 04/11/2014 9:22 AM

## 2014-04-11 NOTE — Progress Notes (Signed)
PULMONARY / CRITICAL CARE MEDICINE   Name: [name] MRN: [id number] DOB: July 26, 1933    ADMISSION DATE:  04/28/14  REFERRING MD :  EDP  CHIEF COMPLAINT:  Cardiac arrest   INITIAL PRESENTATION:  81 yo male SNF pt w/ witnessed cardiac arrest with immediate CPR , initial PEA noted , 25 min CPR w/ 3 epi prior to ER, ROS w/ junctional rhythm . PCCM to admit.   SIGNIFICANT EVENTS/STUDIES:  01/03 Admission via ED post PEA arrest. [place] protocol initiated 01/03 Echocardiogram: Mild LVH, LVEF 25-30%, Grade 2 diastolic dysfunction, moderately dilated LA, PASP est 42 mmHg 01/04 EEG: diffuse, nonspecific slowing 01/04 Cardiac markers negative 01/05 Rewarmed. Very minimal response to deep pain. Apneic when changed to PSV mode.  01/05 Repeat EEG: diffuse slowing 01/05 CT head: multiple old strokes and encephalomalacia. Mod-severe cerebral atrophy.  01/05 Family conference: DNR if arrests. Continue current support. Reassess daily with plans for withdrawal if no improvement in 24-48 hrs 01/06 Slightly more responsive - minimally so.  01/07 Slightly more responsive but still not F/C and not requiring any sedation or analgesia.  01/08 Little change. Tolerated PS 10 cm H2O but poor cognition prohibits extubation  INDWELLING DEVICES: ETT 01/03 >>  R IJ CVL 01/03 >>  R radial A-line 01/03 >> 01/06  MICRO DATA: PCT 01/04: 1.86,  01/05: 1.96 Urine 01/03 >> 100k E coli (sens to pip-tazo, imipenem, aminoglycosides) Resp  01/03 >> abundant MRSA Blood 01/03 >> NEG   ANTIMICROBIALS:  Amp-sulbactam 01/03 >> 01/06 Pip-tazo 01/06 >>  Vanc 01/07 >>   SUBJECTIVE:   VITAL SIGNS: Temp:  [97.7 F (36.5 C)-98.8 F (37.1 C)] 98.3 F (36.8 C) (01/09 1300) Pulse Rate:  [60-75] 60 (01/09 1400) Resp:  [13-21] 14 (01/09 1400) BP: (133-160)/(38-58) 149/50 mmHg (01/09 1400) SpO2:  [100 %] 100 % (01/09 1400) FiO2 (%):  [40 %] 40 % (01/09 1200) HEMODYNAMICS:   VENTILATOR SETTINGS: Vent Mode:   [-] PSV;CPAP FiO2 (%):  [40 %] 40 % Set Rate:  [14 bmp] 14 bmp Vt Set:  [500 mL] 500 mL PEEP:  [5 cmH20] 5 cmH20 Pressure Support:  [8 cmH20-10 cmH20] 8 cmH20 Plateau Pressure:  [15 cmH20-16 cmH20] 15 cmH20 INTAKE / OUTPUT:  Intake/Output Summary (Last 24 hours) at 04/11/14 1419 Last data filed at 04/11/14 1300  Gross per 24 hour  Intake   1745 ml  Output   1550 ml  Net    195 ml    PHYSICAL EXAMINATION: General: RASS -3, minimal spont movement, not F/C  Neuro: Opens eyes spont and to pain, upward gaze, doll's eyes intact, corneal intact, does not blink to threat. Tried to move LLE, but no other movement of any kind HEENT: NCAT Cardiovascular: reg, soft systolic M Lungs: clear anteriorly Abdomen:  Soft , hypoactive BS  Ext: no edema, cool, severe bilateral heel ulcers   LABS: I have reviewed all of today's lab results. Relevant abnormalities are discussed in the A/P section  CXR: NNF  ASSESSMENT / PLAN:  PULMONARY A: VDRF post cardiac arrest Pulmonary infiltrates, improved Small R effusion P:   Cont vent support for now and allow PSV as tolerated. No goal extubate due to MS Cont vent bundle Continue nebulized BDs Stop nebulized steroids 1/9  CARDIOVASCULAR A:  S/P prolonged PEA arrest  H/O PAF Cardiomyopathy (LVEF 25-30%) HTN P:  MAP goal > 60 mmHg Holding home antihypertensive medications DNR if arrests again  RENAL A:   Hyperkalemia, resolved CKD (baseline Cr 1.67)  AKI, nonoliguric, improving Not a candidate for HD P:   Monitor BMET intermittently Monitor I/Os Correct electrolytes as indicated  GASTROINTESTINAL A:   Protein-calorie malnutrition P:   SUP: enteral famotidine Cont TFs  HEMATOLOGIC A:  Anemia without overt blood loss Thrombocytopenia, etiology unclear P:  DVT px: SCDs Monitor CBC intermittently Transfuse per usual ICU guidelines  INFECTIOUS A:  Suspect aspiration PNA  E coli UTI - highly resistant  P:   Micro and  abx as above  ENDOCRINE A:   DM 2, diet controlled prior to admission Hyperglycemia, controlled P:   Cont SSI   NEUROLOGIC A:   Post anoxic encephalopathy  Multiple prior CVAs with encephalomalacia Mod-severe cortical atrophy by CT head 01/05 Poor prognosis for functional recovery P:   RASS goal: 0 Minimizing sedation/opioids Hx consistent with a devastating hypoxic anoxia. Low likelihood for meaningful recovery. Will need to broach subject of withdrawal to comfort care when family prepared to do so.    Family was updated by Dr Sung Amabile 1/8. Agree with a one-way extubation when family on board  Independent CC time 40 minutes  Levy Pupa, MD, PhD 04/11/2014, 2:29 PM [facility] [phone] or if no answer [phone] \

## 2014-04-11 NOTE — Progress Notes (Signed)
eLink Physician-Brief Progress Note Patient Name: [name] DOB: [birth year] MRN: [id number]   Date of Service  04/11/2014  HPI/Events of Note  Hgb down to 6.5 from 7.4  eICU Interventions  Plan: Transfuse 1 unit pRBC  Post-transfusion CBC     Intervention Category Intermediate Interventions: Bleeding - evaluation and treatment with blood products  Monchel Pollitt 04/11/2014, 5:51 AM

## 2014-04-12 DIAGNOSIS — N179 Acute kidney failure, unspecified: Secondary | ICD-10-CM

## 2014-04-12 LAB — BASIC METABOLIC PANEL
Anion gap: 7 (ref 5–15)
BUN: 80 mg/dL — ABNORMAL HIGH (ref 6–23)
CHLORIDE: 112 meq/L (ref 96–112)
CO2: 25 mmol/L (ref 19–32)
Calcium: 7.9 mg/dL — ABNORMAL LOW (ref 8.4–10.5)
Creatinine, Ser: 1.58 mg/dL — ABNORMAL HIGH (ref 0.50–1.35)
GFR calc Af Amer: 47 mL/min — ABNORMAL LOW (ref >90)
GFR calc non Af Amer: 40 mL/min — ABNORMAL LOW (ref >90)
Glucose, Bld: 186 mg/dL — ABNORMAL HIGH (ref 70–99)
Potassium: 3.5 mmol/L (ref 3.5–5.1)
SODIUM: 144 mmol/L (ref 135–145)

## 2014-04-12 LAB — GLUCOSE, CAPILLARY
GLUCOSE-CAPILLARY: 192 mg/dL — AB (ref 70–99)
Glucose-Capillary: 160 mg/dL — ABNORMAL HIGH (ref 70–99)
Glucose-Capillary: 159 mg/dL — ABNORMAL HIGH (ref 70–99)
Glucose-Capillary: 137 mg/dL — ABNORMAL HIGH (ref 70–99)
Glucose-Capillary: 151 mg/dL — ABNORMAL HIGH (ref 70–99)

## 2014-04-12 LAB — CBC
HCT: 25.8 % — ABNORMAL LOW (ref 39.0–52.0)
Hemoglobin: 8 g/dL — ABNORMAL LOW (ref 13.0–17.0)
MCH: 25.9 pg — ABNORMAL LOW (ref 26.0–34.0)
MCHC: 31 g/dL (ref 30.0–36.0)
MCV: 83.5 fL (ref 78.0–100.0)
Platelets: 88 10*3/uL — ABNORMAL LOW (ref 150–400)
RBC: 3.09 MIL/uL — AB (ref 4.22–5.81)
RDW: 17.6 % — AB (ref 11.5–15.5)
WBC: 6.3 10*3/uL (ref 4.0–10.5)

## 2014-04-12 LAB — TYPE AND SCREEN
ABO/RH(D): O POS
Antibody Screen: NEGATIVE
Unit division: 0

## 2014-04-12 NOTE — Clinical Social Work Psychosocial (Addendum)
    Clinical Social Work Department BRIEF PSYCHOSOCIAL ASSESSMENT 04/07/2014  Patient:  [name], [name]     Account Number:  [id number]     Admit date:  04/25/2014  Clinical Social Worker:  Manya Silvas  Date/Time:  04/07/2014 11:25 AM  Referred by:  Physician  Date Referred:  04/06/2014 Referred for  Other - See comment   Other Referral:   Return to SNF   Interview type:  Family Other interview type:   Brief telephone call with wife. Patient on vent    PSYCHOSOCIAL DATA Living Status:  FACILITY Admitted from facility:   Level of care:   Primary support name:  [name]  [phone] Primary support relationship to patient:  SPOUSE Degree of support available:   Strong support  Sone:  [name]   [phone]    CURRENT CONCERNS Current Concerns  Other - See comment   Other Concerns:   Return to SNF    SOCIAL WORK ASSESSMENT / PLAN 81 year old male admitted from [facility]. Required SNF level of care. Patient is currently intubated and unable to respond.  CSW spoke with Barry Dumas, RN Liaison- [facility].. She stated that patient was well managed at the facility and will accept back when medically stable.  Fl2 initated and wll be placed on chart for MD's signautre.. Will update as patient's condition changes.   Assessment/plan status:  Psychosocial Support/Ongoing Assessment of Needs Other assessment/ plan:   Information/referral to community resources:   None at this time    PATIENT'S/FAMILY'S RESPONSE TO PLAN OF CARE: Patient is currently intubated and unable to respond to plan of care. CSW spoke briefly with his wife and support provided. She would like patient to return to SNF when stable.  CSW will monitor and provide support during recovery process.

## 2014-04-12 NOTE — Progress Notes (Signed)
PULMONARY / CRITICAL CARE MEDICINE   Name: [name] MRN: [id number] DOB: 01/17/1934    ADMISSION DATE:  04/25/2014  REFERRING MD :  EDP  CHIEF COMPLAINT:  Cardiac arrest   INITIAL PRESENTATION:  81 yo male SNF pt w/ witnessed cardiac arrest with immediate CPR , initial PEA noted , 25 min CPR w/ 3 epi prior to ER, ROS w/ junctional rhythm . PCCM to admit.   SIGNIFICANT EVENTS/STUDIES:  01/03 Admission via ED post PEA arrest. [place] Sun protocol initiated 01/03 Echocardiogram: Mild LVH, LVEF 25-30%, Grade 2 diastolic dysfunction, moderately dilated LA, PASP est 42 mmHg 01/04 EEG: diffuse, nonspecific slowing 01/04 Cardiac markers negative 01/05 Rewarmed. Very minimal response to deep pain. Apneic when changed to PSV mode.  01/05 Repeat EEG: diffuse slowing 01/05 CT head: multiple old strokes and encephalomalacia. Mod-severe cerebral atrophy.  01/05 Family conference: DNR if arrests. Continue current support. Reassess daily with plans for withdrawal if no improvement in 24-48 hrs 01/06 Slightly more responsive - minimally so.  01/07 Slightly more responsive but still not F/C and not requiring any sedation or analgesia.  01/08 Little change. Tolerated PS 10 cm H2O but poor cognition prohibits extubation  INDWELLING DEVICES: ETT 01/03 >>  R IJ CVL 01/03 >>  R radial A-line 01/03 >> 01/06  MICRO DATA: PCT 01/04: 1.86,  01/05: 1.96 Urine 01/03 >> 100k E coli (sens to pip-tazo, imipenem, aminoglycosides) Resp  01/03 >> abundant MRSA Blood 01/03 >> NEG  SUBJECTIVE:   VITAL SIGNS: Temp:  [97.8 F (36.6 C)-98.8 F (37.1 C)] 98.8 F (37.1 C) (01/10 1300) Pulse Rate:  [63-73] 73 (01/10 1400) Resp:  [13-28] 28 (01/10 1400) BP: (143-161)/(44-57) 155/53 mmHg (01/10 1400) SpO2:  [100 %] 100 % (01/10 1407) FiO2 (%):  [40 %] 40 % (01/10 1407) HEMODYNAMICS:   VENTILATOR SETTINGS: Vent Mode:  [-] PSV;CPAP FiO2 (%):  [40 %] 40 % Set Rate:  [14 bmp] 14 bmp Vt Set:  [500 mL] 500  mL PEEP:  [5 cmH20] 5 cmH20 Pressure Support:  [5 cmH20-8 cmH20] 8 cmH20 Plateau Pressure:  [18 cmH20-20 cmH20] 19 cmH20 INTAKE / OUTPUT:  Intake/Output Summary (Last 24 hours) at 04/12/14 1504 Last data filed at 04/12/14 1400  Gross per 24 hour  Intake   2195 ml  Output   1595 ml  Net    600 ml    PHYSICAL EXAMINATION: General: RASS -3, minimal spont movement, not F/C  Neuro: Opens eyes spont and to pain, upward gaze, doll's eyes intact, corneal intact, does not blink to threat. Tried to move LLE, but no other movement of any kind. No sig change  HEENT: NCAT Cardiovascular: reg, soft systolic M Lungs: clear anteriorly, + rhonchi  Abdomen:  Soft , hypoactive BS  Ext: no edema, cool, severe bilateral heel ulcers CBC Recent Labs     04/11/14  0448  04/11/14  1400  04/12/14  0500  WBC  4.6  5.3  6.3  HGB  6.5*  7.9*  8.0*  HCT  21.8*  24.8*  25.8*  PLT  76*  71*  88*    Coag's No results for input(s): APTT, INR in the last 72 hours.  BMET Recent Labs     04/10/14  0418  04/11/14  0448  04/12/14  0500  NA  143  145  144  K  4.3  3.8  3.5  CL  112  111  112  CO2  25  24  25   BUN  82*  83*  80*  CREATININE  1.98*  1.80*  1.58*  GLUCOSE  160*  197*  186*    Electrolytes Recent Labs     04/10/14  0418  04/11/14  0448  04/12/14  0500  CALCIUM  7.9*  7.8*  7.9*    Sepsis Markers No results for input(s): PROCALCITON, O2SATVEN in the last 72 hours.  Invalid input(s): LACTICACIDVEN  ABG No results for input(s): PHART, PCO2ART, PO2ART in the last 72 hours.  Liver Enzymes Recent Labs     04/10/14  0418  AST  31  ALT  48  ALKPHOS  60  BILITOT  0.5  ALBUMIN  1.7*    Cardiac Enzymes No results for input(s): TROPONINI, PROBNP in the last 72 hours.  Glucose Recent Labs     04/11/14  1619  04/11/14  2040  04/11/14  2349  04/12/14  0550  04/12/14  0744  04/12/14  1256  GLUCAP  149*  158*  160*  192*  159*  137*    Imaging Dg Chest Port 1  View  04/11/2014   CLINICAL DATA:  Follow-up respiratory failure. Subsequent encounter.  EXAM: PORTABLE CHEST - 1 VIEW  COMPARISON:  Chest radiograph from 04/08/2014  FINDINGS: The patient's endotracheal tube is seen ending 3 cm above the carina. The enteric tube is noted extending below the diaphragm. A right IJ line is noted ending about the cavoatrial junction  A small right pleural effusion is noted, with right basilar airspace opacification possibly reflecting atelectasis or mild pneumonia. This is slightly increased from the prior study. The left lung appears relatively clear. No pneumothorax is identified.  The cardiomediastinal silhouette is borderline normal in size. No acute osseous abnormalities are seen.  IMPRESSION: Small right pleural effusion, with right basilar airspace opacification possibly reflecting atelectasis or mild pneumonia. This is mildly increased from the prior study.   Electronically Signed   By: Roanna Raider M.D.   On: 04/11/2014 08:20      ASSESSMENT / PLAN:  PULMONARY A: VDRF post cardiac arrest Pulmonary infiltrates, improved Small R effusion P:   Cont vent support for now and allow PSV as tolerated. No goal extubate due to MS Cont vent bundle Continue nebulized BDs Stopped nebulized steroids 1/9  CARDIOVASCULAR A:  S/P prolonged PEA arrest  H/O PAF Cardiomyopathy (LVEF 25-30%) HTN P:  MAP goal > 60 mmHg Holding home antihypertensive medications DNR if arrests again  RENAL A:   Hyperkalemia, resolved CKD (baseline Cr 1.67) AKI, nonoliguric, improving Not a candidate for HD P:   Monitor BMET intermittently Monitor I/Os Correct electrolytes as indicated  GASTROINTESTINAL A:   Protein-calorie malnutrition P:   SUP: enteral famotidine Cont TFs  HEMATOLOGIC A:  Anemia without overt blood loss Thrombocytopenia, etiology unclear P:  DVT px: SCDs Monitor CBC intermittently Transfuse per usual ICU guidelines  INFECTIOUS A:  Suspect  aspiration PNA  E coli UTI - highly resistant  P:   Amp-sulbactam 01/03 >> 01/06 Pip-tazo 01/06 >>  Vanc 01/07 >>  Will complete 10d rx   ENDOCRINE A:   DM 2, diet controlled prior to admission Hyperglycemia, controlled P:   Cont SSI   NEUROLOGIC A:   Post anoxic encephalopathy  Multiple prior CVAs with encephalomalacia Mod-severe cortical atrophy by CT head 01/05 Persistent vegetative state  Poor prognosis for functional recovery P:   RASS goal: 0 Minimizing sedation/opioids Hx consistent with a devastating hypoxic anoxia. Low likelihood for meaningful recovery. Will need to  broach subject of withdrawal to comfort care when family prepared to do so.    Family was updated by Dr Sung Amabile 1/8. Agree with a one-way extubation when family on board  NP SUMMARY  80 year old male s/p prolonged cardiac event complicated by severe anoxic injury. Now in persistent vegetative state. Prognosis for meaningful recovery very poor. Plan is for likely one-way-extubation 1/11, family will need support for this. He is NOT a candidate for trach. Nothing further to add.   Simonne Martinet ACNP-BC [facility] Pulmonary/Critical Care Pager # [phone] OR # [phone] if no answer   Attending Note:  I have examined patient, reviewed labs, studies and notes. I have discussed the case with Kreg Shropshire, and I agree with the data and plans as amended above. Unfortunately no evidence for neuro recovery. Very difficult for his family, son in particular, but will need to withdraw care in coming days.   Levy Pupa, MD, PhD 04/12/2014, 4:08 PM [facility] [phone] or if no answer [phone]

## 2014-04-13 LAB — COMPREHENSIVE METABOLIC PANEL
ALK PHOS: 63 U/L (ref 39–117)
ALT: 54 U/L — AB (ref 0–53)
AST: 37 U/L (ref 0–37)
Albumin: 1.7 g/dL — ABNORMAL LOW (ref 3.5–5.2)
Anion gap: 6 (ref 5–15)
BILIRUBIN TOTAL: 0.7 mg/dL (ref 0.3–1.2)
BUN: 78 mg/dL — AB (ref 6–23)
CHLORIDE: 113 meq/L — AB (ref 96–112)
CO2: 26 mmol/L (ref 19–32)
Calcium: 8 mg/dL — ABNORMAL LOW (ref 8.4–10.5)
Creatinine, Ser: 1.59 mg/dL — ABNORMAL HIGH (ref 0.50–1.35)
GFR, EST AFRICAN AMERICAN: 46 mL/min — AB (ref >90)
GFR, EST NON AFRICAN AMERICAN: 40 mL/min — AB (ref >90)
Glucose, Bld: 169 mg/dL — ABNORMAL HIGH (ref 70–99)
Potassium: 3.2 mmol/L — ABNORMAL LOW (ref 3.5–5.1)
Sodium: 145 mmol/L (ref 135–145)
TOTAL PROTEIN: 4.9 g/dL — AB (ref 6.0–8.3)

## 2014-04-13 LAB — GLUCOSE, CAPILLARY
GLUCOSE-CAPILLARY: 161 mg/dL — AB (ref 70–99)
GLUCOSE-CAPILLARY: 137 mg/dL — AB (ref 70–99)
GLUCOSE-CAPILLARY: 122 mg/dL — AB (ref 70–99)
Glucose-Capillary: 146 mg/dL — ABNORMAL HIGH (ref 70–99)
Glucose-Capillary: 164 mg/dL — ABNORMAL HIGH (ref 70–99)

## 2014-04-13 LAB — CBC
HCT: 26.1 % — ABNORMAL LOW (ref 39.0–52.0)
HEMOGLOBIN: 8 g/dL — AB (ref 13.0–17.0)
MCH: 26 pg (ref 26.0–34.0)
MCHC: 30.7 g/dL (ref 30.0–36.0)
MCV: 84.7 fL (ref 78.0–100.0)
Platelets: 96 10*3/uL — ABNORMAL LOW (ref 150–400)
RBC: 3.08 MIL/uL — AB (ref 4.22–5.81)
RDW: 18.1 % — AB (ref 11.5–15.5)
WBC: 6.2 10*3/uL (ref 4.0–10.5)

## 2014-04-13 MED ORDER — LORAZEPAM 2 MG/ML IJ SOLN: 2.0000 mg | INTRAMUSCULAR | Status: DC | PRN

## 2014-04-13 MED ORDER — MORPHINE SULFATE 2 MG/ML IJ SOLN: 2.0000 mg | INTRAMUSCULAR | Status: DC | PRN

## 2014-04-13 NOTE — Progress Notes (Signed)
PCCM Interval Note and Family Meeting  I examined the patient befor the family meeting - there has been no neurological change since my last exam on 1/10.   I was able to meet with the patient's wife, son and daughters this afternoon to discuss his status, prognosis and our next steps. I explained that he has a severe brain injury and that there is no chance for meaningful neuro recovery. They understood, asked very appropriate questions about his exam, his reflexes. We discussed next steps, specifically withdrawal of MV. They understand the rationale and agree that withdrawal is indicated. All were understandably sad but also agreeable based on his clinical status. They asked how long he might survive post-extubation and I estimated hours-to-days. I have asked them to speak as a family, then let Boyd Kerbs, Charity fundraiser, or his next nurse know when they would like to withdraw. They are going to do this.   40 minutes CC time  Levy Pupa, MD, PhD 04/13/2014, 5:52 PM [facility] [phone] or if no answer [phone]

## 2014-04-13 NOTE — Progress Notes (Signed)
PULMONARY / CRITICAL CARE MEDICINE   Name: [name] MRN: [id number] DOB: January 19, 1934    ADMISSION DATE:  04/13/14  REFERRING MD :  EDP  CHIEF COMPLAINT:  Cardiac arrest   INITIAL PRESENTATION:  81 yo male SNF pt w/ witnessed cardiac arrest with immediate CPR , initial PEA noted , 25 min CPR w/ 3 epi prior to ER, ROS w/ junctional rhythm . PCCM to admit.   SIGNIFICANT EVENTS:  01/03 Admission via ED post PEA arrest. [place] protocol initiated 01/05 Rewarmed. Very minimal response to deep pain. Apneic when changed to PSV mode.  01/05 Family conference: DNR if arrests. Continue current support. Reassess daily with plans for withdrawal if no improvement in 24-48 hrs 01/06 Slightly more responsive - minimally so.  01/07 Slightly more responsive but still not F/C and not requiring any sedation or analgesia.  01/08 Little change. Tolerated PS 10 cm H2O but poor cognition prohibits extubation  STUDIES: 01/03 Echocardiogram: Mild LVH, LVEF 25-30%, Grade 2 diastolic dysfunction, moderately dilated LA, PASP est 42 mmHg 01/04 EEG: diffuse, nonspecific slowing 01/05 Repeat EEG: diffuse slowing 01/05 CT head: multiple old strokes and encephalomalacia. Mod-severe cerebral atrophy.   SUBJECTIVE: Unresponsive.  VITAL SIGNS: Temp:  [98.2 F (36.8 C)-99 F (37.2 C)] 98.7 F (37.1 C) (01/11 0800) Pulse Rate:  [63-74] 74 (01/11 0800) Resp:  [14-28] 19 (01/11 0800) BP: (145-171)/(42-58) 151/48 mmHg (01/11 0800) SpO2:  [99 %-100 %] 100 % (01/11 0800) FiO2 (%):  [30 %-40 %] 30 % (01/11 0800) Weight:  [166 lb 7.2 oz (75.5 kg)] 166 lb 7.2 oz (75.5 kg) (01/11 0400) VENTILATOR SETTINGS: Vent Mode:  [-] PSV;CPAP FiO2 (%):  [30 %-40 %] 30 % Set Rate:  [14 bmp] 14 bmp Vt Set:  [500 mL] 500 mL PEEP:  [5 cmH20] 5 cmH20 Pressure Support:  [5 cmH20-8 cmH20] 8 cmH20 Plateau Pressure:  [20 cmH20-21 cmH20] 20 cmH20 INTAKE / OUTPUT:  Intake/Output Summary (Last 24 hours) at 04/13/14 0947 Last  data filed at 04/13/14 0600  Gross per 24 hour  Intake   2075 ml  Output   1635 ml  Net    440 ml    PHYSICAL EXAMINATION: General: unresponsive Neuro: does not follow commands HEENT: pupils reactive, ETT in place Cardiovascular: regular Lungs: scattered rhonchi Abdomen:  Soft  Ext: b/l heel ulcers Skin: multiple areas of ecchymosis  CBC Recent Labs     04/11/14  1400  04/12/14  0500  04/13/14  0528  WBC  5.3  6.3  6.2  HGB  7.9*  8.0*  8.0*  HCT  24.8*  25.8*  26.1*  PLT  71*  88*  96*   BMET Recent Labs     04/11/14  0448  04/12/14  0500  04/13/14  0528  NA  145  144  145  K  3.8  3.5  3.2*  CL  111  112  113*  CO2  24  25  26   BUN  83*  80*  78*  CREATININE  1.80*  1.58*  1.59*  GLUCOSE  197*  186*  169*    Electrolytes Recent Labs     04/11/14  0448  04/12/14  0500  04/13/14  0528  CALCIUM  7.8*  7.9*  8.0*   Liver Enzymes Recent Labs     04/13/14  0528  AST  37  ALT  54*  ALKPHOS  63  BILITOT  0.7  ALBUMIN  1.7*   Glucose Recent  Labs     04/12/14  1256  04/12/14  1553  04/12/14  2027  04/12/14  2350  04/13/14  0346  04/13/14  0754  GLUCAP  137*  151*  164*  146*  161*  137*    ASSESSMENT / PLAN:  ETT 1/03 >> Rt IJ CVL 1/03 >>  Impression: Acute hypoxic/hypercapnic respiratory failure E coli UTI MRSA Pneumonia PEA cardiac arrest Ischemic cardiomyopathy with acute on chronic systolic heart failure Pleural effusion Paroxysmal atrial fibrillation Hx of HTN Hyperkalemia CKD stage 3 Acute kidney injury Protein calorie malnutrition Anemia of critical illness Thrombocytopenia Hx of CVA DM type II with renal complications Anoxic encephalopathy Persistent vegetative state  Plan: DNR Continue vent support until family ready to transition to comfort measures D/c Abx 1/11 Defer further lab testing, imaging studies D/c SSI, CBG checks Change to PRN nebs PRN tylenol PRN ativan, morphine  Coralyn Helling, MD [facility]  Pulmonary/Critical Care 04/13/2014, 9:55 AM Pager:  [phone] After 3pm call: [phone]

## 2014-04-14 MED ORDER — MORPHINE BOLUS VIA INFUSION
5.0000 mg | INTRAVENOUS | Status: DC | PRN
  Filled 2014-04-14: qty 20

## 2014-04-14 MED ORDER — LORAZEPAM BOLUS VIA INFUSION
2.0000 mg | INTRAVENOUS | Status: DC | PRN
Start: 2014-04-14 — End: 2014-04-15
  Filled 2014-04-14: qty 5

## 2014-04-14 MED ORDER — LORAZEPAM 2 MG/ML IJ SOLN
5.0000 mg/h | INTRAVENOUS | Status: DC
  Administered 2014-04-14 (×2): 5 mg/h via INTRAVENOUS
  Filled 2014-04-14 (×3): qty 25

## 2014-04-14 MED ORDER — DEXTROSE 5 % IV SOLN
10.0000 mg/h | INTRAVENOUS | Status: DC
  Administered 2014-04-14: 7 mg/h via INTRAVENOUS
  Filled 2014-04-14 (×2): qty 10

## 2014-04-14 MED ORDER — DEXTROSE 5 % IV SOLN
INTRAVENOUS | Status: DC
  Administered 2014-04-14: 20 mL via INTRAVENOUS

## 2014-04-14 NOTE — Progress Notes (Signed)
eLink Physician-Brief Progress Note Patient Name: [name] DOB: Oct 30, 1933 MRN: [id number]   Date of Service  04/14/2014  HPI/Events of Note  Comfort measures  eICU Interventions  Transfer out of ICU     Intervention Category Minor Interventions: Routine modifications to care plan (e.g. PRN medications for pain, fever)  MCQUAID, DOUGLAS 04/14/2014, 11:37 PM

## 2014-04-14 NOTE — Progress Notes (Signed)
RT removed vent per withdrawal of life order per MD. Positive for cuff leak. Patient placed on 2 Lpm nasal cannula. Patient resting comfortably. Family at bedside.

## 2014-04-14 NOTE — Progress Notes (Signed)
Assumed care of patient who was placed on comfort care per md's order at 1700. Patient seems to be comfortable with current medication. Emotional support given to family at bedside. Will continue to ensure patients comfort and provide emotional support to family.

## 2014-04-14 NOTE — Progress Notes (Signed)
CSW met with patient's wife, son, daughter and multiple family members this evening. Family has proceeded with comfort care and palliative wean from the vent this evening.  Son and other family members verbalize that they are at peace with this decision- however, family is extremely concerned that patient did not have any life or burial insurance. Family requested CSWs involvement to determine possible assistance with burial.  CSW discussed various options know such as cremation (family is ADAMANTLY against this) or talking to patient's Doristine Bosworth to determine if any church funds would be available.  CSW provided family with a list of local funeral homes and discussed that they could call them to determine if any would help with indigent burial.  Daughter Silva Bandy asked about obtaining help via [employer]. CSW investigated and was informed that they no longer assist with burial.  Patient is not a veteran nor did he have a prepaid burial.  [place] contacted [employer], [employer] [place] [employer] to determine if there might be any other resource.  Also discussed with current on-call Conrad who has been providing support to to this family. Family remains adamant not to seek cremation and wants to provide full burial for patient- even if only providing grave side services.  CSW provided support; was unable to assist further. Patient's pastor arrived and will provide continued support to the family.  Comfort care is in place.  Lorie Phenix. Pauline Good, [place]

## 2014-04-14 NOTE — Progress Notes (Signed)
Responded to page from unit nurse to provide emotional and spiritual support to family at bedside.  Patient is actively dying and family have decided to withdraw life support and requested chaplain presence.  Family is still hoping for miracle. I join with them in their bedside faith ritual and prayed with  family per their request.  I listened empathetically and supported them as they searched  for meaning, and addressed their fears and hopes. .Family wanted to spend the remaining time along with love one.

## 2014-04-14 NOTE — Progress Notes (Signed)
PULMONARY / CRITICAL CARE MEDICINE  81 yo male SNF pt w/ witnessed cardiac arrest with immediate CPR , initial PEA noted , 25 min CPR w/ 3 epi prior to ER, ROS w/ junctional rhythm . PCCM to admit.  He was started on hypothermia protocol.  He was evaluated by cardiology.  He was found to have MRSA PNA and E coli UTI >> he was tx with Abx for these.  Upon rewarming he did not regain his mental status.  CT head showed old infarcts and encephalomalacia.  His EEG showed diffuse slowing.  His echo showed systolic and diastolic dysfunction.  He was evaluated by neurology.  He was started on keppra for possible sub clinical seizure activity.  He did not have clinical improvement in his mental status.  This was discussed with family and he was made DNR.  SUBJECTIVE: Unresponsive.  VITAL SIGNS: Temp:  [97 F (36.1 C)-98.7 F (37.1 C)] 98.4 F (36.9 C) (01/12 0400) Pulse Rate:  [59-74] 70 (01/12 0725) Resp:  [14-33] 17 (01/12 0725) BP: (148-167)/(46-63) 155/46 mmHg (01/12 0725) SpO2:  [100 %] 100 % (01/12 0725) FiO2 (%):  [30 %] 30 % (01/12 0725) VENTILATOR SETTINGS: Vent Mode:  [-] PSV;CPAP FiO2 (%):  [30 %] 30 % Set Rate:  [14 bmp] 14 bmp Vt Set:  [500 mL] 500 mL PEEP:  [5 cmH20] 5 cmH20 Pressure Support:  [8 cmH20-10 cmH20] 10 cmH20 Plateau Pressure:  [19 cmH20-20 cmH20] 19 cmH20 INTAKE / OUTPUT:  Intake/Output Summary (Last 24 hours) at 04/14/14 0744 Last data filed at 04/14/14 0600  Gross per 24 hour  Intake   2190 ml  Output   1750 ml  Net    440 ml    PHYSICAL EXAMINATION: General: unresponsive Neuro: does not follow commands HEENT: pupils reactive, ETT in place Cardiovascular: regular Lungs: scattered rhonchi Abdomen:  Soft  Ext: b/l heel ulcers Skin: multiple areas of ecchymosis  CBC Recent Labs     04/11/14  1400  04/12/14  0500  04/13/14  0528  WBC  5.3  6.3  6.2  HGB  7.9*  8.0*  8.0*  HCT  24.8*  25.8*  26.1*  PLT  71*  88*  96*   BMET Recent Labs   04/12/14  0500  04/13/14  0528  NA  144  145  K  3.5  3.2*  CL  112  113*  CO2  25  26  BUN  80*  78*  CREATININE  1.58*  1.59*  GLUCOSE  186*  169*    Electrolytes Recent Labs     04/12/14  0500  04/13/14  0528  CALCIUM  7.9*  8.0*   Liver Enzymes Recent Labs     04/13/14  0528  AST  37  ALT  54*  ALKPHOS  63  BILITOT  0.7  ALBUMIN  1.7*   Glucose Recent Labs     04/12/14  1553  04/12/14  2027  04/12/14  2350  04/13/14  0346  04/13/14  0754  04/13/14  1139  GLUCAP  151*  164*  146*  161*  137*  122*    ASSESSMENT / PLAN:  ETT 1/03 >> Rt IJ CVL 1/03 >>  Impression: Acute hypoxic/hypercapnic respiratory failure E coli UTI MRSA Pneumonia PEA cardiac arrest Ischemic cardiomyopathy with acute on chronic systolic heart failure Pleural effusion Paroxysmal atrial fibrillation Hx of HTN Hyperkalemia CKD stage 3 Acute kidney injury Protein calorie malnutrition Anemia of critical illness Thrombocytopenia  Hx of CVA DM type II with renal complications Anoxic encephalopathy Persistent vegetative state  Plan: DNR Continue vent support until family ready to transition to comfort measures Change to PRN nebs PRN tylenol PRN ativan, morphine  Summary: He will not likely regain his mental status.  Family deciding about whether to withdraw vent support.    Coralyn Helling, MD [facility] Pulmonary/Critical Care 04/14/2014, 7:44 AM Pager:  [phone] After 3pm call: [phone]

## 2014-04-14 NOTE — Progress Notes (Signed)
Withdrawal of care  Discussed with multiple family members w/d of care.  They agree with w/d of care measures.  Will order w/d of care order set and Chaplain.  Family to be at bedside until patient expires.    Shirlee Latch MD

## 2014-04-15 ENCOUNTER — Encounter: Payer: Self-pay | Admitting: Vascular Surgery

## 2014-04-15 DIAGNOSIS — J9601 Acute respiratory failure with hypoxia: Secondary | ICD-10-CM

## 2014-04-16 ENCOUNTER — Other Ambulatory Visit (HOSPITAL_COMMUNITY): Payer: Medicare Other

## 2014-04-16 ENCOUNTER — Ambulatory Visit: Payer: Medicare Other | Admitting: Vascular Surgery

## 2014-05-01 NOTE — Discharge Summary (Signed)
PCCM DEATH SUMMARY  DATE OF ADMISSION: 05/05/14  DATE OF DEATH: 05-15-2014  FINAL CAUSE OF DEATH Anoxic encephalopathy  SECONDARY CAUSES OF DEATH Acute hypoxic/hypercapnic respiratory failure E coli UTI MRSA Pneumonia PEA cardiac arrest Ischemic cardiomyopathy with acute on chronic systolic and diastolic heart failure Pleural effusion Paroxysmal atrial fibrillation Hx of HTN Hyperkalemia CKD stage 3 Acute kidney injury Protein calorie malnutrition Anemia of critical illness Thrombocytopenia Hx of CVA DM type II with renal complications Persistent vegetative state Comfort care  HOSPITAL COURSE:  81 yo male SNF pt w/ witnessed cardiac arrest with immediate CPR , initial PEA noted , 25 min CPR w/ 3 epi prior to ER, ROS w/ junctional rhythm . PCCM to admit.  He was started on hypothermia protocol.  He was evaluated by cardiology.  He was found to have MRSA PNA and E coli UTI >> he was tx with Abx for these.  Upon rewarming he did not regain his mental status.  CT head showed old infarcts and encephalomalacia.  His EEG showed diffuse slowing.  His echo showed systolic and diastolic dysfunction.  He was evaluated by neurology.  He was started on keppra for possible sub clinical seizure activity.  He did not have clinical improvement in his mental status.  This was discussed with family and he was made DNR. He was extubated and expired on 05-15-14.   Levy Pupa, MD, PhD May 31, 2014, 6:14 PM [place] [facility] [phone] or if no answer [phone]

## 2014-05-04 NOTE — Progress Notes (Signed)
PULMONARY / CRITICAL CARE MEDICINE  81 yo male SNF pt w/ witnessed cardiac arrest with immediate CPR , initial PEA noted , 25 min CPR w/ 3 epi prior to ER, ROS w/ junctional rhythm . PCCM to admit.  He was started on hypothermia protocol.  He was evaluated by cardiology.  He was found to have MRSA PNA and E coli UTI >> he was tx with Abx for these.  Upon rewarming he did not regain his mental status.  CT head showed old infarcts and encephalomalacia.  His EEG showed diffuse slowing.  His echo showed systolic and diastolic dysfunction.  He was evaluated by neurology.  He was started on keppra for possible sub clinical seizure activity.  He did not have clinical improvement in his mental status.  This was discussed with family and he was made DNR.  SUBJECTIVE: Unresponsive.  VITAL SIGNS: Temp:  [98.1 F (36.7 C)] 98.1 F (36.7 C) (01/12 1200) Pulse Rate:  [47-66] 47 (01/12 2135) Resp:  [7-23] 9 (01/12 2135) BP: (97-159)/(30-51) 97/32 mmHg (01/12 2135) SpO2:  [99 %-100 %] 99 % (01/12 2135) FiO2 (%):  [30 %] 30 % (01/12 1107) VENTILATOR SETTINGS: Vent Mode:  [-] PSV;CPAP FiO2 (%):  [30 %] 30 % PEEP:  [5 cmH20] 5 cmH20 Pressure Support:  [10 cmH20] 10 cmH20 INTAKE / OUTPUT:  Intake/Output Summary (Last 24 hours) at 04/29/2014 1610 Last data filed at 04/04/2014 0600  Gross per 24 hour  Intake    805 ml  Output    625 ml  Net    180 ml    PHYSICAL EXAMINATION: General: unresponsive, agonal breathing. Neuro: does not follow commands HEENT: pupils reactive Cardiovascular: regular Lungs: scattered rhonchi, rr 5 b/m Abdomen:  Soft  Ext: b/l heel ulcers Skin: multiple areas of ecchymosis  CBC Recent Labs     04/13/14  0528  WBC  6.2  HGB  8.0*  HCT  26.1*  PLT  96*   BMET Recent Labs     04/13/14  0528  NA  145  K  3.2*  CL  113*  CO2  26  BUN  78*  CREATININE  1.59*  GLUCOSE  169*    Electrolytes Recent Labs     04/13/14  0528  CALCIUM  8.0*   Liver  Enzymes Recent Labs     04/13/14  0528  AST  37  ALT  54*  ALKPHOS  63  BILITOT  0.7  ALBUMIN  1.7*   Glucose Recent Labs     04/12/14  1553  04/12/14  2027  04/12/14  2350  04/13/14  0346  04/13/14  0754  04/13/14  1139  GLUCAP  151*  164*  146*  161*  137*  122*    ASSESSMENT / PLAN:  ETT 1/03 >>1/12 Rt IJ CVL 1/03 >>  Impression: Acute hypoxic/hypercapnic respiratory failure E coli UTI MRSA Pneumonia PEA cardiac arrest Ischemic cardiomyopathy with acute on chronic systolic heart failure Pleural effusion Paroxysmal atrial fibrillation Hx of HTN Hyperkalemia CKD stage 3 Acute kidney injury Protein calorie malnutrition Anemia of critical illness Thrombocytopenia Hx of CVA DM type II with renal complications Anoxic encephalopathy Persistent vegetative state Comfort care  Plan: DNR  comfort measures Change to PRN nebs Morphine drip  Summary: Extubated, agonal breathing, morphine drip, life forces weak.  Brett Canales Minor ACNP [facility] PCCM Pager [phone] till 3 pm If no answer page [phone] 04/24/2014, 9:08 AM    Pt expired before I saw  him. No charge for this encounter.   Levy Pupa, MD, PhD 04/12/2014, 3:17 PM [place] [facility] [phone] or if no answer [phone]

## 2014-05-04 NOTE — Progress Notes (Signed)
Removed flexiseal & central line. Foley removed via [place], [place].

## 2014-05-04 NOTE — Progress Notes (Signed)
Patient has been removed from the ventilator and has been transferred to 6E15 for comfort care.  Handoff report provided to receiving CSW- Genelle Bal, LCSW. Family has all resource information available re: burial options.  This CSW will sign off.  Lorri Frederick. Jaci Lazier, [place] [phone]

## 2014-05-04 NOTE — Progress Notes (Signed)
Nutrition Brief Note  Chart reviewed. Pt now transitioning to comfort care.  No further nutrition interventions warranted at this time.  Please re-consult as needed.   Keane Martelli La, MS, RD, LDN Pager # [phone] After hours/ weekend pager # [phone]   

## 2014-05-04 NOTE — Progress Notes (Signed)
5 mL of Ativan and 145 mL of morphine wasted in sink, witnessed by: Sharen Heck, RN & Terrance Mass, RN.

## 2014-05-04 NOTE — Progress Notes (Signed)
Chaplain paged to be with family grieving pt who recently passed. Pt's wife, one of his sons and that son's wife are present at bedside.   Chaplain facilitated story-telling, his son being the most expressive. Son expressed that family was expecting to be notified so they could be with him before he passed and stated that did not happen. Son is wrestling with some anger, grief, and guilt.   [name] wife is weeping, she is not able to speak as she is heavily grieving.   [name] has 5 adult children and multiple grandchildren.   Chaplain prayed with family and supported them in their grief.   Page if needed again.  [name], Chaplain 04/25/2014

## 2014-05-04 NOTE — Progress Notes (Signed)
Patient transferred to the unit to room 15. Patient is unresponsive with agonal breathing. 5-6 BPM. Oxygen continues at 2 LPM. No sign and symptom of pain noted. Will keep comfortable. Flexiseal and foley cath intact.

## 2014-05-04 DEATH — deceased

## 2014-09-18 ENCOUNTER — Encounter: Payer: Self-pay | Admitting: *Deleted

## 2015-02-24 IMAGING — CR DG CHEST 2V
2 series · 2 of 2 positions shown · non-contrast
Comparison: 11/20/2011

CLINICAL DATA: Followup effusions.

EXAM:
CHEST  2 VIEW

[x chest ap]
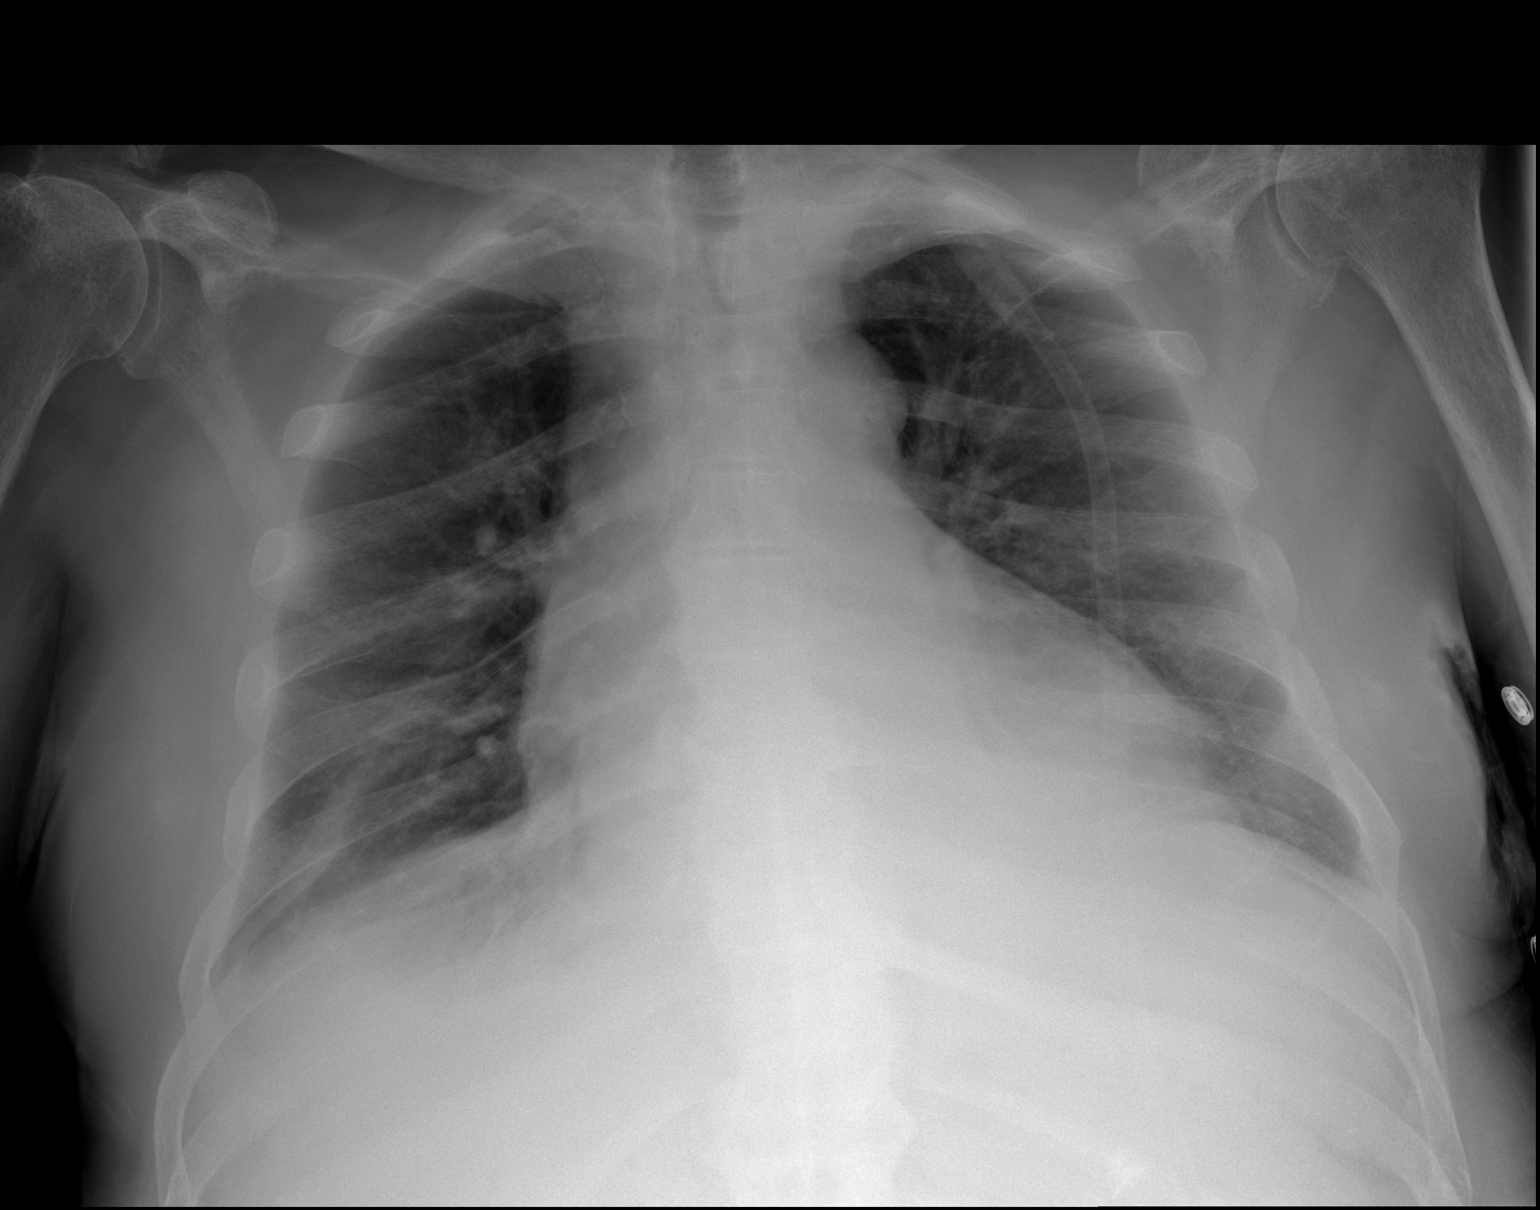

[w chest lat]
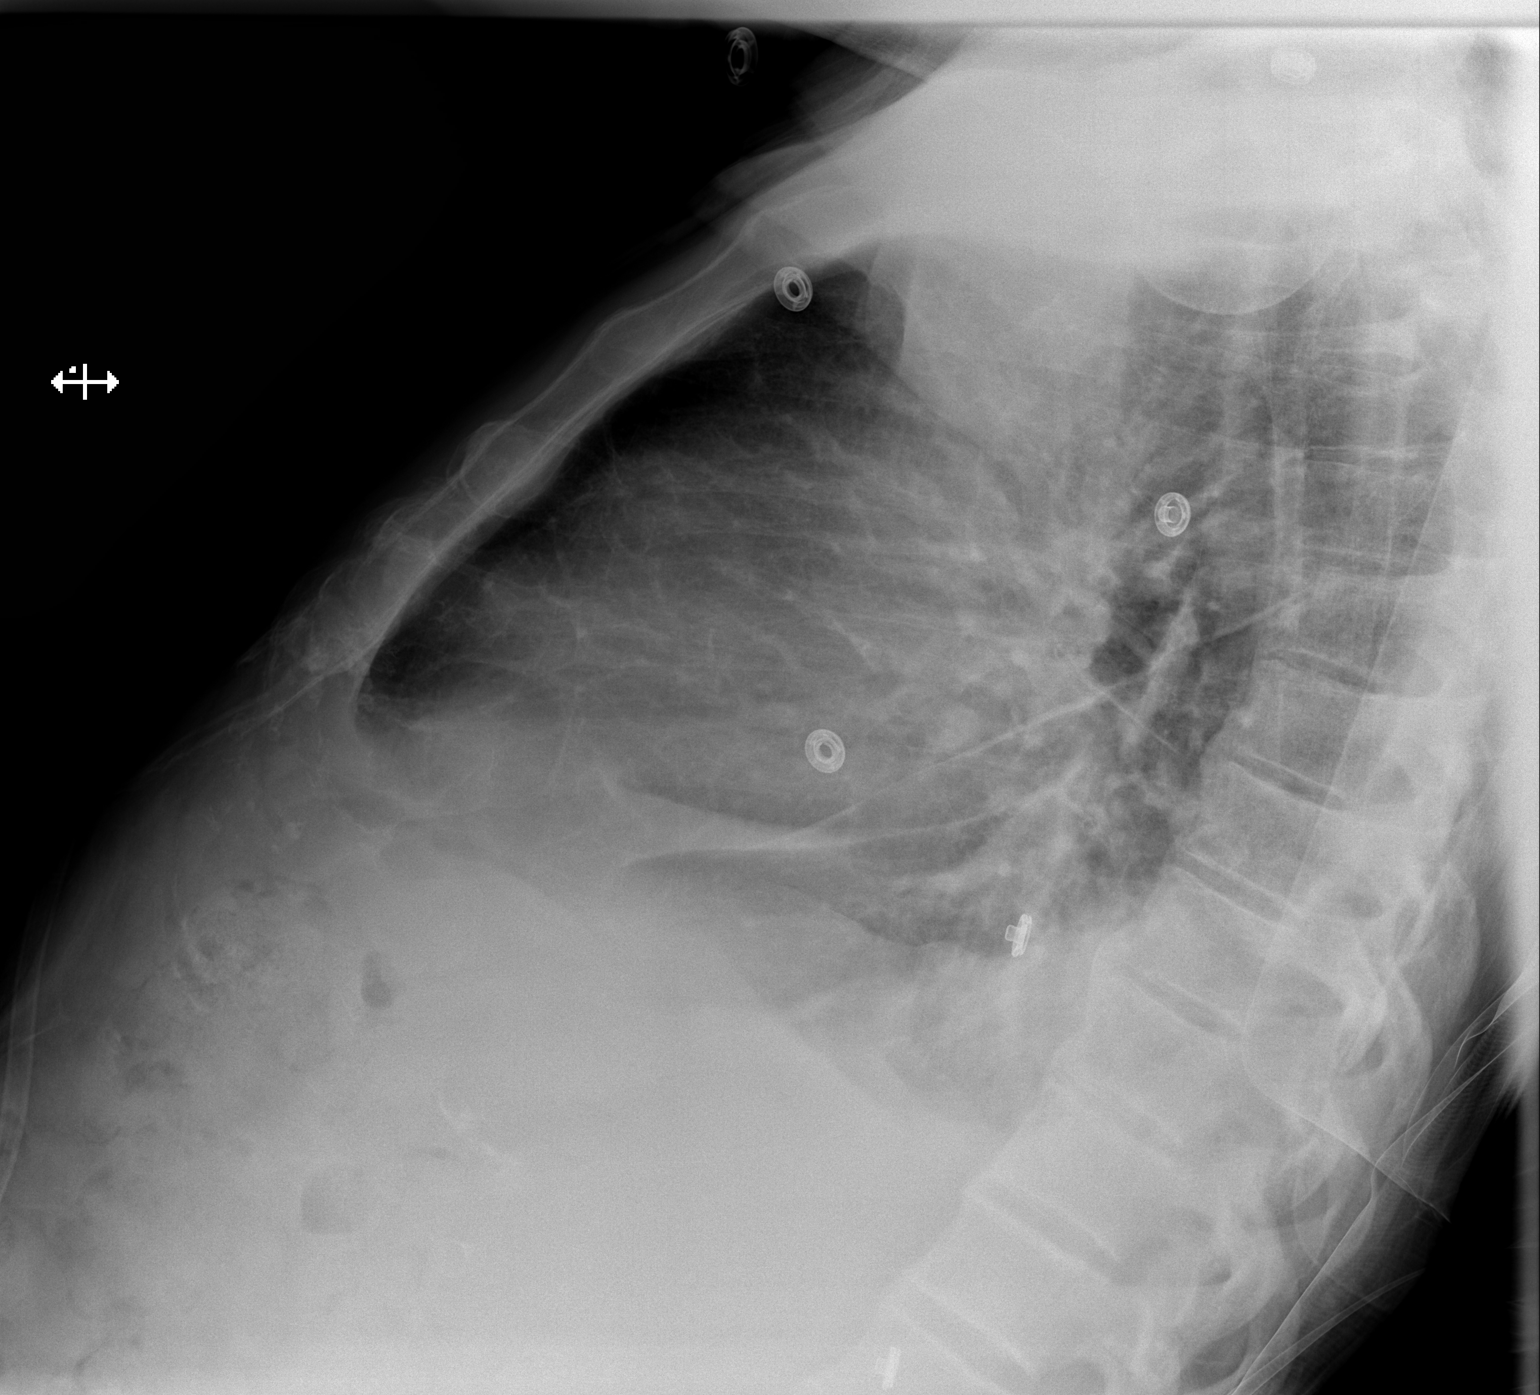

[2 of 2 positions shown; findings below may reference images not displayed]

FINDINGS: Hypoinflation. Bibasilar opacification best seen posteriorly on the
lateral film suggestive persistent small effusions left greater than
right with associated atelectasis as findings are slightly improved
based on the frontal exam. Mild cardiomegaly likely due in part to
hypoinflation and patient positioning. Remainder of the exam is
unchanged.
IMPRESSION: Persistent bibasilar opacification likely small effusions with
atelectasis demonstrating slight interval improvement.

## 2015-07-13 IMAGING — CR DG CHEST 1V PORT
1 series · 1 of 1 positions shown · non-contrast
Comparison: Portable chest x-ray Monday April, 2014 and April 05, 2014

CLINICAL DATA: Respiratory failure, shortness of breath

EXAM:
PORTABLE CHEST - 1 VIEW

[AP]
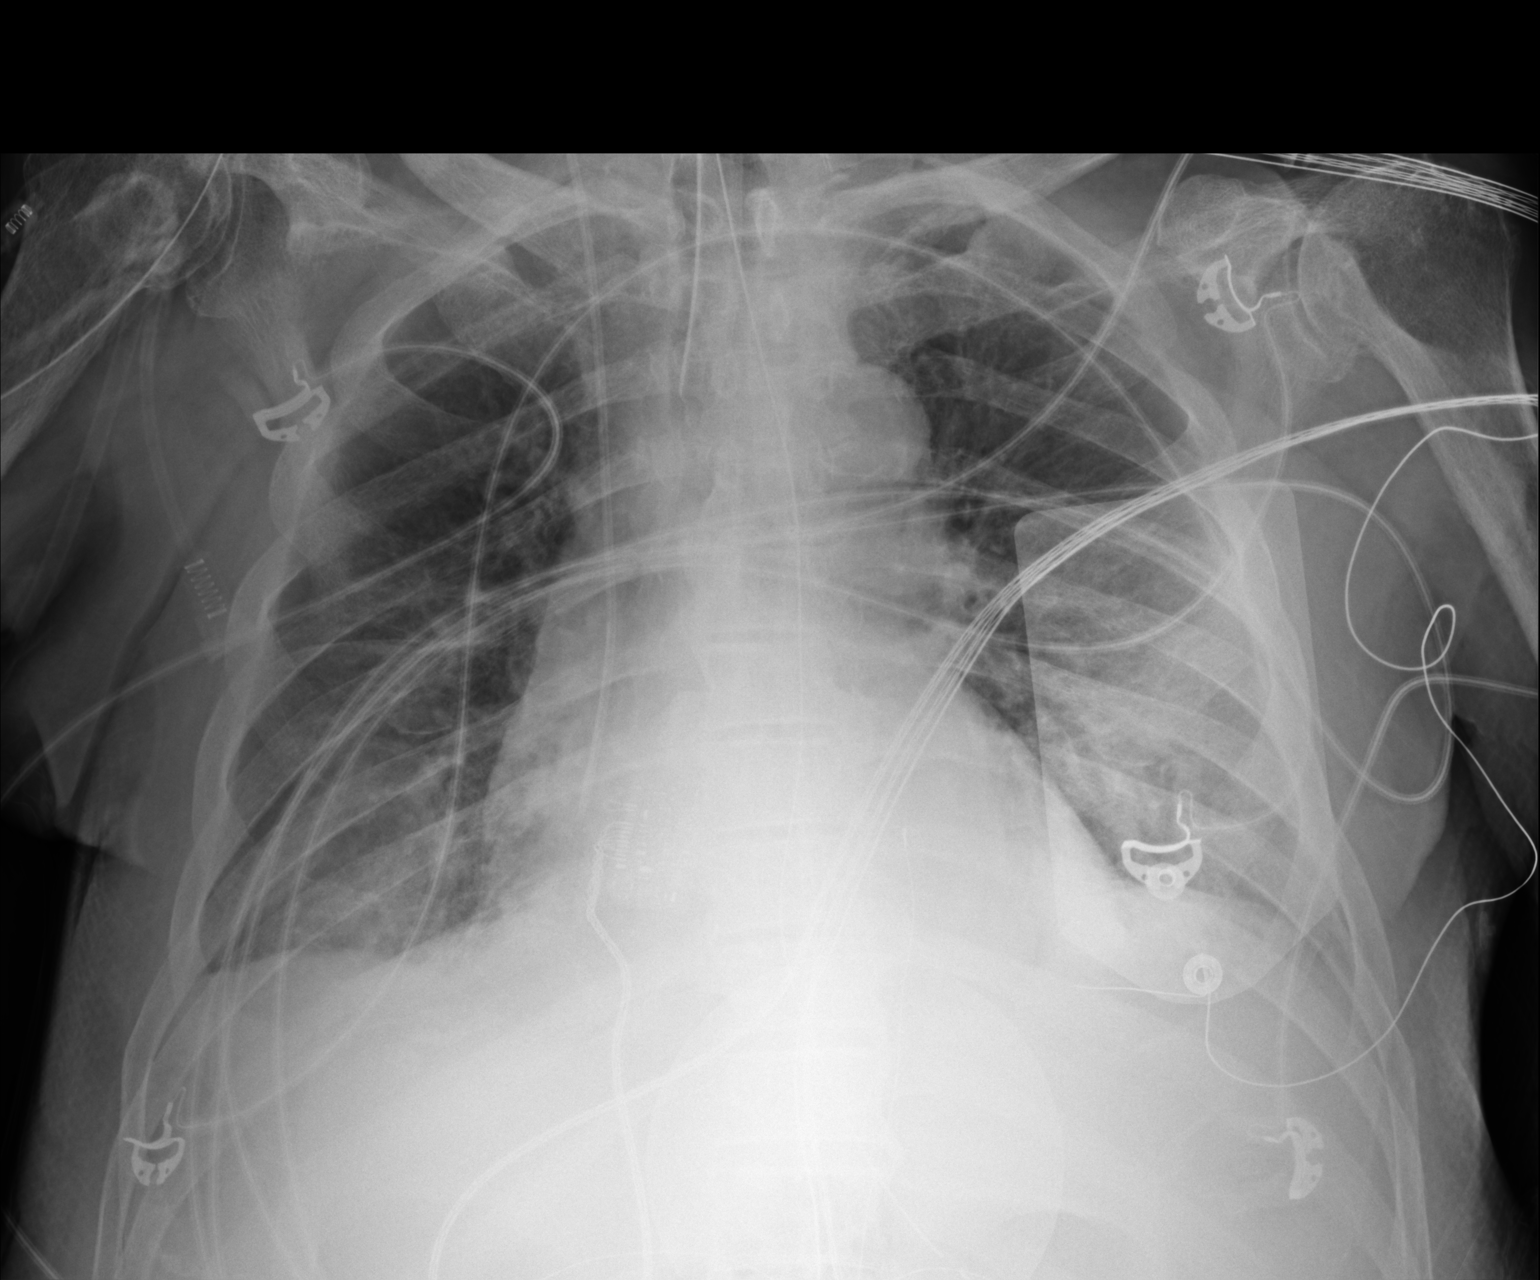

[1 of 1 positions shown; findings below may reference images not displayed]

FINDINGS: The lungs are adequately inflated. There are patchy increased
densities at both bases consistent with subsegmental atelectasis.
The cardiac silhouette is mildly enlarged. The pulmonary vascularity
is not engorged. External pacemaker defibrillator pads are present.
The endotracheal tube tip projects 3.8 cm above the crotch of the
carina. The esophagogastric tube tip projects below the inferior
margin of the image. The right internal jugular venous catheter tip
projects over the cavoatrial junction.
IMPRESSION: There has been further interval improvement in the appearance of the
lung bases. Minimal subsegmental atelectasis persists. There is no
significant pleural effusion. The support tubes and lines are in
reasonable position radiographically.
